# Patient Record
Sex: Male | Born: 1940
Health system: Southern US, Community
[De-identification: ages and names within clinical notes are randomized; demographics above are authoritative.]

## PROBLEM LIST (undated history)

## (undated) DIAGNOSIS — N200 Calculus of kidney: Secondary | ICD-10-CM

## (undated) DIAGNOSIS — G8929 Other chronic pain: Secondary | ICD-10-CM

## (undated) DIAGNOSIS — E785 Hyperlipidemia, unspecified: Secondary | ICD-10-CM

## (undated) DIAGNOSIS — R519 Headache, unspecified: Secondary | ICD-10-CM

## (undated) DIAGNOSIS — Z973 Presence of spectacles and contact lenses: Secondary | ICD-10-CM

## (undated) DIAGNOSIS — I2510 Atherosclerotic heart disease of native coronary artery without angina pectoris: Secondary | ICD-10-CM

## (undated) DIAGNOSIS — G20C Parkinsonism, unspecified: Secondary | ICD-10-CM

## (undated) DIAGNOSIS — M1990 Unspecified osteoarthritis, unspecified site: Secondary | ICD-10-CM

## (undated) DIAGNOSIS — K635 Polyp of colon: Secondary | ICD-10-CM

## (undated) DIAGNOSIS — R351 Nocturia: Secondary | ICD-10-CM

## (undated) DIAGNOSIS — G20A1 Parkinson's disease without dyskinesia, without mention of fluctuations: Secondary | ICD-10-CM

## (undated) DIAGNOSIS — Z974 Presence of external hearing-aid: Secondary | ICD-10-CM

## (undated) DIAGNOSIS — N400 Enlarged prostate without lower urinary tract symptoms: Secondary | ICD-10-CM

## (undated) DIAGNOSIS — R918 Other nonspecific abnormal finding of lung field: Secondary | ICD-10-CM

## (undated) DIAGNOSIS — N5231 Erectile dysfunction following radical prostatectomy: Secondary | ICD-10-CM

## (undated) DIAGNOSIS — Z8546 Personal history of malignant neoplasm of prostate: Secondary | ICD-10-CM

## (undated) DIAGNOSIS — R140 Abdominal distension (gaseous): Secondary | ICD-10-CM

## (undated) DIAGNOSIS — F482 Pseudobulbar affect: Secondary | ICD-10-CM

## (undated) DIAGNOSIS — K7581 Nonalcoholic steatohepatitis (NASH): Secondary | ICD-10-CM

## (undated) DIAGNOSIS — B409 Blastomycosis, unspecified: Secondary | ICD-10-CM

## (undated) DIAGNOSIS — I10 Essential (primary) hypertension: Secondary | ICD-10-CM

## (undated) DIAGNOSIS — K824 Cholesterolosis of gallbladder: Secondary | ICD-10-CM

## (undated) DIAGNOSIS — N201 Calculus of ureter: Secondary | ICD-10-CM

## (undated) DIAGNOSIS — N529 Male erectile dysfunction, unspecified: Secondary | ICD-10-CM

## (undated) DIAGNOSIS — N182 Chronic kidney disease, stage 2 (mild): Secondary | ICD-10-CM

## (undated) DIAGNOSIS — Z87442 Personal history of urinary calculi: Secondary | ICD-10-CM

## (undated) DIAGNOSIS — K5730 Diverticulosis of large intestine without perforation or abscess without bleeding: Secondary | ICD-10-CM

## (undated) DIAGNOSIS — K8020 Calculus of gallbladder without cholecystitis without obstruction: Secondary | ICD-10-CM

## (undated) DIAGNOSIS — N2889 Other specified disorders of kidney and ureter: Secondary | ICD-10-CM

## (undated) DIAGNOSIS — G3185 Corticobasal degeneration: Secondary | ICD-10-CM

## (undated) DIAGNOSIS — F339 Major depressive disorder, recurrent, unspecified: Secondary | ICD-10-CM

## (undated) DIAGNOSIS — H9319 Tinnitus, unspecified ear: Secondary | ICD-10-CM

## (undated) DIAGNOSIS — IMO0002 Reserved for concepts with insufficient information to code with codable children: Secondary | ICD-10-CM

## (undated) DIAGNOSIS — M4302 Spondylolysis, cervical region: Secondary | ICD-10-CM

## (undated) HISTORY — DX: Corticobasal degeneration: G31.85

## (undated) HISTORY — DX: Hyperlipidemia, unspecified: E78.5

## (undated) HISTORY — DX: Pseudobulbar affect: F48.2

## (undated) HISTORY — DX: Major depressive disorder, recurrent, unspecified: F33.9

## (undated) HISTORY — PX: LOBECTOMY: SHX5089

## (undated) HISTORY — DX: Parkinson's disease without dyskinesia, without mention of fluctuations: G20.A1

## (undated) HISTORY — DX: Calculus of ureter: N20.1

## (undated) HISTORY — DX: Benign prostatic hyperplasia without lower urinary tract symptoms: N40.0

## (undated) HISTORY — PX: CARDIAC CATHETERIZATION: SHX172

## (undated) HISTORY — DX: Cholesterolosis of gallbladder: K82.4

## (undated) HISTORY — DX: Other specified disorders of kidney and ureter: N28.89

## (undated) HISTORY — DX: Chronic kidney disease, stage 2 (mild): N18.2

## (undated) HISTORY — DX: Blastomycosis, unspecified: B40.9

## (undated) HISTORY — PX: BACK SURGERY: SHX140

## (undated) HISTORY — DX: Parkinsonism, unspecified: G20.C

## (undated) HISTORY — DX: Male erectile dysfunction, unspecified: N52.9

## (undated) HISTORY — DX: Diverticulosis of large intestine without perforation or abscess without bleeding: K57.30

## (undated) HISTORY — DX: Essential (primary) hypertension: I10

## (undated) HISTORY — DX: Nonalcoholic steatohepatitis (NASH): K75.81

## (undated) HISTORY — DX: Reserved for concepts with insufficient information to code with codable children: IMO0002

## (undated) HISTORY — DX: Personal history of malignant neoplasm of prostate: Z85.46

## (undated) HISTORY — DX: Calculus of gallbladder without cholecystitis without obstruction: K80.20

## (undated) HISTORY — PX: COLONOSCOPY: SHX174

## (undated) HISTORY — PX: CORONARY STENT PLACEMENT: SHX1402

## (undated) HISTORY — DX: Other nonspecific abnormal finding of lung field: R91.8

## (undated) HISTORY — DX: Calculus of kidney: N20.0

## (undated) HISTORY — DX: Atherosclerotic heart disease of native coronary artery without angina pectoris: I25.10

## (undated) HISTORY — DX: Unspecified osteoarthritis, unspecified site: M19.90

## (undated) HISTORY — DX: Polyp of colon: K63.5

---
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
## 1975-05-08 DIAGNOSIS — B409 Blastomycosis, unspecified: Secondary | ICD-10-CM

## 1975-05-08 HISTORY — DX: Blastomycosis, unspecified: B40.9

## 1976-05-07 DIAGNOSIS — Z8619 Personal history of other infectious and parasitic diseases: Secondary | ICD-10-CM

## 1976-05-07 HISTORY — PX: LUMBAR SPINE SURGERY: SHX701

## 1976-05-07 HISTORY — PX: LUNG LOBECTOMY: SHX167

## 1976-05-07 HISTORY — DX: Personal history of other infectious and parasitic diseases: Z86.19

## 1980-05-07 HISTORY — PX: ANTERIOR CERVICAL DECOMP/DISCECTOMY FUSION: SHX1161

## 1997-05-07 DIAGNOSIS — I2510 Atherosclerotic heart disease of native coronary artery without angina pectoris: Secondary | ICD-10-CM

## 1997-05-07 DIAGNOSIS — Z955 Presence of coronary angioplasty implant and graft: Secondary | ICD-10-CM

## 1997-05-07 HISTORY — DX: Presence of coronary angioplasty implant and graft: Z95.5

## 1997-05-07 HISTORY — DX: Atherosclerotic heart disease of native coronary artery without angina pectoris: I25.10

## 1997-05-07 HISTORY — PX: CORONARY STENT PLACEMENT: SHX1402

## 1997-11-26 ENCOUNTER — Ambulatory Visit (HOSPITAL_COMMUNITY): Admission: RE | Admit: 1997-11-26 | Discharge: 1997-11-27 | Payer: Self-pay | Admitting: Cardiology

## 2000-02-26 ENCOUNTER — Inpatient Hospital Stay (HOSPITAL_COMMUNITY): Admission: EM | Admit: 2000-02-26 | Discharge: 2000-02-28 | Payer: Self-pay | Admitting: Emergency Medicine

## 2000-02-26 ENCOUNTER — Encounter: Payer: Self-pay | Admitting: Emergency Medicine

## 2000-02-27 HISTORY — PX: CARDIAC CATHETERIZATION: SHX172

## 2002-02-26 ENCOUNTER — Encounter: Payer: Self-pay | Admitting: Family Medicine

## 2003-01-12 ENCOUNTER — Encounter: Payer: Self-pay | Admitting: Family Medicine

## 2003-02-11 ENCOUNTER — Encounter: Payer: Self-pay | Admitting: Family Medicine

## 2005-05-07 HISTORY — PX: ROTATOR CUFF REPAIR: SHX139

## 2007-12-22 ENCOUNTER — Ambulatory Visit: Payer: Self-pay | Admitting: Cardiology

## 2007-12-22 ENCOUNTER — Inpatient Hospital Stay (HOSPITAL_COMMUNITY): Admission: EM | Admit: 2007-12-22 | Discharge: 2007-12-24 | Payer: Self-pay | Admitting: Emergency Medicine

## 2007-12-22 DIAGNOSIS — I2510 Atherosclerotic heart disease of native coronary artery without angina pectoris: Secondary | ICD-10-CM

## 2007-12-22 HISTORY — DX: Atherosclerotic heart disease of native coronary artery without angina pectoris: I25.10

## 2007-12-23 HISTORY — PX: CORONARY ANGIOPLASTY WITH STENT PLACEMENT: SHX49

## 2008-01-07 ENCOUNTER — Ambulatory Visit: Payer: Self-pay | Admitting: Cardiology

## 2008-04-14 ENCOUNTER — Ambulatory Visit: Payer: Self-pay | Admitting: Cardiology

## 2008-11-06 DIAGNOSIS — I2510 Atherosclerotic heart disease of native coronary artery without angina pectoris: Secondary | ICD-10-CM

## 2008-11-06 DIAGNOSIS — E785 Hyperlipidemia, unspecified: Secondary | ICD-10-CM | POA: Insufficient documentation

## 2008-11-06 DIAGNOSIS — IMO0002 Reserved for concepts with insufficient information to code with codable children: Secondary | ICD-10-CM

## 2008-11-10 ENCOUNTER — Ambulatory Visit: Payer: Self-pay | Admitting: Cardiology

## 2009-07-26 ENCOUNTER — Encounter: Payer: Self-pay | Admitting: Cardiology

## 2009-07-26 ENCOUNTER — Encounter: Payer: Self-pay | Admitting: Family Medicine

## 2009-08-17 ENCOUNTER — Ambulatory Visit (HOSPITAL_COMMUNITY): Admission: RE | Admit: 2009-08-17 | Discharge: 2009-08-17 | Payer: Self-pay | Admitting: Orthopedic Surgery

## 2009-12-21 ENCOUNTER — Encounter: Payer: Self-pay | Admitting: Cardiology

## 2010-01-11 ENCOUNTER — Ambulatory Visit: Payer: Self-pay | Admitting: Cardiology

## 2010-02-01 ENCOUNTER — Encounter: Payer: Self-pay | Admitting: Cardiology

## 2010-02-27 ENCOUNTER — Encounter: Payer: Self-pay | Admitting: Cardiology

## 2010-04-11 ENCOUNTER — Ambulatory Visit: Payer: Self-pay | Admitting: Family Medicine

## 2010-04-11 ENCOUNTER — Telehealth: Payer: Self-pay | Admitting: Family Medicine

## 2010-05-04 ENCOUNTER — Encounter: Payer: Self-pay | Admitting: Family Medicine

## 2010-05-07 HISTORY — PX: CARDIOVASCULAR STRESS TEST: SHX262

## 2010-05-15 ENCOUNTER — Encounter: Payer: Self-pay | Admitting: Family Medicine

## 2010-05-28 ENCOUNTER — Encounter: Payer: Self-pay | Admitting: Orthopedic Surgery

## 2010-06-06 NOTE — Letter (Signed)
Summary: House, [place] [address] [place]   [place], [place] [address]   Phone: [phone]  Fax: [phone]         February 27, 2010 MRN: [id number]   [facility] [place], [place]  [address]   Dear Mr. FALLER,  We have reviewed your cholesterol results.  They are as follows:     Total Cholesterol:   163  (Desirable: less than 200)       HDL  Cholesterol:     44  (Desirable: greater than 40 for men and 50 for women)       LDL Cholesterol:       79  (Desirable: less than 100 for low risk and less than 70 for moderate to high risk)       Triglycerides:       200  (Desirable: less than 150)  Our recommendations include:  Triglycerides are high.  Limit the fats in your diet as much as possible   Call our office at the number listed above if you have any questions.  Lowering your LDL cholesterol is important, but it is only one of a large number of "risk factors" that may indicate that you are at risk for heart disease, stroke or other complications of hardening of the arteries.  Other risk factors include:   A.  Cigarette Smoking* B.  High Blood Pressure* C.  Obesity* D.   Low HDL Cholesterol (see yours above)* E.   Diabetes Mellitus (higher risk if your is uncontrolled) F.  Family history of premature heart disease G.  Previous history of stroke or cardiovascular disease    *These are risk factors YOU HAVE CONTROL OVER.  For more information, visit .      There is now evidence that lowering the TOTAL CHOLESTEROL AND LDL CHOLESTEROL can reduce the risk of heart disease.  The American Heart Association recommends the following guidelines for the treatment of elevated cholesterol:  1.  If there is now current heart disease and less than two risk factors, TOTAL CHOLESTEROL should be less than 200 and LDL CHOLESTEROL should be less than 100. 2.  If there is current heart disease or two or more risk factors,  TOTAL CHOLESTEROL should be less than 200 and LDL CHOLESTEROL should be less than 70.  A diet low in cholesterol, saturated fat, and calories is the cornerstone of treatment for elevated cholesterol.  Cessation of smoking and exercise are also important in the management of elevated cholesterol and preventing vascular disease.  Studies have shown that 30 to 60 minutes of physical activity most days can help lower blood pressure, lower cholesterol, and keep your weight at a healthy level.  Drug therapy is used when cholesterol levels do not respond to therapeutic lifestyle changes (smoking cessation, diet, and exercise) and remains unacceptably high.  If medication is started, it is important to have you levels checked periodically to evaluate the need for further treatment options.    Thank you,     Vic Ripper, RN for Dr. Minus Breeding [facility] Team

## 2010-06-06 NOTE — Assessment & Plan Note (Signed)
Summary: [facility]  Medications Added PLAVIX 75 MG TABS (CLOPIDOGREL BISULFATE) 1 by mouth daliy LIPITOR 10 MG TABS (ATORVASTATIN CALCIUM) 1 by mouth daily CELEBREX 200 MG CAPS (CELECOXIB) as needed NIACIN 500 MG TABS (NIACIN) 1 by mouth daily CO Q-10 30 MG  CAPS (COENZYME Q10) 1 by mouth daily ASPIRIN 325 MG  TABS (ASPIRIN) 1 by mouth daily MULTIVITAMINS   TABS (MULTIPLE VITAMIN) 1 by mouth daily      Allergies Added: NKDA  Visit Type:  Follow-up Primary Provider:  Dr. Rowe Pavy  CC:  CAD.  History of Present Illness: The patient presents for one-year followup. Since I last saw him he has done quite well. He was walking 3 miles a day until he got some recent plantar fasciitis. 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 With this he denies any chest discomfort, neck or arm discomfort. He is not having any palpitations, presyncope or syncope. He is not having any PND or orthopnea or other shortness of breath. I reviewed lipids from March of this year and his LDL was 72 with an HDL of 46. He has had more recent labs but I have not seen these. He is tolerating the meds as listed.  Current Medications (verified): 1)  Plavix 75 Mg Tabs (Clopidogrel Bisulfate) .Marland Kitchen.. 1 By Mouth Daliy 2)  Lipitor 10 Mg Tabs (Atorvastatin Calcium) .Marland Kitchen.. 1 By Mouth Daily 3)  Celebrex 200 Mg Caps (Celecoxib) .... As Needed 4)  Niacin 500 Mg Tabs (Niacin) .Marland Kitchen.. 1 By Mouth Daily 5)  Co Q-10 30 Mg  Caps (Coenzyme Q10) .Marland Kitchen.. 1 By Mouth Daily 6)  Aspirin 325 Mg  Tabs (Aspirin) .Marland Kitchen.. 1 By Mouth Daily 7)  Multivitamins   Tabs (Multiple Vitamin) .Marland Kitchen.. 1 By Mouth Daily  Allergies (verified): No Known Drug Allergies  Past History:  Past Medical History:  Coronary artery disease (December 22, 2007, chest   pain.  Cardiac cath demonstrated 95% stenosis in the proximal portion of   the LAD.  There was a proximal stent with 10-20% restenosis.  The mid   LAD had 70% stenosis.  The diagonal had  80-90% stenosis.  The circumflex   had luminal irregularities.  Right coronary was free of disease.  He had   an EF of 65%.  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   Vital Signs:  Patient profile:   70 year old male Height:      69 inches Weight:      177 pounds BMI:     26.23 Pulse rate:   60 / minute Resp:     16 per minute BP sitting:   122 / 92  (right arm)  Vitals Entered By: Levora Angel, CNA (January 11, 2010 10:51 AM)  Physical Exam  General:  Well developed, well nourished, in no acute distress. Head:  normocephalic and atraumatic Eyes:  PERRLA/EOM intact; conjunctiva and lids normal. Mouth:  Teeth, gums and palate normal. Oral mucosa normal. Neck:  Neck supple, no JVD. No masses, thyromegaly or abnormal cervical nodes. Chest Wall:  no deformities or breast masses noted Lungs:  Clear bilaterally to auscultation and percussion. Heart:  Non-displaced PMI, chest non-tender; regular rate  and rhythm, S1, S2 without murmurs, rubs or gallops. Carotid upstroke normal, no bruit. Normal abdominal aortic size, no bruits. Femorals normal pulses, no bruits. Pedals normal pulses. No edema, no varicosities. Abdomen:  Bowel sounds positive; abdomen soft and non-tender without masses, organomegaly, or hernias noted. No hepatosplenomegaly. Msk:  Back normal, normal gait. Muscle strength and tone normal. Extremities:  No clubbing or cyanosis. Neurologic:  Alert and oriented x 3. Skin:  Intact without lesions or rashes. Cervical Nodes:  no significant adenopathy Inguinal Nodes:  no significant adenopathy Psych:  Normal affect.   EKG  Procedure date:  01/11/2010  Findings:      Sinus rhythm, rate 62, axis within  normal limits, intervals within normal limits, no acute ST-T wave changes  Impression & Recommendations:  Problem # 1:  CAD (ICD-414.00)  Tt has no new symptoms. He will continue with aggressive risk reduction.  Orders: EKG w/ Interpretation (93000)  Problem # 2:  DYSLIPIDEMIA (ICD-272.4)  I will look for his most recent lab results. The ones from March were excellent. A make changes as necessary.  His updated medication list for this problem includes:    Lipitor 10 Mg Tabs (Atorvastatin calcium) .Marland Kitchen... 1 by mouth daily    Niacin 500 Mg Tabs (Niacin) .Marland Kitchen... 1 by mouth daily  Patient Instructions: 1)  Your physician recommends that you schedule a follow-up appointment in: 12 months in [place] 2)  Your physician recommends that you continue on your current medications as directed. Please refer to the Current Medication list given to you today. Prescriptions: PLAVIX 75 MG TABS (CLOPIDOGREL BISULFATE) 1 by mouth daliy  #90 x 3   Entered by:   Sim Boast, RN   Authorized by:   Minus Breeding, MD, [facility]   Signed by:   Sim Boast, RN on 01/11/2010   Method used:   Electronically to        [facility]* (retail)       [address]       [place], [place]  [address]       Ph: [phone]       Fax: [phone]   RxID:   [id number]

## 2010-06-06 NOTE — Progress Notes (Signed)
Summary: Medical Records Request Dr Rowe Pavy  Phone Note [facility] Call Call back at [phone]   Call placed by: Titus Dubin,  April 11, 2010 9:14 AM Reason for Call: Get patient information Summary of Call: Faxed request for medical records from [facility] Dr Rowe Pavy [phone] fax  Initial call taken by: Titus Dubin,  April 11, 2010 9:15 AM

## 2010-06-06 NOTE — Assessment & Plan Note (Signed)
Summary: PHYSICAL/[facility]FW   Vital Signs:  Patient profile:   70 year old male Height:      69 inches (175.26 cm) Weight:      179.75 pounds (81.70 kg) O2 Sat:      96 % on Room air Temp:     97.8 degrees F (36.56 degrees C) oral Pulse rate:   66 / minute BP sitting:   139 / 90  (right arm) Cuff size:   regular  Vitals Entered By: Gardenia Phlegm RMA (April 11, 2010 8:23 AM)  O2 Flow:  Room air CC: Establish new patient/ physical/ CF Is Patient Diabetic? No   History of Present Illness: 70 y/o WM here to establish care.  Used to see Dr. Rowe Pavy in [place] but saw someone at [facility] in [facility] once, as well. No acute complaints. CAD s/p PCI history reviewed. Compliant with meds, walks 3 mi per day without CP/SOB. Asks about shingles vaccine today.  Has never had shingles.  Preventive Screening-Counseling & Management  Alcohol-Tobacco     Smoking Status: quit > 6 months     Packs/Day: 0.75     Year Started: 1962     Year Quit: 1983  Allergies (verified): No Known Drug Allergies  Past History:  Past medical, surgical, family and social histories (including risk factors) reviewed, and no changes noted (except as noted below).  Past Medical History: Reviewed history from 01/11/2010 and no changes required.  Coronary artery disease (December 22, 2007, chest   pain.  Cardiac cath demonstrated 95% stenosis in the proximal portion of   the LAD.  There was a proximal stent with 10-20% restenosis.  The mid   LAD had 70% stenosis.  The diagonal had 80-90% stenosis.  The circumflex   had luminal irregularities.  Right coronary was free of disease.  He had   an EF of 65%.  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 Screening colonoscopy (Dr. Rowe Pavy at [facility] in [place]) 2008: normal per pt report.  Family  History: Reviewed history from 11/06/2008 and no changes required.  Mother died of cervical cancer in her 58s.  Father died   of hepatic cirrhosis at 49.  He has 2 brothers and 2 sisters are alive   and well.  He has multiple half brothers who are alive and well.      Social History: Reviewed history from 11/06/2008 and no changes required.  He lives in [place], [place], with his wife.  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  He has about an 18-pack-year history of tobacco abuse,   quitting in 1977.  He drinks about 1 beer every 6 months, and otherwise   does not use alcohol.  He denies any drug use.  He has been walking   daily for the past 4 months as outlined above.     Smoking Status:  quit > 6 months Packs/Day:  0.75  Physical Exam  General:  VS: noted, all normal. Gen: Alert, well appearing, oriented x 4. Chest: symmetric expansion, with nonlabored respirations.  Clear and equal breath sounds in all lung fields.   CV: RRR, no m/r/g.  Peripheral pulses 2+/symmetric. EXT: no clubbing, cyanosis, or edema.     Impression & Recommendations:  Problem # 1:  CAD (ICD-414.00) New patient. He is stable, no changes required today. He'll return in late Feb 2012 for routine AST/ALT since he is on statin. Will obtain old primary care records. Return for CPE and labs in 39mo Zostavax Rx given today to take to local pharmacy.  Problem # 2:  DYSLIPIDEMIA (ICD-272.4) Continue current exercise, diet, med. Will review old records/labs.    His updated medication list for this problem includes:    Lipitor 10 Mg Tabs (Atorvastatin calcium) ..Marland Kitchen.. 1 by mouth daily    Niacin 500 Mg Tabs (Niacin) ..Marland Kitchen.. 1 by mouth daily  Complete Medication List: 1)  Plavix 75 Mg Tabs (Clopidogrel bisulfate) ..Marland Kitchen. 1 by mouth daliy 2)  Lipitor 10 Mg Tabs (Atorvastatin calcium) ..Marland Kitchen. 1 by mouth daily 3)  Celebrex 200 Mg Caps (Celecoxib) .... As  needed 4)  Niacin 500 Mg Tabs (Niacin) ..Marland Kitchen. 1 by mouth daily 5)  Co Q-10 30 Mg Caps (Coenzyme q10) ..Marland Kitchen. 1 by mouth daily 6)  Aspirin 325 Mg Tabs (Aspirin) ..Marland Kitchen. 1 by mouth daily 7)  Multivitamins Tabs (Multiple vitamin) ..Marland Kitchen. 1 by mouth daily 8)  Zostavax   Patient Instructions: 1)  Schedule lab visit (here) for late February 2012. 2)  Schedule morning CPE with me in 6 months (fasting). 3)  [facility] Prescriptions: ZOSTAVAX   #1 x 0   Entered and Authorized by:   PKelle DartingM.D.   Signed by:   PKelle DartingM.D. on 04/11/2010   Method used:   Print then Give to Patient   RxID:   [id number]   Orders Added: 1)  New Patient Level II [99202]    Preventive Care Screening  Last Flu Shot:    Date:  02/04/2010    Results:  historical   Last Pneumovax:    Date:  05/08/2007    Results:  historical

## 2010-06-08 NOTE — Miscellaneous (Signed)
  Clinical Lists Changes  Observations: Added new observation of PRIMARY MD: Kelle Darting M.D. (05/15/2010 15:56) Added new observation of PNEUMOVAX: given (05/08/2007 15:58) Added new observation of TD BOOSTER: Historical (05/07/2006 15:58)        Preventive Care Screening  Last Pneumovax:    Date:  05/08/2007    Results:  given  Last Tetanus Booster:    Date:  05/07/2006    Results:  Historical

## 2010-06-08 NOTE — Procedures (Signed)
Summary: Leafy Half MD  Leafy Half MD   Imported By: Bubba Hales 05/15/2010 10:36:54  _____________________________________________________________________  External Attachment:    Type:   Image     Comment:   External Document

## 2010-06-08 NOTE — Miscellaneous (Signed)
  Clinical Lists Changes  Observations: Added new observation of PAST MED HX:  Coronary artery disease (December 22, 2007, chest   pain.  Cardiac cath demonstrated 95% stenosis in the proximal portion of   the LAD.  There was a proximal stent with 10-20% restenosis.  The mid   LAD had 70% stenosis.  The diagonal had 80-90% stenosis.  The circumflex   had luminal irregularities.  Right coronary was free of disease.  He had   an EF of 65%.  He subsequently had drug-eluting stent in the proximal   mid LAD), dyslipidemia, degenerative disk disease COLON POLYPS; most recent colonoscopy 2008 (Dr. Rowe Pavy) normal per pt report Sensorineural hearing loss (2004, Dr. 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  (05/04/2010 15:15) Added new observation of PRIMARY MD: Kelle Darting M.D. (05/04/2010 15:15)       Past History:  Past Medical History:  Coronary artery disease (December 22, 2007, chest   pain.  Cardiac cath demonstrated 95% stenosis in the proximal portion of   the LAD.  There was a proximal stent with 10-20% restenosis.  The mid   LAD had 70% stenosis.  The diagonal had 80-90% stenosis.  The circumflex   had luminal irregularities.  Right coronary was free of disease.  He had   an EF of 65%.  He subsequently had drug-eluting stent in the proximal   mid LAD), dyslipidemia, degenerative disk disease COLON POLYPS; most recent colonoscopy 2008 (Dr. 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   Allergies: No Known Drug  Allergies

## 2010-06-08 NOTE — Letter (Signed)
Summary: William Montgomery at [facility] [place] By: Bubba Hales 05/17/2010 09:31:33  _____________________________________________________________________  External Attachment:    Type:   Image     Comment:   External Document

## 2010-06-08 NOTE — Letter (Signed)
Summary: [facility]   Imported By: Bubba Hales 05/15/2010 10:35:22  _____________________________________________________________________  External Attachment:    Type:   Image     Comment:   External Document

## 2010-06-28 ENCOUNTER — Encounter: Payer: Self-pay | Admitting: Family Medicine

## 2010-07-06 ENCOUNTER — Encounter: Payer: Self-pay | Admitting: Family Medicine

## 2010-07-25 NOTE — Consult Note (Signed)
Summary: [facility]  [facility]   Imported By: Bubba Hales 07/21/2010 07:25:57  _____________________________________________________________________  External Attachment:    Type:   Image     Comment:   External Document

## 2010-07-27 ENCOUNTER — Telehealth: Payer: Self-pay | Admitting: Family Medicine

## 2010-07-27 NOTE — Telephone Encounter (Signed)
Obtain hepatic panel.  Dx is 272.4--hyperlipidemia.

## 2010-07-28 ENCOUNTER — Telehealth: Payer: Self-pay | Admitting: Family Medicine

## 2010-07-28 ENCOUNTER — Other Ambulatory Visit (INDEPENDENT_AMBULATORY_CARE_PROVIDER_SITE_OTHER): Payer: Self-pay | Admitting: Family Medicine

## 2010-07-28 DIAGNOSIS — E785 Hyperlipidemia, unspecified: Secondary | ICD-10-CM

## 2010-07-28 LAB — HEPATIC FUNCTION PANEL
AST: 39 U/L — ABNORMAL HIGH (ref 0–37)
Alkaline Phosphatase: 72 U/L (ref 39–117)
Bilirubin, Direct: 0.2 mg/dL (ref 0.0–0.3)
Total Bilirubin: 0.9 mg/dL (ref 0.3–1.2)

## 2010-07-28 NOTE — Telephone Encounter (Signed)
Pls notify: his liver tests were barely elevated.  I want to repeat them at his next follow up visit, which should be in [place].  Continue all current meds.

## 2010-07-31 NOTE — Telephone Encounter (Signed)
I have attempted to contact this patient by phone with the following results: left message to return my call on answering machine.

## 2010-08-07 NOTE — Telephone Encounter (Signed)
Notified pt of lab results.  Pt will have checked at next visit.  Pt states he received a bill for services.  Advised pt he does not have insurance on file and he should call the number on his bill and notify them that he does have insurance.  Pt agreeable, but upset that his information is not in the system although he has given insurance cards.

## 2010-09-19 NOTE — Assessment & Plan Note (Signed)
[place]                            CARDIOLOGY OFFICE NOTE   William Montgomery, William Montgomery                      MRN:          [id number]  DATE:04/14/2008                            DOB:          [birth year]    PRIMARY CARE PHYSICIAN:  Sherril Cong, MD   REASON FOR PRESENTATION:  Evaluate the patient with coronary disease.   HISTORY OF PRESENT ILLNESS:  The patient is a pleasant 70 year old with  coronary disease as described below.  Since I last saw him, he stopped  taking Advicor.  I had started that to try to raise his HDL.  However,  he just could not tolerate it.  He is tolerating some over-the-counter  niacin.  He is taking 500 to 1000 mg everyday.  He does get some  flushing, but this was tolerable.  He is not walking as much as he was,  but he is remaining active remodeling kitchens and otherwise.  He is not  having any chest discomfort, neck or arm discomfort.  He is having no  shortness of breath.  He is having no palpitation, presyncope, or  syncope.   PAST MEDICAL HISTORY:  Coronary artery disease (December 22, 2007, chest  pain.  Cardiac cath demonstrated 95% stenosis in the proximal portion of  the LAD.  There was a proximal stent with 10-20% restenosis.  The mid  LAD had 70% stenosis.  The diagonal had 80-90% stenosis.  The circumflex  had luminal irregularities.  Right coronary was free of disease.  He had  an EF of 65%.  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   ALLERGIES:  None.   MEDICATIONS:  1. Aspirin 325 mg daily.  2. Plavix 75 mg daily.  3. Lipitor 10 mg at bedtime.  4. Multivitamin.  5. Coenzyme Q10.  6. Niacin 500 mg daily.   REVIEW OF SYSTEMS:  As stated in the HPI and otherwise negative for  other  systems.   PHYSICAL EXAMINATION:  GENERAL:  The patient is pleasant and in no  distress.  VITAL SIGNS:  Blood pressure 130/84, heart rate 60 and regular, weight  181 pounds, and body mass index 27.  NECK:  No jugular venous distention at 45 degrees, carotid upstroke  brisk and symmetric, no bruits, no thyromegaly.  CHEST:  Well-healed surgical scars.  HEART:  PMI nondisplaced or sustained.  S1 and S2 within normal limits.  No S3, no S4, no clicks, no rubs, no murmurs.  LUNGS:  Clear to auscultation bilaterally.  ABDOMEN:  Flat, positive bowel sounds, normal in frequency and pitch; no  bruits, no rebound, no guarding, and no midline pulsatile mass.  No  organomegaly.  SKIN:  No rashes, no nodules.  EXTREMITIES:  Pulses 2+ throughout.  No edema, no cyanosis, no clubbing.  NEURO:  Grossly intact throughout.   ASSESSMENT AND PLAN:  1. Coronary artery disease.  The patient is having  no new symptoms      related to this.  No further cardiovascular testing is suggested.  2. Dyslipidemia.  We are going to get a lipid profile checked soon as      he is now on the niacin plus Lipitor which he restarted.  He can up      his over-the-counter niacin if his HDL is not higher.  Also, he      needs to get back to walking which he is not doing as much.  3. Followup.  I will see the patient back in 6 months or sooner if      needed.     Minus Breeding, MD, [facility]  Electronically Signed    JH/MedQ  DD: 04/14/2008  DT: 04/15/2008  Job #: [id number]   cc:   Sherril Cong, MD

## 2010-09-19 NOTE — Discharge Summary (Signed)
NAME:  William Montgomery, William Montgomery NO.:  [id number]   MEDICAL RECORD NO.:  [id number]          PATIENT TYPE:  INP   LOCATION:  6525                         FACILITY:  [facility]   PHYSICIAN:  Minus Breeding, MD, FACCDATE OF BIRTH:  May 05, 1941   DATE OF ADMISSION:  12/22/2007  DATE OF DISCHARGE:  12/24/2007                               DISCHARGE SUMMARY   PRIMARY CARDIOLOGIST:  Minus Breeding, MD, [facility], [place].   PRIMARY CARE William Montgomery.   DISCHARGE DIAGNOSIS:  Unstable angina.   SECONDARY DIAGNOSES:  1. Coronary artery disease, status post successful percutaneous      coronary intervention and stenting of the left anterior descending      with 2 PROMUS drug-eluting stents this admission.  2. Hyperlipidemia.  3. Degenerative disk disease status post neck surgery.  4. History of blastomycosis of the right upper lobe in 1977, status      post right upper lobectomy.   ALLERGIES:  No known drug allergies.   PROCEDURES:  Left heart cardiac catheterization with successful PCI and  stenting of the proximal LAD with placement of 3.0 x 12 mm PROMUS drug-  eluting stent and 2.75 x 18 mm PROMUS drug-eluting stent.  EF was 65%  with normal LVEF, without regional wall motion abnormalities.   HISTORY OF PRESENT ILLNESS:  A 70 year old married Caucasian male with  prior history of CAD status post bare-metal stenting in LAD 1999 who was  in his usual state of health approximately 4 months ago when he began to  experience exertional right axillary arm pain that occurred when walking  up hills or exacerbation on a flat surface.  Symptoms were typically  moved to the left shoulder and then subsequently to the mid chest.  Symptoms often associated with dyspnea lasting 5-10 minutes and resolved  with decreased pace or rest.  Over the past 2 weeks prior to admission,  the patient noted progressively worsening symptoms, worse with less  activity, and even had rest pain  with globus sensation on the evening of  December 21, 2007.  He saw his primary care William Montgomery on December 22, 2007,  and was sent to the [facility] ED for further evaluation.  He was pain  free in the ED and was admitted for evaluation.   HOSPITAL COURSE:  The patient was ruled out for MI.  He is status post  left hear cardiac catheterization which took place on December 23, 2007,  revealing 95% stenosis in the proximal LAD with 95% stenosis in a small  diagonal.  The previously placed LAD stent was widely patent.  The  patient had, otherwise, nonobstructive disease with EF 65% without  regional wall motion abnormalities.  Attention was then turned to the  LAD which was successfully performed with 2 PROMUS drug-eluting stents  as above.  The patient tolerated this procedure well.  Postprocedure  cardiac markers have remained normal.  ECG has shown no acute changes.  The patient will be discharged home today in good condition.   DISCHARGE LABS:  Hemoglobin 15.4, hematocrit 44.1, WBC 9.5, platelets  176,  and MCV 94.6.  Sodium 138, potassium 4.0, chloride 103, CO2 27, BUN  9, creatinine 1.17, glucose 95, and INR 1.1.  Total bilirubin 0.9,  alkaline phosphatase 71, AST 33, ALT 47, and albumin 4.0.  Cardiac  markers negative x3.  Total cholesterol 133, triglycerides 147, HDL 34,  LDL 70, calcium 9.0, and magnesium 2.2.  Fecal occult blood was  negative.  TSH 1.796.   DISPOSITION:  The patient is being discharged home today in good  condition.   FOLLOWUP PLANS AND APPOINTMENTS:  He has follow up with Dr. Percival Montgomery in  [place], on January 07, 2008, at 1:30 p.m.  He was asked to follow up  with Dr. Rowe Montgomery as previously scheduled.   DISCHARGE MEDICATIONS:  1. Aspirin 325 mg daily.  2. Plavix 75 mg daily.  3. Lipitor 10 mg nightly.  4. Multivitamin 1 daily.  5. Coenzyme Q10, 200 mg daily.  6. Nitroglycerin 0.4 mg sublingual p.r.n. chest pain.   OUTSTANDING LABORATORY STUDIES:  None.    DURATION OF DISCHARGE/ENCOUNTER:  40 minutes including physician time.      William Montgomery, ANP      Minus Breeding, MD, [facility]  Electronically Signed    CB/MEDQ  D:  12/24/2007  T:  12/25/2007  Job:  [id number]   cc:   William Cong, MD

## 2010-09-19 NOTE — Assessment & Plan Note (Signed)
William Montgomery                            CARDIOLOGY OFFICE NOTE   ILEY, William                      MRN:          [id number]  DATE:11/10/2008                            DOB:          01-Aug-1940    PRIMARY CARE PHYSICIAN:  Sherril Cong, MD   REASON FOR PRESENTATION:  Evaluate the patient with coronary disease.   HISTORY OF PRESENT ILLNESS:  The patient presents for followup of his  known coronary disease.  Since I last saw him, he has been taking over-  the-counter niacin and I have seen followup labs.  His HDL is actually  up.  His LDL was in reasonable level.  His liver enzymes are very  slightly elevated, however.  He has had no cardiovascular complaints.  He has not had any chest discomfort, neck or arm discomfort.  He has not  had any palpitation, presyncope, or syncope.  He may get some discomfort  in his right axilla when he climbs up a steep incline in the heat but it  goes away very quickly.   PAST MEDICAL HISTORY:  1. Coronary artery disease (see the note from April 14, 2008).  2. Dyslipidemia.  3. Degenerative disk disease.  4. Neck surgery.  5. Blastomycosis in the right upper lobe in 1977.  6. Status post right upper lobectomy.  7. Remote tobacco abuse, quitting in 1977.   ALLERGIES:  None.   MEDICATIONS:  1. Aspirin 325 mg daily.  2. Plavix 75 mg daily.  3. Lipitor 10 mg daily.  4. Multivitamin.  5. Coenzyme Q.  6. Niacin 500 mg over the counter.   REVIEW OF SYSTEMS:  The patient is complaining of back spasm in his  right back and right lower flank, similar to problems he has had in the  past.  Otherwise, review of systems is as stated in the HPI and negative  for all other systems.   PHYSICAL EXAMINATION:  GENERAL:  The patient is pleasant in no distress.  VITAL SIGNS:  Blood pressure 126/66, heart rate 47 and regular, weight  179 pounds, body mass index 26.  NECK:  No jugular venous distention at 45 degrees,  carotid upstroke  brisk and symmetric, no bruits, no thyromegaly.  CHEST:  Well-healed surgical scars.  HEART:  PMI not displaced or sustained, S1 and S2 within normal limits,  no S3, no S4, no clicks, no rubs, no murmurs.  LUNGS:  Clear to auscultation bilaterally.  ABDOMEN:  Flat, positive bowel sounds normal in frequency and pitch, no  bruits, no rebound, no guarding, no midline pulsatile mass, no  organomegaly.  SKIN:  No rashes, no nodules.  EXTREMITIES:  Pulse 2+, no edema.  NEUROLOGIC:  Grossly intact.   EKG:  Sinus bradycardia, rate 47, axis within normal limits, intervals  within normal limits.  No acute ST-wave changes.   ASSESSMENT AND PLAN:  1. Coronary disease.  The patient is having no new symptoms.  No      further cardiovascular testing is suggested.  He will continue with      aggressive risk reduction.  2.  Mildly elevated liver enzymes.  He knows to have this followed up      and has blood work planned in August with lipid and liver.  3. Back discomfort.  The patient is having some back spasm.  Dr. Rowe Pavy      is out of town.  I have not taken the liberty of giving him a      limited dose of Robaxin for management of this.  4. Followup.  I will see the patient in 1 year or sooner if needed.     Minus Breeding, MD, [facility]  Electronically Signed    JH/MedQ  DD: 11/10/2008  DT: 11/11/2008  Job #: [id number]   cc:   Sherril Cong, MD

## 2010-09-19 NOTE — Cardiovascular Report (Signed)
NAME:  William Montgomery, COMMON NO.:  [id number]   MEDICAL RECORD NO.:  [id number]          PATIENT TYPE:  INP   LOCATION:  7893                         FACILITY:  [facility]   PHYSICIAN:  Lauree Chandler, MDDATE OF BIRTH:  10/16/1940   DATE OF PROCEDURE:  12/23/2007  DATE OF DISCHARGE:                            CARDIAC CATHETERIZATION   PROCEDURE:  1. Left heart catheterization.  2. Selective coronary angiography.  3. Left ventricular angiogram.  4. Percutaneous coronary intervention with placement of PROMUS drug-      eluting stents in the proximal left anterior descending artery and      the mid left anterior descending artery.   OPERATOR:  Lauree Chandler, MD   INDICATIONS:  This is a 69 year old Caucasian male with a history of  known coronary artery disease who is admitted with unstable angina.  Cardiac enzymes were negative and there were no ischemic EKG changes.   FINDINGS:  1. Left main coronary artery has no disease.  2. The left anterior descending has a 95% lesion in the proximal      portion.  The stented segment of the proximal LAD shows only      minimal in-stent restenosis of 10-20%.  The mid LAD has a hazy 70%      stenosis distal to a large diagonal branch.  The first diagonal has      a 80-90% lesion in the midportion of the vessel that is noted to be      chronic over the last 10 years.  3. Circumflex has luminal irregularities only.  It gives rise to a      large obtuse marginal branch that has luminal irregularities.  4. The right coronary artery is a small vessel that is codominant and      has no disease.  5. Left ventricular angiogram shows an ejection fraction of 65% with      normal systolic function.  Left ventricular pressure was 100/5 with      an end-diastolic pressure of 11.   PROCEDURE IN DETAIL:  The patient was brought to the Heart  Catheterization Laboratory after signing informed consent.  The right  groin was prepped and  draped in a sterile fashion.  The right femoral  artery was engaged with a 5-French sheath for the diagnostic coronary  angiogram.  Standard diagnostic catheters were used.  The JL-4 was used  for the left coronary system and the JR-4 was used for the right  coronary system.  A 5-French pigtail catheter was inserted across the  aortic valve into the left ventricle.  The left ventricular angiogram  was performed.  At this point, we elected to proceed with percutaneous  coronary intervention of the LAD.  The sheath was exchanged for a 6-  French sheath in the right femoral artery.  A 6-French XB LAD 3.5 guide  was used to engage the left main coronary artery.  A Cougar  intracoronary wire was advanced down the LAD.  A 2.5 x 12 mm apex  balloon was used for predilatation of the proximal LAD stenosis.  A 3.0  x 12 mm PROMUS drug-eluting stent was then placed in the proximal LAD.  A 3.25 x 9 mm noncompliant balloon was used for post-dilatation of the  lesion.  At this time, I decided to intervene on the mid LAD lesion as  well.  A 2.5 x 23m  apex balloon was advanced to the lesion and used  for predilatation.  A PROMUS 2.75 x 18 mm stent was deployed in the mid  LAD.  A 3.0 x 12 mm Voyager noncompliant balloon was used for post-  dilatation of the lesion.  The pre-stenosis of the proximal lesion was  90% and was 0% following the intervention.  The pre-stenosis of the mid  lesion was 70% with haziness and following the intervention was 0% with  no haziness.  There was TIMI 3 flow down the vessel prior to  intervention and following the intervention.  Angiomax was used for  anticoagulation.  The patient was given 600 mg of Plavix on the cath lab  table.  He tolerated the procedure well.  An Angio-Seal arterial closure  device was used to close the right femoral arteriotomy.  The patient was  taken to recovery in stable condition.   IMPRESSION:  1. Single-vessel coronary artery disease.  2.  Normal LV function   RECOMMENDATIONS:  This patient should be continued on aspirin, statin,  beta-blocker, and Plavix therapy for at least 1 year.      CLauree Chandler MD  Electronically Signed     CM/MEDQ  D:  12/23/2007  T:  12/24/2007  Job:  [id number]  cc:   JMinus Breeding MD, [facility]

## 2010-09-19 NOTE — H&P (Signed)
NAME:  William Montgomery, William Montgomery NO.:  [id number]   MEDICAL RECORD NO.:  [id number]          PATIENT TYPE:  INP   LOCATION:  2013                         FACILITY:  [facility]   PHYSICIAN:  Fay Records, MD, FACCDATE OF BIRTH:  [birth year]   DATE OF ADMISSION:  12/22/2007  DATE OF DISCHARGE:                              HISTORY & PHYSICAL   PRIMARY CARDIOLOGIST:  The patient was previously seen by Dr. Percival Spanish,  but lives in [place], [place], will follow up in [place].   PRIMARY CARE Wylie Russon:  Dr. Rowe Pavy in [place].   PATIENT PROFILE:  A 70 year old married Caucasian male with history of  CAD, status post bare-metal stenting to the LAD in 1999, who presents  with 73-monthhistory of exertional angina.   PROBLEM LIST:  1. Unstable angina/coronary artery disease.      a.     Status post PTCA and bare-metal stenting of the LAD in July       1999.      b.     On February 27, 2000, cardiac catheterization.  Left main       normal, LAD normal, D1 diffuse 80% stenosis, left circumflex       normal.  OM1 and 2, normal.  RCA dominant normal.  Ejection       fraction 60% with LVH.  2. Hyperlipidemia.  3. Degenerative disk disease, status post neck surgery.  4. History of blastomycosis of the right upper lobe in 1977, status      post right upper lobectomy.  5. Remote tobacco abuse about a 18-pack-year history, quitting in      1977.   HISTORY OF PRESENT ILLNESS:  A 70year old married Caucasian male with  history of CAD, status post bare-metal stenting to the LAD in 1999.  His  last catheterization was October 2001 revealing an 80% diffuse diagonal  disease and otherwise nonobstructive disease and has been medically  managed on aspirin therapy at home.  Approximately 4 months ago, he  began walking on a daily basis with his wife and found that he would  develop right axillary and arm pain with hills and faster paces.  The  discomfort would some time moves to his left shoulder and  left arm and  becomes associated shortness of breath.  Symptoms typically lasted 5 or  10 minutes and resolved with decreased pace or as he started going back  down the hill.  Over the past 2 weeks, his symptoms have been occurring  with less activity and had been more severe.  Over the weekend, he had  an episode that occurred very early in his walk requiring that he turned  around and go back home.  Last evening, he was lying in bed and noted a  substernal chest tightness with globus sensation lasting a couple of  minutes and resolving after about 10 minutes.  He did try to take some  old nitroglycerin last night.  He saw his primary care Jahrell Hamor today  and upon discussing his symptoms, [facility]  Currently, he is pain free.   ALLERGIES:  No known drug allergies.   HOME MEDICATIONS:  1. Lipitor 10 mg daily.  2. Aspirin 81 mg daily.  3. Red yeast rice daily.  4. Coenzyme Q10 daily.  5. Multivitamin daily.  6. CLAdaily.  7. Folic acid daily.  8. Vitamin daily.  9. Calcium daily.  10.MSM and chondroitin daily.   FAMILY HISTORY:  Mother died of cervical cancer in her 56s.  Father died  of hepatic cirrhosis at 47.  He has 2 brothers and 2 sisters are alive  and well.  He has multiple half brothers who are alive and well.   SOCIAL HISTORY:  He lives in [place], [place], with his wife.  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  He has about an 18-pack-year history of tobacco abuse,  quitting in 1977.  He drinks about 1 beer every 6 months, and otherwise  does not use alcohol.  He denies any drug use.  He has been walking  daily for the past 4 months as outlined above.   REVIEW OF SYSTEMS:  Positive for exertional chest pain and dyspnea.  He  also had rest angina last night with a globus sensation.  Otherwise, all  systems reviewed and negative.   PHYSICAL  EXAMINATION:  VITAL SIGNS:  Temperature 97.5, heart rate 71,  respirations 20, blood pressure 134/81, and pulse ox 99% on room air.  GENERAL:  A pleasant white male in no acute distress, awake, alert, and  oriented x3.  HEENT:  Normal.  Nares grossly intact, nonfocal.  SKIN:  Warm and dry without lesions or masses.  NECK:  No bruits or JVD.  LUNGS:  Respirations are regular and labored.  Clear to auscultation.  CARDIAC:  Regular S1 and S2.  No S3, S4, or murmurs.  ABDOMEN:  Round, soft, nontender, and nondistended.  Bowel sounds  present x4.  EXTREMITIES:  Warm, dry, and pink.  No clubbing, cyanosis, or edema.  Dorsalis pedis and posterior tibial pulses 2+ and equal bilaterally.   ACCESSORY CLINICAL FINDINGS:  Chest x-ray shows no acute disease.  EKG  shows sinus rhythm at a rate of 70.  There is LVH without any acute ST  or T changes.  Hemoglobin 15.9, hematocrit 47.1, WBC 7.2, and platelets  187.  CK-MB 1.3 and troponin I less than 0.05.   ASSESSMENT AND PLAN:  1. Unstable angina/coronary artery disease.  The patient presents with      7-monthhistory of progressive exertional angina, that has been      worst in the past 2 weeks and had rest symptoms last night.  Plan      to admit for cycle cardiac markers.  Add heparin, nitroglycerin,      beta-blocker.  We will continue his aspirin, increase the dose to      325 a day.  Continue statin therapy.  Plan cardiac catheterization      in the a.m. or sooner if chest pain returns.  If cardiac markers      become positive, we will add 2b/3a inhibitor.  2. Hyperlipidemia.  Check lipids and LFTs.  Continue statin.      CMurray Hodgkins ANP      PFay Records MD, [facility] Electronically Signed    CB/MEDQ  D:  12/22/2007  T:  12/23/2007  Job:  [id number]

## 2010-09-19 NOTE — Assessment & Plan Note (Signed)
[facility]                            CARDIOLOGY OFFICE NOTE   William Montgomery, William Montgomery                      MRN:          [id number]  DATE:01/07/2008                            DOB:          June 26, 1940    PRIMARY CARE PHYSICIAN:  Sherril Cong, MD   REASON FOR PRESENTATION:  Evaluate the patient with recent angioplasty.   HISTORY OF PRESENT ILLNESS:  The patient is a pleasant, 70 year old who  had an LAD stent in 1999.  He was most recently admitted on December 22, 2007, with chest discomfort.  He was ruled out for myocardial  infarction.  However, cardiac catheterization demonstrated the LAD had  95% stenosis in the proximal portion.  There was a proximal stent in the  segment that was patent with 10-20% restenosis.  The mid LAD had 70%  stenosis.  A diagonal had a stable 80-90% lesion.  Circumflex had  luminal irregularities.  The right coronary artery had no disease.  The  EF of 65%.  The patient has subsequently had thrombus drug-eluting  stents in the proximal mid LAD.  He did well with this.   Since going home, he has been back to walking his 4-5 mile route up and  down hills.  He has been getting none of the chest discomfort which was  bothering him.  He has been getting no chest pressure, neck discomfort,  arm discomfort, activity-induced nausea, vomiting, excessive  diaphoresis.  He is having no palpitations, presyncope or syncope.  He  is having no PND or orthopnea.   PAST MEDICAL HISTORY:  Coronary artery disease as described,  dyslipidemia, degenerative disk disease status post neck surgery,  blastomycosis in the right upper lobe in 1977, status post right upper  lobectomy, remote tobacco use quitting in 1977.   ALLERGIES AND INTOLERANCES:  None.   MEDICATIONS:  1. Aspirin 325 mg daily.  2. Plavix 75 mg daily.  3. Lipitor 10 mg nightly.  4. Multivitamin.  5. Coenzyme Q.   REVIEW OF SYSTEMS:  As stated in the HPI and otherwise  negative for all  other systems.   PHYSICAL EXAMINATION:  GENERAL:  The patient is pleasant and in no  distress.  VITAL SIGNS:  Blood pressure 136/82, heart rate 61 and regular, weight  181 pounds, and body mass index 27.  HEENT:  Otherwise unremarkable, pupils equal, round and reactive to  light, fundi not visualized, oral mucosa unremarkable.  NECK:  No jugular venous distention at 45 degrees, carotid upstroke  brisk and symmetrical, no bruits, no thyromegaly.  LYMPHATICS:  No cervical, axillary or inguinal adenopathy.  LUNGS:  Clear to auscultation bilaterally.  BACK:  No costovertebral angle tenderness, well healed right thoracotomy  scar.  CHEST:  Unremarkable.  HEART:  PMI not displaced or sustained, S1 and S2 within normal limits,  no S3, no S4, no clicks, no rubs, no murmurs.  ABDOMEN:  Flat, positive bowel sounds, normal in frequency and pitch, no  bruits, no rebound, no guarding, no midline pulsatile mass, no  hepatomegaly, no splenomegaly.  SKIN:  No rashes, no nodules.  EXTREMITIES:  Pulses 2+ throughout, no edema, no cyanosis, no clubbing.  NEURO:  Oriented to person, place, and time.  Cranial nerves II through  XII grossly intact, motor grossly intact.   EKG, sinus rhythm, rate 61, axis within normal limits, intervals within  normal limits, early transition lead V2, no acute ST-T wave changes.   ASSESSMENT AND PLAN:  1. Coronary artery disease.  The patient has coronary artery disease      as described.  He is not having any further symptoms.  He will      continue the secondary risk reduction.  2. Dyslipidemia.  His HDL remains slightly low.  I am going to switch      him to Radar Base 1000/20 and up titrate as necessary to get his LDL      less than 70 and his HDL greater than 40.  I think this aggressive      approach is warranted given the fact that he does not have lot of      risk factors, but continues to develop obstructive coronary artery      disease.  3.  Hypertension.  Blood pressure is well controlled, and he will      continue the medications as listed.  4. Medications.  The patient understands not to stop Plavix.  We will      continue this at least for a year and indefinitely most of the      contraindications.  5. Followup.  We will see him back in about 3-4 months or sooner if      needed.     Minus Breeding, MD, [facility]  Electronically Signed    JH/MedQ  DD: 01/07/2008  DT: 01/08/2008  Job #: [id number]   cc:   Sherril Cong, MD

## 2010-09-22 NOTE — Procedures (Signed)
[place]  Patient:    William Montgomery, William Montgomery                      MRN: [id number] Proc. Date: 02/27/00 Adm. Date:  [id number] Attending:  Minus Breeding CC:         M. Rowe Pavy, M.D., [facility]  Minus Breeding, M.D. [facility]   Procedure Report  PROCEDURE:  Left heart catheterization with coronary angiography and left ventriculography.  INDICATION:  Recurrent substernal chest pain.  CLINICAL HISTORY:  The patient is a very pleasant 70 year old male with a history of coronary artery disease, status post angioplasty in the past with stent placement.  He developed recurrent substernal chest pain and subsequent shortness of breath and radiation to the neck, which was present for over three weeks but has recently worsened.  He was subsequently referred for left heart catheterization.  DESCRIPTION OF PROCEDURE:  After informed consent was obtained, the patient was taken to the diagnostic catheterization lab in a fasting state.  After the usual preparation and draping, 20 cc of lidocaine was infiltrated into the right femoral region.  The right femoral artery was subsequently punctured and a 7 [place] hemostatic sheath placed in the right femoral artery.  The left Judkins catheter was inserted by way of the right femoral sheath and advanced into the left main coronary artery.  Coronary angiography in the left main coronary system was then carried out.  Following this, the left Judkins catheter was removed and the right Judkins catheter was inserted and advanced into the right coronary artery.  Coronary angiography of the right coronary system was then carried out.  Next, the right Judkins catheter was removed and the pigtail catheter was placed retrograde across the aortic valve, and left ventriculography was performed in the RAO projection.  Following this, the catheter was removed, hemostasis was assured, and the patient returned to his room in good  condition.  COMPLICATIONS:  None.  RESULTS:  Hemodynamics:  The left ventricular pressure was 136/14 and the aortic pressure 127/72.  Left ventriculography:  Left ventriculography was performed in the RAO projection with a total of 30 cc of contrast delivered.  This demonstrated an ejection fraction of greater than or equal to 60%.  There was notable concentric left ventricular hypertrophy.  Coronary angiography:  The left main coronary artery gave rise to an LAD and left circumflex coronary artery.  The left main had no obstructive disease. The LAD had minimal irregularities in the mid- and distal portion.  It gave rise to two diagonal branches, the first of which had diffuse disease in the proximal portion with tandem 80% long lesions.  The rest of the LAD territory was free of significant obstructive coronary disease.  The left circumflex coronary artery gave rise to two obtuse marginal branches, the first of which was a very large branch with two bifurcations.  The distal left circumflex terminated into a terminal marginal branch, which was free of significant obstructive disease, as was the first obtuse marginal branch.  The right coronary artery was a dominant vessel and gave rise to the PDA and two small posterolateral branches.  There was no obstructive coronary disease in the RCA or PDA system.  CONCLUSIONS:  This study demonstrates mild single-vessel coronary disease with preserved left ventricular systolic function and concentric left ventricular hypertrophy and normal left ventricular end-diastolic pressures.  RECOMMENDATION:  Proceed with medical management of the patients coronary disease as tolerated.  If he has  recurrent symptoms, then we would consider angioplasty of the diagonal lesion; however, this is a very long and complex lesion, and the first diagonal branch is a relatively small vessel. DD:  02/27/00 TD:  02/28/00 Job: [id number]

## 2010-09-22 NOTE — Discharge Summary (Signed)
[place]  Patient:    William Montgomery, William Montgomery                      MRN: [id number] Adm. Date:  [id number] Disc. Date: [id number] Attending:  Minus Breeding Dictator:   Davis Gourd, P.A.-C. CC:         Sherril Cong, M.D. - [place], [place]   Discharge Summary  DATE OF BIRTH:  1940-05-24  PROCEDURE: 1. Cardiac catheterization. 2. Coronary arteriogram. 3. Left ventriculogram.  HISTORY OF PRESENT ILLNESS:  William Montgomery is a 70 year old male with no known history of coronary artery disease, who was admitted on February 26, 2000, for chest pain.  He had some electrocardiogram changes and had a history of coronary artery disease with a PTCA and stent to the LAD in July 1999.  He was admitted to rule out a myocardial infarction and for further evaluation.  HOSPITAL COURSE:  His enzymes were negative for a myocardial infarction and he was scheduled for a cardiac catheterization.  He had a catheterization on February 27, 2000, which showed a normal left main, an LAD with no significant disease, and a tandem 80% diffuse disease in the first diagonal.  The RCA and circumflex symptoms did not have any significant disease.  He had no in-stent restenosis.  He had a left ventriculogram done which showed an ejection fraction of approximately 60% with concentric LVH.  It was felt that he needed medical therapy of his coronary artery disease.  By February 28, 2000, he was feeling much better.  He had been placed on Lopressor but had bradycardia with this, and this was discontinued.  He had Imdur added to his medication regimen, and Niaspan at 500 mg q.d. for dyslipidemia, with an HDL of 29 and a triglyceride level of 257.  DISPOSITION:  He was otherwise doing well and considered stable for discharge on February 28, 2000.  LABORATORY DATA:  Total cholesterol 164, triglycerides 257, HDL 29, LDL 84. Serial CPK, MB, and troponin I negative for a myocardial  infarction.  Chest x-ray:  Poor aeration with basilar atelectasis.  CONDITION ON DISCHARGE:  Stable.  CONSULTATION:  None.  COMPLICATIONS:  None.  DISCHARGE DIAGNOSES: 1. Single vessel coronary artery disease with preserved left ventricular    function and concentric left ventricular hypertrophy. 2. Dyslipidemia. 3. History of coronary artery disease with cardiac catheterization in    July 1999, showing an left anterior descending coronary artery with    a 99% proximal lesion involving the first diagonal, which was reduced    to 0% with a percutaneous transluminal coronary angioplasty and stent. 4. 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 5. History of disk and neck surgery.  DISCHARGE INSTRUCTIONS: 1. He is to do no driving, no sexual or strenuous activity for two days. 2. He can return to work on Monday, March 04, 2000. 3. He is to stick to a low-fat diet. 4. He is to call the office for bleeding, swelling, or drainage at the    catheterization site. 5. He is to follow up with Dr. Minus Breeding in [place] on Wednesday,    March 13, 2000, at 12:30 p.m. 6. He is to followup with Dr. Sherril Cong as needed.  DISCHARGE MEDICATIONS: 1. Coated aspirin 325 mg q.d. 2. Imdur 30 mg q.d. 3. Niaspan 500 mg q.d. 4. Nitroglycerin spray p.r.n. DD:  02/28/00 TD:  02/28/00 Job: [id number]

## 2010-10-12 ENCOUNTER — Encounter: Payer: Self-pay | Admitting: Family Medicine

## 2010-10-13 ENCOUNTER — Encounter: Payer: Self-pay | Admitting: Family Medicine

## 2010-10-13 ENCOUNTER — Ambulatory Visit (INDEPENDENT_AMBULATORY_CARE_PROVIDER_SITE_OTHER): Payer: 59 | Admitting: Family Medicine

## 2010-10-13 DIAGNOSIS — N529 Male erectile dysfunction, unspecified: Secondary | ICD-10-CM

## 2010-10-13 DIAGNOSIS — I2510 Atherosclerotic heart disease of native coronary artery without angina pectoris: Secondary | ICD-10-CM

## 2010-10-13 DIAGNOSIS — Z0000 Encounter for general adult medical examination without abnormal findings: Secondary | ICD-10-CM | POA: Insufficient documentation

## 2010-10-13 LAB — COMPREHENSIVE METABOLIC PANEL
ALT: 49 U/L (ref 0–53)
Alkaline Phosphatase: 86 U/L (ref 39–117)
Glucose, Bld: 92 mg/dL (ref 70–99)
Sodium: 140 mEq/L (ref 135–145)
Total Bilirubin: 1 mg/dL (ref 0.3–1.2)
Total Protein: 8 g/dL (ref 6.0–8.3)

## 2010-10-13 LAB — TESTOSTERONE: Testosterone: 372.14 ng/dL (ref 350.00–890.00)

## 2010-10-13 MED ORDER — NITROGLYCERIN 0.4 MG SL SUBL: 0.4000 mg | SUBLINGUAL_TABLET | SUBLINGUAL | Status: DC | PRN

## 2010-10-13 NOTE — Assessment & Plan Note (Signed)
Lab Results  Component Value Date   ALT 56* 07/28/2010   AST 39* 07/28/2010   ALKPHOS 72 07/28/2010   BILITOT 0.9 07/28/2010   Recheck these today (CMET).   If still up, we'll do viral hep panel and order abd u/s to look at liver/biliary system.

## 2010-10-13 NOTE — Assessment & Plan Note (Signed)
Doing well.  Continue lifestyle mod. Vacc's UTD except zostavax---I gave him rx for this in the recent past and he tried to get it at his pharmacy but had insurance/pharmacy problems and decided not to get this.  He declines another rx for this. He has hx of colon polyps, last colonoscopy was 2008 and was normal (Dr. Rowe Pavy).  He requests referral to [facility] for next surveillance colonoscopy and he'll be due for this in 2013. PSA 12/2009 was normal.  We'll repeat this and routine lipids and cbc in 45mo

## 2010-10-13 NOTE — Assessment & Plan Note (Signed)
Problem stable.  Continue current medications and diet appropriate for this condition.  We have reviewed our general long term plan for this problem and also reviewed symptoms and signs that should prompt the patient to call or return to the office. Nitroglycerin sublingual tabs refilled today---his at home were expired.

## 2010-10-13 NOTE — Progress Notes (Signed)
Office Note 10/13/2010  CC:  Chief Complaint  Patient presents with  . Annual Exam    physical and fasting labs    HPI:  William Montgomery is a 70 y.o. White male who is here for annual health maintenance CPE. He is active, walks daily, has no CP or SOB.  He's compliant with meds, tries to think about low Na, low chol/low fat diet. Reviewed last labs from Aug/Sept 2011---lipids fine, TSH, PSA, metabolic panel fine except ALT 60.  AST and ALT were mildly elevated 07/2010. No hx of this per his recollection.     Past Medical History  Diagnosis Date  . Coronary artery disease 12/22/07    chest pain, cardiac cath demontrated 95% stenosis in the proximal portion of teh LAD.  There was a praximal stent with 10-20% restenosis.  The mid LAD had 70% stenosis.  The diagonal had 80-90% stenosis.  The circumfles had luminal irregularities.  Right coronary was free of disease.  He had an EF of 65%.  He subsequently had drug-eluting stent in the proximal mid LAD,   . Hyperlipidemia   . DDD (degenerative disc disease)   . Colon polyps     Dr. Rowe Pavy  . Hearing loss 2004    Dr. Tamala Julian, ENT  . Hypertension   . 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 Back surgery     C-Spine and L-Spine  . Lobectomy     right upper lobectomy  Last low back surgery was in the 1980s in [facility]. Per pt report today.  Family History  Problem Relation Age of Onset  . Cancer Mother     cervical, deceased  . Cirrhosis Father     History   Social History  . Marital Status: Married    Spouse Name: N/A    Number of Children: N/A  . Years of Education: N/A   Occupational History  . Not on file.   Social History Main Topics  . Smoking status: Former Smoker    Quit date: 05/08/1975  . Smokeless tobacco: Former Systems developer    Types: Snuff    Quit date: 05/07/2000   Comment: Scoal  . Alcohol Use: Yes     1 beer every 6 months  . Drug Use: No  . Sexually  Active: Not on file   Other Topics Concern  . Not on file   Social History Narrative  . No narrative on file    Outpatient Prescriptions Prior to Visit  Medication Sig Dispense Refill  . aspirin 325 MG tablet Take 325 mg by mouth daily.        Marland Kitchen atorvastatin (LIPITOR) 10 MG tablet Take 10 mg by mouth daily.        . celecoxib (CELEBREX) 200 MG capsule Take 200 mg by mouth 2 (two) times daily as needed.        . clopidogrel (PLAVIX) 75 MG tablet Take 75 mg by mouth daily.        Marland Kitchen co-enzyme Q-10 30 MG capsule Take 30 mg by mouth daily.        . Multiple Vitamin (MULTIVITAMIN) tablet Take 1 tablet by mouth daily.        . niacin 500 MG tablet Take 500 mg by mouth daily.          No Known Allergies  ROS Review of Systems  Constitutional: Negative for fever, chills, appetite change and  fatigue.  HENT: Negative for ear pain, congestion, sore throat, neck stiffness and dental problem.   Eyes: Negative for discharge, redness and visual disturbance.  Respiratory: Negative for cough, chest tightness, shortness of breath and wheezing.   Cardiovascular: Negative for chest pain, palpitations and leg swelling.  Gastrointestinal: Negative for nausea, vomiting, abdominal pain, diarrhea and blood in stool.  Genitourinary: Negative for dysuria, urgency, frequency, hematuria, flank pain and difficulty urinating.  Musculoskeletal: Positive for back pain (intermittent LBP with some rad into right leg). Negative for myalgias, joint swelling and arthralgias.  Skin: Negative for pallor and rash.  Neurological: Negative for dizziness, speech difficulty, weakness and headaches.  Hematological: Negative for adenopathy. Does not bruise/bleed easily.  Psychiatric/Behavioral: Negative for confusion and sleep disturbance. The patient is not nervous/anxious.     PE; Blood pressure 127/79, pulse 62, temperature 97.4 F (36.3 C), temperature source Oral, height 5' 9"  (1.753 m), weight 176 lb 12.8 oz (80.196  kg), SpO2 96.00%. Gen: Alert, well appearing.  Patient is oriented to person, place, time, and situation. HEENT: Scalp without lesions or hair loss.  Ears: EACs clear, normal epithelium.  TMs with good light reflex and landmarks bilaterally.  Eyes: no injection, icteris, swelling, or exudate.  EOMI, PERRLA. Nose: no drainage or turbinate edema/swelling.  No injection or focal lesion.  Mouth: lips without lesion/swelling.  Oral mucosa pink and moist.  Dentition intact and without obvious caries or gingival swelling.  Oropharynx without erythema, exudate, or swelling.  Neck: supple, ROM full.  Carotids 2+ bilat, without bruit.  No lymphadenopathy, thyromegaly, or mass. Chest: symmetric expansion, nonlabored respirations.  Clear and equal breath sounds in all lung fields.   CV: RRR, no m/r/g.  Peripheral pulses 2+ and symmetric. ABD: soft, NT, ND, BS normal.  No hepatospenomegaly or mass.  No bruits. EXT: no clubbing, cyanosis, or edema.   Pertinent labs:  none  ASSESSMENT AND PLAN:   Health maintenance examination Doing well.  Continue lifestyle mod. Vacc's UTD except zostavax---I gave him rx for this in the recent past and he tried to get it at his pharmacy but had insurance/pharmacy problems and decided not to get this.  He declines another rx for this. He has hx of colon polyps, last colonoscopy was 2008 and was normal (Dr. Rowe Pavy).  He requests referral to [facility] for next surveillance colonoscopy and he'll be due for this in 2013. PSA 12/2009 was normal.  We'll repeat this and routine lipids and cbc in 10mo   Transaminasemia Lab Results  Component Value Date   ALT 56* 07/28/2010   AST 39* 07/28/2010   ALKPHOS 72 07/28/2010   BILITOT 0.9 07/28/2010   Recheck these today (CMET).   If still up, we'll do viral hep panel and order abd u/s to look at liver/biliary system.   CAD Problem stable.  Continue current medications and diet appropriate for this condition.  We have reviewed our  general long term plan for this problem and also reviewed symptoms and signs that should prompt the patient to call or return to the office. Nitroglycerin sublingual tabs refilled today---his at home were expired.     FOLLOW UP:  Return in about 4 months (around 02/12/2011).

## 2010-11-07 ENCOUNTER — Telehealth: Payer: Self-pay | Admitting: Family Medicine

## 2010-11-07 NOTE — Telephone Encounter (Signed)
RC from pt wife, William Montgomery.  Advised her that William Montgomery is not due for repeat colon until mid 2013.  She voices understanding and is agreeable.

## 2010-11-07 NOTE — Telephone Encounter (Signed)
Yes, we'll refer him for this around mid 2013. --PM

## 2010-11-07 NOTE — Telephone Encounter (Signed)
Per chart review, pt is not due for next screening colon until 2013.  I have attempted to contact this patient by phone with the following results: left message for wife, Hassan Rowan to return my call on answering machine (home).

## 2010-11-07 NOTE — Telephone Encounter (Signed)
The patient's wife called she wanted to know why they had not received a call to schedule her husbands colonoscopy. I do not see a referral note in the system . Please advise if the patient should be scheduled for a colonoscopy.

## 2011-01-02 ENCOUNTER — Encounter: Payer: Self-pay | Admitting: Cardiology

## 2011-01-03 ENCOUNTER — Ambulatory Visit (INDEPENDENT_AMBULATORY_CARE_PROVIDER_SITE_OTHER): Payer: 59 | Admitting: Cardiology

## 2011-01-03 ENCOUNTER — Encounter: Payer: Self-pay | Admitting: Cardiology

## 2011-01-03 DIAGNOSIS — E785 Hyperlipidemia, unspecified: Secondary | ICD-10-CM

## 2011-01-03 DIAGNOSIS — I2510 Atherosclerotic heart disease of native coronary artery without angina pectoris: Secondary | ICD-10-CM

## 2011-01-03 DIAGNOSIS — I10 Essential (primary) hypertension: Secondary | ICD-10-CM | POA: Insufficient documentation

## 2011-01-03 MED ORDER — CLOPIDOGREL BISULFATE 75 MG PO TABS: 75.0000 mg | ORAL_TABLET | Freq: Every day | ORAL | Status: DC

## 2011-01-03 NOTE — Assessment & Plan Note (Signed)
The blood pressure is at target. No change in medications is indicated. We will continue with therapeutic lifestyle changes (TLC).  

## 2011-01-03 NOTE — Assessment & Plan Note (Signed)
I reviewed labs from last year and he was at target.  I will be happy to review these when they are drawn by his primary provider at an upcoming visit

## 2011-01-03 NOTE — Progress Notes (Signed)
HPI The patient returns for follow up of CAD.  Since I last saw him he has done well.  He does get some dyspnea when he walks quickly up an incline.  However, he does not get any of the right arm pain that he had previously.  He denies chest pressure, neck or arm pain.  He has no resting SOB, PND or orthopnea.  He has no palpitations, presyncope or syncope. He does exercise routinely.    No Known Allergies  Current Outpatient Prescriptions  Medication Sig Dispense Refill  . aspirin 325 MG tablet Take 325 mg by mouth daily.        Marland Kitchen atorvastatin (LIPITOR) 10 MG tablet Take 10 mg by mouth daily.        Marland Kitchen b complex vitamins tablet Take 1 tablet by mouth daily.        . celecoxib (CELEBREX) 200 MG capsule Take 200 mg by mouth 2 (two) times daily as needed.        . clopidogrel (PLAVIX) 75 MG tablet Take 75 mg by mouth daily.        Marland Kitchen co-enzyme Q-10 30 MG capsule Take 30 mg by mouth daily.        . Multiple Vitamin (MULTIVITAMIN) tablet Take 1 tablet by mouth daily.        . niacin 500 MG tablet Take 500 mg by mouth as directed.       . nitroGLYCERIN (NITROSTAT) 0.4 MG SL tablet Place 1 tablet (0.4 mg total) under the tongue every 5 (five) minutes as needed for chest pain.  30 tablet  2    Past Medical History  Diagnosis Date  . Coronary artery disease 12/22/07    chest pain, cardiac cath demontrated 95% stenosis in the proximal portion of teh LAD.  There was a praximal stent with 10-20% restenosis.  The mid LAD had 70% stenosis.  The diagonal had 80-90% stenosis.  The circumfles had luminal irregularities.  Right coronary was free of disease.  He had an EF of 65%.  He subsequently had drug-eluting stent in the proximal mid LAD,   . Hyperlipidemia   . DDD (degenerative disc disease)   . Colon polyps     Dr. Rowe Pavy  . Hearing loss 2004    Dr. Tamala Julian, ENT  . Hypertension   . 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 Back surgery     C-Spine and  L-Spine  . Lobectomy     right upper lobectomy    ROS:  Decreased hearing.  Otherwise as stated in the HPI and negative for all other systems.  PHYSICAL EXAM BP 120/84  Pulse 56  Resp 16  Ht 5' 9"  (1.753 m)  Wt 177 lb (80.287 kg)  BMI 26.14 kg/m2 GENERAL:  Well appearing HEENT:  Pupils equal round and reactive, fundi not visualized, oral mucosa unremarkable NECK:  No jugular venous distention, waveform within normal limits, carotid upstroke brisk and symmetric, no bruits, no thyromegaly LYMPHATICS:  No cervical, inguinal adenopathy LUNGS:  Clear to auscultation bilaterally BACK:  No CVA tenderness CHEST:  Thoracotomy scar    HEART:  PMI not displaced or sustained,S1 and S2 within normal limits, no S3, no S4, no clicks, no rubs, no murmurs ABD:  Flat, positive bowel sounds normal in frequency in pitch, no bruits, no rebound, no guarding, no midline pulsatile mass, no hepatomegaly, no splenomegaly EXT:  2 plus pulses throughout,  no edema, no cyanosis no clubbing SKIN:  No rashes no nodules NEURO:  Cranial nerves II through XII grossly intact, motor grossly intact throughout PSYCH:  Cognitively intact, oriented to person place and time  EKG:  Sinus rhythm, rate 56, axis within normal limits, intervals within normal limits, no acute ST-T wave changes.  ASSESSMENT AND PLAN

## 2011-01-03 NOTE — Assessment & Plan Note (Signed)
I will bring the patient back for a POET (Plain Old Exercise Test). This will allow me to screen for obstructive coronary disease, risk stratify and very importantly provide a prescription for exercise.

## 2011-01-03 NOTE — Patient Instructions (Addendum)
Your physician has requested that you have an exercise tolerance test. For further information please visit [email or web address]. Please also follow instruction sheet, as given.  Continue current medications

## 2011-01-30 ENCOUNTER — Encounter: Payer: Self-pay | Admitting: Family Medicine

## 2011-01-30 ENCOUNTER — Encounter: Payer: 59 | Admitting: Cardiology

## 2011-01-30 ENCOUNTER — Ambulatory Visit (INDEPENDENT_AMBULATORY_CARE_PROVIDER_SITE_OTHER): Payer: 59 | Admitting: Cardiology

## 2011-01-30 DIAGNOSIS — I2510 Atherosclerotic heart disease of native coronary artery without angina pectoris: Secondary | ICD-10-CM

## 2011-01-30 NOTE — Progress Notes (Signed)
Exercise Treadmill Test  Pre-Exercise Testing Evaluation Rhythm: sinus bradycardia  Rate: 54   PR:  .16 QRS:  .10  QT:  40 QTc: 38     Test  Exercise Tolerance Test Ordering MD: Marijo File, MD  Interpreting MD:  Marijo File, MD  Unique Test No: 1  Treadmill:  1  Indication for ETT: known ASHD  Contraindication to ETT: No   Stress Modality: exercise - treadmill  Cardiac Imaging Performed: non   Protocol: standard Bruce - maximal  Max BP:  194/80  Max MPHR (bpm):151 85% MPR (bpm):  128  MPHR obtained (bpm):  136 % MPHR obtained:  90  Reached 85% MPHR (min:sec):  5:37 Total Exercise Time (min-sec):  6:15  Workload in METS:  7.3 Borg Scale: 15  Reason ETT Terminated:  desired heart rate attained    ST Segment Analysis At Rest: normal ST segments - no evidence of significant ST depression With Exercise: no evidence of significant ST depression  Other Information Arrhythmia:  No Angina during ETT:  absent (0) Quality of ETT:  diagnostic  ETT Interpretation:  normal - no evidence of ischemia by ST analysis  Comments: The patient had a moderate exercise tolerance.  There was no chest pain.  There was an appropriate level of dyspnea.  There were no arrhythmias, a normal heart rate response and normal BP response.  There were no ischemic ST T wave changes and a normal heart rate recovery.  Recommendations: Negative adequate ETT.  No further testing is indicated.  Based on the above I gave the patient a prescription for exercise.

## 2011-02-12 ENCOUNTER — Ambulatory Visit: Payer: 59 | Admitting: Family Medicine

## 2011-02-13 ENCOUNTER — Ambulatory Visit (INDEPENDENT_AMBULATORY_CARE_PROVIDER_SITE_OTHER): Payer: 59 | Admitting: Family Medicine

## 2011-02-13 ENCOUNTER — Telehealth: Payer: Self-pay | Admitting: Family Medicine

## 2011-02-13 ENCOUNTER — Encounter: Payer: Self-pay | Admitting: Family Medicine

## 2011-02-13 DIAGNOSIS — M545 Low back pain, unspecified: Secondary | ICD-10-CM

## 2011-02-13 DIAGNOSIS — D649 Anemia, unspecified: Secondary | ICD-10-CM

## 2011-02-13 DIAGNOSIS — E785 Hyperlipidemia, unspecified: Secondary | ICD-10-CM

## 2011-02-13 DIAGNOSIS — IMO0002 Reserved for concepts with insufficient information to code with codable children: Secondary | ICD-10-CM

## 2011-02-13 DIAGNOSIS — I2510 Atherosclerotic heart disease of native coronary artery without angina pectoris: Secondary | ICD-10-CM

## 2011-02-13 DIAGNOSIS — Z125 Encounter for screening for malignant neoplasm of prostate: Secondary | ICD-10-CM

## 2011-02-13 LAB — LIPID PANEL
HDL: 41.1 mg/dL (ref >39.00)
Triglycerides: 267 mg/dL — ABNORMAL HIGH (ref 0.0–149.0)
VLDL: 53.4 mg/dL — ABNORMAL HIGH (ref 0.0–40.0)

## 2011-02-13 LAB — CBC WITH DIFFERENTIAL/PLATELET
Basophils Absolute: 0 10*3/uL (ref 0.0–0.1)
Basophils Relative: 0.4 % (ref 0.0–3.0)
Eosinophils Absolute: 0.4 10*3/uL (ref 0.0–0.7)
Lymphocytes Relative: 29.9 % (ref 12.0–46.0)
MCHC: 34 g/dL (ref 30.0–36.0)
Neutrophils Relative %: 55.1 % (ref 43.0–77.0)
Platelets: 204 10*3/uL (ref 150.0–400.0)
RBC: 5.28 Mil/uL (ref 4.22–5.81)

## 2011-02-13 LAB — PSA: PSA: 2.34 ng/mL (ref 0.10–4.00)

## 2011-02-13 MED ORDER — AMITRIPTYLINE HCL 25 MG PO TABS: ORAL_TABLET | ORAL | Status: DC

## 2011-02-13 NOTE — Progress Notes (Signed)
OFFICE NOTE  02/13/2011  CC:  Chief Complaint  Patient presents with  . Coronary Artery Disease    4 month follow up     HPI:   Patient is a 70 y.o. Caucasian male who is here for f/u CAD, hyperlipidemia, and HTN.   He reports that all is well regarding his chronic medical problems; recent ETT at cardiologist's office was negative.  He is compliant with meds and walks a few miles most days of the week.   Wants to discuss back pain today:  He has a long history of back pain, with lumbar disc surgery many years ago.  Surgery brought a year or two of relief but then he started a pattern of recurrent episodes of low back pain focused in the area of the crest of his right hip, with intermittent right leg radiculopathy sx's down into foot.  Worse with prolonged supine position or lying on right side, better when he gets up and walks and is more active.  Has done some low back stretches on and off, has been on NSAIDs infrequently (pt reports fear of these meds).  Says he has dealt with the pain for years and is ready to do something to take care of it now.  Says he had an abnormal MRI about 4 yrs ago at [facility] (Dr. Rowe Pavy?) and was told that the only thing that could be done was surgery.  He has not seen a neurosurgeon since [place], [place]  Pertinent PMH:  Past Medical History  Diagnosis Date  . Coronary artery disease 12/22/07    chest pain, cardiac cath demontrated 95% stenosis in the proximal portion of teh LAD.  There was a praximal stent with 10-20% restenosis.  The mid LAD had 70% stenosis.  The diagonal had 80-90% stenosis.  The circumfles had luminal irregularities.  Right coronary was free of disease.  He had an EF of 65%.  He subsequently had drug-eluting stent in the proximal mid LAD.   ETT 01/30/11 was NEGATIVE.  Marland Kitchen Hyperlipidemia   . DDD (degenerative disc disease)   . Colon polyps     Dr. Rowe Pavy  . Hearing loss 2004    Dr. Tamala Julian, ENT  .  Hypertension   . 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 Back surgery     C-Spine and L-Spine  . Lobectomy     right upper lobectomy    MEDS;   Outpatient Prescriptions Prior to Visit  Medication Sig Dispense Refill  . aspirin 325 MG tablet Take 325 mg by mouth daily.        Marland Kitchen atorvastatin (LIPITOR) 10 MG tablet Take 10 mg by mouth daily.        Marland Kitchen b complex vitamins tablet Take 1 tablet by mouth daily.        . celecoxib (CELEBREX) 200 MG capsule Take 200 mg by mouth 2 (two) times daily as needed.        . clopidogrel (PLAVIX) 75 MG tablet Take 1 tablet (75 mg total) by mouth daily.  90 tablet  3  . co-enzyme Q-10 30 MG capsule Take 30 mg by mouth daily.        . Multiple Vitamin (MULTIVITAMIN) tablet Take 1 tablet by mouth daily.        . niacin 500 MG tablet Take 500 mg by mouth as directed.       Marland Kitchen  nitroGLYCERIN (NITROSTAT) 0.4 MG SL tablet Place 1 tablet (0.4 mg total) under the tongue every 5 (five) minutes as needed for chest pain.  30 tablet  2    PE: Blood pressure 116/70, pulse 60, temperature 97.9 F (36.6 C), temperature source Oral, height 5' 9"  (1.753 m), weight 178 lb (80.74 kg), SpO2 97.00%. Gen: Alert, well appearing.  Patient is oriented to person, place, time, and situation. Chest: symmetric expansion, nonlabored respirations.  Clear and equal breath sounds in all lung fields.   CV: RRR, no m/r/g.  Peripheral pulses 2+ and symmetric. BACK: ROM intact but mildly painful in flexion.  Mild TTP in right lumbosacral region.  No lateral hip tenderness.  NO sciatic notch TTP. Hip ROM intact w/out pain.  Sitting straight leg raise reproduces his pain in right l/s region but no radiation is noted.  Straight leg raise in supine position elicits no complaint on either side.  LE strength 5/5 prox and dist bilat, with 2+ DTRS bilat in patellar and achilles areas.   IMPRESSION AND PLAN:  Low back pain Suspect DDD with  intermittent spinal nerve impingement. Recommended conservative management to patient (PT, NSAIDs, pain meds prn, no imaging at this time) but he was not happy with this advise. I agreed to refer him to neurosurgery for further evaluation and management (pt requested Dr. Kristeen Miss).   Will try to obtain the MRI he got at [place] about 4 yrs ago. I did recommend a trial of amitriptyline qhs for tx of radicular/neuropathic pain: start 48m qhs x 10d, then increase to 561mqhs.  Therapeutic expectations and side effect profile of medication discussed today.  Patient's questions answered.  CAD Problem stable.  Continue current medications and diet appropriate for this condition.  We have reviewed our general long term plan for this problem and also reviewed symptoms and signs that should prompt the patient to call or return to the office.   DYSLIPIDEMIA Due for FLP today. Transaminases 4 mo ago were normal.  Prostate cancer screening Due for annual PSA today.   HTN: stable on no meds.  FOLLOW UP:  Return in about 4 months (around 06/16/2011) for f/u HTN, hyperlip, CAD, low back pain.

## 2011-02-13 NOTE — Telephone Encounter (Signed)
Please ask [facility] hospital for records of MRI of his lumbar spine (all of them if there is more than one).  Thx--PM

## 2011-02-13 NOTE — Assessment & Plan Note (Signed)
Suspect DDD with intermittent spinal nerve impingement. Recommended conservative management to patient (PT, NSAIDs, pain meds prn, no imaging at this time) but he was not happy with this advise. I agreed to refer him to neurosurgery for further evaluation and management (pt requested Dr. Kristeen Miss).   Will try to obtain the MRI he got at [facility] about 4 yrs ago. I did recommend a trial of amitriptyline qhs for tx of radicular/neuropathic pain: start 81m qhs x 10d, then increase to 542mqhs.  Therapeutic expectations and side effect profile of medication discussed today.  Patient's questions answered.

## 2011-02-13 NOTE — Assessment & Plan Note (Signed)
Due for annual PSA today.

## 2011-02-13 NOTE — Assessment & Plan Note (Signed)
Problem stable.  Continue current medications and diet appropriate for this condition.  We have reviewed our general long term plan for this problem and also reviewed symptoms and signs that should prompt the patient to call or return to the office.  

## 2011-02-13 NOTE — Assessment & Plan Note (Signed)
Due for FLP today. Transaminases 4 mo ago were normal.

## 2011-02-14 LAB — ALT: ALT: 49 U/L (ref 0–53)

## 2011-02-15 ENCOUNTER — Encounter: Payer: Self-pay | Admitting: Family Medicine

## 2011-02-15 NOTE — Telephone Encounter (Signed)
Rec'd records today 02/14/11

## 2011-03-06 ENCOUNTER — Other Ambulatory Visit: Payer: Self-pay | Admitting: *Deleted

## 2011-03-06 MED ORDER — ATORVASTATIN CALCIUM 20 MG PO TABS: 20.0000 mg | ORAL_TABLET | Freq: Every day | ORAL | Status: DC

## 2011-03-06 NOTE — Telephone Encounter (Signed)
Last seen on 02/13/11, follow up on 06/15/11.  90 day supply sent until that time.  MyChart message to pt to notify RX sent.

## 2011-04-15 ENCOUNTER — Encounter: Payer: Self-pay | Admitting: Emergency Medicine

## 2011-04-15 ENCOUNTER — Emergency Department (INDEPENDENT_AMBULATORY_CARE_PROVIDER_SITE_OTHER)
Admission: EM | Admit: 2011-04-15 | Discharge: 2011-04-15 | Disposition: A | Discharge status: Home / Self Care | Payer: 59 | Source: Home / Self Care | Attending: Family Medicine | Admitting: Family Medicine

## 2011-04-15 DIAGNOSIS — H1030 Unspecified acute conjunctivitis, unspecified eye: Secondary | ICD-10-CM

## 2011-04-15 DIAGNOSIS — H1032 Unspecified acute conjunctivitis, left eye: Secondary | ICD-10-CM

## 2011-04-15 MED ORDER — SULFACETAMIDE SODIUM 10 % OP SOLN: OPHTHALMIC | Status: DC

## 2011-04-15 NOTE — ED Notes (Signed)
Left eye conjuctivitis x 2 days; no known injury, altho was putting out pine straw.

## 2011-04-17 NOTE — ED Provider Notes (Signed)
History     CSN: [id number] Arrival date & time: 04/15/2011  2:39 PM   First MD Initiated Contact with Patient 04/15/11 1519      Chief Complaint  Patient presents with  . Conjunctivitis     HPI Comments: Patient has been spreading pine straw over the past several days.  Two days ago he developed a foreign body sensation in his left eye that has persisted.  His left eye has been slightly red without drainage.  No change in vision.  The history is provided by the patient.    Past Medical History  Diagnosis Date  . Coronary artery disease 12/22/07    chest pain, cardiac cath demontrated 95% stenosis in the proximal portion of teh LAD.  There was a praximal stent with 10-20% restenosis.  The mid LAD had 70% stenosis.  The diagonal had 80-90% stenosis.  The circumfles had luminal irregularities.  Right coronary was free of disease.  He had an EF of 65%.  He subsequently had drug-eluting stent in the proximal mid LAD.   ETT 01/30/11 was NEGATIVE.  Marland Kitchen Hyperlipidemia   . 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  . Colon polyps     Dr. Rowe Pavy  . Hearing loss 2004    Dr. Tamala Julian, ENT  . Hypertension   . 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 Back surgery     C-Spine and L-Spine  . Lobectomy     right upper lobectomy    Family History  Problem Relation Age of Onset  . Cancer Mother     cervical, deceased  . Cirrhosis Father     History  Substance Use Topics  . Smoking status: Former Smoker    Quit date: 05/08/1975  . Smokeless tobacco: Former Systems developer    Types: Snuff    Quit date: 05/07/2000   Comment: Scoal  . Alcohol Use: Yes     1 beer every 6 months      Review of Systems  All other systems reviewed and are negative.    Allergies  Review of patient's allergies indicates no known allergies.  Home  Medications   Current Outpatient Rx  Name Route Sig Dispense Refill  . AMITRIPTYLINE HCL 25 MG PO TABS  1 tab po qhs x 10d, then increase to 2 tabs po qhs 60 tablet 3  . ASPIRIN 325 MG PO TABS Oral Take 325 mg by mouth daily.      . ATORVASTATIN CALCIUM 20 MG PO TABS Oral Take 1 tablet (20 mg total) by mouth daily. 90 tablet 1  . B COMPLEX PO TABS Oral Take 1 tablet by mouth daily.      . CELECOXIB 200 MG PO CAPS Oral Take 200 mg by mouth 2 (two) times daily as needed.      . CLOPIDOGREL BISULFATE 75 MG PO TABS Oral Take 1 tablet (75 mg total) by mouth daily. 90 tablet 3  . COENZYME Q10 30 MG PO CAPS Oral Take 30 mg by mouth daily.      Marland Kitchen ONE-DAILY MULTI VITAMINS PO TABS Oral Take 1 tablet by mouth daily.      Marland Kitchen NIACIN 500 MG PO TABS Oral Take 500 mg by mouth as directed.     Marland Kitchen NITROGLYCERIN 0.4 MG SL SUBL Sublingual Place 1 tablet (  0.4 mg total) under the tongue every 5 (five) minutes as needed for chest pain. 30 tablet 2  . SULFACETAMIDE SODIUM 10 % OP SOLN  Place 1 to 2 drops in the left eye every 2 to 3 hours 5 mL 0    Pulse 71  Temp(Src) 98.3 F (36.8 C) (Oral)  Resp 16  Ht 5' 9"  (1.753 m)  Wt 180 lb (81.647 kg)  BMI 26.58 kg/m2  SpO2 97%  Physical Exam  Constitutional: He appears well-developed and well-nourished. No distress.  HENT:  Head: Normocephalic.  Right Ear: External ear normal.  Left Ear: External ear normal.  Nose: Nose normal.  Mouth/Throat: Oropharynx is clear and moist.  Eyes: EOM and lids are normal. Pupils are equal, round, and reactive to light. No foreign bodies found. Right eye exhibits no discharge and no exudate. Left eye exhibits no discharge, no exudate and no hordeolum. No foreign body present in the left eye. Right conjunctiva is not injected. Left conjunctiva is injected. Left conjunctiva has no hemorrhage.       Fundi are normal.  Fluorescein to the left eye reveals no uptake.    ED Course  Procedures  none      1. Conjunctivitis, acute,  left eye       MDM  Begin sulfacetamide ophthalmic suspension in left eye q2 to 3 hours.  Followup with ophthalmologist if not improving 2 to 3 days.         Theone Murdoch, MD 04/17/11 [phone]

## 2011-05-08 DIAGNOSIS — K824 Cholesterolosis of gallbladder: Secondary | ICD-10-CM

## 2011-05-08 HISTORY — DX: Cholesterolosis of gallbladder: K82.4

## 2011-06-07 ENCOUNTER — Other Ambulatory Visit: Payer: Self-pay | Admitting: Family Medicine

## 2011-06-08 DIAGNOSIS — K7581 Nonalcoholic steatohepatitis (NASH): Secondary | ICD-10-CM

## 2011-06-08 HISTORY — DX: Nonalcoholic steatohepatitis (NASH): K75.81

## 2011-06-08 NOTE — Telephone Encounter (Signed)
Last seen 02/13/11, has follow up on 06/15/11.  Please advise refills.  Thanks.

## 2011-06-15 ENCOUNTER — Encounter: Payer: Self-pay | Admitting: Family Medicine

## 2011-06-15 ENCOUNTER — Ambulatory Visit (INDEPENDENT_AMBULATORY_CARE_PROVIDER_SITE_OTHER): Payer: 59 | Admitting: Family Medicine

## 2011-06-15 DIAGNOSIS — E785 Hyperlipidemia, unspecified: Secondary | ICD-10-CM

## 2011-06-15 DIAGNOSIS — M545 Low back pain: Secondary | ICD-10-CM

## 2011-06-15 LAB — LIPID PANEL
Total CHOL/HDL Ratio: 3
Triglycerides: 169 mg/dL — ABNORMAL HIGH (ref 0.0–149.0)

## 2011-06-15 LAB — AST: AST: 43 U/L — ABNORMAL HIGH (ref 0–37)

## 2011-06-15 LAB — ALT: ALT: 66 U/L — ABNORMAL HIGH (ref 0–53)

## 2011-06-15 MED ORDER — AMITRIPTYLINE HCL 25 MG PO TABS: ORAL_TABLET | ORAL | Status: DC

## 2011-06-15 NOTE — Assessment & Plan Note (Signed)
Doing well on amitriptyline 1m bid but dry mouth and increased appetite are side effects he's tolerating. Says he'll tolerate it a while longer and see if it subsides.  May do trial of neurontin in place of amitriptyline in future if pt can't deal with amitr s/e's any longer.  I'll check into what happened with our neurosurgery referral.

## 2011-06-15 NOTE — Progress Notes (Signed)
OFFICE VISIT  06/15/2011   CC:  Chief Complaint  Patient presents with  . Follow-up    HTN, Hyperlip, CAD, low back pain...needs refill Celebrex     HPI:    Patient is a 71 y.o. Caucasian male who presents for 4 mo f/u low back pain, CAD, hyperlipidemia. No CP, palp's, SOB, or lightheadedness.  Compliant with meds.  We increased his statin 7moago and he denies side effects. Has been doing back stretches at home and taking amitriptyline 277mbid and says his low back pain is improved and he no longer has right leg radiculopathy pain.  However, dry mouth and increased appetite from amitriptyline are barely tolerable per pt.  Somehow the neurosurg referral I ordered last visit never got done-pt never got a phone call.   Past Medical History  Diagnosis Date  . Coronary artery disease 12/22/07    chest pain, cardiac cath demontrated 95% stenosis in the proximal portion of teh LAD.  There was a praximal stent with 10-20% restenosis.  The mid LAD had 70% stenosis.  The diagonal had 80-90% stenosis.  The circumfles had luminal irregularities.  Right coronary was free of disease.  He had an EF of 65%.  He subsequently had drug-eluting stent in the proximal mid LAD.   ETT 01/30/11 was NEGATIVE.  . Marland Kitchenyperlipidemia   . 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  . Colon polyps     Dr. AnRowe Pavy. Hearing loss 2004    Dr. SmTamala JulianENT  . Hypertension   . 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 Back surgery     C-Spine and L-Spine  . Lobectomy     right upper lobectomy    Outpatient Prescriptions Prior to Visit  Medication Sig Dispense Refill  . aspirin 325 MG tablet Take 325 mg by mouth daily.        . Marland Kitchentorvastatin (LIPITOR) 20 MG tablet Take 1 tablet (20 mg total) by mouth daily.  90 tablet  1  . b complex vitamins tablet Take 1 tablet  by mouth daily.        . celecoxib (CELEBREX) 200 MG capsule Take 200 mg by mouth 2 (two) times daily as needed.        . clopidogrel (PLAVIX) 75 MG tablet Take 1 tablet (75 mg total) by mouth daily.  90 tablet  3  . co-enzyme Q-10 30 MG capsule Take 30 mg by mouth daily.        . Multiple Vitamin (MULTIVITAMIN) tablet Take 1 tablet by mouth daily.        . niacin 500 MG tablet Take 500 mg by mouth as directed.       . nitroGLYCERIN (NITROSTAT) 0.4 MG SL tablet Place 1 tablet (0.4 mg total) under the tongue every 5 (five) minutes as needed for chest pain.  30 tablet  2  . amitriptyline (ELAVIL) 25 MG tablet TAKE 1 TABLET BY MOUTH AT BEDTIME FOR 10 DAYS THEN INCREASE TO 2 TABLETS AT BEDTIME THEREAFTER  60 tablet  3  . sulfacetamide (BLEPH-10) 10 % ophthalmic solution Place 1 to 2 drops in the left eye every 2 to 3 hours  5 mL  0    No Known Allergies  ROS As per HPI  PE: Blood pressure 124/85, pulse 76,  temperature 97.2 F (36.2 C), temperature source Temporal, height 5' 9"  (1.753 m), weight 184 lb (83.462 kg). Gen: Alert, well appearing.  Patient is oriented to person, place, time, and situation. CV: RRR, no m/r/g.   LUNGS: CTA bilat, nonlabored resps, good aeration in all lung fields. BACK : ROM intact with minimal discomfort.  Occ slight pain in right lumbosacral paraspinous soft tissues. No significant TTP in back.  LE strength 5/5 prox and dist.  DTRs: 1+ patellar bilat, trace achilles bilat. Sitting SLR neg bilat.  LABS:  None today. Lab Results  Component Value Date   CHOL 174 02/13/2011   HDL 41.10 02/13/2011   LDLDIRECT 101.4 02/13/2011   TRIG 267.0* 02/13/2011   CHOLHDL 4 02/13/2011   Lab Results  Component Value Date   ALT 49 02/13/2011   AST 41* 02/13/2011   ALKPHOS 86 10/13/2010   BILITOT 1.0 10/13/2010    IMPRESSION AND PLAN:  Low back pain Doing well on amitriptyline 3m bid but dry mouth and increased appetite are side effects he's tolerating. Says he'll tolerate  it a while longer and see if it subsides.  May do trial of neurontin in place of amitriptyline in future if pt can't deal with amitr s/e's any longer.  I'll check into what happened with our neurosurgery referral.  DYSLIPIDEMIA Will recheck FLP today, see if any improvement noted after our increase to 250mqd 52m652moo.  Transaminasemia Very mild last check. Recheck AST/ALT today.  If still up will go ahead and make sure Hep B and C testing is negative.     FOLLOW UP: Return in about 4 months (around 10/13/2011) for f/u LBP, hyperlipidemia.

## 2011-06-15 NOTE — Assessment & Plan Note (Signed)
Will recheck FLP today, see if any improvement noted after our increase to 49m qd 458mogo.

## 2011-06-15 NOTE — Assessment & Plan Note (Addendum)
Very mild last check. Recheck AST/ALT today.  If still up will go ahead and make sure Hep B and C testing is negative.

## 2011-06-18 NOTE — Progress Notes (Signed)
Addended by: Darlina Sicilian R on: 06/18/2011 04:11 PM   Modules accepted: Orders

## 2011-06-19 ENCOUNTER — Other Ambulatory Visit: Payer: Self-pay | Admitting: Family Medicine

## 2011-06-19 ENCOUNTER — Encounter: Payer: Self-pay | Admitting: Family Medicine

## 2011-06-19 LAB — HEPATITIS C ANTIBODY: HCV Ab: NEGATIVE

## 2011-06-19 MED ORDER — CELECOXIB 200 MG PO CAPS: 200.0000 mg | ORAL_CAPSULE | Freq: Two times a day (BID) | ORAL | Status: DC | PRN

## 2011-06-20 ENCOUNTER — Encounter: Payer: Self-pay | Admitting: Family Medicine

## 2011-06-20 ENCOUNTER — Other Ambulatory Visit: Payer: Self-pay | Admitting: Family Medicine

## 2011-06-20 ENCOUNTER — Telehealth: Payer: Self-pay | Admitting: *Deleted

## 2011-06-20 DIAGNOSIS — Z86010 Personal history of colonic polyps: Secondary | ICD-10-CM

## 2011-06-20 NOTE — Telephone Encounter (Signed)
Referral entered.  thx--PM

## 2011-06-20 NOTE — Telephone Encounter (Signed)
Patient called back and said [facility] is where he wants to go.

## 2011-06-20 NOTE — Telephone Encounter (Signed)
MyChart message received from pt: Do we need to schedule a colonoscopy as my records indicate I am past due?  Last colon 02/26/02.  HX of polyps, but last colon clear.  Please advise colon now or wait until October.

## 2011-06-20 NOTE — Telephone Encounter (Signed)
He should go ahead and get one scheduled. Dr. Rowe Pavy did his last one.  Please clarify that he wants referral to [place] for this.  thx

## 2011-06-20 NOTE — Telephone Encounter (Signed)
I have attempted to contact this patient by phone with the following results: left message to return my call on answering machine (home).

## 2011-06-26 ENCOUNTER — Other Ambulatory Visit (HOSPITAL_BASED_OUTPATIENT_CLINIC_OR_DEPARTMENT_OTHER): Payer: 59

## 2011-06-26 ENCOUNTER — Ambulatory Visit (HOSPITAL_BASED_OUTPATIENT_CLINIC_OR_DEPARTMENT_OTHER)
Admission: RE | Admit: 2011-06-26 | Discharge: 2011-06-26 | Disposition: A | Discharge status: Home / Self Care | Payer: 59 | Source: Ambulatory Visit | Attending: Family Medicine | Admitting: Family Medicine

## 2011-06-26 DIAGNOSIS — E785 Hyperlipidemia, unspecified: Secondary | ICD-10-CM | POA: Diagnosis not present

## 2011-06-26 DIAGNOSIS — N281 Cyst of kidney, acquired: Secondary | ICD-10-CM

## 2011-06-26 DIAGNOSIS — I700 Atherosclerosis of aorta: Secondary | ICD-10-CM

## 2011-06-26 DIAGNOSIS — N289 Disorder of kidney and ureter, unspecified: Secondary | ICD-10-CM | POA: Insufficient documentation

## 2011-06-26 DIAGNOSIS — R935 Abnormal findings on diagnostic imaging of other abdominal regions, including retroperitoneum: Secondary | ICD-10-CM

## 2011-06-28 ENCOUNTER — Encounter: Payer: Self-pay | Admitting: Gastroenterology

## 2011-07-03 ENCOUNTER — Encounter: Payer: Self-pay | Admitting: Gastroenterology

## 2011-07-06 DIAGNOSIS — K635 Polyp of colon: Secondary | ICD-10-CM

## 2011-07-06 HISTORY — DX: Polyp of colon: K63.5

## 2011-07-09 ENCOUNTER — Encounter: Payer: Self-pay | Admitting: *Deleted

## 2011-07-12 ENCOUNTER — Encounter: Payer: Self-pay | Admitting: Gastroenterology

## 2011-07-12 ENCOUNTER — Telehealth: Payer: Self-pay

## 2011-07-12 ENCOUNTER — Ambulatory Visit (INDEPENDENT_AMBULATORY_CARE_PROVIDER_SITE_OTHER): Payer: 59 | Admitting: Gastroenterology

## 2011-07-12 VITALS — BP 132/64 | HR 60 | Ht 69.0 in | Wt 182.0 lb

## 2011-07-12 DIAGNOSIS — Z7901 Long term (current) use of anticoagulants: Secondary | ICD-10-CM

## 2011-07-12 DIAGNOSIS — I2510 Atherosclerotic heart disease of native coronary artery without angina pectoris: Secondary | ICD-10-CM

## 2011-07-12 DIAGNOSIS — Z1211 Encounter for screening for malignant neoplasm of colon: Secondary | ICD-10-CM | POA: Insufficient documentation

## 2011-07-12 DIAGNOSIS — Z860100 Personal history of colon polyps, unspecified: Secondary | ICD-10-CM | POA: Insufficient documentation

## 2011-07-12 MED ORDER — PEG-KCL-NACL-NASULF-NA ASC-C 100 G PO SOLR: 1.0000 | Freq: Once | ORAL | Status: DC

## 2011-07-12 NOTE — Patient Instructions (Signed)
You have been scheduled for a colonoscopy with propofol. Please follow written instructions given to you at your visit today.  Please pick up your prep kit at the pharmacy within the next 1-3 days.  We will call you to verify the instructions on holding your plavix.  Please continue to take your aspirin

## 2011-07-12 NOTE — Telephone Encounter (Signed)
07/12/2011    RE: LONNEL GJERDE DOB: [birth year] MRN: [id number]   Dear Dr. Percival Spanish:    We have scheduled the above patient for an endoscopic procedure. Our records show that he is on anticoagulation therapy.   Please advise as to how long the patient may come off his therapy of Plavix prior to the procedure, which is scheduled for 07-25-11.  Please fax back/ or route the completed form to [facility] at [phone].   Sincerely,  Phillis Haggis

## 2011-07-12 NOTE — Progress Notes (Signed)
History of Present Illness:  This is a very pleasant 71 year old Caucasian male with coronary artery disease and previous cardiac stenting in 2009 and 1999. He is currently on aspirin 325 mg a day and Plavix 75 mg a day. He is followed by Dr. Shawnie Dapper and Dr. Marijo File. The patient apparently had colonoscopy with polypectomy in 1999, a negative exam in 2003 by Dr. Rowe Pavy in [facility],, and has not had followup since that time. He currently has regular bowel movements but does occasionally have spotty blood if constipated. He denies abdominal pain, upper GI or hepatobiliary complaints. His appetite is good his weight is stable. Family history is negative for any history of colon cancer or polyps. He denies any specific food intolerances.  I have reviewed this patient's present history, medical and surgical past history, allergies and medications.     ROS: The remainder of the 10 point ROS is negative... denies current cardiovascular or pulmonary complaints. He is status post right upper lobe resection for blastomycosis. He has had previous appendectomy . He has does not use nitroglycerin, and had a recent negative exercise treadmill evaluation per cardiology. Does describe easy bruisability.     Physical Exam: Pressure 132/64 and pulse 60 and regular. BMI normal at 26.8. General well developed well nourished patient in no acute distress, appearing his stated age Eyes PERRLA, no icterus, fundoscopic exam per opthamologist Skin no lesions noted Neck supple, no adenopathy, no thyroid enlargement, no tenderness Chest clear to percussion and auscultation Heart no significant murmurs, gallops or rubs noted Abdomen no hepatosplenomegaly masses or tenderness, BS normal.  Extremities no acute joint lesions, edema, phlebitis or evidence of cellulitis. Neurologic patient oriented x 3, cranial nerves intact, no focal neurologic deficits noted. Psychological mental status normal and normal  affect.  Assessment and plan: Very healthy patient who has had previous cardiac stenting and is on aspirin and Plavix. He has a history of previous colonic polyposis, and I have scheduled him for 10 year followup. We will hold his Plavix 5 days before this procedure as otherwise advised by his doctors. I have reviewed colonoscopy with his risk and benefits, also preparation with a balanced electrolyte solution, and we will proceed with propofol sedation a nurse anesthesia assistance. Throughout compensated from a cardiovascular and pulmonary standpoint. Review of recent lab shows no specific abnormalities.  No diagnosis found.

## 2011-07-17 NOTE — Telephone Encounter (Signed)
The patient has had no recent cardiac procedures and no recent symptoms. He can hold his Plavix as needed 5 - 7 days prior to the procedure.

## 2011-07-18 ENCOUNTER — Encounter: Payer: 59 | Admitting: Gastroenterology

## 2011-07-18 ENCOUNTER — Telehealth: Payer: Self-pay | Admitting: Gastroenterology

## 2011-07-19 NOTE — Telephone Encounter (Signed)
William Montgomery advised pt to hold plavix, see next phone note

## 2011-07-19 NOTE — Telephone Encounter (Signed)
Minus Breeding, MD 07/17/2011 4:18 PM Signed  The patient has had no recent cardiac procedures and no recent symptoms. He can hold his Plavix as needed 5 - 7 days prior to the procedure.   Informed pt since his procedure is 07/25/11, he should stop his Plavix on 07/19/11; pt stated understanding.

## 2011-07-25 ENCOUNTER — Encounter: Payer: Self-pay | Admitting: Gastroenterology

## 2011-07-25 ENCOUNTER — Ambulatory Visit (AMBULATORY_SURGERY_CENTER): Payer: 59 | Admitting: Gastroenterology

## 2011-07-25 VITALS — BP 158/80 | HR 73 | Temp 97.4°F | Resp 15 | Ht 69.0 in | Wt 182.0 lb

## 2011-07-25 DIAGNOSIS — K5730 Diverticulosis of large intestine without perforation or abscess without bleeding: Secondary | ICD-10-CM

## 2011-07-25 DIAGNOSIS — D126 Benign neoplasm of colon, unspecified: Secondary | ICD-10-CM

## 2011-07-25 DIAGNOSIS — Z860100 Personal history of colon polyps, unspecified: Secondary | ICD-10-CM | POA: Insufficient documentation

## 2011-07-25 DIAGNOSIS — Z1211 Encounter for screening for malignant neoplasm of colon: Secondary | ICD-10-CM | POA: Insufficient documentation

## 2011-07-25 HISTORY — DX: Diverticulosis of large intestine without perforation or abscess without bleeding: K57.30

## 2011-07-25 MED ORDER — SODIUM CHLORIDE 0.9 % IV SOLN: 500.0000 mL | INTRAVENOUS | Status: DC

## 2011-07-25 NOTE — Progress Notes (Signed)
Patient did not experience any of the following events: a burn prior to discharge; a fall within the facility; wrong site/side/patient/procedure/implant event; or a hospital transfer or hospital admission upon discharge from the facility. (G8907) Patient did not have preoperative order for IV antibiotic SSI prophylaxis. (G8918)  

## 2011-07-25 NOTE — Patient Instructions (Signed)
YOU HAD AN ENDOSCOPIC PROCEDURE TODAY AT THE [facility]: Refer to the procedure report that was given to you for any specific questions about what was found during the examination.  If the procedure report does not answer your questions, please call your gastroenterologist to clarify.  If you requested that your care partner not be given the details of your procedure findings, then the procedure report has been included in a sealed envelope for you to review at your convenience later.  YOU SHOULD EXPECT: Some feelings of bloating in the abdomen. Passage of more gas than usual.  Walking can help get rid of the air that was put into your GI tract during the procedure and reduce the bloating. If you had a lower endoscopy (such as a colonoscopy or flexible sigmoidoscopy) you may notice spotting of blood in your stool or on the toilet paper. If you underwent a bowel prep for your procedure, then you may not have a normal bowel movement for a few days.  DIET: Your first meal following the procedure should be a light meal and then it is ok to progress to your normal diet.  A half-sandwich or bowl of soup is an example of a good first meal.  Heavy or fried foods are harder to digest and may make you feel nauseous or bloated.  Likewise meals heavy in dairy and vegetables can cause extra gas to form and this can also increase the bloating.  Drink plenty of fluids but you should avoid alcoholic beverages for 24 hours.  ACTIVITY: Your care partner should take you home directly after the procedure.  You should plan to take it easy, moving slowly for the rest of the day.  You can resume normal activity the day after the procedure however you should NOT DRIVE or use heavy machinery for 24 hours (because of the sedation medicines used during the test).    SYMPTOMS TO REPORT IMMEDIATELY: A gastroenterologist can be reached at any hour.  During normal business hours, 8:30 AM to 5:00 PM Monday through Friday,  call [phone].  After hours and on weekends, please call the GI answering service at [phone] who will take a message and have the physician on call contact you.   Following lower endoscopy (colonoscopy or flexible sigmoidoscopy):  Excessive amounts of blood in the stool  Significant tenderness or worsening of abdominal pains  Swelling of the abdomen that is new, acute  Fever of 100F or higher    FOLLOW UP: If any biopsies were taken you will be contacted by phone or by letter within the next 1-3 weeks.  Call your gastroenterologist if you have not heard about the biopsies in 3 weeks.  Our staff will call the home number listed on your records the next business day following your procedure to check on you and address any questions or concerns that you may have at that time regarding the information given to you following your procedure. This is a courtesy call and so if there is no answer at the home number and we have not heard from you through the emergency physician on call, we will assume that you have returned to your regular daily activities without incident.  SIGNATURES/CONFIDENTIALITY: You and/or your care partner have signed paperwork which will be entered into your electronic medical record.  These signatures attest to the fact that that the information above on your After Visit Summary has been reviewed and is understood.  Full responsibility of the confidentiality   of this discharge information lies with you and/or your care-partner.     

## 2011-07-25 NOTE — Progress Notes (Signed)
Pressure applied to abdomen to reach cecum.  

## 2011-07-25 NOTE — Op Note (Signed)
[facility] [address] [place], [place]  [id number]  COLONOSCOPY PROCEDURE REPORT  PATIENT:  William Montgomery, William Montgomery  MR#:  [id number] BIRTHDATE:  03-Apr-1941, 70 yrs. old  GENDER:  male ENDOSCOPIST:  Loralee Pacas. Sharlett Iles, MD, [facility] REF. BY: PROCEDURE DATE:  07/25/2011 PROCEDURE:  Colonoscopy with snare polypectomy ASA CLASS:  Class III INDICATIONS:  history of polyps MEDICATIONS:   propofol (Diprivan) 350 mg IV  DESCRIPTION OF PROCEDURE:   After the risks and benefits and of the procedure were explained, informed consent was obtained. Digital rectal exam was performed and revealed no abnormalities. The LB 180AL [id number] endoscope was introduced through the anus and advanced to the cecum, which was identified by both the appendix and ileocecal valve.  The quality of the prep was Moviprep fair. The instrument was then slowly withdrawn as the colon was fully examined. <<PROCEDUREIMAGES>>  FINDINGS:  Severe diverticulosis was found in the sigmoid to descending colon segments. HAD TO USE PEDIATRIC SCOPE TO PASS !!! A sessile polyp was found in the rectum. 5 MM FLAT RECTAL POLYP COLD SNARE REMOVED  This was otherwise a normal examination of the colon.   Retroflexed views in the rectum revealed no abnormalities.    The scope was then withdrawn from the patient and the procedure completed.  COMPLICATIONS:  None ENDOSCOPIC IMPRESSION: 1) Severe diverticulosis in the sigmoid to descending colon segments 2) Sessile polyp in the rectum 3) Otherwise normal examination RECOMMENDATIONS: 1) High fiber diet.  REPEAT EXAM:  No  ______________________________ Loralee Pacas. Sharlett Iles, MD, [facility]  CC:  Ricardo Jericho, MD  n. Lorrin MaisMarland Kitchen   Loralee Pacas. Domanick Cuccia at 07/25/2011 04:33 PM  Cecilio Asper, [phone]

## 2011-07-25 NOTE — Progress Notes (Signed)
J. Nulty, CRNA started to dose pt with Propofol and pt immediately complained of pain at iv site, more than just burning. Upon further investigation it was found that the iv had infiltrated. IV D/c'd and restarted in R hand by Lucretia Kern, CRNA

## 2011-07-26 ENCOUNTER — Telehealth: Payer: Self-pay | Admitting: *Deleted

## 2011-07-26 NOTE — Telephone Encounter (Signed)
  Follow up Call-  Call back number 07/25/2011  Post procedure Call Back phone  # [phone]  Permission to leave phone message Yes     Patient questions:  Do you have a fever, pain , or abdominal swelling? no Pain Score  0 *  Have you tolerated food without any problems? yes  Have you been able to return to your normal activities? yes  Do you have any questions about your discharge instructions: Diet   no Medications  no Follow up visit  no  Do you have questions or concerns about your Care? no  Actions: * If pain score is 4 or above: No action needed, pain <4.

## 2011-07-31 ENCOUNTER — Encounter: Payer: Self-pay | Admitting: Gastroenterology

## 2011-07-31 ENCOUNTER — Encounter: Payer: Self-pay | Admitting: Family Medicine

## 2011-08-01 ENCOUNTER — Encounter: Payer: Self-pay | Admitting: Family Medicine

## 2011-08-06 ENCOUNTER — Encounter: Payer: Self-pay | Admitting: Family Medicine

## 2011-09-04 ENCOUNTER — Other Ambulatory Visit: Payer: Self-pay | Admitting: Family Medicine

## 2011-09-06 ENCOUNTER — Other Ambulatory Visit: Payer: Self-pay

## 2011-09-06 NOTE — Telephone Encounter (Signed)
We faxed over request the same refills as the telephone note on 09-04-11

## 2011-09-10 NOTE — Telephone Encounter (Signed)
eScribe request for refill on lipitor Last seen on 06/15/11 Follow up 10/15/11 RX sent

## 2011-10-15 ENCOUNTER — Encounter: Payer: Self-pay | Admitting: Family Medicine

## 2011-10-15 ENCOUNTER — Ambulatory Visit (HOSPITAL_BASED_OUTPATIENT_CLINIC_OR_DEPARTMENT_OTHER)
Admission: RE | Admit: 2011-10-15 | Discharge: 2011-10-15 | Disposition: A | Discharge status: Home / Self Care | Payer: 59 | Source: Ambulatory Visit | Attending: Family Medicine | Admitting: Family Medicine

## 2011-10-15 ENCOUNTER — Ambulatory Visit (INDEPENDENT_AMBULATORY_CARE_PROVIDER_SITE_OTHER): Payer: 59 | Admitting: Family Medicine

## 2011-10-15 VITALS — BP 133/87 | HR 71 | Ht 69.0 in | Wt 175.0 lb

## 2011-10-15 DIAGNOSIS — M545 Low back pain: Secondary | ICD-10-CM | POA: Insufficient documentation

## 2011-10-15 DIAGNOSIS — Z23 Encounter for immunization: Secondary | ICD-10-CM | POA: Insufficient documentation

## 2011-10-15 DIAGNOSIS — R7401 Elevation of levels of liver transaminase levels: Secondary | ICD-10-CM

## 2011-10-15 DIAGNOSIS — K824 Cholesterolosis of gallbladder: Secondary | ICD-10-CM

## 2011-10-15 DIAGNOSIS — G8929 Other chronic pain: Secondary | ICD-10-CM

## 2011-10-15 DIAGNOSIS — E785 Hyperlipidemia, unspecified: Secondary | ICD-10-CM

## 2011-10-15 DIAGNOSIS — K750 Abscess of liver: Secondary | ICD-10-CM | POA: Insufficient documentation

## 2011-10-15 LAB — COMPREHENSIVE METABOLIC PANEL
ALT: 40 U/L (ref 0–53)
AST: 31 U/L (ref 0–37)
Albumin: 4.3 g/dL (ref 3.5–5.2)
Alkaline Phosphatase: 70 U/L (ref 39–117)
Calcium: 8.9 mg/dL (ref 8.4–10.5)
Chloride: 103 mEq/L (ref 96–112)
Potassium: 4.1 mEq/L (ref 3.5–5.1)
Sodium: 140 mEq/L (ref 135–145)
Total Protein: 7 g/dL (ref 6.0–8.3)

## 2011-10-15 MED ORDER — ZOSTER VACCINE LIVE 19400 UNT/0.65ML ~~LOC~~ SOLR: 0.6500 mL | Freq: Once | SUBCUTANEOUS | Status: DC

## 2011-10-15 MED ORDER — ZOSTER VACCINE LIVE 19400 UNT/0.65ML ~~LOC~~ SOLR: 0.6500 mL | Freq: Once | SUBCUTANEOUS | Status: AC

## 2011-10-15 NOTE — Assessment & Plan Note (Signed)
Ordered f/u u/s today to assure stability as per radiologist's recommendations.

## 2011-10-15 NOTE — Assessment & Plan Note (Signed)
Lipids 06/2011 were at goal. Continue current statin.  We'll recheck lipids next f/u in 4-6 mo.

## 2011-10-15 NOTE — Assessment & Plan Note (Signed)
Responding well to prn NSAID and daily amitriptyline.  Continue this.

## 2011-10-15 NOTE — Assessment & Plan Note (Addendum)
Hep B and C screening neg. Liver echogenicity c/w fatty infiltration on U/S 06/2011. GB polyp being followed up. Repeat hepatic panel today.

## 2011-10-15 NOTE — Progress Notes (Addendum)
OFFICE VISIT  10/15/2011   CC:  Chief Complaint  Patient presents with  . Follow-up    LBP, hyperlipidemia     HPI:    Patient is a 71 y.o. Caucasian male who presents for 4 mo f/u chronic LBP and hyperlipidemia. He is feeling well. Says he's doing well on amitriptyline bid, rare back ache now, says he is tolerating the dry mouth ok now. Denies constipation or urinary difficulty on it.   Takes celebrex only rarely, mainly for left knee pain intermittently. He walks about 2-3 miles per day, active in yard, home.  He never got set up with the neurosurgeon for his back, but doesn't think he needs to at this point.  He has routine cardiology f/u with Dr. Percival Spanish in the next 1-2 mo.2   Past Medical History  Diagnosis Date  . Coronary artery disease 12/22/07    chest pain, cardiac cath demontrated 95% stenosis in the proximal portion of teh LAD.  There was a praximal stent with 10-20% restenosis.  The mid LAD had 70% stenosis.  The diagonal had 80-90% stenosis.  The circumfles had luminal irregularities.  Right coronary was free of disease.  He had an EF of 65%.  He subsequently had drug-eluting stent in the proximal mid LAD.   ETT 01/30/11 was NEGATIVE.  Marland Kitchen Hyperlipidemia   . 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  . Colon polyps 07/2011    hyperplastic 07/25/2011  . Hearing loss 2004    Dr. Tamala Julian, ENT  . Hypertension   . 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 Arthritis   . Diverticulosis of sigmoid colon 07/25/11    Severe (endoscopy by Dr. Sharlett Iles)  . Gallbladder polyp 2013    Noted 06/2011.  Stable on u/s f/u 10/2011.    Past Surgical History  Procedure Date  . Back surgery     C-Spine and L-Spine  . Lobectomy     right upper lobectomy  . Colonoscopy 02/26/02; 07/2011    Colonoscopy by Dr. Rowe Pavy 2003 was normal (+hx of polyps (adenomatous?) prior),  07/2011 showed one hyperplastic polyp.  . Tonsillectomy and adenoidectomy age 47  . Cardiac catheterization   . Appendectomy 1953  . Coronary stent placement     x 2     Outpatient Prescriptions Prior to Visit  Medication Sig Dispense Refill  . amitriptyline (ELAVIL) 25 MG tablet Take 50 mg by mouth daily. 1 tab po bid for neuropathic pain      . aspirin 325 MG tablet Take 325 mg by mouth daily.        Marland Kitchen b complex vitamins tablet Take 1 tablet by mouth daily.        . celecoxib (CELEBREX) 200 MG capsule Take 1 capsule (200 mg total) by mouth 2 (two) times daily as needed.  60 capsule  3  . clopidogrel (PLAVIX) 75 MG tablet Take 1 tablet (75 mg total) by mouth daily.  90 tablet  3  . Coenzyme Q10 (COQ10) 100 MG CAPS Take 1 capsule by mouth as directed.      Marland Kitchen LIPITOR 20 MG tablet TAKE 1 TABLET BY MOUTH DAILY.  90 tablet  0  . Multiple Vitamin (MULTIVITAMIN) tablet Take 1 tablet by mouth daily.        Marland Kitchen NIACIN CR PO Take 400 mg by mouth daily.      Marland Kitchen  nitroGLYCERIN (NITROSTAT) 0.4 MG SL tablet Place 1 tablet (0.4 mg total) under the tongue every 5 (five) minutes as needed for chest pain.  30 tablet  2  . Omega-3 Fatty Acids (FISH OIL PO) Take 1 capsule by mouth daily.      . peg 3350 powder (MOVIPREP) 100 G SOLR Take 1 kit (100 g total) by mouth once.  1 kit  0    No Known Allergies  ROS As per HPI  PE: Blood pressure 133/87, pulse 71, height 5' 9"  (1.753 m), weight 175 lb (79.379 kg). Gen: Alert, well appearing.  Patient is oriented to person, place, time, and situation. Neck: supple/nontender.  No LAD, mass, or TM.  Carotid pulses 2+ bilaterally, without bruits. CV: RRR, no m/r/g.   LUNGS: CTA bilat, nonlabored resps, good aeration in all lung fields. EXT: no clubbing, cyanosis, or edema.  BACK: nontender.  ROM fully intact and without pain except mild discomfort/pulling in low back with extreme forward flexion.  No radicular pain or paresthesias.  LABS:  Lab Results  Component  Value Date   CHOL 129 06/15/2011   HDL 40.60 06/15/2011   LDLCALC 55 06/15/2011   LDLDIRECT 101.4 02/13/2011   TRIG 169.0* 06/15/2011   CHOLHDL 3 06/15/2011     Chemistry      Component Value Date/Time   NA 140 10/13/2010 0924   K 4.4 10/13/2010 0924   CL 103 10/13/2010 0924   CO2 31 10/13/2010 0924   BUN 16 10/13/2010 0924   CREATININE 1.2 10/13/2010 0924      Component Value Date/Time   CALCIUM 9.5 10/13/2010 0924   ALKPHOS 86 10/13/2010 0924   AST 43* 06/15/2011 0842   ALT 66* 06/15/2011 0842   BILITOT 1.0 10/13/2010 0924       IMPRESSION AND PLAN:  DYSLIPIDEMIA Lipids 06/2011 were at goal. Continue current statin.  We'll recheck lipids next f/u in 4-6 mo.  Chronic low back pain Responding well to prn NSAID and daily amitriptyline.  Continue this.  Gallbladder polyp Ordered f/u u/s today to assure stability as per radiologist's recommendations.  Transaminasemia Hep B and C screening neg. Liver echogenicity c/w fatty infiltration on U/S 06/2011. GB polyp being followed up. Repeat hepatic panel today.  Need for shingles vaccine Gave rx for this today for him to take to local [facility] for dispensation and injection.     FOLLOW UP: Return for office f/u in 4-6 mo to f/u transaminasemia,hyperlipidemia, chronic LBP.

## 2011-10-15 NOTE — Assessment & Plan Note (Signed)
Gave rx for this today for him to take to local [facility] for dispensation and injection.

## 2011-10-31 ENCOUNTER — Ambulatory Visit (INDEPENDENT_AMBULATORY_CARE_PROVIDER_SITE_OTHER): Payer: 59 | Admitting: *Deleted

## 2011-10-31 DIAGNOSIS — Z2911 Encounter for prophylactic immunotherapy for respiratory syncytial virus (RSV): Secondary | ICD-10-CM

## 2011-10-31 DIAGNOSIS — Z23 Encounter for immunization: Secondary | ICD-10-CM

## 2011-10-31 NOTE — Progress Notes (Signed)
Pt presents for Zostavax.  Pt supplied med through outpatient RX.  Vaccine arrived in pharmacy packing with ice packs from [facility].  Pt tolerated injection well in left arm.

## 2011-12-12 ENCOUNTER — Encounter: Payer: Self-pay | Admitting: Cardiology

## 2011-12-12 ENCOUNTER — Ambulatory Visit (INDEPENDENT_AMBULATORY_CARE_PROVIDER_SITE_OTHER): Payer: 59 | Admitting: Cardiology

## 2011-12-12 VITALS — BP 125/80 | HR 61 | Ht 69.0 in | Wt 178.0 lb

## 2011-12-12 DIAGNOSIS — I2510 Atherosclerotic heart disease of native coronary artery without angina pectoris: Secondary | ICD-10-CM

## 2011-12-12 NOTE — Patient Instructions (Addendum)
Please discontinue your Plavix.  Continue all other medications as listed  Follow up in 1 year with Dr Percival Spanish.  You will receive a letter in the mail 2 months before you are due.  Please call us when you receive this letter to schedule your follow up appointment.
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
## 2011-12-20 ENCOUNTER — Other Ambulatory Visit: Payer: Self-pay | Admitting: Family Medicine

## 2012-02-18 ENCOUNTER — Ambulatory Visit (INDEPENDENT_AMBULATORY_CARE_PROVIDER_SITE_OTHER): Payer: 59

## 2012-02-18 DIAGNOSIS — Z23 Encounter for immunization: Secondary | ICD-10-CM

## 2012-03-14 ENCOUNTER — Ambulatory Visit (INDEPENDENT_AMBULATORY_CARE_PROVIDER_SITE_OTHER): Payer: 59 | Admitting: Family Medicine

## 2012-03-14 ENCOUNTER — Encounter: Payer: Self-pay | Admitting: Family Medicine

## 2012-03-14 VITALS — BP 134/81 | HR 65 | Temp 98.0°F | Ht 69.0 in | Wt 181.0 lb

## 2012-03-14 DIAGNOSIS — M545 Low back pain: Secondary | ICD-10-CM

## 2012-03-14 MED ORDER — CYCLOBENZAPRINE HCL 5 MG PO TABS: ORAL_TABLET | ORAL | Status: DC

## 2012-03-14 NOTE — Progress Notes (Signed)
OFFICE NOTE  03/14/2012  CC:  Chief Complaint  Patient presents with  . Back Pain    aggrivated back on Wednesday working in yard     HPI: Patient is a 71 y.o. Caucasian male who is here for back pain.  Says he recently "overdid it" with doing yard work, back began hurting insidiously during work and then worsened after.  Right LB focus.  No radiation, no LE weakness.  He does have what sounds like some spasms.  Has long hx of LBP that has been recurrent but quiescent as of late.  Took 2 celebrex 200 mg caps each of the last 2 nights--no signif help.  He usually only takes one of these caps on a prn basis.   In the past, prn flexeril has been helpful for these acute flare ups, plus heat, stretching and relative rest..  Pertinent PMH:  Past Medical History  Diagnosis Date  . Coronary artery disease 12/22/07    chest pain, cardiac cath demontrated 95% stenosis in the proximal portion of the LAD.  There was a praximal stent with 10-20% restenosis.  The mid LAD had 70% stenosis.  The diagonal had 80-90% stenosis.  The circumfles had luminal irregularities.  Right coronary was free of disease.  He had an EF of 65%.  He subsequently had drug-eluting stent in the proximal mid LAD.   ETT 01/30/11 was NEGATIVE.  Marland Kitchen Hyperlipidemia   . 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  . Colon polyps 07/2011    hyperplastic 07/25/2011  . Hearing loss 2004    Dr. Tamala Julian, ENT  . Hypertension   . 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 Arthritis   . Diverticulosis of sigmoid colon 07/25/11    Severe (endoscopy by Dr. Sharlett Iles)  . Gallbladder polyp 2013    Noted 06/2011.  Stable on u/s f/u 10/2011.   Past Surgical History  Procedure Date  . Back surgery     C-Spine and L-Spine  . Lobectomy     right upper lobectomy  . Colonoscopy 02/26/02; 07/2011    Colonoscopy by Dr. Rowe Pavy 2003 was normal  (+hx of polyps (adenomatous?) prior), 07/2011 showed one hyperplastic polyp.  . Tonsillectomy and adenoidectomy age 21  . Cardiac catheterization   . Appendectomy 1953  . Coronary stent placement     x 2     MEDS:  Outpatient Prescriptions Prior to Visit  Medication Sig Dispense Refill  . amitriptyline (ELAVIL) 25 MG tablet Take 50 mg by mouth daily. 1 tab po bid for neuropathic pain      . aspirin 325 MG tablet Take 325 mg by mouth daily.        Marland Kitchen b complex vitamins tablet Take 1 tablet by mouth daily.        . celecoxib (CELEBREX) 200 MG capsule Take 1 capsule (200 mg total) by mouth 2 (two) times daily as needed.  60 capsule  3  . Coenzyme Q10 (COQ10) 100 MG CAPS Take 1 capsule by mouth as directed.      Marland Kitchen KRILL OIL PO Take 1,500 mg by mouth daily.      Marland Kitchen LIPITOR 20 MG tablet TAKE 1 TABLET BY MOUTH DAILY.  90 tablet  0  . Multiple Vitamin (MULTIVITAMIN) tablet Take 1 tablet by mouth daily.        Marland Kitchen NIACIN CR PO  Take 400 mg by mouth daily.      Marland Kitchen NITROSTAT 0.4 MG SL tablet PLACE 1 TABLET UNDER THE TONGUE EVERY 5 MINUTES AS NEEDED FOR CHEST PAIN.  30 tablet  2   Last reviewed on 03/14/2012 12:13 PM by Tammi Sou, MD  PE: Blood pressure 134/81, pulse 65, temperature 98 F (36.7 C), temperature source Temporal, height 5' 9"  (1.753 m), weight 181 lb (82.101 kg). Gen: Alert, well appearing.  Patient is oriented to person, place, time, and situation. Moves a bit stiffly from chair to exam table.  Forward flexion to 170 degrees only, due to pain.  All other ROM of L-spine intact but mild pain with right lateral bending.   Mild right L/S spine soft tissue TTP.  NO facet or spinous process TTP.  No SI joint TTP.  SLR neg bilat.  DTRs symmetric bilat.  No LE weakness or sensory deficit.  IMPRESSION AND PLAN:  Low back pain Acute exacerbation, musculoskeletal.  No red flags. Flexeril 5-61m q8h prn discussed with pt today.  Therapeutic expectations and side effect profile of medication  discussed today.  Patient's questions answered. Continue heat, LB stretches, relative rest.  He has routine chronic illness f/u set for 3d from now so we'll recheck this problem at that time.   An After Visit Summary was printed and given to the patient.  FOLLOW UP: has scheduled appt in 3d

## 2012-03-17 ENCOUNTER — Encounter: Payer: Self-pay | Admitting: Family Medicine

## 2012-03-17 ENCOUNTER — Ambulatory Visit (INDEPENDENT_AMBULATORY_CARE_PROVIDER_SITE_OTHER): Payer: 59 | Admitting: Family Medicine

## 2012-03-17 VITALS — BP 135/86 | HR 65 | Ht 69.0 in | Wt 180.0 lb

## 2012-03-17 DIAGNOSIS — M545 Low back pain: Secondary | ICD-10-CM

## 2012-03-17 DIAGNOSIS — I10 Essential (primary) hypertension: Secondary | ICD-10-CM

## 2012-03-17 DIAGNOSIS — E785 Hyperlipidemia, unspecified: Secondary | ICD-10-CM

## 2012-03-17 LAB — LIPID PANEL
Cholesterol: 137 mg/dL (ref 0–200)
Total CHOL/HDL Ratio: 3
Triglycerides: 202 mg/dL — ABNORMAL HIGH (ref 0.0–149.0)

## 2012-03-17 LAB — LDL CHOLESTEROL, DIRECT: Direct LDL: 71.5 mg/dL

## 2012-03-17 NOTE — Assessment & Plan Note (Signed)
Acute exacerbation, musculoskeletal.  No red flags. Flexeril 5-22m q8h prn discussed with pt today.  Therapeutic expectations and side effect profile of medication discussed today.  Patient's questions answered. Continue heat, LB stretches, relative rest.  He has routine chronic illness f/u set for 3d from now so we'll recheck this problem at that time.

## 2012-03-17 NOTE — Assessment & Plan Note (Signed)
Lab Results  Component Value Date   CHOL 129 06/15/2011   HDL 40.60 06/15/2011   LDLCALC 55 06/15/2011   LDLDIRECT 101.4 02/13/2011   TRIG 169.0* 06/15/2011   CHOLHDL 3 06/15/2011   Recheck FLP today, AST and ALT as well.

## 2012-03-17 NOTE — Assessment & Plan Note (Signed)
Recent acute exacerbation of musculoskeletal LBP--improving appropriately with rest, heat, stretches, and prn flexeril.

## 2012-03-17 NOTE — Progress Notes (Signed)
OFFICE NOTE  03/17/2012  CC:  Chief Complaint  William Montgomery presents with  . Follow-up    HTN, Hyperlipid     HPI: William Montgomery is a 71 y.o. Caucasian male who is here for 5 mo f/u HTN, hyperlipidemia, and also acute f/u for recent exacerbation of low back pain--flexeril rx'd at that time.  Back is getting better, he can bend better.  Takes 1-2 flexeril q8h and it is helping.  He is trying to stay moving, doing stretches.  No radicular sx's or weakness. Tolerating statin well, compliant with med.    ROS: no chest pain, no SOB, no palpitations, no dizziness, no abd pain, no jaundice, no n/v/d, no problem with diffuse myalgias or muscle weakness.  Pertinent PMH:  Past Medical History  Diagnosis Date  . Coronary artery disease 12/22/07    chest pain, cardiac cath demontrated 95% stenosis in the proximal portion of the LAD.  There was a praximal stent with 10-20% restenosis.  The mid LAD had 70% stenosis.  The diagonal had 80-90% stenosis.  The circumfles had luminal irregularities.  Right coronary was free of disease.  He had an EF of 65%.  He subsequently had drug-eluting stent in the proximal mid LAD.   ETT 01/30/11 was NEGATIVE.  Marland Kitchen Hyperlipidemia   . 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  . Colon polyps 07/2011    hyperplastic 07/25/2011  . Hearing loss 2004    Dr. Tamala Julian, ENT  . Hypertension   . 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 Arthritis   . Diverticulosis of sigmoid colon 07/25/11    Severe (endoscopy by Dr. Sharlett Iles)  . Gallbladder polyp 2013    Noted 06/2011.  Stable on u/s f/u 10/2011.   Past surgical, social, and family history reviewed and no changes noted since last office visit.  MEDS:  Outpatient Prescriptions Prior to Visit  Medication Sig Dispense Refill  . amitriptyline (ELAVIL) 25 MG tablet Take 50 mg by mouth daily. 1 tab po bid for neuropathic pain       . aspirin 325 MG tablet Take 325 mg by mouth daily.        Marland Kitchen b complex vitamins tablet Take 1 tablet by mouth daily.        . celecoxib (CELEBREX) 200 MG capsule Take 1 capsule (200 mg total) by mouth 2 (two) times daily as needed.  60 capsule  3  . Coenzyme Q10 (COQ10) 100 MG CAPS Take 1 capsule by mouth as directed.      . cyclobenzaprine (FLEXERIL) 5 MG tablet 1-2 tabs every 8 hours as needed for muscle spasms  30 tablet  1  . KRILL OIL PO Take 1,500 mg by mouth daily.      Marland Kitchen LIPITOR 20 MG tablet TAKE 1 TABLET BY MOUTH DAILY.  90 tablet  0  . Multiple Vitamin (MULTIVITAMIN) tablet Take 1 tablet by mouth daily.        Marland Kitchen NIACIN CR PO Take 400 mg by mouth daily.      Marland Kitchen NITROSTAT 0.4 MG SL tablet PLACE 1 TABLET UNDER THE TONGUE EVERY 5 MINUTES AS NEEDED FOR CHEST PAIN.  30 tablet  2   Last reviewed on 03/17/2012  8:14 AM by Tammi Sou, MD  PE: Blood pressure 135/86, pulse 65, height 5' 9"  (1.753 m), weight 180 lb (81.647 kg). Gen: Alert,  well appearing.  William Montgomery is oriented to person, place, time, and situation. AFFECT: pleasant, lucid thought and speech. Neck: supple/nontender.  No LAD, mass, or TM.  Carotid pulses 2+ bilaterally, without bruits. CV: RRR, no m/r/g.   LUNGS: CTA bilat, nonlabored resps, good aeration in all lung fields. EXT: no clubbing, cyanosis, or edema.   IMPRESSION AND PLAN:  DYSLIPIDEMIA Lab Results  Component Value Date   CHOL 129 06/15/2011   HDL 40.60 06/15/2011   LDLCALC 55 06/15/2011   LDLDIRECT 101.4 02/13/2011   TRIG 169.0* 06/15/2011   CHOLHDL 3 06/15/2011   Recheck FLP today, AST and ALT as well.  HTN (hypertension) Stable, currently on no meds.  Continue diet and push exercise.  Low back pain Recent acute exacerbation of musculoskeletal LBP--improving appropriately with rest, heat, stretches, and prn flexeril.   An After Visit Summary was printed and given to the William Montgomery.   FOLLOW UP: 6 mo

## 2012-03-17 NOTE — Assessment & Plan Note (Signed)
Stable, currently on no meds.  Continue diet and push exercise.

## 2012-03-26 ENCOUNTER — Other Ambulatory Visit: Payer: Self-pay | Admitting: Family Medicine

## 2012-03-26 NOTE — Telephone Encounter (Signed)
eScribe request for refill on CELEBREX Last filled - 06/19/11, #60 X 3 Last seen on - 03/17/12 Follow up - 09/15/12 Please advise quantity and refills.

## 2012-04-17 DIAGNOSIS — H2510 Age-related nuclear cataract, unspecified eye: Secondary | ICD-10-CM | POA: Diagnosis not present

## 2012-07-02 ENCOUNTER — Other Ambulatory Visit: Payer: Self-pay | Admitting: Family Medicine

## 2012-07-02 NOTE — Telephone Encounter (Signed)
eScribe request for refill on AMITRIPTYLINE Last filled - 06/15/11, #180 X 3 Last seen on - 03/17/12 Follow up - 09/15/12 Please advise refills.  Thanks.

## 2012-09-15 ENCOUNTER — Encounter: Payer: Self-pay | Admitting: Family Medicine

## 2012-09-15 ENCOUNTER — Ambulatory Visit (INDEPENDENT_AMBULATORY_CARE_PROVIDER_SITE_OTHER): Payer: 59 | Admitting: Family Medicine

## 2012-09-15 VITALS — BP 135/83 | HR 62 | Temp 97.8°F | Resp 14 | Wt 173.8 lb

## 2012-09-15 DIAGNOSIS — N400 Enlarged prostate without lower urinary tract symptoms: Secondary | ICD-10-CM | POA: Insufficient documentation

## 2012-09-15 DIAGNOSIS — I10 Essential (primary) hypertension: Secondary | ICD-10-CM

## 2012-09-15 DIAGNOSIS — I2510 Atherosclerotic heart disease of native coronary artery without angina pectoris: Secondary | ICD-10-CM

## 2012-09-15 DIAGNOSIS — M545 Low back pain: Secondary | ICD-10-CM

## 2012-09-15 DIAGNOSIS — G8929 Other chronic pain: Secondary | ICD-10-CM

## 2012-09-15 DIAGNOSIS — E785 Hyperlipidemia, unspecified: Secondary | ICD-10-CM

## 2012-09-15 MED ORDER — TAMSULOSIN HCL 0.4 MG PO CAPS: ORAL_CAPSULE | ORAL | Status: DC

## 2012-09-15 MED ORDER — ATORVASTATIN CALCIUM 20 MG PO TABS: 20.0000 mg | ORAL_TABLET | Freq: Every day | ORAL | Status: DC

## 2012-09-15 NOTE — Progress Notes (Signed)
OFFICE NOTE  09/15/2012  CC:  Chief Complaint  Patient presents with  . Follow-up    6-mth. [HTN; Hyperlipidemia]     HPI: Patient is a 72 y.o. Caucasian male who is here for 6 mo f/u hyperlipidemia, CAD w/hx of stent, hx of HTN that has been under decent control w/out meds.   Feeling good, compliant with all meds.  Back feeling fine--he gets a lot of improvement with daily amitriptyline.  Complains of about 1 yr of gradually worsening urine voiding symptoms.  +Urgency, +frequency, + nocturia x 2, occ burning with urination, + stream starts and stops commonly, occ feeling of incomplete bladder emptying.  He drinks 1 cup coffee per day, no other caffeine.  Recent cardiology f/u went well, was told to f/u in 1 yr, no changes in his treatment were made.    Pertinent PMH:  Past Medical History  Diagnosis Date  . Coronary artery disease 12/22/07    chest pain, cardiac cath demontrated 95% stenosis in the proximal portion of the LAD.  There was a praximal stent with 10-20% restenosis.  The mid LAD had 70% stenosis.  The diagonal had 80-90% stenosis.  The circumfles had luminal irregularities.  Right coronary was free of disease.  He had an EF of 65%.  He subsequently had drug-eluting stent in the proximal mid LAD.   ETT 01/30/11 was NEGATIVE.  Marland Kitchen Hyperlipidemia   . 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  . Colon polyps 07/2011    hyperplastic 07/25/2011  . Hearing loss 2004    Dr. Tamala Julian, ENT  . Hypertension   . 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 Arthritis   . Diverticulosis of sigmoid colon 07/25/11    Severe (endoscopy by Dr. Sharlett Iles)  . Gallbladder polyp 2013    Noted 06/2011.  Stable on u/s f/u 10/2011.    MEDS:  Outpatient Prescriptions Prior to Visit  Medication Sig Dispense Refill  . amitriptyline (ELAVIL) 25 MG tablet Take 50 mg by mouth daily. 1 tab po  bid for neuropathic pain      . amitriptyline (ELAVIL) 25 MG tablet TAKE 1 TABLET BY MOUTH 2 TIMES A DAY FOR NEUROPATHIC PAIN  180 tablet  3  . aspirin 325 MG tablet Take 325 mg by mouth daily.        Marland Kitchen b complex vitamins tablet Take 1 tablet by mouth daily.        . CELEBREX 200 MG capsule TAKE 1 CAPSULE (200 MG TOTAL) BY MOUTH TWICE DAILY AS NEEDED.  60 capsule  3  . Coenzyme Q10 (COQ10) 100 MG CAPS Take 1 capsule by mouth as directed.      . cyclobenzaprine (FLEXERIL) 5 MG tablet 1-2 tabs every 8 hours as needed for muscle spasms  30 tablet  1  . KRILL OIL PO Take 1,500 mg by mouth daily.      Marland Kitchen LIPITOR 20 MG tablet TAKE 1 TABLET BY MOUTH DAILY.  90 tablet  0  . Multiple Vitamin (MULTIVITAMIN) tablet Take 1 tablet by mouth daily.        Marland Kitchen NIACIN CR PO Take 400 mg by mouth daily.      Marland Kitchen NITROSTAT 0.4 MG SL tablet PLACE 1 TABLET UNDER THE TONGUE EVERY 5 MINUTES AS NEEDED FOR CHEST PAIN.  30 tablet  2   No facility-administered medications prior  to visit.    PE: Weight 173 lb 12 oz (78.812 kg). Gen: Alert, well appearing.  Patient is oriented to person, place, time, and situation. DRE: anal tone normal, no rectal mass.   Prostate size feels minimally enlarged.  No nodularity, asymmetry, or tenderness.  IMPRESSION AND PLAN:  DYSLIPIDEMIA Doing fine on lipitor 72m. Lab Results  Component Value Date   CHOL 137 03/17/2012   HDL 39.80 03/17/2012   LDLCALC 55 06/15/2011   LDLDIRECT 71.5 03/17/2012   TRIG 202.0* 03/17/2012   CHOLHDL 3 03/17/2012   Wants to change to generic lipitor so I did this today. We'll recheck lipid panel again around 03/2013.  Chronic low back pain Stable, doing well lately.  Says amitriptyline is very helpful for this.  Continue this med, prn celebrex and prn cyclobenzaprine as well.  CAD (coronary artery disease) Asymptomatic. Continue statin, aspirin, and niacin. Keep appropriate f/u with cardiology (next March/april).  HTN (hypertension) Stable off  meds.  Continue to push activity level, DASH diet.  Benign prostatic hypertrophy With outflow obstruction symptoms: pt does want to do trial of flomax. Start 0.495mqhs, may increase to 0.8 mg qhs if no signif improvement in symptoms after 1-2 weeks.  Therapeutic expectations and side effect profile of medication discussed today.  Patient's questions answered. Lab Results  Component Value Date   PSA 2.34 02/13/2011   Will check PSA at next f/u for CPE/labs in 6 months.    An After Visit Summary was printed and given to the patient.  FOLLOW UP: 39m73moPE with fasting labs + psa

## 2012-09-15 NOTE — Assessment & Plan Note (Signed)
Stable, doing well lately.  Says amitriptyline is very helpful for this.  Continue this med, prn celebrex and prn cyclobenzaprine as well.

## 2012-09-15 NOTE — Assessment & Plan Note (Signed)
Asymptomatic. Continue statin, aspirin, and niacin. Keep appropriate f/u with cardiology (next March/april).

## 2012-09-15 NOTE — Assessment & Plan Note (Signed)
Doing fine on lipitor 92m. Lab Results  Component Value Date   CHOL 137 03/17/2012   HDL 39.80 03/17/2012   LDLCALC 55 06/15/2011   LDLDIRECT 71.5 03/17/2012   TRIG 202.0* 03/17/2012   CHOLHDL 3 03/17/2012   Wants to change to generic lipitor so I did this today. We'll recheck lipid panel again around 03/2013.

## 2012-09-15 NOTE — Assessment & Plan Note (Addendum)
Stable off meds.  Continue to push activity level, DASH diet.

## 2012-09-15 NOTE — Assessment & Plan Note (Signed)
With outflow obstruction symptoms: pt does want to do trial of flomax. Start 0.28m qhs, may increase to 0.8 mg qhs if no signif improvement in symptoms after 1-2 weeks.  Therapeutic expectations and side effect profile of medication discussed today.  Patient's questions answered. Lab Results  Component Value Date   PSA 2.34 02/13/2011   Will check PSA at next f/u for CPE/labs in 6 months.

## 2013-01-28 ENCOUNTER — Ambulatory Visit (INDEPENDENT_AMBULATORY_CARE_PROVIDER_SITE_OTHER): Payer: 59 | Admitting: Cardiology

## 2013-01-28 ENCOUNTER — Encounter: Payer: Self-pay | Admitting: Cardiology

## 2013-01-28 VITALS — BP 127/83 | HR 68 | Ht 69.0 in | Wt 176.0 lb

## 2013-01-28 DIAGNOSIS — I2510 Atherosclerotic heart disease of native coronary artery without angina pectoris: Secondary | ICD-10-CM | POA: Diagnosis not present

## 2013-01-28 DIAGNOSIS — I10 Essential (primary) hypertension: Secondary | ICD-10-CM | POA: Diagnosis not present

## 2013-01-28 NOTE — Progress Notes (Signed)
HPI The patient returns for follow up of CAD.  Since I last saw him he has done well.  He continues to be very active. He walks. He does yard work. With this he denies any cardiovascular symptoms. The patient denies any new symptoms such as chest discomfort, neck or arm discomfort. There has been no new shortness of breath, PND or orthopnea. There have been no reported palpitations, presyncope or syncope.  No Known Allergies  Current Outpatient Prescriptions  Medication Sig Dispense Refill  . amitriptyline (ELAVIL) 25 MG tablet TAKE 1 TABLET BY MOUTH 2 TIMES A DAY FOR NEUROPATHIC PAIN  180 tablet  3  . aspirin 325 MG tablet Take 325 mg by mouth daily.        Marland Kitchen atorvastatin (LIPITOR) 20 MG tablet Take 1 tablet (20 mg total) by mouth daily.  90 tablet  3  . b complex vitamins tablet Take 1 tablet by mouth daily.        . CELEBREX 200 MG capsule TAKE 1 CAPSULE (200 MG TOTAL) BY MOUTH TWICE DAILY AS NEEDED.  60 capsule  3  . Coenzyme Q10 (COQ10) 100 MG CAPS Take 1 capsule by mouth as directed.      . cyclobenzaprine (FLEXERIL) 5 MG tablet 1-2 tabs every 8 hours as needed for muscle spasms  30 tablet  1  . KRILL OIL PO Take 1,500 mg by mouth daily.      . Multiple Vitamin (MULTIVITAMIN) tablet Take 1 tablet by mouth daily.        Marland Kitchen NIACIN CR PO Take 400 mg by mouth daily.      Marland Kitchen NITROSTAT 0.4 MG SL tablet PLACE 1 TABLET UNDER THE TONGUE EVERY 5 MINUTES AS NEEDED FOR CHEST PAIN.  30 tablet  2  . tamsulosin (FLOMAX) 0.4 MG CAPS 1-2 tabs po qhs prn  90 capsule  3   No current facility-administered medications for this visit.    Past Medical History  Diagnosis Date  . Coronary artery disease 12/22/07    chest pain, cardiac cath demontrated 95% stenosis in the proximal portion of the LAD.  There was a praximal stent with 10-20% restenosis.  The mid LAD had 70% stenosis.  The diagonal had 80-90% stenosis.  The circumfles had luminal irregularities.  Right coronary was free of disease.  He had an EF  of 65%.  He subsequently had drug-eluting stent in the proximal mid LAD.   ETT 01/30/11 was NEGATIVE.  Marland Kitchen Hyperlipidemia   . 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  . Colon polyps 07/2011    hyperplastic 07/25/2011  . Hearing loss 2004    Dr. Tamala Julian, ENT  . Hypertension   . 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 Arthritis   . Diverticulosis of sigmoid colon 07/25/11    Severe (endoscopy by Dr. Sharlett Iles)  . Gallbladder polyp 2013    Noted 06/2011.  Stable on u/s f/u 10/2011.    Past Surgical History  Procedure Laterality Date  . Back surgery      C-Spine and L-Spine  . Lobectomy      right upper lobectomy  . Colonoscopy  02/26/02; 07/2011    Colonoscopy by Dr. Rowe Pavy 2003 was normal (+hx of polyps (adenomatous?) prior), 07/2011 showed one hyperplastic polyp.  . Tonsillectomy and adenoidectomy  age 70  . Cardiac catheterization    .  Appendectomy  1953  . Coronary stent placement      x 2     ROS:  Decreased hearing, back pain.  Otherwise as stated in the HPI and negative for all other systems.  PHYSICAL EXAM BP 127/83  Pulse 68  Ht 5' 9"  (1.753 m)  Wt 176 lb (79.833 kg)  BMI 25.98 kg/m2 GENERAL:  Well appearing HEENT:  Pupils equal round and reactive, fundi not visualized, oral mucosa unremarkable NECK:  No jugular venous distention, waveform within normal limits, carotid upstroke brisk and symmetric, no bruits, no thyromegaly LUNGS:  Clear to auscultation bilaterally BACK:  No CVA tenderness CHEST:  Thoracotomy scar    HEART:  PMI not displaced or sustained,S1 and S2 within normal limits, no S3, no S4, no clicks, no rubs, no murmurs ABD:  Flat, positive bowel sounds normal in frequency in pitch, no bruits, no rebound, no guarding, no midline pulsatile mass, no hepatomegaly, no splenomegaly EXT:  2 plus pulses throughout, no edema, no cyanosis no  clubbing   EKG:  Sinus rhythm, rate 62, axis within normal limits, intervals within normal limits, no acute ST-T wave changes, LVH by voltage criteria. 01/28/2013  ASSESSMEN the  AND PLAN  CAD - The patient has no new sypmtoms.  No further cardiovascular testing is indicated.  We will continue with aggressive risk reduction and meds as listed.Marland Kitchen He had a negative stress test 2012.   DYSLIPIDEMIA -   His  LDL was 71.5 earlier this year. No change in therapy is indicated.   HTN (hypertension) -  The blood pressure is at target. No change in medications is indicated. We will continue with therapeutic lifestyle changes (TLC).

## 2013-01-28 NOTE — Patient Instructions (Addendum)
The current medical regimen is effective;  continue present plan and medications.  Follow up in 1 year with Dr Hochrein.  You will receive a letter in the mail 2 months before you are due.  Please call us when you receive this letter to schedule your follow up appointment.  

## 2013-01-30 ENCOUNTER — Ambulatory Visit (INDEPENDENT_AMBULATORY_CARE_PROVIDER_SITE_OTHER): Payer: 59

## 2013-01-30 DIAGNOSIS — Z23 Encounter for immunization: Secondary | ICD-10-CM

## 2013-03-18 ENCOUNTER — Encounter: Payer: Self-pay | Admitting: Family Medicine

## 2013-03-18 ENCOUNTER — Ambulatory Visit (INDEPENDENT_AMBULATORY_CARE_PROVIDER_SITE_OTHER): Payer: 59 | Admitting: Family Medicine

## 2013-03-18 ENCOUNTER — Other Ambulatory Visit: Payer: Self-pay | Admitting: Family Medicine

## 2013-03-18 VITALS — BP 126/80 | HR 60 | Temp 97.2°F | Resp 18 | Ht 69.0 in | Wt 177.0 lb

## 2013-03-18 DIAGNOSIS — Z0000 Encounter for general adult medical examination without abnormal findings: Secondary | ICD-10-CM

## 2013-03-18 DIAGNOSIS — Z0389 Encounter for observation for other suspected diseases and conditions ruled out: Secondary | ICD-10-CM

## 2013-03-18 LAB — CBC WITH DIFFERENTIAL/PLATELET
Basophils Absolute: 0 10*3/uL (ref 0.0–0.1)
Basophils Relative: 0 % (ref 0–1)
Eosinophils Absolute: 0.3 10*3/uL (ref 0.0–0.7)
Eosinophils Relative: 5 % (ref 0–5)
HCT: 44.3 % (ref 39.0–52.0)
Hemoglobin: 15.6 g/dL (ref 13.0–17.0)
Lymphocytes Relative: 29 % (ref 12–46)
Lymphs Abs: 1.5 10*3/uL (ref 0.7–4.0)
MCH: 32.9 pg (ref 26.0–34.0)
MCHC: 35.2 g/dL (ref 30.0–36.0)
MCV: 93.5 fL (ref 78.0–100.0)
Monocytes Absolute: 0.3 10*3/uL (ref 0.1–1.0)
Monocytes Relative: 6 % (ref 3–12)
Neutro Abs: 2.9 10*3/uL (ref 1.7–7.7)
Neutrophils Relative %: 60 % (ref 43–77)
Platelets: 203 10*3/uL (ref 150–400)
RBC: 4.74 MIL/uL (ref 4.22–5.81)
RDW: 12.9 % (ref 11.5–15.5)
WBC: 5 10*3/uL (ref 4.0–10.5)

## 2013-03-18 LAB — LIPID PANEL
Cholesterol: 220 mg/dL — ABNORMAL HIGH (ref 0–200)
HDL: 36 mg/dL — ABNORMAL LOW (ref >39)
Total CHOL/HDL Ratio: 6.1 Ratio
Triglycerides: 299 mg/dL — ABNORMAL HIGH (ref <150)

## 2013-03-18 LAB — COMPREHENSIVE METABOLIC PANEL
ALT: 82 U/L — ABNORMAL HIGH (ref 0–53)
Albumin: 4.8 g/dL (ref 3.5–5.2)
CO2: 26 mEq/L (ref 19–32)
Chloride: 99 mEq/L (ref 96–112)
Creat: 0.93 mg/dL (ref 0.50–1.35)
Glucose, Bld: 96 mg/dL (ref 70–99)
Sodium: 136 mEq/L (ref 135–145)
Total Bilirubin: 0.5 mg/dL (ref 0.3–1.2)
Total Protein: 7.5 g/dL (ref 6.0–8.3)

## 2013-03-18 LAB — PSA: PSA: 3.03 ng/mL (ref <=4.00)

## 2013-03-18 LAB — TSH: TSH: 3.192 u[IU]/mL (ref 0.350–4.500)

## 2013-03-18 NOTE — Progress Notes (Signed)
Office Note 03/18/2013  CC:  Chief Complaint  Patient presents with  . Annual Exam    HPI:  William Montgomery is a 72 y.o. White male who is here for CPE. Last saw him 6 mo ago.  Started him on flomax at that time for bph and he says 0.31m qhs is working fine.  No DRE needed today.   Past Medical History  Diagnosis Date  . Coronary artery disease 12/22/07    chest pain, cardiac cath demontrated 95% stenosis in the proximal portion of the LAD.  There was a praximal stent with 10-20% restenosis.  The mid LAD had 70% stenosis.  The diagonal had 80-90% stenosis.  The circumfles had luminal irregularities.  Right coronary was free of disease.  He had an EF of 65%.  He subsequently had drug-eluting stent in the proximal mid LAD.   ETT 01/30/11 was NEGATIVE.  .Marland KitchenHyperlipidemia   . 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  . Colon polyps 07/2011    hyperplastic 07/25/2011  . Hearing loss 2004    Dr. STamala Julian ENT  . 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 Arthritis   . Diverticulosis of sigmoid colon 07/25/11    Severe (endoscopy by Dr. PSharlett Iles  . Gallbladder polyp 2013    Noted 06/2011.  Stable on u/s f/u 10/2011.    Past Surgical History  Procedure Laterality Date  . Back surgery      C-Spine and L-Spine  . Lobectomy      right upper lobectomy  . Colonoscopy  02/26/02; 07/2011    Colonoscopy by Dr. ARowe Pavy2003 was normal (+hx of polyps (adenomatous?) prior), 07/2011 showed one hyperplastic polyp.  . Tonsillectomy and adenoidectomy  age [age] . Cardiac catheterization    . Appendectomy  1953  . Coronary stent placement      x 2     Family History  Problem Relation Age of Onset  . Cervical cancer Mother     deceased  . Cirrhosis Father   . Colon cancer Neg Hx     History   Social History  . Marital Status: Married    Spouse Name: N/A    Number of Children: 2   . Years of Education: N/A   Occupational History  . Retired    Social History Main Topics  . Smoking status: Former Smoker    Quit date: 05/08/1975  . Smokeless tobacco: Former [employer] developer   Types: Snuff    Quit date: 05/07/2000     Comment: Scoal  . Alcohol Use: Yes     Comment: 1 beer every 6 months  . Drug Use: No  . Sexual Activity: Not on file   Other Topics Concern  . Not on file   Social History Narrative   Married, 2 adult children [place]   [place]     Occ: Retired from [employer] at age 72   Tob (smoke and chew) x 20 yrs, quit approx 1980s.   Alcohol: very rare.   One cup coffee each morning.   Exercise: walks 3 miles about 4 times per week.   Active lifestyle.    Outpatient Prescriptions Prior to Visit  Medication Sig Dispense Refill  . amitriptyline (ELAVIL) 25 MG tablet TAKE 1 TABLET BY MOUTH 2 TIMES A DAY FOR NEUROPATHIC PAIN  180 tablet  3  . aspirin 325 MG tablet Take 325 mg by mouth daily.        Marland Kitchen atorvastatin (LIPITOR) 20 MG tablet Take 1 tablet (20 mg total) by mouth daily.  90 tablet  3  . b complex vitamins tablet Take 1 tablet by mouth daily.        . CELEBREX 200 MG capsule TAKE 1 CAPSULE (200 MG TOTAL) BY MOUTH TWICE DAILY AS NEEDED.  60 capsule  3  . Coenzyme Q10 (COQ10) 100 MG CAPS Take 1 capsule by mouth as directed.      Marland Kitchen KRILL OIL PO Take 1,500 mg by mouth daily.      . Multiple Vitamin (MULTIVITAMIN) tablet Take 1 tablet by mouth daily.        Marland Kitchen NIACIN CR PO Take 400 mg by mouth daily.      . tamsulosin (FLOMAX) 0.4 MG CAPS 1-2 tabs po qhs prn  90 capsule  3  . cyclobenzaprine (FLEXERIL) 5 MG tablet 1-2 tabs every 8 hours as needed for muscle spasms  30 tablet  1  . NITROSTAT 0.4 MG SL tablet PLACE 1 TABLET UNDER THE TONGUE EVERY 5 MINUTES AS NEEDED FOR CHEST PAIN.  30 tablet  2   No facility-administered medications prior to visit.    No Known Allergies  ROS Review of Systems  Constitutional:  Negative for fever, chills, appetite change and fatigue.  HENT: Negative for congestion, dental problem, ear pain and sore throat.   Eyes: Negative for discharge, redness and visual disturbance.  Respiratory: Negative for cough, chest tightness, shortness of breath and wheezing.   Cardiovascular: Negative for chest pain, palpitations and leg swelling.  Gastrointestinal: Negative for nausea, vomiting, abdominal pain, diarrhea and blood in stool.  Genitourinary: Negative for dysuria, urgency, frequency, hematuria, flank pain and difficulty urinating.  Musculoskeletal: Positive for back pain (occasional MS LBP--lasts a few days and resolves). Negative for arthralgias, joint swelling, myalgias and neck stiffness.  Skin: Negative for pallor and rash.  Neurological: Negative for dizziness, speech difficulty, weakness and headaches.  Hematological: Negative for adenopathy. Does not bruise/bleed easily.  Psychiatric/Behavioral: Negative for confusion and sleep disturbance. The patient is not nervous/anxious.      PE; Blood pressure 126/80, pulse 60, temperature 97.2 F (36.2 C), temperature source Temporal, resp. rate 18, height 5' 9"  (1.753 m), weight 177 lb (80.287 kg), SpO2 98.00%. Gen: Alert, well appearing.  Patient is oriented to person, place, time, and situation. AFFECT: pleasant, lucid thought and speech. ENT: Ears: EACs clear, normal epithelium.  TMs with good light reflex and landmarks bilaterally.  Eyes: no injection, icteris, swelling, or exudate.  EOMI, PERRLA. Nose: no drainage or turbinate edema/swelling.  No injection or focal lesion.  Mouth: lips without lesion/swelling.  Oral mucosa pink and moist.  Dentition intact and without obvious caries or gingival swelling.  Oropharynx without erythema, exudate, or swelling.  Neck: supple/nontender.  No LAD, mass, or TM.  Carotid pulses 2+ bilaterally, without bruits. CV: RRR, no m/r/g.   LUNGS: CTA bilat, nonlabored resps, good aeration in  all lung fields. ABD: soft, NT, ND, BS normal.  No hepatospenomegaly or mass.  No bruits. EXT: no clubbing, cyanosis, or edema.  Musculoskeletal: no joint swelling, erythema, warmth, or tenderness.  ROM of all joints intact. Skin - no sores or suspicious lesions or rashes or color changes Rectal: deferred (was done 6 mo ago)  Pertinent labs:  None today  ASSESSMENT AND PLAN:   Health  maintenance examination Reviewed age and gender appropriate health maintenance issues (prudent diet, regular exercise, health risks of tobacco and excessive alcohol, use of seatbelts, fire alarms in home, use of sunscreen).  Also reviewed age and gender appropriate health screening as well as vaccine recommendations. UTD on colon cancer screening. DRE normal (except mild diffuse enlargement) 6 mo ago. Vaccines UTD. Will do HM labs + PSA today.  BPH w/ obstructive sx's: much improved with 0.79m flomax   Continue this.  An After Visit Summary was printed and given to the patient.  FOLLOW UP:  Return in about 6 months (around 09/15/2013) for f/u chronic illness.

## 2013-03-18 NOTE — Assessment & Plan Note (Signed)
Reviewed age and gender appropriate health maintenance issues (prudent diet, regular exercise, health risks of tobacco and excessive alcohol, use of seatbelts, fire alarms in home, use of sunscreen).  Also reviewed age and gender appropriate health screening as well as vaccine recommendations. UTD on colon cancer screening. DRE normal (except mild diffuse enlargement) 6 mo ago. Vaccines UTD. Will do HM labs + PSA today.

## 2013-03-19 ENCOUNTER — Encounter: Payer: Self-pay | Admitting: Family Medicine

## 2013-03-20 MED ORDER — ATORVASTATIN CALCIUM 40 MG PO TABS: 40.0000 mg | ORAL_TABLET | Freq: Every day | ORAL | Status: DC

## 2013-03-20 NOTE — Addendum Note (Signed)
Addended by: Ralph Dowdy on: 03/20/2013 04:24 PM   Modules accepted: Orders

## 2013-05-08 ENCOUNTER — Encounter: Payer: Self-pay | Admitting: Family Medicine

## 2013-05-08 ENCOUNTER — Other Ambulatory Visit: Payer: Self-pay | Admitting: Family Medicine

## 2013-05-08 MED ORDER — TAMSULOSIN HCL 0.4 MG PO CAPS: ORAL_CAPSULE | ORAL | Status: DC

## 2013-05-08 NOTE — Telephone Encounter (Signed)
Patient requested refill via MyChart.  I sent in 90 day supply.

## 2013-05-20 ENCOUNTER — Other Ambulatory Visit (INDEPENDENT_AMBULATORY_CARE_PROVIDER_SITE_OTHER): Payer: 59

## 2013-05-20 DIAGNOSIS — E785 Hyperlipidemia, unspecified: Secondary | ICD-10-CM

## 2013-05-20 LAB — HEPATIC FUNCTION PANEL
ALT: 70 U/L — ABNORMAL HIGH (ref 0–53)
AST: 58 U/L — ABNORMAL HIGH (ref 0–37)
Albumin: 4.5 g/dL (ref 3.5–5.2)
Alkaline Phosphatase: 75 U/L (ref 39–117)
Bilirubin, Direct: 0.2 mg/dL (ref 0.0–0.3)
Total Bilirubin: 1 mg/dL (ref 0.3–1.2)
Total Protein: 7.3 g/dL (ref 6.0–8.3)

## 2013-05-20 LAB — LIPID PANEL
CHOLESTEROL: 105 mg/dL (ref 0–200)
HDL: 37.8 mg/dL — ABNORMAL LOW (ref >39.00)
TRIGLYCERIDES: 220 mg/dL — AB (ref 0.0–149.0)
Total CHOL/HDL Ratio: 3
VLDL: 44 mg/dL — ABNORMAL HIGH (ref 0.0–40.0)

## 2013-05-20 LAB — LDL CHOLESTEROL, DIRECT: Direct LDL: 46.1 mg/dL

## 2013-06-04 ENCOUNTER — Encounter (INDEPENDENT_AMBULATORY_CARE_PROVIDER_SITE_OTHER): Payer: 59 | Admitting: Ophthalmology

## 2013-06-04 DIAGNOSIS — H3560 Retinal hemorrhage, unspecified eye: Secondary | ICD-10-CM

## 2013-06-04 DIAGNOSIS — H4310 Vitreous hemorrhage, unspecified eye: Secondary | ICD-10-CM

## 2013-06-04 DIAGNOSIS — H3530 Unspecified macular degeneration: Secondary | ICD-10-CM

## 2013-06-04 DIAGNOSIS — H2510 Age-related nuclear cataract, unspecified eye: Secondary | ICD-10-CM

## 2013-07-06 ENCOUNTER — Other Ambulatory Visit: Payer: Self-pay | Admitting: Family Medicine

## 2013-07-16 ENCOUNTER — Encounter (INDEPENDENT_AMBULATORY_CARE_PROVIDER_SITE_OTHER): Payer: 59 | Admitting: Ophthalmology

## 2013-07-16 DIAGNOSIS — H3530 Unspecified macular degeneration: Secondary | ICD-10-CM

## 2013-07-16 DIAGNOSIS — H2510 Age-related nuclear cataract, unspecified eye: Secondary | ICD-10-CM | POA: Diagnosis not present

## 2013-07-16 DIAGNOSIS — H43819 Vitreous degeneration, unspecified eye: Secondary | ICD-10-CM | POA: Diagnosis not present

## 2013-09-16 ENCOUNTER — Encounter: Payer: Self-pay | Admitting: Family Medicine

## 2013-09-16 ENCOUNTER — Ambulatory Visit (INDEPENDENT_AMBULATORY_CARE_PROVIDER_SITE_OTHER): Payer: 59 | Admitting: Family Medicine

## 2013-09-16 VITALS — BP 124/79 | HR 57 | Temp 97.5°F | Resp 16 | Ht 69.0 in | Wt 178.0 lb

## 2013-09-16 DIAGNOSIS — N400 Enlarged prostate without lower urinary tract symptoms: Secondary | ICD-10-CM

## 2013-09-16 DIAGNOSIS — R7401 Elevation of levels of liver transaminase levels: Secondary | ICD-10-CM

## 2013-09-16 DIAGNOSIS — E782 Mixed hyperlipidemia: Secondary | ICD-10-CM

## 2013-09-16 DIAGNOSIS — G8929 Other chronic pain: Secondary | ICD-10-CM

## 2013-09-16 DIAGNOSIS — I2510 Atherosclerotic heart disease of native coronary artery without angina pectoris: Secondary | ICD-10-CM

## 2013-09-16 DIAGNOSIS — K760 Fatty (change of) liver, not elsewhere classified: Secondary | ICD-10-CM

## 2013-09-16 DIAGNOSIS — R740 Nonspecific elevation of levels of transaminase and lactic acid dehydrogenase [LDH]: Secondary | ICD-10-CM

## 2013-09-16 DIAGNOSIS — K7689 Other specified diseases of liver: Secondary | ICD-10-CM

## 2013-09-16 DIAGNOSIS — R7402 Elevation of levels of lactic acid dehydrogenase (LDH): Secondary | ICD-10-CM

## 2013-09-16 DIAGNOSIS — M545 Low back pain, unspecified: Secondary | ICD-10-CM

## 2013-09-16 LAB — ALT: ALT: 62 U/L — ABNORMAL HIGH (ref 0–53)

## 2013-09-16 LAB — LIPID PANEL
CHOLESTEROL: 152 mg/dL (ref 0–200)
HDL: 38.1 mg/dL — ABNORMAL LOW (ref >39.00)
LDL Cholesterol: 56 mg/dL (ref 0–99)
Total CHOL/HDL Ratio: 4
Triglycerides: 288 mg/dL — ABNORMAL HIGH (ref 0.0–149.0)
VLDL: 57.6 mg/dL — ABNORMAL HIGH (ref 0.0–40.0)

## 2013-09-16 LAB — AST: AST: 57 U/L — ABNORMAL HIGH (ref 0–37)

## 2013-09-16 NOTE — Progress Notes (Signed)
Pre visit review using our clinic review tool, if applicable. No additional management support is needed unless otherwise documented below in the visit note. 

## 2013-09-16 NOTE — Progress Notes (Signed)
OFFICE NOTE  09/16/2013  CC:  Chief Complaint  Patient presents with  . Follow-up    fasting     HPI: Patient is a 73 y.o. Caucasian male who is here for 6 mo f/u hyperlipidemia, CAD, BPH, chronic LBP. Remains active/walking. Amitriptyline was causing urinary retention/incomplete emptying so he weened himself off of it slowly. He has been able to cut back to one flomax at night and says his back pain is "ok" and urine problems MUCH better.  He is trying to eat a prudent diet.  No CP, no SOB.  No palpitations.  No dizziness.  Pertinent PMH:  Past medical, surgical, social, and family history reviewed and no changes are noted since last office visit.  MEDS:  Outpatient Prescriptions Prior to Visit  Medication Sig Dispense Refill  . amitriptyline (ELAVIL) 25 MG tablet TAKE 1 TABLET BY MOUTH 2 TIMES A DAY FOR NEUROPATHIC PAIN  180 tablet  1  . aspirin 325 MG tablet Take 325 mg by mouth daily.        Marland Kitchen atorvastatin (LIPITOR) 40 MG tablet Take 1 tablet (40 mg total) by mouth daily.  30 tablet  6  . b complex vitamins tablet Take 1 tablet by mouth daily.        . CELEBREX 200 MG capsule TAKE 1 CAPSULE (200 MG TOTAL) BY MOUTH TWICE DAILY AS NEEDED.  60 capsule  3  . Coenzyme Q10 (COQ10) 100 MG CAPS Take 1 capsule by mouth as directed.      . cyclobenzaprine (FLEXERIL) 5 MG tablet 1-2 tabs every 8 hours as needed for muscle spasms  30 tablet  1  . KRILL OIL PO Take 1,500 mg by mouth daily.      . Multiple Vitamin (MULTIVITAMIN) tablet Take 1 tablet by mouth daily.        Marland Kitchen NIACIN CR PO Take 400 mg by mouth daily.      . tamsulosin (FLOMAX) 0.4 MG CAPS capsule 1-2 tabs po qhs prn  180 capsule  1  . NITROSTAT 0.4 MG SL tablet PLACE 1 TABLET UNDER THE TONGUE EVERY 5 MINUTES AS NEEDED FOR CHEST PAIN.  30 tablet  2   No facility-administered medications prior to visit.    PE: Blood pressure 124/79, pulse 57, temperature 97.5 F (36.4 C), temperature source Temporal, resp. rate 16,  height 5' 9"  (1.753 m), weight 178 lb (80.74 kg), SpO2 96.00%. Gen: Alert, well appearing.  Patient is oriented to person, place, time, and situation. CV: RRR, no m/r/g.   LUNGS: CTA bilat, nonlabored resps, good aeration in all lung fields. EXT: no clubbing, cyanosis, or edema.    IMPRESSION AND PLAN:  1) Hyperlipidemia, mixed: recheck FLP, AST/ALT today.  2) CAD: asymptomatic/stable.  Continue ASA qd.  3) BPH: stable on 0.73m flomax qhs, also better with cessation of amitriptyline.  4) Chronic LBP: doing ok off amitriptyline.  FOLLOW UP: [address] recheck chronic illness

## 2013-10-23 ENCOUNTER — Other Ambulatory Visit: Payer: Self-pay | Admitting: Family Medicine

## 2013-10-26 NOTE — Telephone Encounter (Signed)
celebrex ok for RF as prev rx'd, 6 additional RF's.-thx

## 2013-10-26 NOTE — Telephone Encounter (Signed)
Last OV was 09/16/13.  Last RF was in 2013.  Please advise rf of celebrex.

## 2014-01-18 ENCOUNTER — Other Ambulatory Visit: Payer: Self-pay | Admitting: Family Medicine

## 2014-02-12 ENCOUNTER — Ambulatory Visit (INDEPENDENT_AMBULATORY_CARE_PROVIDER_SITE_OTHER): Payer: 59

## 2014-02-12 DIAGNOSIS — Z23 Encounter for immunization: Secondary | ICD-10-CM

## 2014-02-15 ENCOUNTER — Ambulatory Visit (INDEPENDENT_AMBULATORY_CARE_PROVIDER_SITE_OTHER): Payer: 59 | Admitting: Cardiology

## 2014-02-15 ENCOUNTER — Encounter: Payer: Self-pay | Admitting: Cardiology

## 2014-02-15 VITALS — BP 118/80 | HR 80 | Ht 69.0 in | Wt 181.2 lb

## 2014-02-15 DIAGNOSIS — I10 Essential (primary) hypertension: Secondary | ICD-10-CM

## 2014-02-15 DIAGNOSIS — I2510 Atherosclerotic heart disease of native coronary artery without angina pectoris: Secondary | ICD-10-CM | POA: Diagnosis not present

## 2014-02-15 NOTE — Progress Notes (Signed)
HPI The patient returns for follow up of CAD.  Since I last saw him he has done well.  He continues to be very active and is now working a very active ob.  He walks 4 miles per day and many stairs.  He does yard work. With this he denies any cardiovascular symptoms. The patient denies any new symptoms such as chest discomfort, neck or arm discomfort. There has been no new shortness of breath, PND or orthopnea. There have been no reported palpitations, presyncope or syncope.  No Known Allergies  Current Outpatient Prescriptions  Medication Sig Dispense Refill  . amitriptyline (ELAVIL) 25 MG tablet TAKE 1 TABLET BY MOUTH 2 TIMES A DAY FOR NEUROPATHIC PAIN  180 tablet  1  . aspirin 325 MG tablet Take 325 mg by mouth daily.        Marland Kitchen atorvastatin (LIPITOR) 40 MG tablet TAKE 1 TABLET BY MOUTH DAILY.  90 tablet  1  . b complex vitamins tablet Take 1 tablet by mouth daily.        . CELEBREX 200 MG capsule TAKE 1 CAPSULE (200 MG TOTAL) BY MOUTH TWICE DAILY AS NEEDED.  60 capsule  3  . Coenzyme Q10 (COQ10) 100 MG CAPS Take 1 capsule by mouth as directed.      . cyclobenzaprine (FLEXERIL) 5 MG tablet 1-2 tabs every 8 hours as needed for muscle spasms  30 tablet  1  . KRILL OIL PO Take 1,500 mg by mouth daily.      . Multiple Vitamin (MULTIVITAMIN) tablet Take 1 tablet by mouth daily.        Marland Kitchen NIACIN CR PO Take 400 mg by mouth daily.      Marland Kitchen NITROSTAT 0.4 MG SL tablet PLACE 1 TABLET UNDER THE TONGUE EVERY 5 MINUTES AS NEEDED FOR CHEST PAIN.  30 tablet  2  . tamsulosin (FLOMAX) 0.4 MG CAPS capsule TAKE 1 TO 2 CAPSULES BY MOUTH EACH DAY AT BEDTIME AS NEEDED  180 capsule  1   No current facility-administered medications for this visit.    Past Medical History  Diagnosis Date  . Coronary artery disease 12/22/07    chest pain, cardiac cath demontrated 95% stenosis in the proximal portion of the LAD.  There was a praximal stent with 10-20% restenosis.  The mid LAD had 70% stenosis.  The diagonal had 80-90%  stenosis.  The circumfles had luminal irregularities.  Right coronary was free of disease.  He had an EF of 65%.  He subsequently had drug-eluting stent in the proximal mid LAD.   ETT 01/30/11 was NEGATIVE.  Marland Kitchen Hyperlipidemia   . 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  . Colon polyps 07/2011    hyperplastic 07/25/2011  . Hearing loss 2004    Dr. Tamala Julian, ENT  . 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 Arthritis   . Diverticulosis of sigmoid colon 07/25/11    Severe (endoscopy by Dr. Sharlett Iles)  . Gallbladder polyp 2013    Noted 06/2011.  Stable on u/s f/u 10/2011.  Marland Kitchen Fatty liver disease, nonalcoholic 08/9447    mild transaminasemia (Hep B and C testing neg)  . BPH (benign prostatic hyperplasia)     Past Surgical History  Procedure Laterality Date  . Back surgery      C-Spine and L-Spine  . Lobectomy  right upper lobectomy  . Colonoscopy  02/26/02; 07/2011    Colonoscopy by Dr. Rowe Pavy 2003 was normal (+hx of polyps (adenomatous?) prior), 07/2011 showed one hyperplastic polyp.  . Tonsillectomy and adenoidectomy  age 61  . Cardiac catheterization    . Appendectomy  1953  . Coronary stent placement      x 2     ROS:  Decreased hearing, back pain.  Otherwise as stated in the HPI and negative for all other systems.  PHYSICAL EXAM BP 118/80  Pulse 80  Ht 5' 9"  (1.753 m)  Wt 181 lb 3.2 oz (82.192 kg)  BMI 26.75 kg/m2 GENERAL:  Well appearing NECK:  No jugular venous distention, waveform within normal limits, carotid upstroke brisk and symmetric, no bruits, no thyromegaly LUNGS:  Clear to auscultation bilaterally CHEST:  Thoracotomy scar    HEART:  PMI not displaced or sustained,S1 and S2 within normal limits, no S3, no S4, no clicks, no rubs, no murmurs ABD:  Flat, positive bowel sounds normal in frequency in pitch, no bruits, no rebound, no guarding, no midline  pulsatile mass, no hepatomegaly, no splenomegaly EXT:  2 plus pulses throughout, no edema, no cyanosis no clubbing   EKG:  Sinus rhythm, rate 80, axis within normal limits, intervals within normal limits, no acute ST-T wave changes, minimal LVH by voltage criteria. 02/15/2014  ASSESSMEN the  AND PLAN  CAD - The patient has no new sypmtoms.  No further cardiovascular testing is indicated.  We will continue with aggressive risk reduction and meds as listed.Marland Kitchen He had a negative stress test 2012.   DYSLIPIDEMIA -   His  LDL was 56 earlier this year. No change in therapy is indicated.   HTN (hypertension) -  The blood pressure is at target. No change in medications is indicated. We will continue with therapeutic lifestyle changes (TLC).

## 2014-02-15 NOTE — Patient Instructions (Signed)
Your physician recommends that you schedule a follow-up appointment in: one year with Dr. Percival Spanish

## 2014-03-18 ENCOUNTER — Ambulatory Visit: Payer: 59 | Admitting: Family Medicine

## 2014-03-19 ENCOUNTER — Encounter: Payer: Self-pay | Admitting: Family Medicine

## 2014-03-19 ENCOUNTER — Ambulatory Visit (INDEPENDENT_AMBULATORY_CARE_PROVIDER_SITE_OTHER): Payer: 59 | Admitting: Family Medicine

## 2014-03-19 VITALS — BP 121/82 | HR 58 | Temp 98.1°F | Resp 18 | Ht 69.0 in | Wt 180.0 lb

## 2014-03-19 DIAGNOSIS — Z23 Encounter for immunization: Secondary | ICD-10-CM

## 2014-03-19 DIAGNOSIS — I10 Essential (primary) hypertension: Secondary | ICD-10-CM

## 2014-03-19 DIAGNOSIS — G4700 Insomnia, unspecified: Secondary | ICD-10-CM | POA: Insufficient documentation

## 2014-03-19 DIAGNOSIS — R740 Nonspecific elevation of levels of transaminase and lactic acid dehydrogenase [LDH]: Secondary | ICD-10-CM

## 2014-03-19 DIAGNOSIS — R7401 Elevation of levels of liver transaminase levels: Secondary | ICD-10-CM

## 2014-03-19 DIAGNOSIS — I2510 Atherosclerotic heart disease of native coronary artery without angina pectoris: Secondary | ICD-10-CM

## 2014-03-19 DIAGNOSIS — Z0000 Encounter for general adult medical examination without abnormal findings: Secondary | ICD-10-CM | POA: Insufficient documentation

## 2014-03-19 DIAGNOSIS — M545 Low back pain, unspecified: Secondary | ICD-10-CM

## 2014-03-19 DIAGNOSIS — N400 Enlarged prostate without lower urinary tract symptoms: Secondary | ICD-10-CM

## 2014-03-19 DIAGNOSIS — E785 Hyperlipidemia, unspecified: Secondary | ICD-10-CM

## 2014-03-19 DIAGNOSIS — G8929 Other chronic pain: Secondary | ICD-10-CM

## 2014-03-19 LAB — COMPREHENSIVE METABOLIC PANEL
ALT: 43 U/L (ref 0–53)
AST: 35 U/L (ref 0–37)
Albumin: 4.2 g/dL (ref 3.5–5.2)
Alkaline Phosphatase: 80 U/L (ref 39–117)
BUN: 11 mg/dL (ref 6–23)
CHLORIDE: 102 meq/L (ref 96–112)
CO2: 28 meq/L (ref 19–32)
Calcium: 9.9 mg/dL (ref 8.4–10.5)
Creatinine, Ser: 1 mg/dL (ref 0.4–1.5)
GFR: 75.38 mL/min (ref >60.00)
Glucose, Bld: 96 mg/dL (ref 70–99)
POTASSIUM: 5.2 meq/L — AB (ref 3.5–5.1)
SODIUM: 137 meq/L (ref 135–145)
TOTAL PROTEIN: 7.5 g/dL (ref 6.0–8.3)
Total Bilirubin: 1.1 mg/dL (ref 0.2–1.2)

## 2014-03-19 NOTE — Assessment & Plan Note (Signed)
Last lipids fine. Repeat these in 6 mo at next f/u. Continue statin.

## 2014-03-19 NOTE — Progress Notes (Signed)
OFFICE VISIT  03/19/2014   CC:  Chief Complaint  Patient presents with  . Follow-up    fasting   HPI:    Patient is a 73 y.o. Caucasian male who presents for 6 mo f/u CAD, hyperlipidemia, BPH, chronic LBP. Says he went back to work full time.   No CP or SOB.  Saw Dr. Percival Spanish last mo and all was well, no w/u or changes.   One flomax in am and one on pm and says this works well for his bph. Says he has had no problem at all with back pain lately. Says he takes amitriptyline hs for sleep every night and this is helpful.  Past Medical History  Diagnosis Date  . Coronary artery disease 12/22/07    chest pain, cardiac cath demontrated 95% stenosis in the proximal portion of the LAD.  There was a praximal stent with 10-20% restenosis.  The mid LAD had 70% stenosis.  The diagonal had 80-90% stenosis.  The circumfles had luminal irregularities.  Right coronary was free of disease.  He had an EF of 65%.  He subsequently had drug-eluting stent in the proximal mid LAD.   ETT 01/30/11 was NEGATIVE.  Marland Kitchen Hyperlipidemia   . 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  . Colon polyps 07/2011    hyperplastic 07/25/2011  . Hearing loss 2004    Dr. Tamala Julian, ENT  . 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 Arthritis   . Diverticulosis of sigmoid colon 07/25/11    Severe (endoscopy by Dr. Sharlett Iles)  . Gallbladder polyp 2013    Noted 06/2011.  Stable on u/s f/u 10/2011.  Marland Kitchen Fatty liver disease, nonalcoholic 0/7371    mild transaminasemia (Hep B and C testing neg)  . BPH (benign prostatic hyperplasia)     Past Surgical History  Procedure Laterality Date  . Back surgery      C-Spine and L-Spine  . Lobectomy      right upper lobectomy  . Colonoscopy  02/26/02; 07/2011    Colonoscopy by Dr. Rowe Pavy 2003 was normal (+hx of polyps (adenomatous?) prior), 07/2011 showed one hyperplastic polyp.  .  Tonsillectomy and adenoidectomy  age 15  . Cardiac catheterization    . Appendectomy  1953  . Coronary stent placement      x 2   . Cardiovascular stress test  2012    Normal    Outpatient Prescriptions Prior to Visit  Medication Sig Dispense Refill  . amitriptyline (ELAVIL) 25 MG tablet TAKE 1 TABLET BY MOUTH 2 TIMES A DAY FOR NEUROPATHIC PAIN 180 tablet 1  . aspirin 325 MG tablet Take 325 mg by mouth daily.      Marland Kitchen atorvastatin (LIPITOR) 40 MG tablet TAKE 1 TABLET BY MOUTH DAILY. 90 tablet 1  . b complex vitamins tablet Take 1 tablet by mouth daily.      . CELEBREX 200 MG capsule TAKE 1 CAPSULE (200 MG TOTAL) BY MOUTH TWICE DAILY AS NEEDED. 60 capsule 3  . Coenzyme Q10 (COQ10) 100 MG CAPS Take 1 capsule by mouth as directed.    Marland Kitchen KRILL OIL PO Take 1,500 mg by mouth daily.    . Multiple Vitamin (MULTIVITAMIN) tablet Take 1 tablet by mouth daily.      Marland Kitchen NIACIN CR PO Take 400 mg by mouth daily.    . tamsulosin (FLOMAX) 0.4 MG  CAPS capsule TAKE 1 TO 2 CAPSULES BY MOUTH EACH DAY AT BEDTIME AS NEEDED 180 capsule 1  . cyclobenzaprine (FLEXERIL) 5 MG tablet 1-2 tabs every 8 hours as needed for muscle spasms 30 tablet 1  . NITROSTAT 0.4 MG SL tablet PLACE 1 TABLET UNDER THE TONGUE EVERY 5 MINUTES AS NEEDED FOR CHEST PAIN. 30 tablet 2   No facility-administered medications prior to visit.    No Known Allergies  ROS As per HPI  PE: Blood pressure 121/82, pulse 58, temperature 98.1 F (36.7 C), temperature source Temporal, resp. rate 18, height 5' 9"  (1.753 m), weight 180 lb (81.647 kg), SpO2 97 %. Gen: Alert, well appearing.  Patient is oriented to person, place, time, and situation. AFFECT: pleasant, lucid thought and speech. JQB:HALP: no injection, icteris, swelling, or exudate.  EOMI, PERRLA. Mouth: lips without lesion/swelling.  Oral mucosa pink and moist. Oropharynx without erythema, exudate, or swelling.  CV: RRR, no m/r/g.   LUNGS: CTA bilat, nonlabored resps, good aeration in all  lung fields. EXT: no clubbing, cyanosis, or edema.   LABS:  None today Lab Results  Component Value Date   CHOL 152 09/16/2013   HDL 38.10* 09/16/2013   LDLCALC 56 09/16/2013   LDLDIRECT 46.1 05/20/2013   TRIG 288.0* 09/16/2013   CHOLHDL 4 09/16/2013     Chemistry      Component Value Date/Time   NA 136 03/18/2013 0906   K 4.4 03/18/2013 0906   CL 99 03/18/2013 0906   CO2 26 03/18/2013 0906   BUN 15 03/18/2013 0906   CREATININE 0.93 03/18/2013 0906   CREATININE 1.0 10/15/2011 0859      Component Value Date/Time   CALCIUM 9.6 03/18/2013 0906   ALKPHOS 75 05/20/2013 0806   AST 57* 09/16/2013 0828   ALT 62* 09/16/2013 0828   BILITOT 1.0 05/20/2013 0806     Lab Results  Component Value Date   WBC 5.0 03/18/2013   HGB 15.6 03/18/2013   HCT 44.3 03/18/2013   MCV 93.5 03/18/2013   PLT 203 03/18/2013   Lab Results  Component Value Date   TSH 3.192 03/18/2013   Lab Results  Component Value Date   PSA 3.03 03/18/2013   PSA 2.34 02/13/2011   IMPRESSION AND PLAN:  CAD (coronary artery disease) The current medical regimen is effective;  continue present plan and medications. Asymptomatic.  Recent f/u with cardiologist was good.  Benign prostatic hypertrophy Sx's well controlled on flomax bid dosing.  Continue with this. At next f/u we'll do DRE and PSA for prostate cancer screening.  Chronic low back pain Doing well lately with this: no pain.  HTN (hypertension) BP well controlled on no meds at this time.  Transaminasemia F/u AST/ALT today.  Preventative health care UTD on flu vaccine today. Prevnar 13 IM today.  Insomnia Amitriptyline hs working well.  Hyperlipidemia Last lipids fine. Repeat these in 6 mo at next f/u. Continue statin.  An After Visit Summary was printed and given to the patient.  FOLLOW UP: Return in about 6 months (around 09/17/2014) for routine chronic illness f/u +DRE/PSA,.

## 2014-03-19 NOTE — Assessment & Plan Note (Signed)
The current medical regimen is effective;  continue present plan and medications. Asymptomatic.  Recent f/u with cardiologist was good.

## 2014-03-19 NOTE — Assessment & Plan Note (Signed)
Doing well lately with this: no pain.

## 2014-03-19 NOTE — Assessment & Plan Note (Signed)
UTD on flu vaccine today. Prevnar 13 IM today.

## 2014-03-19 NOTE — Assessment & Plan Note (Signed)
BP well controlled on no meds at this time.

## 2014-03-19 NOTE — Assessment & Plan Note (Signed)
F/u AST/ALT today.

## 2014-03-19 NOTE — Progress Notes (Signed)
Pre visit review using our clinic review tool, if applicable. No additional management support is needed unless otherwise documented below in the visit note. 

## 2014-03-19 NOTE — Assessment & Plan Note (Signed)
Amitriptyline hs working well.

## 2014-03-19 NOTE — Assessment & Plan Note (Signed)
Sx's well controlled on flomax bid dosing.  Continue with this. At next f/u we'll do DRE and PSA for prostate cancer screening.

## 2014-05-17 ENCOUNTER — Other Ambulatory Visit: Payer: Self-pay | Admitting: Family Medicine

## 2014-06-18 ENCOUNTER — Encounter: Payer: Self-pay | Admitting: Cardiology

## 2014-06-21 ENCOUNTER — Ambulatory Visit (INDEPENDENT_AMBULATORY_CARE_PROVIDER_SITE_OTHER): Payer: 59 | Admitting: Family Medicine

## 2014-06-21 ENCOUNTER — Encounter: Payer: Self-pay | Admitting: Family Medicine

## 2014-06-21 VITALS — BP 116/70 | HR 82 | Temp 97.7°F | Resp 18

## 2014-06-21 DIAGNOSIS — K219 Gastro-esophageal reflux disease without esophagitis: Secondary | ICD-10-CM

## 2014-06-21 DIAGNOSIS — J029 Acute pharyngitis, unspecified: Secondary | ICD-10-CM

## 2014-06-21 DIAGNOSIS — J387 Other diseases of larynx: Secondary | ICD-10-CM

## 2014-06-21 MED ORDER — NITROGLYCERIN 0.4 MG SL SUBL: SUBLINGUAL_TABLET | SUBLINGUAL | Status: DC

## 2014-06-21 MED ORDER — OMEPRAZOLE 40 MG PO CPDR: DELAYED_RELEASE_CAPSULE | ORAL | Status: DC

## 2014-06-21 NOTE — Progress Notes (Signed)
OFFICE NOTE  06/21/2014  CC:  Chief Complaint  Patient presents with  . Sore Throat   HPI: Patient is a 74 y.o. Caucasian male who is here for sore throat. Onset about 4 wks ago, hurts only on left side when he swallows.  No better and no worse since onset. Sometimes doesn't notice it as much in mornings but as the day goes on it starts to hurt more.  Intensity is 1-2/10--not bad at all, just has him wondering what is wrong.  When he eats/drinks and swallows it doesn't bother him, but when he swallows w/out any food or drink it hurts as described.  No URI, no PND, no cough.  Not much problem with acid reflux/regurgitation.  No fevers.  Appetite is good, food intake normal. No new meds coincidental with onset of symptom.  He has not tried any meds for treatment of the symptom.  Pertinent PMH:  Past medical, surgical, social, and family history reviewed and no changes are noted since last office visit.  MEDS:  Outpatient Prescriptions Prior to Visit  Medication Sig Dispense Refill  . amitriptyline (ELAVIL) 25 MG tablet TAKE 1 TABLET BY MOUTH 2 TIMES A DAY FOR NEUROPATHIC PAIN 180 tablet 1  . aspirin 325 MG tablet Take 325 mg by mouth daily.      Marland Kitchen atorvastatin (LIPITOR) 40 MG tablet TAKE 1 TABLET BY MOUTH DAILY. 90 tablet 1  . b complex vitamins tablet Take 1 tablet by mouth daily.      . CELEBREX 200 MG capsule TAKE 1 CAPSULE (200 MG TOTAL) BY MOUTH TWICE DAILY AS NEEDED. 60 capsule 3  . Coenzyme Q10 (COQ10) 100 MG CAPS Take 1 capsule by mouth as directed.    Marland Kitchen KRILL OIL PO Take 1,500 mg by mouth daily.    . Multiple Vitamin (MULTIVITAMIN) tablet Take 1 tablet by mouth daily.      Marland Kitchen NIACIN CR PO Take 400 mg by mouth daily.    . tamsulosin (FLOMAX) 0.4 MG CAPS capsule TAKE 1 TO 2 CAPSULES BY MOUTH EACH DAY AT BEDTIME AS NEEDED 180 capsule 1  . NITROSTAT 0.4 MG SL tablet PLACE 1 TABLET UNDER THE TONGUE EVERY 5 MINUTES AS NEEDED FOR CHEST PAIN. 30 tablet 2  . cyclobenzaprine (FLEXERIL) 5  MG tablet 1-2 tabs every 8 hours as needed for muscle spasms 30 tablet 1   No facility-administered medications prior to visit.  Not taking flexeril listed above.  Rarely takes celebrex listed above  PE: Blood pressure 116/70, pulse 82, temperature 97.7 F (36.5 C), temperature source Temporal, resp. rate 18. Gen: Alert, well appearing.  Patient is oriented to person, place, time, and situation. ENT: Ears: EACs clear, normal epithelium.  TMs with good light reflex and landmarks bilaterally.  Eyes: no injection, icteris, swelling, or exudate.  EOMI, PERRLA. Nose: no drainage or turbinate edema/swelling.  No injection or focal lesion.  Mouth: lips without lesion/swelling.  Oral mucosa pink and moist.  Dentition intact and without obvious caries or gingival swelling.  Oropharynx without erythema, exudate, or swelling.  Neck - No masses or thyromegaly or limitation in range of motion CV: RRR, no m/r/g.   LUNGS: CTA bilat, nonlabored resps, good aeration in all lung fields.   IMPRESSION AND PLAN:  Mild ST feeling on left side of throat x 1 mo, unknown etiology.  Normal/reassuring exam today. Will treat x 1 mo for "silent" LPR with omeprazole 43m qd. If not improved with 1 mo of this then will  refer to ENT for consideration of direct visualization deeper in pharynx where I am unable to see.  Spent 25 min with pt today, with >50% of this time spent in counseling and care coordination regarding the above problems.  An After Visit Summary was printed and given to the patient.  FOLLOW UP: prn--he'll call or e-mail me in 1 mo with report of how he's doing.  He understands that he may make o/v for f/u if he wishes.

## 2014-07-06 ENCOUNTER — Other Ambulatory Visit: Payer: Self-pay | Admitting: Family Medicine

## 2014-07-26 ENCOUNTER — Ambulatory Visit (INDEPENDENT_AMBULATORY_CARE_PROVIDER_SITE_OTHER): Payer: 59 | Admitting: Ophthalmology

## 2014-07-26 DIAGNOSIS — H43813 Vitreous degeneration, bilateral: Secondary | ICD-10-CM

## 2014-07-26 DIAGNOSIS — H3531 Nonexudative age-related macular degeneration: Secondary | ICD-10-CM

## 2014-09-16 ENCOUNTER — Encounter: Payer: Self-pay | Admitting: Family Medicine

## 2014-09-16 ENCOUNTER — Ambulatory Visit (INDEPENDENT_AMBULATORY_CARE_PROVIDER_SITE_OTHER): Payer: 59 | Admitting: Family Medicine

## 2014-09-16 VITALS — BP 118/78 | HR 60 | Temp 97.7°F | Resp 16 | Wt 181.0 lb

## 2014-09-16 DIAGNOSIS — M545 Low back pain, unspecified: Secondary | ICD-10-CM

## 2014-09-16 DIAGNOSIS — E785 Hyperlipidemia, unspecified: Secondary | ICD-10-CM | POA: Diagnosis not present

## 2014-09-16 DIAGNOSIS — Z125 Encounter for screening for malignant neoplasm of prostate: Secondary | ICD-10-CM

## 2014-09-16 DIAGNOSIS — N401 Enlarged prostate with lower urinary tract symptoms: Secondary | ICD-10-CM | POA: Diagnosis not present

## 2014-09-16 DIAGNOSIS — I2510 Atherosclerotic heart disease of native coronary artery without angina pectoris: Secondary | ICD-10-CM

## 2014-09-16 DIAGNOSIS — N138 Other obstructive and reflux uropathy: Secondary | ICD-10-CM

## 2014-09-16 LAB — LIPID PANEL
CHOL/HDL RATIO: 4
Cholesterol: 140 mg/dL (ref 0–200)
HDL: 37.5 mg/dL — ABNORMAL LOW (ref >39.00)
NonHDL: 102.5
Triglycerides: 235 mg/dL — ABNORMAL HIGH (ref 0.0–149.0)
VLDL: 47 mg/dL — AB (ref 0.0–40.0)

## 2014-09-16 LAB — BASIC METABOLIC PANEL
BUN: 13 mg/dL (ref 6–23)
CO2: 33 mEq/L — ABNORMAL HIGH (ref 19–32)
CREATININE: 1.08 mg/dL (ref 0.40–1.50)
Calcium: 10 mg/dL (ref 8.4–10.5)
Chloride: 101 mEq/L (ref 96–112)
GFR: 71.27 mL/min (ref >60.00)
Glucose, Bld: 98 mg/dL (ref 70–99)
POTASSIUM: 4.8 meq/L (ref 3.5–5.1)
SODIUM: 138 meq/L (ref 135–145)

## 2014-09-16 LAB — PSA: PSA: 4.03 ng/mL — AB (ref 0.10–4.00)

## 2014-09-16 LAB — LDL CHOLESTEROL, DIRECT: Direct LDL: 71 mg/dL

## 2014-09-16 NOTE — Progress Notes (Signed)
Pre visit review using our clinic review tool, if applicable. No additional management support is needed unless otherwise documented below in the visit note. 

## 2014-09-16 NOTE — Progress Notes (Signed)
OFFICE NOTE  09/16/2014  CC:  Chief Complaint  Patient presents with  . Follow-up    6 month follow up    HPI: Patient is a 74 y.o. Caucasian male who is here for 6 mo f/u CAD, hyperlipidemia, BPH, chronic LBP. Working still, walking a lot as well.  No CP or SOB. Doing good, no problems.  BPH: urinating well on flomax bid.  Hyperlipid: taking lipitor daily, no side effects.  Fasting today in prep for recheck of FLP.  LBP: doing fine, takes a celebrex once every 3 wks or so.  Amitriptyline helps with LB and with sleep. Back seems to be doing very well.  Pertinent PMH:  Past medical, surgical, social, and family history reviewed and no changes are noted since last office visit.  MEDS:  Outpatient Prescriptions Prior to Visit  Medication Sig Dispense Refill  . amitriptyline (ELAVIL) 25 MG tablet TAKE 1 TABLET BY MOUTH 2 TIMES A DAY FOR NEUROPATHIC PAIN 180 tablet 1  . amitriptyline (ELAVIL) 25 MG tablet TAKE 1 TABLET BY MOUTH 2 TIMES A DAY FOR NEUROPATHIC PAIN 180 tablet 1  . aspirin 325 MG tablet Take 325 mg by mouth daily.      Marland Kitchen atorvastatin (LIPITOR) 40 MG tablet TAKE 1 TABLET BY MOUTH DAILY. 90 tablet 1  . b complex vitamins tablet Take 1 tablet by mouth daily.      . CELEBREX 200 MG capsule TAKE 1 CAPSULE (200 MG TOTAL) BY MOUTH TWICE DAILY AS NEEDED. 60 capsule 3  . Coenzyme Q10 (COQ10) 100 MG CAPS Take 1 capsule by mouth as directed.    Marland Kitchen KRILL OIL PO Take 1,500 mg by mouth daily.    . Multiple Vitamin (MULTIVITAMIN) tablet Take 1 tablet by mouth daily.      Marland Kitchen NIACIN CR PO Take 400 mg by mouth daily.    . nitroGLYCERIN (NITROSTAT) 0.4 MG SL tablet PLACE 1 TABLET UNDER THE TONGUE EVERY 5 MINUTES AS NEEDED FOR CHEST PAIN. 15 tablet 2  . omeprazole (PRILOSEC) 40 MG capsule 1 tab po qAM x 1 month 30 capsule 0  . tamsulosin (FLOMAX) 0.4 MG CAPS capsule TAKE 1 TO 2 CAPSULES BY MOUTH EACH DAY AT BEDTIME AS NEEDED 180 capsule 1   No facility-administered medications prior to  visit.    PE: Blood pressure 118/78, pulse 60, temperature 97.7 F (36.5 C), temperature source Oral, resp. rate 16, weight 181 lb (82.101 kg), SpO2 96 %. Gen: Alert, well appearing.  Patient is oriented to person, place, time, and situation. Rectal exam: negative without mass, lesions or tenderness, PROSTATE EXAM: smooth and symmetric without nodules or tenderness.  LAB: Lab Results  Component Value Date   CHOL 152 09/16/2013   HDL 38.10* 09/16/2013   LDLCALC 56 09/16/2013   LDLDIRECT 46.1 05/20/2013   TRIG 288.0* 09/16/2013   CHOLHDL 4 09/16/2013     Chemistry      Component Value Date/Time   NA 137 03/19/2014 1006   K 5.2* 03/19/2014 1006   CL 102 03/19/2014 1006   CO2 28 03/19/2014 1006   BUN 11 03/19/2014 1006   CREATININE 1.0 03/19/2014 1006   CREATININE 0.93 03/18/2013 0906      Component Value Date/Time   CALCIUM 9.9 03/19/2014 1006   ALKPHOS 80 03/19/2014 1006   AST 35 03/19/2014 1006   ALT 43 03/19/2014 1006   BILITOT 1.1 03/19/2014 1006     Lab Results  Component Value Date   PSA 3.03 03/18/2013  PSA 2.34 02/13/2011     IMPRESSION AND PLAN:  1) CAD; asymptomatic.  Continue ASA, statin, and approp routine cardiology f/u.  2) BPH: The current medical regimen is effective;  continue present plan and medications.  3) Prostate ca screening: DRE normal, PSA drawn today. We discussed the fact that we'll be d/c'ing this screening sometime in the next couple of years as long as he has normal screening.  4) LBP: doing very well, with rare need for NSAIDs.  5) Hyperlipidemia: recheck FLP today. AST/ALT normal 03/2014.  An After Visit Summary was printed and given to the patient.  FOLLOW UP: 6 mo

## 2014-09-23 ENCOUNTER — Other Ambulatory Visit: Payer: Self-pay | Admitting: Family Medicine

## 2014-09-23 DIAGNOSIS — R972 Elevated prostate specific antigen [PSA]: Secondary | ICD-10-CM

## 2014-10-25 ENCOUNTER — Other Ambulatory Visit: Payer: Self-pay | Admitting: Family Medicine

## 2014-11-09 ENCOUNTER — Other Ambulatory Visit: Payer: Self-pay | Admitting: Family Medicine

## 2014-11-09 NOTE — Telephone Encounter (Signed)
RF request for tamsulosin.  LOV: 5/12/216 Next ov: 03/15/15 Last written: 05/17/14 #180 w/ 1RF

## 2014-12-06 DIAGNOSIS — Z8546 Personal history of malignant neoplasm of prostate: Secondary | ICD-10-CM

## 2014-12-06 HISTORY — DX: Personal history of malignant neoplasm of prostate: Z85.46

## 2014-12-24 ENCOUNTER — Other Ambulatory Visit: Payer: 59

## 2014-12-24 DIAGNOSIS — R972 Elevated prostate specific antigen [PSA]: Secondary | ICD-10-CM

## 2014-12-25 LAB — PSA, TOTAL AND FREE
PSA FREE PCT: 14 % — AB (ref >25)
PSA, Free: 0.81 ng/mL
PSA: 5.69 ng/mL — ABNORMAL HIGH (ref <=4.00)

## 2014-12-27 ENCOUNTER — Encounter: Payer: Self-pay | Admitting: Family Medicine

## 2014-12-28 ENCOUNTER — Other Ambulatory Visit: Payer: Self-pay | Admitting: Family Medicine

## 2014-12-28 ENCOUNTER — Encounter: Payer: Self-pay | Admitting: Family Medicine

## 2014-12-28 DIAGNOSIS — R972 Elevated prostate specific antigen [PSA]: Secondary | ICD-10-CM

## 2015-03-02 DIAGNOSIS — R972 Elevated prostate specific antigen [PSA]: Secondary | ICD-10-CM | POA: Diagnosis not present

## 2015-03-02 DIAGNOSIS — R350 Frequency of micturition: Secondary | ICD-10-CM | POA: Diagnosis not present

## 2015-03-02 DIAGNOSIS — N401 Enlarged prostate with lower urinary tract symptoms: Secondary | ICD-10-CM | POA: Diagnosis not present

## 2015-03-03 ENCOUNTER — Encounter: Payer: Self-pay | Admitting: Family Medicine

## 2015-03-04 ENCOUNTER — Ambulatory Visit (INDEPENDENT_AMBULATORY_CARE_PROVIDER_SITE_OTHER): Payer: 59 | Admitting: Cardiology

## 2015-03-04 ENCOUNTER — Encounter: Payer: Self-pay | Admitting: Cardiology

## 2015-03-04 VITALS — BP 140/90 | HR 57 | Ht 69.0 in | Wt 180.0 lb

## 2015-03-04 DIAGNOSIS — Z79899 Other long term (current) drug therapy: Secondary | ICD-10-CM

## 2015-03-04 DIAGNOSIS — E785 Hyperlipidemia, unspecified: Secondary | ICD-10-CM | POA: Diagnosis not present

## 2015-03-04 DIAGNOSIS — I2510 Atherosclerotic heart disease of native coronary artery without angina pectoris: Secondary | ICD-10-CM | POA: Diagnosis not present

## 2015-03-04 MED ORDER — ROSUVASTATIN CALCIUM 20 MG PO TABS: 20.0000 mg | ORAL_TABLET | Freq: Every day | ORAL | Status: DC

## 2015-03-04 NOTE — Progress Notes (Signed)
HPI The patient returns for follow up of CAD.  Since I last saw him he has done well.  He continues to be very active and is working full time at [employer].  He walks 9 miles per day and many stairs.  He does yard work. With this he denies any cardiovascular symptoms. The patient denies any new symptoms such as chest discomfort, neck or arm discomfort. There has been no new shortness of breath, PND or orthopnea. There have been no reported palpitations, presyncope or syncope.     Of note he did have muscle aches while taking Lipitor as so he took himself off of this. He started himself on over-the-counter niacin.  No Known Allergies  Current Outpatient Prescriptions  Medication Sig Dispense Refill  . amitriptyline (ELAVIL) 25 MG tablet TAKE 1 TABLET BY MOUTH 2 TIMES A DAY FOR NEUROPATHIC PAIN 180 tablet 1  . amitriptyline (ELAVIL) 25 MG tablet TAKE 1 TABLET BY MOUTH 2 TIMES A DAY FOR NEUROPATHIC PAIN 180 tablet 1  . amitriptyline (ELAVIL) 25 MG tablet TAKE 1 TABLET BY MOUTH 2 TIMES A DAY FOR NEUROPATHIC PAIN 180 tablet 1  . aspirin 325 MG tablet Take 325 mg by mouth daily.      Marland Kitchen b complex vitamins tablet Take 1 tablet by mouth daily.      . CELEBREX 200 MG capsule TAKE 1 CAPSULE (200 MG TOTAL) BY MOUTH TWICE DAILY AS NEEDED. 60 capsule 3  . Coenzyme Q10 (COQ10) 100 MG CAPS Take 1 capsule by mouth as directed.    Marland Kitchen KRILL OIL PO Take 1,500 mg by mouth daily.    . Multiple Vitamin (MULTIVITAMIN) tablet Take 1 tablet by mouth daily.      Marland Kitchen NIACIN CR PO Take 2,000 mg by mouth daily.     . nitroGLYCERIN (NITROSTAT) 0.4 MG SL tablet PLACE 1 TABLET UNDER THE TONGUE EVERY 5 MINUTES AS NEEDED FOR CHEST PAIN. 15 tablet 2  . omeprazole (PRILOSEC) 40 MG capsule 1 tab po qAM x 1 month 30 capsule 0  . tamsulosin (FLOMAX) 0.4 MG CAPS capsule TAKE 1 TO 2 CAPSULES BY MOUTH EACH DAY AT BEDTIME AS NEEDED 180 capsule 1   No current facility-administered medications for this visit.    Past Medical History    Diagnosis Date  . Coronary artery disease 12/22/07    chest pain, cardiac cath demontrated 95% stenosis in the proximal portion of the LAD.  There was a praximal stent with 10-20% restenosis.  The mid LAD had 70% stenosis.  The diagonal had 80-90% stenosis.  The circumflex had luminal irregularities.  Right coronary was free of disease.  He had an EF of 65%.  He subsequently had drug-eluting stent in the proximal mid LAD.   ETT 01/30/11 was NEGATIVE.  Marland Kitchen Hyperlipidemia   . 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  . Colon polyps 07/2011    hyperplastic 07/25/2011  . Hearing loss 2004    Dr. Tamala Julian, ENT  . 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 Arthritis   . Diverticulosis of sigmoid colon 07/25/11    Severe (endoscopy by Dr. Sharlett Iles)  . Gallbladder polyp 2013    Noted 06/2011.  Stable on u/s f/u 10/2011.  Marland Kitchen Fatty liver disease, nonalcoholic 10/7012    mild transaminasemia (Hep B and C testing neg)  . BPH (benign prostatic hyperplasia)   .  Elevated PSA 12/2014    with low % free, making prostate ca risk high---referred to urology 12/28/14.  Urol felt nodule, bx planned as of 03/02/15.    Past Surgical History  Procedure Laterality Date  . Back surgery      C-Spine and L-Spine  . Lobectomy      right upper lobectomy  . Colonoscopy  02/26/02; 07/2011    Colonoscopy by Dr. Rowe Pavy 2003 was normal (+hx of polyps (adenomatous?) prior), 07/2011 showed one hyperplastic polyp.  . Tonsillectomy and adenoidectomy  age 78  . Cardiac catheterization    . Appendectomy  1953  . Coronary stent placement      x 2 (LAD)  . Cardiovascular stress test  2012    Normal    ROS:  Decreased hearing, back pain.  Otherwise as stated in the HPI and negative for all other systems.  PHYSICAL EXAM BP 140/90 mmHg  Pulse 57  Ht 5' 9"  (1.753 m)  Wt 180 lb (81.647 kg)  BMI 26.57 kg/m2 GENERAL:   Well appearing NECK:  No jugular venous distention, waveform within normal limits, carotid upstroke brisk and symmetric, no bruits, no thyromegaly LUNGS:  Clear to auscultation bilaterally CHEST:  Thoracotomy scar    HEART:  PMI not displaced or sustained,S1 and S2 within normal limits, no S3, no S4, no clicks, no rubs, no murmurs ABD:  Flat, positive bowel sounds normal in frequency in pitch, no bruits, no rebound, no guarding, no midline pulsatile mass, no hepatomegaly, no splenomegaly EXT:  2 plus pulses throughout, no edema, no cyanosis no clubbing   EKG:  Sinus rhythm, rate 57, axis within normal limits, intervals within normal limits, T-wave inversion in lead V3 which is nonspecific minimal LVH by voltage criteria. 03/04/2015  ASSESSMEN the  AND PLAN  CAD - The patient has no new sypmtoms.  No further cardiovascular testing is indicated.  We will continue with aggressive risk reduction and meds as listed. He had a negative stress test 2012.   He does have some nonspecific T-wave changes. I did give him a copy of his EKG.  DYSLIPIDEMIA -  We talked a long time without the benefit of statin and I suggested he try Crestor 20 mg. I would also suggest he come off of niacin but he doesn't want to do this. If he's going to use a combination we have to follow his liver enzymes very closely and he scheduled to have these drawn. He will also have scheduled follow-up of his lipids.  HTN (hypertension) -  The blood pressure is at target. No change in medications is indicated. We will continue with therapeutic lifestyle changes (TLC).

## 2015-03-04 NOTE — Patient Instructions (Signed)
Your physician wants you to follow-up in: 1 Year. You will receive a reminder letter in the mail two months in advance. If you don't receive a letter, please call our office to schedule the follow-up appointment.  Your physician has recommended you make the following change in your medication: START Crestor 20 mg daily and Decrease Aspirin 81 mg daily  Your physician recommends that you return for lab work in: 1 Month CMP and 8 Weeks Fasting lipids liver function

## 2015-03-08 DEATH — deceased

## 2015-03-25 ENCOUNTER — Encounter: Payer: Self-pay | Admitting: Family Medicine

## 2015-03-25 ENCOUNTER — Ambulatory Visit (INDEPENDENT_AMBULATORY_CARE_PROVIDER_SITE_OTHER): Payer: 59 | Admitting: Family Medicine

## 2015-03-25 VITALS — BP 142/86 | HR 66 | Temp 97.4°F | Resp 16 | Wt 179.0 lb

## 2015-03-25 DIAGNOSIS — I10 Essential (primary) hypertension: Secondary | ICD-10-CM | POA: Diagnosis not present

## 2015-03-25 DIAGNOSIS — Z23 Encounter for immunization: Secondary | ICD-10-CM

## 2015-03-25 DIAGNOSIS — I2510 Atherosclerotic heart disease of native coronary artery without angina pectoris: Secondary | ICD-10-CM | POA: Diagnosis not present

## 2015-03-25 DIAGNOSIS — R972 Elevated prostate specific antigen [PSA]: Secondary | ICD-10-CM

## 2015-03-25 DIAGNOSIS — E785 Hyperlipidemia, unspecified: Secondary | ICD-10-CM

## 2015-03-25 NOTE — Progress Notes (Signed)
OFFICE VISIT  03/25/2015   CC:  Chief Complaint  Patient presents with  . Follow-up   HPI:    Patient is a 74 y.o. Caucasian male who presents for 6 mo f/u HLD, CAD, recent hx of elevated PSA.Roney Jaffe he is feeling very well. He saw his cardiologist about 3 wks ago.  Since he had myalgias on atorvastatin he had put himself on OTC niacin. Cardiology put him on generic crestor 75m qd and he has not started taking it yet.  He will be stopping the niacin when he starts the crestor.  Prostate bx set for 04/27/15 for elevated PSA and prostate nodule.  No home bp monitoring.  BP at urologist's syst 150, recent cards visit syst 140s.  He usually takes kefir and says it is a natural bp reducer.  Has not been on it last few weeks, though, plans on getting back on it.  CAD: no CP/SOB/palpitations, lightheadedness, arm pain, jaw pain, or nausea.    ROS: no fevers, night sweats, or abnl wt loss.  Past Medical History  Diagnosis Date  . Coronary artery disease 12/22/07    chest pain, cardiac cath demontrated 95% stenosis in the proximal portion of the LAD.  There was a praximal stent with 10-20% restenosis.  The mid LAD had 70% stenosis.  The diagonal had 80-90% stenosis.  The circumflex had luminal irregularities.  Right coronary was free of disease.  He had an EF of 65%.  He subsequently had drug-eluting stent in the proximal mid LAD.   ETT 01/30/11 was NEGATIVE.  .Marland KitchenHyperlipidemia   . 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  . Colon polyps 07/2011    hyperplastic 07/25/2011  . Hearing loss 2004    Dr. STamala Julian ENT  . 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 Arthritis   . Diverticulosis of sigmoid colon 07/25/11    Severe (endoscopy by Dr. PSharlett Iles  . Gallbladder polyp 2013    Noted 06/2011.  Stable on u/s f/u 10/2011.  .Marland KitchenFatty liver disease, nonalcoholic 26/9678   mild  transaminasemia (Hep B and C testing neg)  . BPH (benign prostatic hyperplasia)   . Elevated PSA 12/2014    with low % free, making prostate ca risk high---referred to urology 12/28/14.  Urol felt nodule, bx planned as of 03/02/15.    Past Surgical History  Procedure Laterality Date  . Back surgery      C-Spine and L-Spine  . Lobectomy      right upper lobectomy  . Colonoscopy  02/26/02; 07/2011    Colonoscopy by Dr. ARowe Pavy2003 was normal (+hx of polyps (adenomatous?) prior), 07/2011 showed one hyperplastic polyp.  . Tonsillectomy and adenoidectomy  age 74 . Cardiac catheterization    . Appendectomy  1953  . Coronary stent placement      x 2 (LAD)  . Cardiovascular stress test  2012    Normal   Meds: ASA [id number] Outpatient Prescriptions Prior to Visit  Medication Sig Dispense Refill  . amitriptyline (ELAVIL) 25 MG tablet TAKE 1 TABLET BY MOUTH 2 TIMES A DAY FOR NEUROPATHIC PAIN 180 tablet 1  . b complex vitamins tablet Take 1 tablet by mouth daily.      . CELEBREX 200 MG capsule TAKE 1 CAPSULE (200 MG TOTAL) BY MOUTH TWICE DAILY AS NEEDED. 60 capsule 3  .  Coenzyme Q10 (COQ10) 100 MG CAPS Take 1 capsule by mouth as directed.    Marland Kitchen KRILL OIL PO Take 1,500 mg by mouth daily.    . Multiple Vitamin (MULTIVITAMIN) tablet Take 1 tablet by mouth daily.      Marland Kitchen NIACIN CR PO Take 2,000 mg by mouth daily.     . nitroGLYCERIN (NITROSTAT) 0.4 MG SL tablet PLACE 1 TABLET UNDER THE TONGUE EVERY 5 MINUTES AS NEEDED FOR CHEST PAIN. 15 tablet 2  . omeprazole (PRILOSEC) 40 MG capsule 1 tab po qAM x 1 month 30 capsule 0  . rosuvastatin (CRESTOR) 20 MG tablet Take 1 tablet (20 mg total) by mouth daily. 30 tablet 9  . tamsulosin (FLOMAX) 0.4 MG CAPS capsule TAKE 1 TO 2 CAPSULES BY MOUTH EACH DAY AT BEDTIME AS NEEDED 180 capsule 1  . amitriptyline (ELAVIL) 25 MG tablet TAKE 1 TABLET BY MOUTH 2 TIMES A DAY FOR NEUROPATHIC PAIN 180 tablet 1  . amitriptyline (ELAVIL) 25 MG tablet TAKE 1 TABLET BY MOUTH 2 TIMES  A DAY FOR NEUROPATHIC PAIN 180 tablet 1   No facility-administered medications prior to visit.    No Known Allergies  ROS As per HPI  PE: Blood pressure 142/86, pulse 66, temperature 97.4 F (36.3 C), temperature source Oral, resp. rate 16, weight 179 lb (81.194 kg), SpO2 97 %. Gen: Alert, well appearing.  Patient is oriented to person, place, time, and situation. CV: RRR, no m/r/g.   LUNGS: CTA bilat, nonlabored resps, good aeration in all lung fields. EXT: no clubbing, cyanosis, or edema.    LABS:  Lab Results  Component Value Date   CHOL 140 09/16/2014   HDL 37.50* 09/16/2014   LDLCALC 56 09/16/2013   LDLDIRECT 71.0 09/16/2014   TRIG 235.0* 09/16/2014   CHOLHDL 4 09/16/2014     Chemistry      Component Value Date/Time   NA 138 09/16/2014 0825   K 4.8 09/16/2014 0825   CL 101 09/16/2014 0825   CO2 33* 09/16/2014 0825   BUN 13 09/16/2014 0825   CREATININE 1.08 09/16/2014 0825   CREATININE 0.93 03/18/2013 0906      Component Value Date/Time   CALCIUM 10.0 09/16/2014 0825   ALKPHOS 80 03/19/2014 1006   AST 35 03/19/2014 1006   ALT 43 03/19/2014 1006   BILITOT 1.1 03/19/2014 1006       IMPRESSION AND PLAN:  1) HLD: crestor 71m --he is to start this anytime now and he'll stop niacin when he does this. Plan on recheck FLP and AST/ALT in 6 mo.  2) HTN: bp up a little lately but has historically been good on no antihypertensives.  He swears by a natural substance called kefir --he will restart this ASAP and his wife will monitor bp and he'll call if numbers not consistently <140/90.  3) Elevated PSA with prostate nodule: urology is following, set for prostate bx in about 1 mo.  4) CAD: asymptomatic.  Continue ASA, statin, monitor bp.  Keep cards f/u-routine.  An After Visit Summary was printed and given to the patient.   FOLLOW UP: Return in about 6 months (around 09/22/2015) for routine chronic illness f/u-fasting.

## 2015-04-15 DIAGNOSIS — Z872 Personal history of diseases of the skin and subcutaneous tissue: Secondary | ICD-10-CM | POA: Diagnosis not present

## 2015-04-15 DIAGNOSIS — D1800 Hemangioma unspecified site: Secondary | ICD-10-CM | POA: Diagnosis not present

## 2015-04-15 DIAGNOSIS — D225 Melanocytic nevi of trunk: Secondary | ICD-10-CM | POA: Diagnosis not present

## 2015-04-15 DIAGNOSIS — L821 Other seborrheic keratosis: Secondary | ICD-10-CM | POA: Diagnosis not present

## 2015-04-15 DIAGNOSIS — D485 Neoplasm of uncertain behavior of skin: Secondary | ICD-10-CM | POA: Diagnosis not present

## 2015-04-15 DIAGNOSIS — L570 Actinic keratosis: Secondary | ICD-10-CM | POA: Diagnosis not present

## 2015-04-15 DIAGNOSIS — L814 Other melanin hyperpigmentation: Secondary | ICD-10-CM | POA: Diagnosis not present

## 2015-04-27 DIAGNOSIS — R972 Elevated prostate specific antigen [PSA]: Secondary | ICD-10-CM | POA: Diagnosis not present

## 2015-04-27 DIAGNOSIS — C61 Malignant neoplasm of prostate: Secondary | ICD-10-CM | POA: Diagnosis not present

## 2015-04-27 HISTORY — PX: PROSTATE BIOPSY: SHX241

## 2015-05-05 ENCOUNTER — Encounter: Payer: Self-pay | Admitting: Family Medicine

## 2015-05-10 ENCOUNTER — Other Ambulatory Visit: Payer: Self-pay | Admitting: Family Medicine

## 2015-05-10 MED FILL — AMITRIPTYLINE HCL 25 MG TAB: 25 | 90 days supply | Qty: 180 | Fill #1

## 2015-05-11 MED FILL — TAMSULOSIN HCL 0.4 MG CAP: 0.4 | 90 days supply | Qty: 180 | Fill #0

## 2015-05-11 NOTE — Telephone Encounter (Signed)
RF request for tamsuloxin LOV: 03/25/15 Next ov: 09/23/15 Last written: 11/09/14 #180 w/ 1RF

## 2015-05-18 DIAGNOSIS — N401 Enlarged prostate with lower urinary tract symptoms: Secondary | ICD-10-CM | POA: Diagnosis not present

## 2015-05-18 DIAGNOSIS — C61 Malignant neoplasm of prostate: Secondary | ICD-10-CM | POA: Diagnosis not present

## 2015-05-19 ENCOUNTER — Encounter: Payer: Self-pay | Admitting: Family Medicine

## 2015-05-23 MED FILL — ROSUVASTATIN CALCIUM 20 MG: 20 | 90 days supply | Qty: 90 | Fill #1

## 2015-06-07 DIAGNOSIS — C61 Malignant neoplasm of prostate: Secondary | ICD-10-CM | POA: Diagnosis not present

## 2015-06-07 DIAGNOSIS — Z0000 Encounter for general adult medical examination without abnormal findings: Secondary | ICD-10-CM | POA: Diagnosis not present

## 2015-06-10 ENCOUNTER — Other Ambulatory Visit: Payer: Self-pay | Admitting: Urology

## 2015-06-21 ENCOUNTER — Telehealth: Payer: Self-pay | Admitting: *Deleted

## 2015-06-21 NOTE — Telephone Encounter (Signed)
Dr Alinda Money is requesting clearance for pt to have robot assisted laparoscopic radical prostatectomy, bil pelvic lymphadenectomy and umbilical hernia repair. Pt will be able to continue aspirin for the procedure. Will forward to dr hochrein for clearance.

## 2015-06-30 ENCOUNTER — Telehealth: Payer: Self-pay

## 2015-06-30 NOTE — Telephone Encounter (Signed)
Request for surgical clearance:  1. What type of surgery is being performed? Robot assisted laparoscopic radical prostatectomy, bilateral pelvic lymphadenotomy and umbilical hernia repair  2. When is this surgery scheduled? 07/14/2015  3. Are there any medications that need to be held prior to surgery and how long? Per Dr Alinda Money - pt to continue ASA 81 mg perioperatively  4. Name of the physician performing surgery? Dr Raynelle Bring  5. What is your office phone and fax number? [facility]  Phone [phone]  Fax [phone]

## 2015-06-30 NOTE — Telephone Encounter (Signed)
The patient walks nine miles per day.  He has no symptoms.   Therefore, based on ACC/AHA guidelines, the patient would be at acceptable risk for the planned procedure without further cardiovascular testing.

## 2015-06-30 NOTE — Telephone Encounter (Signed)
Clearance faxed to [facility] at [facility].

## 2015-07-08 DIAGNOSIS — M6281 Muscle weakness (generalized): Secondary | ICD-10-CM | POA: Diagnosis not present

## 2015-07-08 DIAGNOSIS — R3915 Urgency of urination: Secondary | ICD-10-CM | POA: Diagnosis not present

## 2015-07-08 DIAGNOSIS — R278 Other lack of coordination: Secondary | ICD-10-CM | POA: Diagnosis not present

## 2015-07-08 DIAGNOSIS — C61 Malignant neoplasm of prostate: Secondary | ICD-10-CM | POA: Diagnosis not present

## 2015-07-08 DIAGNOSIS — M6200 Separation of muscle (nontraumatic), unspecified site: Secondary | ICD-10-CM | POA: Diagnosis not present

## 2015-07-11 ENCOUNTER — Ambulatory Visit (HOSPITAL_COMMUNITY)
Admission: RE | Admit: 2015-07-11 | Discharge: 2015-07-11 | Disposition: A | Discharge status: Home / Self Care | Payer: 59 | Source: Ambulatory Visit | Attending: Urology | Admitting: Urology

## 2015-07-11 ENCOUNTER — Encounter (HOSPITAL_COMMUNITY): Payer: Self-pay

## 2015-07-11 ENCOUNTER — Encounter (HOSPITAL_COMMUNITY)
Admission: RE | Admit: 2015-07-11 | Discharge: 2015-07-11 | Disposition: A | Discharge status: Home / Self Care | Payer: 59 | Source: Ambulatory Visit | Attending: Urology | Admitting: Urology

## 2015-07-11 DIAGNOSIS — E7800 Pure hypercholesterolemia, unspecified: Secondary | ICD-10-CM | POA: Diagnosis not present

## 2015-07-11 DIAGNOSIS — M1990 Unspecified osteoarthritis, unspecified site: Secondary | ICD-10-CM | POA: Diagnosis not present

## 2015-07-11 DIAGNOSIS — N529 Male erectile dysfunction, unspecified: Secondary | ICD-10-CM | POA: Diagnosis not present

## 2015-07-11 DIAGNOSIS — I2510 Atherosclerotic heart disease of native coronary artery without angina pectoris: Secondary | ICD-10-CM | POA: Diagnosis not present

## 2015-07-11 DIAGNOSIS — Z87891 Personal history of nicotine dependence: Secondary | ICD-10-CM | POA: Diagnosis not present

## 2015-07-11 DIAGNOSIS — Z955 Presence of coronary angioplasty implant and graft: Secondary | ICD-10-CM | POA: Diagnosis not present

## 2015-07-11 DIAGNOSIS — Z01810 Encounter for preprocedural cardiovascular examination: Secondary | ICD-10-CM

## 2015-07-11 DIAGNOSIS — Z79899 Other long term (current) drug therapy: Secondary | ICD-10-CM | POA: Diagnosis not present

## 2015-07-11 DIAGNOSIS — Z01818 Encounter for other preprocedural examination: Secondary | ICD-10-CM | POA: Diagnosis not present

## 2015-07-11 DIAGNOSIS — C61 Malignant neoplasm of prostate: Secondary | ICD-10-CM | POA: Diagnosis not present

## 2015-07-11 HISTORY — DX: Tinnitus, unspecified ear: H93.19

## 2015-07-11 LAB — CBC
HCT: 48.4 % (ref 39.0–52.0)
Hemoglobin: 16.3 g/dL (ref 13.0–17.0)
MCH: 32.5 pg (ref 26.0–34.0)
MCHC: 33.7 g/dL (ref 30.0–36.0)
MCV: 96.4 fL (ref 78.0–100.0)
PLATELETS: 188 10*3/uL (ref 150–400)
RBC: 5.02 MIL/uL (ref 4.22–5.81)
RDW: 12.7 % (ref 11.5–15.5)
WBC: 6.7 10*3/uL (ref 4.0–10.5)

## 2015-07-11 LAB — BASIC METABOLIC PANEL
Anion gap: 5 (ref 5–15)
BUN: 13 mg/dL (ref 6–20)
CALCIUM: 9.8 mg/dL (ref 8.9–10.3)
CO2: 27 mmol/L (ref 22–32)
CREATININE: 0.99 mg/dL (ref 0.61–1.24)
Chloride: 102 mmol/L (ref 101–111)
Glucose, Bld: 91 mg/dL (ref 65–99)
Potassium: 4.9 mmol/L (ref 3.5–5.1)
SODIUM: 134 mmol/L — AB (ref 135–145)

## 2015-07-11 LAB — ABO/RH: ABO/RH(D): O POS

## 2015-07-11 NOTE — Patient Instructions (Addendum)
William Montgomery  07/11/2015   Your procedure is scheduled on: Thursday July 14, 2015   Report to [facility]  Entrance take [place]  elevators to 3rd floor to  [place] at 9:15 AM.  Call this number if you have problems the morning of surgery [phone]   Remember: ONLY 1 PERSON MAY GO WITH YOU TO SHORT STAY TO GET  READY MORNING OF [name].  Do not eat food or drink liquids :After Midnight.     Take these medicines the morning of surgery with A SIP OF WATER: none                               You may not have any metal on your body including hair pins and              piercings  Do not wear jewelry,  lotions, powders or colognes, deodorant             Men may shave face and neck.   Do not bring valuables to the hospital. [name].  Contacts, dentures or bridgework may not be worn into surgery.  Leave suitcase in the car. After surgery it may be brought to your room.              FOLLOW SURGEON'S INSTRUCTION IN REGARDS TO BOWEL PREPARATION PRIOR TO SURGICAL DATE                 Please read over the following fact sheets you were given:INCENTIVE SPIROMETER; BLOOD TRANSFUSION INFORMATION SHEET  _____________________________________________________________________             [facility] - Preparing for Surgery Before surgery, you can play an important role.  Because skin is not sterile, your skin needs to be as free of germs as possible.  You can reduce the number of germs on your skin by washing with CHG (chlorahexidine gluconate) soap before surgery.  CHG is an antiseptic cleaner which kills germs and bonds with the skin to continue killing germs even after washing. Please DO NOT use if you have an allergy to CHG or antibacterial soaps.  If your skin becomes reddened/irritated stop using the CHG and inform your nurse when you arrive at Short Stay. Do not shave (including legs and underarms)  for at least 48 hours prior to the first CHG shower.  You may shave your face/neck. Please follow these instructions carefully:  1.  Shower with CHG Soap the night before surgery and the  morning of Surgery.  2.  If you choose to wash your hair, wash your hair first as usual with your  normal  shampoo.  3.  After you shampoo, rinse your hair and body thoroughly to remove the  shampoo.                           4.  Use CHG as you would any other liquid soap.  You can apply chg directly  to the skin and wash                       Gently with a scrungie or clean washcloth.  5.  Apply the CHG Soap to your body ONLY FROM THE NECK DOWN.   Do not use on face/ open                           Wound or open sores. Avoid contact with eyes, ears mouth and genitals (private parts).                       Wash face,  Genitals (private parts) with your normal soap.             6.  Wash thoroughly, paying special attention to the area where your surgery  will be performed.  7.  Thoroughly rinse your body with warm water from the neck down.  8.  DO NOT shower/wash with your normal soap after using and rinsing off  the CHG Soap.                9.  Pat yourself dry with a clean towel.            10.  Wear clean pajamas.            11.  Place clean sheets on your bed the night of your first shower and do not  sleep with pets. Day of Surgery : Do not apply any lotions/deodorants the morning of surgery.  Please wear clean clothes to the hospital/surgery center.  FAILURE TO FOLLOW THESE INSTRUCTIONS MAY RESULT IN THE CANCELLATION OF YOUR SURGERY PATIENT SIGNATURE_________________________________  NURSE SIGNATURE__________________________________  ________________________________________________________________________   William Montgomery  An incentive spirometer is a tool that can help keep your lungs clear and active. This tool measures how well you are filling your lungs with each breath. Taking long deep  breaths may help reverse or decrease the chance of developing breathing (pulmonary) problems (especially infection) following:  A long period of time when you are unable to move or be active. BEFORE THE PROCEDURE   If the spirometer includes an indicator to show your best effort, your nurse or respiratory therapist will set it to a desired goal.  If possible, sit up straight or lean slightly forward. Try not to slouch.  Hold the incentive spirometer in an upright position. INSTRUCTIONS FOR USE   Sit on the edge of your bed if possible, or sit up as far as you can in bed or on a chair.  Hold the incentive spirometer in an upright position.  Breathe out normally.  Place the mouthpiece in your mouth and seal your lips tightly around it.  Breathe in slowly and as deeply as possible, raising the piston or the ball toward the top of the column.  Hold your breath for 3-5 seconds or for as long as possible. Allow the piston or ball to fall to the bottom of the column.  Remove the mouthpiece from your mouth and breathe out normally.  Rest for a few seconds and repeat Steps 1 through 7 at least 10 times every 1-2 hours when you are awake. Take your time and take a few normal breaths between deep breaths.  The spirometer may include an indicator to show your best effort. Use the indicator as a goal to work toward during each repetition.  After each set of 10 deep breaths, practice coughing to be sure your lungs are clear. If you have an incision (the cut made at the time of surgery), support your incision when coughing by placing a pillow or  rolled up towels firmly against it. Once you are able to get out of bed, walk around indoors and cough well. You may stop using the incentive spirometer when instructed by your caregiver.  RISKS AND COMPLICATIONS  Take your time so you do not get dizzy or light-headed.  If you are in pain, you may need to take or ask for pain medication before doing  incentive spirometry. It is harder to take a deep breath if you are having pain. AFTER USE  Rest and breathe slowly and easily.  It can be helpful to keep track of a log of your progress. Your caregiver can provide you with a simple table to help with this. If you are using the spirometer at home, follow these instructions: [place] IF:   You are having difficultly using the spirometer.  You have trouble using the spirometer as often as instructed.  Your pain medication is not giving enough relief while using the spirometer.  You develop fever of 100.5 F (38.1 C) or higher. SEEK IMMEDIATE MEDICAL CARE IF:   You cough up bloody sputum that had not been present before.  You develop fever of 102 F (38.9 C) or greater.  You develop worsening pain at or near the incision site. MAKE SURE YOU:   Understand these instructions.  Will watch your condition.  Will get help right away if you are not doing well or get worse. Document Released: 09/03/2006 Document Revised: 07/16/2011 Document Reviewed: 11/04/2006 ExitCare Patient Information 2014 Exit[employer], [place].   ________________________________________________________________________  WHAT IS A BLOOD TRANSFUSION? Blood Transfusion Information  A transfusion is the replacement of blood or some of its parts. Blood is made up of multiple cells which provide different functions.  Red blood cells carry oxygen and are used for blood loss replacement.  White blood cells fight against infection.  Platelets control bleeding.  Plasma helps clot blood.  Other blood products are available for specialized needs, such as hemophilia or other clotting disorders. BEFORE THE TRANSFUSION  Who gives blood for transfusions?   Healthy volunteers who are fully evaluated to make sure their blood is safe. This is blood bank blood. Transfusion therapy is the safest it has ever been in the practice of medicine. Before blood is taken from a  donor, a complete history is taken to make sure that person has no history of diseases nor engages in risky social behavior (examples are intravenous drug use or sexual activity with multiple partners). The donor's travel history is screened to minimize risk of transmitting infections, such as malaria. The donated blood is tested for signs of infectious diseases, such as HIV and hepatitis. The blood is then tested to be sure it is compatible with you in order to minimize the chance of a transfusion reaction. If you or a relative donates blood, this is often done in anticipation of surgery and is not appropriate for emergency situations. It takes many days to process the donated blood. RISKS AND COMPLICATIONS Although transfusion therapy is very safe and saves many lives, the main dangers of transfusion include:   Getting an infectious disease.  Developing a transfusion reaction. This is an allergic reaction to something in the blood you were given. Every precaution is taken to prevent this. The decision to have a blood transfusion has been considered carefully by your caregiver before blood is given. Blood is not given unless the benefits outweigh the risks. AFTER THE TRANSFUSION  Right after receiving a blood transfusion, you will usually  feel much better and more energetic. This is especially true if your red blood cells have gotten low (anemic). The transfusion raises the level of the red blood cells which carry oxygen, and this usually causes an energy increase.  The nurse administering the transfusion will monitor you carefully for complications. HOME CARE INSTRUCTIONS  No special instructions are needed after a transfusion. You may find your energy is better. Speak with your caregiver about any limitations on activity for underlying diseases you may have. SEEK MEDICAL CARE IF:   Your condition is not improving after your transfusion.  You develop redness or irritation at the intravenous (IV)  site. SEEK IMMEDIATE MEDICAL CARE IF:  Any of the following symptoms occur over the next 12 hours:  Shaking chills.  You have a temperature by mouth above 102 F (38.9 C), not controlled by medicine.  Chest, back, or muscle pain.  People around you feel you are not acting correctly or are confused.  Shortness of breath or difficulty breathing.  Dizziness and fainting.  You get a rash or develop hives.  You have a decrease in urine output.  Your urine turns a dark color or changes to pink, red, or brown. Any of the following symptoms occur over the next 10 days:  You have a temperature by mouth above 102 F (38.9 C), not controlled by medicine.  Shortness of breath.  Weakness after normal activity.  The white part of the eye turns yellow (jaundice).  You have a decrease in the amount of urine or are urinating less often.  Your urine turns a dark color or changes to pink, red, or brown. Document Released: 04/20/2000 Document Revised: 07/16/2011 Document Reviewed: 12/08/2007 [facility] Patient Information 2014 [place], [place].  _______________________________________________________________________

## 2015-07-11 NOTE — Progress Notes (Signed)
Clearance noted per cardiology / Dr Percival Spanish / epic 06/30/2015 EKG/epic 03/04/2015

## 2015-07-13 NOTE — H&P (Signed)
Chief Complaint Prostate Cancer    History of Present Illness William Montgomery is a 75 year old gentleman who was noted to have an elevated PSA of 5.09 and small prostate nodule at the right base prompting a TRUS biopsy of the prostate on 04/27/15 indicating Gleason 4+3=7 adenocarcinoma of the prostate with 3 out of 12 biopsy cores positive for malignancy. He has no family history of prostate cancer. He does have a history of coronary artery disease s/p cardiac stent placement but is otherwise healthy.     He does have a history of coronary artery disease status post cardiac stent placement in 1999 and again in 2009. He is followed by Dr. Minus Breeding and was most recently seen in November 2016. He denies any recent chest pain, dyspnea, or shortness of breath. He does remain quite active and walks 14 floors of stairs for exercise without developing shortness of breath or chest pain.    TNM stage: cT2a Nx Mx  PSA: 5.09  Gleason score: 4+3=7  Biopsy (04/27/15): 3/12 cores positive - R mid lateral (5%, 3+3=6), R base (60%, 4+3=7), R base latera (50%, 4+3=7)  Prostate volume: 67.2 cc    Nomogram  OC disease: 43%  EPE: 57%  SVI: 6%  LNI: 7%  PFS (surgery): 73% at 5 years, 58% at 10 years    Urinary function: IPSS is 27 on alpha blocker therapy.  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 SHIM score is 11. This is a low priority to him.   Past Medical History Problems  1. History of Coronary artery disease (I25.10) 2. History of arthritis (Z87.39) 3. History of hypercholesterolemia (Z86.39)  Surgical History Problems  1. History of Appendectomy 2. History of Cath Stent Placement 3. History of Lung Lobectomy Partial 4. History of Shoulder Surgery  Current Meds 1. Amitriptyline HCl - 25 MG Oral Tablet;  Therapy: (Recorded:26Oct2016) to Recorded 2. Co Q 10 CAPS;  Therapy: (Recorded:26Oct2016) to Recorded 3. Multi-Vitamin Oral Tablet;  Therapy:  (Recorded:26Oct2016) to Recorded 4. Niacin TABS;  Therapy: (Recorded:26Oct2016) to Recorded 5. PreserVision AREDS TABS;  Therapy: (Recorded:26Oct2016) to Recorded 6. Tamsulosin HCl - 0.4 MG Oral Capsule;  Therapy: (Recorded:26Oct2016) to Recorded 7. Vitamin C TABS;  Therapy: (Recorded:26Oct2016) to Recorded 8. Vitamin D3 CAPS;  Therapy: (Recorded:26Oct2016) to Recorded  Allergies Medication  1. No Known Drug Allergies  Family History Problems  1. Family history of Death of family member : Mother, Father   mother deceased at age 27; cancerfather deceased at age 73; Liver (drinking) 2. Family history of malignant neoplasm of vagina (Z80.49) : Mother 3. Denied: Family history of prostate cancer  Social History Problems    Denied: History of Alcohol use   Former smoker [phone])   smoked 1 ppd for 20 yrs & quit in 1966   Married   Number of children   2 daughters   Occupation   Chief Financial Officer  Review of Systems Constitutional, skin, eye, otolaryngeal, hematologic/lymphatic, cardiovascular, pulmonary, endocrine, musculoskeletal, gastrointestinal, neurological and psychiatric system(s) were reviewed and pertinent findings if present are noted and are otherwise negative.    Vitals Weight: 185 lb  BMI Calculated: 27.32   Physical Exam Constitutional: Well nourished and well developed . No acute distress.  ENT:. The ears and nose are normal in appearance.  Neck: The appearance of the neck is normal and no neck mass is present.  Pulmonary: No respiratory distress, normal respiratory rhythm and effort and clear bilateral breath sounds.  Cardiovascular: Heart rate and rhythm  are normal . No peripheral edema.  Abdomen: right lower quadrant incision site(s) well healed. The abdomen is soft and nontender. No masses are palpated. No CVA tenderness. No hernias are palpable.  An umbilical hernia is present, which is reducible. No hepatosplenomegaly noted.   Assessment Assessed   1. Adenocarcinoma of prostate (C61)    Discussion/Summary   1. Prostate cancer: He will undergo a right nerve sparing robot-assisted laparoscopic radical prostatectomy and pelvic lymphadenectomy and umbilical hernia repair.

## 2015-07-14 ENCOUNTER — Inpatient Hospital Stay (HOSPITAL_COMMUNITY)
Admission: RE | Admit: 2015-07-14 | Discharge: 2015-07-15 | DRG: 708 | Disposition: A | Discharge status: Home / Self Care | Payer: 59 | Source: Ambulatory Visit | Attending: Urology | Admitting: Urology

## 2015-07-14 ENCOUNTER — Encounter (HOSPITAL_COMMUNITY): Payer: Self-pay | Admitting: Certified Registered Nurse Anesthetist

## 2015-07-14 ENCOUNTER — Encounter (HOSPITAL_COMMUNITY)
Admission: RE | Disposition: A | Discharge status: Home / Self Care | Payer: Self-pay | Source: Ambulatory Visit | Attending: Urology

## 2015-07-14 ENCOUNTER — Inpatient Hospital Stay (HOSPITAL_COMMUNITY): Payer: 59 | Admitting: Certified Registered Nurse Anesthetist

## 2015-07-14 DIAGNOSIS — K429 Umbilical hernia without obstruction or gangrene: Secondary | ICD-10-CM | POA: Diagnosis not present

## 2015-07-14 DIAGNOSIS — Z79899 Other long term (current) drug therapy: Secondary | ICD-10-CM | POA: Diagnosis not present

## 2015-07-14 DIAGNOSIS — I2510 Atherosclerotic heart disease of native coronary artery without angina pectoris: Secondary | ICD-10-CM | POA: Diagnosis present

## 2015-07-14 DIAGNOSIS — C775 Secondary and unspecified malignant neoplasm of intrapelvic lymph nodes: Secondary | ICD-10-CM | POA: Diagnosis not present

## 2015-07-14 DIAGNOSIS — C61 Malignant neoplasm of prostate: Principal | ICD-10-CM | POA: Diagnosis present

## 2015-07-14 DIAGNOSIS — M1990 Unspecified osteoarthritis, unspecified site: Secondary | ICD-10-CM | POA: Diagnosis present

## 2015-07-14 DIAGNOSIS — Z87891 Personal history of nicotine dependence: Secondary | ICD-10-CM

## 2015-07-14 DIAGNOSIS — N529 Male erectile dysfunction, unspecified: Secondary | ICD-10-CM | POA: Diagnosis present

## 2015-07-14 DIAGNOSIS — E7800 Pure hypercholesterolemia, unspecified: Secondary | ICD-10-CM | POA: Diagnosis present

## 2015-07-14 DIAGNOSIS — I10 Essential (primary) hypertension: Secondary | ICD-10-CM | POA: Diagnosis not present

## 2015-07-14 DIAGNOSIS — Z955 Presence of coronary angioplasty implant and graft: Secondary | ICD-10-CM | POA: Diagnosis not present

## 2015-07-14 HISTORY — PX: ROBOT ASSISTED LAPAROSCOPIC RADICAL PROSTATECTOMY: SHX5141

## 2015-07-14 HISTORY — PX: UMBILICAL HERNIA REPAIR: SHX196

## 2015-07-14 HISTORY — PX: LYMPHADENECTOMY: SHX5960

## 2015-07-14 LAB — HEMOGLOBIN AND HEMATOCRIT, BLOOD
HEMATOCRIT: 38.4 % — AB (ref 39.0–52.0)
Hemoglobin: 13 g/dL (ref 13.0–17.0)

## 2015-07-14 LAB — TYPE AND SCREEN
ABO/RH(D): O POS
Antibody Screen: NEGATIVE

## 2015-07-14 LAB — GLUCOSE, CAPILLARY: GLUCOSE-CAPILLARY: 230 mg/dL — AB (ref 65–99)

## 2015-07-14 SURGERY — XI ROBOTIC ASSISTED LAPAROSCOPIC RADICAL PROSTATECTOMY LEVEL 2
Anesthesia: General | Site: Abdomen

## 2015-07-14 MED ORDER — MIDAZOLAM HCL 2 MG/2ML IJ SOLN
INTRAMUSCULAR | Status: AC
  Filled 2015-07-14: qty 2

## 2015-07-14 MED ORDER — SODIUM CHLORIDE 0.9 % IV BOLUS (SEPSIS)
1000.0000 mL | Freq: Once | INTRAVENOUS | Status: AC
  Administered 2015-07-14: 1000 mL via INTRAVENOUS

## 2015-07-14 MED ORDER — HEPARIN SODIUM (PORCINE) 1000 UNIT/ML IJ SOLN
INTRAMUSCULAR | Status: AC
  Filled 2015-07-14: qty 1

## 2015-07-14 MED ORDER — OXYCODONE HCL 5 MG PO TABS: 5.0000 mg | ORAL_TABLET | Freq: Once | ORAL | Status: DC | PRN

## 2015-07-14 MED ORDER — SUGAMMADEX SODIUM 200 MG/2ML IV SOLN
INTRAVENOUS | Status: DC | PRN
  Administered 2015-07-14: 200 mg via INTRAVENOUS

## 2015-07-14 MED ORDER — ONDANSETRON HCL 4 MG/2ML IJ SOLN
INTRAMUSCULAR | Status: DC | PRN
  Administered 2015-07-14: 4 mg via INTRAVENOUS

## 2015-07-14 MED ORDER — DIPHENHYDRAMINE HCL 12.5 MG/5ML PO ELIX: 12.5000 mg | ORAL_SOLUTION | Freq: Four times a day (QID) | ORAL | Status: DC | PRN

## 2015-07-14 MED ORDER — LIDOCAINE HCL (CARDIAC) 20 MG/ML IV SOLN
INTRAVENOUS | Status: AC
  Filled 2015-07-14: qty 5

## 2015-07-14 MED ORDER — ROCURONIUM BROMIDE 100 MG/10ML IV SOLN
INTRAVENOUS | Status: AC
  Filled 2015-07-14: qty 1

## 2015-07-14 MED ORDER — CEFAZOLIN SODIUM 1-5 GM-% IV SOLN
1.0000 g | Freq: Three times a day (TID) | INTRAVENOUS | Status: AC
  Administered 2015-07-14 – 2015-07-15 (×2): 1 g via INTRAVENOUS
  Filled 2015-07-14 (×2): qty 50

## 2015-07-14 MED ORDER — STERILE WATER FOR IRRIGATION IR SOLN
Status: DC | PRN
  Administered 2015-07-14: 1000 mL

## 2015-07-14 MED ORDER — HYDROMORPHONE HCL 2 MG/ML IJ SOLN
INTRAMUSCULAR | Status: AC
  Filled 2015-07-14: qty 1

## 2015-07-14 MED ORDER — ROCURONIUM BROMIDE 100 MG/10ML IV SOLN
INTRAVENOUS | Status: DC | PRN
  Administered 2015-07-14: 10 mg via INTRAVENOUS
  Administered 2015-07-14: 20 mg via INTRAVENOUS
  Administered 2015-07-14 (×2): 10 mg via INTRAVENOUS
  Administered 2015-07-14: 60 mg via INTRAVENOUS
  Administered 2015-07-14 (×2): 10 mg via INTRAVENOUS

## 2015-07-14 MED ORDER — MORPHINE SULFATE (PF) 10 MG/ML IV SOLN
2.0000 mg | INTRAVENOUS | Status: DC | PRN
  Administered 2015-07-14: 2 mg via INTRAVENOUS
  Filled 2015-07-14: qty 1

## 2015-07-14 MED ORDER — KETOROLAC TROMETHAMINE 15 MG/ML IJ SOLN
15.0000 mg | Freq: Four times a day (QID) | INTRAMUSCULAR | Status: DC
  Administered 2015-07-14 – 2015-07-15 (×3): 15 mg via INTRAVENOUS
  Filled 2015-07-14 (×5): qty 1

## 2015-07-14 MED ORDER — OXYCODONE HCL 5 MG/5ML PO SOLN
5.0000 mg | Freq: Once | ORAL | Status: DC | PRN
  Filled 2015-07-14: qty 5

## 2015-07-14 MED ORDER — MIDAZOLAM HCL 5 MG/5ML IJ SOLN
INTRAMUSCULAR | Status: DC | PRN
  Administered 2015-07-14: 2 mg via INTRAVENOUS

## 2015-07-14 MED ORDER — ACETAMINOPHEN 325 MG PO TABS: 650.0000 mg | ORAL_TABLET | ORAL | Status: DC | PRN

## 2015-07-14 MED ORDER — PROPOFOL 10 MG/ML IV BOLUS
INTRAVENOUS | Status: AC
  Filled 2015-07-14: qty 20

## 2015-07-14 MED ORDER — PHENYLEPHRINE HCL 10 MG/ML IJ SOLN
10.0000 mg | INTRAMUSCULAR | Status: DC | PRN
  Administered 2015-07-14: 50 ug/min via INTRAVENOUS

## 2015-07-14 MED ORDER — PROPOFOL 10 MG/ML IV BOLUS
INTRAVENOUS | Status: DC | PRN
  Administered 2015-07-14: 140 mg via INTRAVENOUS

## 2015-07-14 MED ORDER — SUGAMMADEX SODIUM 200 MG/2ML IV SOLN
INTRAVENOUS | Status: AC
  Filled 2015-07-14: qty 2

## 2015-07-14 MED ORDER — HYDROMORPHONE HCL 1 MG/ML IJ SOLN
INTRAMUSCULAR | Status: AC
  Filled 2015-07-14: qty 1

## 2015-07-14 MED ORDER — ROSUVASTATIN CALCIUM 20 MG PO TABS
20.0000 mg | ORAL_TABLET | Freq: Every day | ORAL | Status: DC
  Administered 2015-07-14 – 2015-07-15 (×2): 20 mg via ORAL
  Filled 2015-07-14 (×2): qty 1

## 2015-07-14 MED ORDER — ASPIRIN EC 81 MG PO TBEC
81.0000 mg | DELAYED_RELEASE_TABLET | Freq: Every day | ORAL | Status: DC
  Administered 2015-07-14 – 2015-07-15 (×2): 81 mg via ORAL
  Filled 2015-07-14 (×2): qty 1

## 2015-07-14 MED ORDER — KCL IN DEXTROSE-NACL 20-5-0.45 MEQ/L-%-% IV SOLN
INTRAVENOUS | Status: DC
  Administered 2015-07-14 – 2015-07-15 (×2): via INTRAVENOUS
  Filled 2015-07-14 (×5): qty 1000

## 2015-07-14 MED ORDER — ONDANSETRON HCL 4 MG/2ML IJ SOLN
4.0000 mg | INTRAMUSCULAR | Status: DC | PRN
  Administered 2015-07-14 (×2): 4 mg via INTRAVENOUS
  Filled 2015-07-14 (×2): qty 2

## 2015-07-14 MED ORDER — HYDROMORPHONE HCL 1 MG/ML IJ SOLN
INTRAMUSCULAR | Status: DC | PRN
  Administered 2015-07-14 (×4): 0.5 mg via INTRAVENOUS

## 2015-07-14 MED ORDER — CEFAZOLIN SODIUM-DEXTROSE 2-3 GM-% IV SOLR
INTRAVENOUS | Status: AC
  Filled 2015-07-14: qty 50

## 2015-07-14 MED ORDER — ONDANSETRON HCL 4 MG/2ML IJ SOLN
INTRAMUSCULAR | Status: AC
  Filled 2015-07-14: qty 2

## 2015-07-14 MED ORDER — HYDROCODONE-ACETAMINOPHEN 5-325 MG PO TABS: 1.0000 | ORAL_TABLET | Freq: Four times a day (QID) | ORAL | Status: DC | PRN

## 2015-07-14 MED ORDER — SODIUM CHLORIDE 0.9 % IR SOLN
Status: DC | PRN
  Administered 2015-07-14: 1000 mL via INTRAVESICAL

## 2015-07-14 MED ORDER — LIDOCAINE HCL (PF) 2 % IJ SOLN
INTRAMUSCULAR | Status: DC | PRN
  Administered 2015-07-14: 100 mg via INTRADERMAL

## 2015-07-14 MED ORDER — HYDROMORPHONE HCL 1 MG/ML IJ SOLN
0.2500 mg | INTRAMUSCULAR | Status: DC | PRN
  Administered 2015-07-14 (×2): 0.5 mg via INTRAVENOUS

## 2015-07-14 MED ORDER — LACTATED RINGERS IV SOLN
INTRAVENOUS | Status: DC | PRN
  Administered 2015-07-14 (×3): via INTRAVENOUS

## 2015-07-14 MED ORDER — FENTANYL CITRATE (PF) 100 MCG/2ML IJ SOLN
INTRAMUSCULAR | Status: DC | PRN
  Administered 2015-07-14 (×2): 50 ug via INTRAVENOUS
  Administered 2015-07-14: 100 ug via INTRAVENOUS
  Administered 2015-07-14: 50 ug via INTRAVENOUS

## 2015-07-14 MED ORDER — BUPIVACAINE-EPINEPHRINE (PF) 0.25% -1:200000 IJ SOLN
INTRAMUSCULAR | Status: AC
  Filled 2015-07-14: qty 30

## 2015-07-14 MED ORDER — AMITRIPTYLINE HCL 25 MG PO TABS
25.0000 mg | ORAL_TABLET | Freq: Every day | ORAL | Status: DC
  Administered 2015-07-14: 25 mg via ORAL
  Filled 2015-07-14 (×2): qty 1

## 2015-07-14 MED ORDER — BUPIVACAINE-EPINEPHRINE 0.25% -1:200000 IJ SOLN
INTRAMUSCULAR | Status: DC | PRN
  Administered 2015-07-14: 30 mL

## 2015-07-14 MED ORDER — DOCUSATE SODIUM 100 MG PO CAPS
100.0000 mg | ORAL_CAPSULE | Freq: Two times a day (BID) | ORAL | Status: DC
  Administered 2015-07-14 – 2015-07-15 (×2): 100 mg via ORAL
  Filled 2015-07-14 (×2): qty 1

## 2015-07-14 MED ORDER — NITROGLYCERIN 0.4 MG SL SUBL: 0.4000 mg | SUBLINGUAL_TABLET | SUBLINGUAL | Status: DC | PRN

## 2015-07-14 MED ORDER — LACTATED RINGERS IV SOLN
INTRAVENOUS | Status: DC | PRN
  Administered 2015-07-14: 13:00:00

## 2015-07-14 MED ORDER — SULFAMETHOXAZOLE-TRIMETHOPRIM 800-160 MG PO TABS: 1.0000 | ORAL_TABLET | Freq: Two times a day (BID) | ORAL | Status: DC

## 2015-07-14 MED ORDER — CEFAZOLIN SODIUM-DEXTROSE 2-3 GM-% IV SOLR
2.0000 g | INTRAVENOUS | Status: AC
  Administered 2015-07-14: 2 g via INTRAVENOUS

## 2015-07-14 MED ORDER — DEXAMETHASONE SODIUM PHOSPHATE 10 MG/ML IJ SOLN
INTRAMUSCULAR | Status: DC | PRN
  Administered 2015-07-14: 10 mg via INTRAVENOUS

## 2015-07-14 MED ORDER — FENTANYL CITRATE (PF) 250 MCG/5ML IJ SOLN
INTRAMUSCULAR | Status: AC
  Filled 2015-07-14: qty 5

## 2015-07-14 MED ORDER — DIPHENHYDRAMINE HCL 50 MG/ML IJ SOLN: 12.5000 mg | Freq: Four times a day (QID) | INTRAMUSCULAR | Status: DC | PRN

## 2015-07-14 MED ORDER — PROMETHAZINE HCL 25 MG/ML IJ SOLN: 6.2500 mg | INTRAMUSCULAR | Status: DC | PRN

## 2015-07-14 SURGICAL SUPPLY — 52 items
APPLICATOR COTTON TIP 6IN STRL (MISCELLANEOUS) ×5 IMPLANT
CATH FOLEY 2WAY SLVR 18FR 30CC (CATHETERS) ×5 IMPLANT
CATH ROBINSON RED A/P 16FR (CATHETERS) ×5 IMPLANT
CATH ROBINSON RED A/P 8FR (CATHETERS) ×5 IMPLANT
CATH TIEMANN FOLEY 18FR 5CC (CATHETERS) ×5 IMPLANT
CHLORAPREP W/TINT 26ML (MISCELLANEOUS) ×5 IMPLANT
CLIP LIGATING HEM O LOK PURPLE (MISCELLANEOUS) ×10 IMPLANT
COVER SURGICAL LIGHT HANDLE (MISCELLANEOUS) ×5 IMPLANT
COVER TIP SHEARS 8 DVNC (MISCELLANEOUS) ×3 IMPLANT
COVER TIP SHEARS 8MM DA VINCI (MISCELLANEOUS) ×2
CUTTER ECHEON FLEX ENDO 45 340 (ENDOMECHANICALS) ×5 IMPLANT
DECANTER SPIKE VIAL GLASS SM (MISCELLANEOUS) IMPLANT
DRAPE ARM DVNC X/XI (DISPOSABLE) ×12 IMPLANT
DRAPE COLUMN DVNC XI (DISPOSABLE) ×3 IMPLANT
DRAPE DA VINCI XI ARM (DISPOSABLE) ×8
DRAPE DA VINCI XI COLUMN (DISPOSABLE) ×2
DRAPE SURG IRRIG POUCH 19X23 (DRAPES) ×5 IMPLANT
DRSG TEGADERM 4X4.75 (GAUZE/BANDAGES/DRESSINGS) ×5 IMPLANT
ELECT REM PT RETURN 9FT ADLT (ELECTROSURGICAL) ×5
ELECTRODE REM PT RTRN 9FT ADLT (ELECTROSURGICAL) ×3 IMPLANT
GLOVE BIO SURGEON STRL SZ 6.5 (GLOVE) ×4 IMPLANT
GLOVE BIO SURGEONS STRL SZ 6.5 (GLOVE) ×1
GLOVE BIOGEL M STRL SZ7.5 (GLOVE) ×10 IMPLANT
GOWN STRL REUS W/TWL LRG LVL3 (GOWN DISPOSABLE) ×15 IMPLANT
HOLDER FOLEY CATH W/STRAP (MISCELLANEOUS) ×5 IMPLANT
IV LACTATED RINGERS 1000ML (IV SOLUTION) IMPLANT
LIQUID BAND (GAUZE/BANDAGES/DRESSINGS) IMPLANT
NDL SAFETY ECLIPSE 18X1.5 (NEEDLE) ×3 IMPLANT
NEEDLE HYPO 18GX1.5 SHARP (NEEDLE) ×2
PACK ROBOT UROLOGY CUSTOM (CUSTOM PROCEDURE TRAY) ×5 IMPLANT
RELOAD GREEN ECHELON 45 (STAPLE) ×5 IMPLANT
SEAL CANN UNIV 5-8 DVNC XI (MISCELLANEOUS) ×12 IMPLANT
SEAL XI 5MM-8MM UNIVERSAL (MISCELLANEOUS) ×8
SET TUBE IRRIG SUCTION NO TIP (IRRIGATION / IRRIGATOR) ×5 IMPLANT
SOLUTION ELECTROLUBE (MISCELLANEOUS) ×5 IMPLANT
SUT ETHILON 3 0 PS 1 (SUTURE) ×5 IMPLANT
SUT MNCRL 3 0 RB1 (SUTURE) ×3 IMPLANT
SUT MNCRL 3 0 VIOLET RB1 (SUTURE) ×3 IMPLANT
SUT MNCRL AB 4-0 PS2 18 (SUTURE) ×10 IMPLANT
SUT MONOCRYL 3 0 RB1 (SUTURE) ×4
SUT NOVA NAB DX-16 0-1 5-0 T12 (SUTURE) ×10 IMPLANT
SUT VIC AB 0 CT1 27 (SUTURE) ×2
SUT VIC AB 0 CT1 27XBRD ANTBC (SUTURE) ×3 IMPLANT
SUT VIC AB 0 UR5 27 (SUTURE) ×5 IMPLANT
SUT VIC AB 2-0 SH 27 (SUTURE) ×2
SUT VIC AB 2-0 SH 27X BRD (SUTURE) ×3 IMPLANT
SUT VICRYL 0 UR6 27IN ABS (SUTURE) ×10 IMPLANT
SYR 27GX1/2 1ML LL SAFETY (SYRINGE) ×5 IMPLANT
TOWEL OR 17X26 10 PK STRL BLUE (TOWEL DISPOSABLE) ×5 IMPLANT
TOWEL OR NON WOVEN STRL DISP B (DISPOSABLE) ×5 IMPLANT
TUBING INSUFFLATION 10FT LAP (TUBING) IMPLANT
WATER STERILE IRR 1500ML POUR (IV SOLUTION) IMPLANT

## 2015-07-14 NOTE — Anesthesia Postprocedure Evaluation (Signed)
Anesthesia Post Note  Patient: William Montgomery  Procedure(s) Performed: Procedure(s) (LRB): XI ROBOTIC ASSISTED LAPAROSCOPIC RADICAL PROSTATECTOMY LEVEL 2 (N/A) LYMPHADENECTOMY (Bilateral) HERNIA REPAIR UMBILICAL ADULT  Patient location during evaluation: PACU Anesthesia Type: General Pain management: pain level controlled Vital Signs Assessment: post-procedure vital signs reviewed and stable Respiratory status: spontaneous breathing Cardiovascular status: stable Anesthetic complications: no    Last Vitals:  Filed Vitals:   07/14/15 1537 07/14/15 1545  BP:  133/77  Pulse: 87 89  Temp:    Resp: 14 12    Last Pain:  Filed Vitals:   07/14/15 1547  PainSc: 0-No pain                 EDWARDS,Carolan Avedisian

## 2015-07-14 NOTE — Op Note (Addendum)
Preoperative diagnosis: Clinically localized adenocarcinoma of the prostate (clinical stage T2a Nx Mx)  Postoperative diagnosis: Clinically localized adenocarcinoma of the prostate (clinical stage T2a Nx Mx)  Procedure:  1. Robotic assisted laparoscopic radical prostatectomy (left nerve sparing) 2. Bilateral robotic assisted laparoscopic pelvic lymphadenectomy 3. Umbilical hernia repair  Surgeon: Pryor Curia. M.D.  Assistant(s): Debbrah Alar, PA-C  Resident: Dr. Jearld Adjutant  Anesthesia: General  Complications: None  EBL: 200 mL  IVF:  2000 mL crystalloid  Specimens: 1. Prostate and seminal vesicles 2. Right pelvic lymph nodes 3. Left pelvic lymph nodes  Disposition of specimens: Pathology  Drains: 1. 20 Fr coude catheter 2. # 19 Blake pelvic drain  Indication: William Montgomery is a 75 y.o. patient with clinically localized prostate cancer and a small umbilical hernia.  After a thorough review of the management options for treatment of prostate cancer, he elected to proceed with surgical therapy and the above procedure(s).  We have discussed the potential benefits and risks of the procedure, side effects of the proposed treatment, the likelihood of the patient achieving the goals of the procedure, and any potential problems that might occur during the procedure or recuperation. Informed consent has been obtained.  Description of procedure:  The patient was taken to the operating room and a general anesthetic was administered. He was given preoperative antibiotics, placed in the dorsal lithotomy position, and prepped and draped in the usual sterile fashion. Next a preoperative timeout was performed. A urethral catheter was placed into the bladder and a site was selected near the umbilicus for placement of the camera port. This was placed using a standard open Hassan technique which allowed entry into the peritoneal cavity under direct vision and without difficulty. A 12  mm port was placed and a pneumoperitoneum established. The camera was then used to inspect the abdomen and there was no evidence of any intra-abdominal injuries or other abnormalities. The remaining abdominal ports were then placed. 8 mm robotic ports were placed in the right lower quadrant, left lower quadrant, and far left lateral abdominal wall. A 5 mm port was placed in the right upper quadrant and a 12 mm port was placed in the right lateral abdominal wall for laparoscopic assistance. All ports were placed under direct vision without difficulty. The surgical cart was then docked.   Utilizing the cautery scissors, the bladder was reflected posteriorly allowing entry into the space of Retzius and identification of the endopelvic fascia and prostate. The periprostatic fat was then removed from the prostate allowing full exposure of the endopelvic fascia. The endopelvic fascia was then incised from the apex back to the base of the prostate bilaterally and the underlying levator muscle fibers were swept laterally off the prostate thereby isolating the dorsal venous complex. The dorsal vein was then stapled and divided with a 45 mm Flex Echelon stapler. Attention then turned to the bladder neck which was divided anteriorly thereby allowing entry into the bladder and exposure of the urethral catheter. The catheter balloon was deflated and the catheter was brought into the operative field and used to retract the prostate anteriorly. The posterior bladder neck was then examined and was divided allowing further dissection between the bladder and prostate posteriorly until the vasa deferentia and seminal vessels were identified. The vasa deferentia were isolated, divided, and lifted anteriorly. The seminal vesicles were dissected down to their tips with care to control the seminal vascular arterial blood supply. These structures were then lifted anteriorly and the space  between Denonvillier's fascia and the anterior  rectum was developed with a combination of sharp and blunt dissection. This isolated the vascular pedicles of the prostate.  The lateral prostatic fascia on the left side of the prostate was then sharply incised allowing release of the neurovascular bundle. The vascular pedicle of the prostate on the left side was then ligated with Weck clips between the prostate and neurovascular bundle and divided with sharp cold scissor dissection resulting in neurovascular bundle preservation. On the right side, a wide non nerve sparing dissection was performed with Weck clips used to ligate the vascular pedicle of the prostate. The neurovascular bundle on the left side was then separated off the apex of the prostate and urethra.   The urethra was then sharply transected allowing the prostate specimen to be disarticulated. The pelvis was copiously irrigated and hemostasis was ensured. There was no evidence for rectal injury.  Attention then turned to the right pelvic sidewall. The fibrofatty tissue between the external iliac vein, confluence of the iliac vessels, hypogastric artery, and Cooper's ligament was dissected free from the pelvic sidewall with care to preserve the obturator nerve. Weck clips were used for lymphostasis and hemostasis. An identical procedure was performed on the contralateral side and the lymphatic packets were removed for permanent pathologic analysis.  Attention then turned to the urethral anastomosis. A 2-0 Vicryl slip knot was placed between Denonvillier's fascia, the posterior bladder neck, and the posterior urethra to reapproximate these structures. A double-armed 3-0 Monocryl suture was then used to perform a 360 running tension-free anastomosis between the bladder neck and urethra. A new urethral catheter was then placed into the bladder and irrigated. There were no blood clots within the bladder and the anastomosis appeared to be watertight. A #19 Blake drain was then brought through  the left lateral 8 mm port site and positioned appropriately within the pelvis. It was secured to the skin with a nylon suture. The surgical cart was then undocked. The right lateral 12 mm port site was closed at the fascial level with a 0 Vicryl suture placed laparoscopically. All remaining ports were then removed under direct vision. The prostate specimen was removed intact within the Endopouch retrieval bag via the periumbilical camera port site. His fascial opening was then extened around the umbilicus where a small umbilical hernia was appreciated as diagnosed on his preoperative exam.  His fascial incision was extended to include his hernia. His fascial opening including his hernia was then closed with interrupted figure of eight 0 Novofil sutures until the fascial defect was completely closed. 0.25% Marcaine was then injected into all port sites and all incisions were reapproximated at the skin level with 4-0 Monocryl subcuticular sutures and Dermabond. The patient appeared to tolerate the procedure well and without complications. The patient was able to be extubated and transferred to the recovery unit in satisfactory condition.   Pryor Curia MD

## 2015-07-14 NOTE — Anesthesia Preprocedure Evaluation (Addendum)
Anesthesia Evaluation  Patient identified by MRN, date of birth, ID band Patient awake    Reviewed: Allergy & Precautions, H&P , NPO status , Patient's Chart, lab work & pertinent test results  History of Anesthesia Complications Negative for: history of anesthetic complications  Airway Mallampati: II  TM Distance: >3 FB Neck ROM: full    Dental no notable dental hx.    Pulmonary former smoker,    Pulmonary exam normal breath sounds clear to auscultation       Cardiovascular hypertension, + CAD  Normal cardiovascular exam Rhythm:regular Rate:Normal  Cardiac stent x2 to LAD, normal Echo in 2012 with normal stress  Walks 9 miles a day and continues to work   Neuro/Psych negative neurological ROS     GI/Hepatic negative GI ROS, Neg liver ROS,   Endo/Other  negative endocrine ROS  Renal/GU negative Renal ROS     Musculoskeletal  (+) Arthritis ,   Abdominal   Peds  Hematology negative hematology ROS (+)   Anesthesia Other Findings   Reproductive/Obstetrics negative OB ROS                            Anesthesia Physical Anesthesia Plan  ASA: III  Anesthesia Plan: General   Post-op Pain Management:    Induction: Intravenous  Airway Management Planned: Oral ETT  Additional Equipment:   Intra-op Plan:   Post-operative Plan: Extubation in OR  Informed Consent: I have reviewed the patients History and Physical, chart, labs and discussed the procedure including the risks, benefits and alternatives for the proposed anesthesia with the patient or authorized representative who has indicated his/her understanding and acceptance.   Dental Advisory Given  Plan Discussed with: Anesthesiologist, CRNA and Surgeon  Anesthesia Plan Comments:         Anesthesia Quick Evaluation

## 2015-07-14 NOTE — Anesthesia Procedure Notes (Signed)
Procedure Name: Intubation Date/Time: 07/14/2015 11:34 AM Performed by: Montel Clock Pre-anesthesia Checklist: Patient identified, Emergency Drugs available, Suction available, Patient being monitored and Timeout performed Patient Re-evaluated:Patient Re-evaluated prior to inductionOxygen Delivery Method: Circle system utilized Preoxygenation: Pre-oxygenation with 100% oxygen Intubation Type: IV induction Ventilation: Mask ventilation without difficulty Laryngoscope Size: Mac and 3 Grade View: Grade I Tube type: Oral Tube size: 7.5 mm Number of attempts: 1 Airway Equipment and Method: Stylet Placement Confirmation: ETT inserted through vocal cords under direct vision,  positive ETCO2 and breath sounds checked- equal and bilateral Secured at: 23 cm Tube secured with: Tape Dental Injury: Teeth and Oropharynx as per pre-operative assessment

## 2015-07-14 NOTE — Transfer of Care (Signed)
Immediate Anesthesia Transfer of Care Note  Patient: William Montgomery  Procedure(s) Performed: Procedure(s): XI ROBOTIC ASSISTED LAPAROSCOPIC RADICAL PROSTATECTOMY LEVEL 2 (N/A) LYMPHADENECTOMY (Bilateral) HERNIA REPAIR UMBILICAL ADULT  Patient Location: PACU  Anesthesia Type:General  Level of Consciousness:  sedated, patient cooperative and responds to stimulation  Airway & Oxygen Therapy:Patient Spontanous Breathing and Patient connected to face mask oxgen  Post-op Assessment:  Report given to PACU RN and Post -op Vital signs reviewed and stable  Post vital signs:  Reviewed and stable  Last Vitals:  Filed Vitals:   07/14/15 0904  BP: 136/77  Pulse: 71  Temp: 36.2 C  Resp: 18    Complications: No apparent anesthesia complications

## 2015-07-15 LAB — HEMOGLOBIN AND HEMATOCRIT, BLOOD
HCT: 31.6 % — ABNORMAL LOW (ref 39.0–52.0)
HEMATOCRIT: 30.8 % — AB (ref 39.0–52.0)
HEMOGLOBIN: 10.6 g/dL — AB (ref 13.0–17.0)
Hemoglobin: 10.6 g/dL — ABNORMAL LOW (ref 13.0–17.0)

## 2015-07-15 MED ORDER — BISACODYL 10 MG RE SUPP: 10.0000 mg | Freq: Once | RECTAL | Status: DC

## 2015-07-15 MED ORDER — BISACODYL 10 MG RE SUPP
10.0000 mg | Freq: Once | RECTAL | Status: AC
  Administered 2015-07-15: 10 mg via RECTAL
  Filled 2015-07-15: qty 1

## 2015-07-15 MED ORDER — HYDROCODONE-ACETAMINOPHEN 5-325 MG PO TABS
1.0000 | ORAL_TABLET | Freq: Four times a day (QID) | ORAL | Status: DC | PRN
  Administered 2015-07-15: 1 via ORAL
  Filled 2015-07-15: qty 1

## 2015-07-15 MED FILL — SULFAMETHOXAZOLE-TMP DS TAB: 800-160 | 3 days supply | Qty: 6 | Fill #0

## 2015-07-15 MED FILL — HYDROCODON-APAP 5-325: 5-325 | 3 days supply | Qty: 30 | Fill #0

## 2015-07-15 NOTE — Discharge Summary (Signed)
  Date of admission: 07/14/2015  Date of discharge: 07/15/2015  Admission diagnosis: Prostate Cancer  Discharge diagnosis: Prostate Cancer  History and Physical: For full details, please see admission history and physical. Briefly, William Montgomery is a 75 y.o. gentleman with localized prostate cancer.  After discussing management/treatment options, he elected to proceed with surgical treatment.  Hospital Course: William Montgomery was taken to the operating room on 07/14/2015 and underwent a robotic assisted laparoscopic radical prostatectomy. He tolerated this procedure well and without complications. Postoperatively, he was able to be transferred to a regular hospital room following recovery from anesthesia.  He was able to begin ambulating the night of surgery. He remained hemodynamically stable overnight.  He had excellent urine output with appropriately minimal output from his pelvic drain and his pelvic drain was removed on POD #1.  He was transitioned to oral pain medication, tolerated a clear liquid diet, and had met all discharge criteria and was able to be discharged home later on POD#1.  Laboratory values:  Recent Labs  07/14/15 1542 07/15/15 0425 07/15/15 1125  HGB 13.0 10.6* 10.6*  HCT 38.4* 30.8* 31.6*    Disposition: Home  Discharge instruction: He was instructed to be ambulatory but to refrain from heavy lifting, strenuous activity, or driving. He was instructed on urethral catheter care.  Discharge medications:     Medication List    STOP taking these medications        b complex vitamins tablet     CELEBREX 200 MG capsule  Generic drug:  celecoxib     cholecalciferol 1000 units tablet  Commonly known as:  VITAMIN D     CoQ10 100 MG Caps     multivitamin tablet     omeprazole 40 MG capsule  Commonly known as:  PRILOSEC     tamsulosin 0.4 MG Caps capsule  Commonly known as:  FLOMAX      TAKE these medications        amitriptyline 25 MG tablet  Commonly  known as:  ELAVIL  TAKE 1 TABLET BY MOUTH 2 TIMES A DAY FOR NEUROPATHIC PAIN     aspirin 81 MG tablet  Take 81 mg by mouth daily.     HYDROcodone-acetaminophen 5-325 MG tablet  Commonly known as:  NORCO  Take 1-2 tablets by mouth every 6 (six) hours as needed.     nitroGLYCERIN 0.4 MG SL tablet  Commonly known as:  NITROSTAT  PLACE 1 TABLET UNDER THE TONGUE EVERY 5 MINUTES AS NEEDED FOR CHEST PAIN.     rosuvastatin 20 MG tablet  Commonly known as:  CRESTOR  Take 1 tablet (20 mg total) by mouth daily.     sulfamethoxazole-trimethoprim 800-160 MG tablet  Commonly known as:  BACTRIM DS,SEPTRA DS  Take 1 tablet by mouth 2 (two) times daily. Start the day prior to foley removal appointment        Followup: He will followup in 1 week for catheter removal and to discuss his surgical pathology results.

## 2015-07-15 NOTE — Care Management Note (Signed)
Case Management Note  Patient Details  Name: CAMILLE THAU MRN: [id number] Date of Birth: 1941/04/06  Subjective/Objective:  75 y/o m admitted w/Prostate Ca. S/p Prostatectomy. From home.                  Action/Plan:d/c home no needs or orders.   Expected Discharge Date:                  Expected Discharge Plan:  Home/Self Care  In-House Referral:     Discharge planning Services  CM Consult  Post Acute Care Choice:    Choice offered to:     DME Arranged:    DME Agency:     HH Arranged:    [place] Agency:     Status of Service:  Completed, signed off  Medicare Important Message Given:    Date Medicare IM Given:    Medicare IM give by:    Date Additional Medicare IM Given:    Additional Medicare Important Message give by:     If discussed at [place] of Stay Meetings, dates discussed:    Additional Comments:  Dessa Phi, RN 07/15/2015, 12:50 PM

## 2015-07-15 NOTE — Progress Notes (Signed)
Patient ID: William Montgomery, male   DOB: March 06, 1941, 76 y.o.   MRN: [id number]  1 Day Post-Op Subjective: The patient is doing well.  No nausea or vomiting. Pain is adequately controlled.  Objective: Vital signs in last 24 hours: Temp:  [97.2 F (36.2 C)-98.7 F (37.1 C)] 98.7 F (37.1 C) (03/10 0559) Pulse Rate:  [71-91] 83 (03/10 0559) Resp:  [10-18] 18 (03/10 0559) BP: (107-142)/(62-87) 107/84 mmHg (03/10 0559) SpO2:  [97 %-100 %] 100 % (03/10 0559) Weight:  [82.101 kg (181 lb)] 82.101 kg (181 lb) (03/09 0904)  Intake/Output from previous day: 03/09 0701 - 03/10 0700 In: 6135 [P.O.:480; I.V.:4555; IV Piggyback:1100] Out: 2120 [Urine:1650; Drains:270; Blood:200] Intake/Output this shift:    Physical Exam:  General: Alert and oriented. CV: RRR Lungs: Clear bilaterally. GI: Soft, Nondistended. Incisions: Clean, dry, and intact Urine: Clear Extremities: Nontender, no erythema, no edema.  Lab Results:  Recent Labs  07/14/15 1542 07/15/15 0425  HGB 13.0 10.6*  HCT 38.4* 30.8*      Assessment/Plan: POD# 1 s/p robotic prostatectomy.  1) SL IVF 2) Ambulate, Incentive spirometry 3) Transition to oral pain medication 4) Dulcolax suppository 5) D/C pelvic drain 6) Recheck Hgb 7) Plan for likely discharge later today   Pryor Curia. MD   LOS: 1 day   William Montgomery,LES 07/15/2015, 7:15 AM

## 2015-07-15 NOTE — Consult Note (Signed)
   [facility] Inpatient Consult   07/15/2015  William Montgomery 05-Apr-1941 [id number]   Went to visit patient on behalf of [employer] program for [employer] with [employer]. His wife is a Adult nurse. She is also at bedside. [name] declines need for follow up phone call. [facility] information and contact information left at bedside. Appreciative of visit. Made inpatient RNCM aware.  Marthenia Rolling, MSN-Ed, RN,BSN [facility] Liaison [phone]

## 2015-07-15 NOTE — Discharge Instructions (Signed)
1. Activity:  You are encouraged to ambulate frequently (about every hour during waking hours) to help prevent blood clots from forming in your legs or lungs.  However, you should not engage in any heavy lifting (> 10-15 lbs), strenuous activity, or straining. 2. Diet: You should continue a clear liquid diet until passing gas from below.  Once this occurs, you may advance your diet to a soft diet that would be easy to digest (i.e soups, scrambled eggs, mashed potatoes, etc.) for 24 hours just as you would if getting over a bad stomach flu.  If tolerating this diet well for 24 hours, you may then begin eating regular food.  It will be normal to have some amount of bloating, nausea, and abdominal discomfort intermittently. 3. Prescriptions:  You will be provided a prescription for pain medication to take as needed.  If your pain is not severe enough to require the prescription pain medication, you may take Tylenol instead.  You should also take an over the counter stool softener (Colace 100 mg twice daily) to avoid straining with bowel movements as the pain medication may constipate you. Finally, you will also be provided a prescription for an antibiotic to begin the day prior to your return visit in the office for catheter removal. 4. Catheter care: You will be taught how to take care of the catheter by the nursing staff prior to discharge from the hospital.  You may use both a leg bag and the larger bedside bag but it is recommended to at least use the bigger bedside bag at nighttime as the leg bag is small and will fill up overnight and also does not drain as well when lying flat. You may periodically feel a strong urge to void with the catheter in place.  This is a bladder spasm and most often can occur when having a bowel movement or when you are moving around. It is typically self-limited and usually will stop after a few minutes.  You may use some Vaseline or Neosporin around the tip of the catheter to  reduce friction at the tip of the penis. 5. Incisions: You may remove your dressing bandages the 2nd day after surgery.  You most likely will have a few small staples in each of the incisions and once the bandages are removed, the incisions may stay open to air.  You may start showering (not soaking or bathing in water) 48 hours after surgery and the incisions simply need to be patted dry after the shower.  No additional care is needed. 6. What to call us about: You should call the office [phone]) if you develop fever > 101, persistent vomiting, or the catheter stops draining. Also, feel free to call with any other questions you may have and remember the handout that was provided to you as a reference preoperatively which answers many of the common questions that arise after surgery. 7. You may resume celebrex, advil, aleve, vitamins, and supplements 7 days after surgery.  Foley Catheter Care, Adult A Foley catheter is a soft, flexible tube that is placed into the bladder to drain urine. A Foley catheter may be inserted if:  You leak urine or are not able to control when you urinate (urinary incontinence).  You are not able to urinate when you need to (urinary retention).  You had prostate surgery or surgery on the genitals.  You have certain medical conditions, such as multiple sclerosis, dementia, or a spinal cord injury. If you are going  home with a Foley catheter in place, follow the instructions below. TAKING CARE OF THE CATHETER 1. Wash your hands with soap and water. 2. Using mild soap and warm water on a clean washcloth:  Clean the area on your body closest to the catheter insertion site using a circular motion, moving away from the catheter. Never wipe toward the catheter because this could sweep bacteria up into the urethra and cause infection.  Remove all traces of soap. Pat the area dry with a clean towel. For males, reposition the foreskin. 3. Attach the catheter to your leg  so there is no tension on the catheter. Use adhesive tape or a leg strap. If you are using adhesive tape, remove any sticky residue left behind by the previous tape you used. 4. Keep the drainage bag below the level of the bladder, but keep it off the floor. 5. Check throughout the day to be sure the catheter is working and urine is draining freely. Make sure the tubing does not become kinked. 6. Do not pull on the catheter or try to remove it. Pulling could damage internal tissues. TAKING CARE OF THE DRAINAGE BAGS You will be given two drainage bags to take home. One is a large overnight drainage bag, and the other is a smaller leg bag that fits underneath clothing. You may wear the overnight bag at any time, but you should never wear the smaller leg bag at night. Follow the instructions below for how to empty, change, and clean your drainage bags. Emptying the Drainage Bag You must empty your drainage bag when it is  - full or at least 2-3 times a day. 1. Wash your hands with soap and water. 2. Keep the drainage bag below your hips, below the level of your bladder. This stops urine from going back into the tubing and into your bladder. 3. Hold the dirty bag over the toilet or a clean container. 4. Open the pour spout at the bottom of the bag and empty the urine into the toilet or container. Do not let the pour spout touch the toilet, container, or any other surface. Doing so can place bacteria on the bag, which can cause an infection. 5. Clean the pour spout with a gauze pad or cotton ball that has rubbing alcohol on it. 6. Close the pour spout. 7. Attach the bag to your leg with adhesive tape or a leg strap. 8. Wash your hands well. Changing the Drainage Bag Change your drainage bag once a month or sooner if it starts to smell bad or look dirty. Below are steps to follow when changing the drainage bag. 1. Wash your hands with soap and water. 2. Pinch off the rubber catheter so that urine does  not spill out. 3. Disconnect the catheter tube from the drainage tube at the connection valve. Do not let the tubes touch any surface. 4. Clean the end of the catheter tube with an alcohol wipe. Use a different alcohol wipe to clean the end of the drainage tube. 5. Connect the catheter tube to the drainage tube of the clean drainage bag. 6. Attach the new bag to the leg with adhesive tape or a leg strap. Avoid attaching the new bag too tightly. 7. Wash your hands well. Cleaning the Drainage Bag 1. Wash your hands with soap and water. 2. Wash the bag in warm, soapy water. 3. Rinse the bag thoroughly with warm water. 4. Fill the bag with a solution of white vinegar  and water (1 cup vinegar to 1 qt warm water [.2 L vinegar to 1 L warm water]). Close the bag and soak it for 30 minutes in the solution. 5. Rinse the bag with warm water. 6. Hang the bag to dry with the pour spout open and hanging downward. 7. Store the clean bag (once it is dry) in a clean plastic bag. 8. Wash your hands well. PREVENTING INFECTION  Wash your hands before and after handling your catheter.  Take showers daily and wash the area where the catheter enters your body. Do not take baths. Replace wet leg straps with dry ones, if this applies.  Do not use powders, sprays, or lotions on the genital area. Only use creams, lotions, or ointments as directed by your caregiver.  For females, wipe from front to back after each bowel movement.  Drink enough fluids to keep your urine clear or pale yellow unless you have a fluid restriction.  Do not let the drainage bag or tubing touch or lie on the floor.  Wear cotton underwear to absorb moisture and to keep your skin drier. SEEK MEDICAL CARE IF:   Your urine is cloudy or smells unusually bad.  Your catheter becomes clogged.  You are not draining urine into the bag or your bladder feels full.  Your catheter starts to leak. SEEK IMMEDIATE MEDICAL CARE IF:   You have  pain, swelling, redness, or pus where the catheter enters the body.  You have pain in the abdomen, legs, lower back, or bladder.  You have a fever.  You see blood fill the catheter, or your urine is pink or red.  You have nausea, vomiting, or chills.  Your catheter gets pulled out. MAKE SURE YOU:   Understand these instructions.  Will watch your condition.  Will get help right away if you are not doing well or get worse.   This information is not intended to replace advice given to you by your health care provider. Make sure you discuss any questions you have with your health care provider.   Document Released: 04/23/2005 Document Revised: 09/07/2013 Document Reviewed: 04/14/2012 Elsevier Interactive Patient Education [employer].

## 2015-07-20 NOTE — Telephone Encounter (Signed)
See telephone note from 07-11-15

## 2015-07-24 ENCOUNTER — Encounter (HOSPITAL_COMMUNITY): Payer: Self-pay | Admitting: Family Medicine

## 2015-07-27 ENCOUNTER — Ambulatory Visit (INDEPENDENT_AMBULATORY_CARE_PROVIDER_SITE_OTHER): Payer: 59 | Admitting: Ophthalmology

## 2015-07-27 DIAGNOSIS — H353121 Nonexudative age-related macular degeneration, left eye, early dry stage: Secondary | ICD-10-CM | POA: Diagnosis not present

## 2015-07-27 DIAGNOSIS — H2513 Age-related nuclear cataract, bilateral: Secondary | ICD-10-CM | POA: Diagnosis not present

## 2015-07-27 DIAGNOSIS — H353112 Nonexudative age-related macular degeneration, right eye, intermediate dry stage: Secondary | ICD-10-CM

## 2015-07-27 DIAGNOSIS — H43813 Vitreous degeneration, bilateral: Secondary | ICD-10-CM | POA: Diagnosis not present

## 2015-07-28 ENCOUNTER — Encounter: Payer: Self-pay | Admitting: Family Medicine

## 2015-08-07 ENCOUNTER — Emergency Department (INDEPENDENT_AMBULATORY_CARE_PROVIDER_SITE_OTHER)
Admission: EM | Admit: 2015-08-07 | Discharge: 2015-08-07 | Disposition: A | Discharge status: Home / Self Care | Payer: 59 | Source: Home / Self Care | Attending: Family Medicine | Admitting: Family Medicine

## 2015-08-07 DIAGNOSIS — R69 Illness, unspecified: Principal | ICD-10-CM

## 2015-08-07 DIAGNOSIS — J111 Influenza due to unidentified influenza virus with other respiratory manifestations: Secondary | ICD-10-CM

## 2015-08-07 LAB — POCT CBC W AUTO DIFF (K'VILLE URGENT CARE)

## 2015-08-07 LAB — POCT INFLUENZA A/B
INFLUENZA A, POC: NEGATIVE
INFLUENZA B, POC: NEGATIVE

## 2015-08-07 MED ORDER — OSELTAMIVIR PHOSPHATE 75 MG PO CAPS: 75.0000 mg | ORAL_CAPSULE | Freq: Two times a day (BID) | ORAL | Status: DC

## 2015-08-07 MED ORDER — GUAIFENESIN-CODEINE 100-10 MG/5ML PO SOLN: ORAL | Status: DC

## 2015-08-07 NOTE — ED Provider Notes (Signed)
CSN: [id number]     Arrival date & time 08/07/15  1324 History   First MD Initiated Contact with Patient 08/07/15 1507     Chief Complaint  Patient presents with  . Fever  . Nasal Congestion      HPI Comments: Complains of 2 day history flu-like illness including myalgias, headache, fever/chills, fatigue, and cough.  Also has mild nasal congestion and sore throat.  Cough is non-productive and somewhat worse at night.  No pleuritic pain or shortness of breath.    The history is provided by the patient and a relative.    Past Medical History  Diagnosis Date  . Coronary artery disease 12/22/07    chest pain, cardiac cath demontrated 95% stenosis in the proximal portion of the LAD.  There was a praximal stent with 10-20% restenosis.  The mid LAD had 70% stenosis.  The diagonal had 80-90% stenosis.  The circumflex had luminal irregularities.  Right coronary was free of disease.  He had an EF of 65%.  He subsequently had drug-eluting stent in the proximal mid LAD.   ETT 01/30/11 was NEGATIVE.  Marland Kitchen Hyperlipidemia   . 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  . Colon polyps 07/2011    hyperplastic 07/25/2011  . Hearing loss 2004    Dr. Tamala Julian, ENT  . 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 Arthritis   . Diverticulosis of sigmoid colon 07/25/11    Severe (endoscopy by Dr. Sharlett Iles)  . Gallbladder polyp 2013    Noted 06/2011.  Stable on u/s f/u 10/2011.  Marland Kitchen Fatty liver disease, nonalcoholic 08/979    mild transaminasemia (Hep B and C testing neg)  . BPH (benign prostatic hyperplasia)   . Prostate cancer [facility]) 12/2014    Robot assisted radical prostatectomy 07/2015: locally advanced dz but with negative surgical margins.  . Tinnitus    Past Surgical History  Procedure Laterality Date  . Back surgery      C-Spine and L-Spine  . Lobectomy      right upper lobectomy  . Colonoscopy   02/26/02; 07/2011    Colonoscopy by Dr. Rowe Pavy 2003 was normal (+hx of polyps (adenomatous?) prior), 07/2011 showed one hyperplastic polyp.  . Cardiac catheterization    . Appendectomy  1953  . Coronary stent placement      x 2 (LAD)  . Cardiovascular stress test  2012    Normal  . 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 + other therapies (as of 05/18/15)  . Robot assisted laparoscopic radical prostatectomy N/A 07/14/2015    One positive pelvic LN.  Procedure: XI ROBOTIC ASSISTED LAPAROSCOPIC RADICAL PROSTATECTOMY LEVEL 2;  Surgeon: Raynelle Bring, MD;  Location: [facility];  Service: Urology;  Laterality: N/A;  . Lymphadenectomy Bilateral 07/14/2015    Procedure: LYMPHADENECTOMY;  Surgeon: Raynelle Bring, MD;  Location: [facility];  Service: Urology;  Laterality: Bilateral;  . Umbilical hernia repair  07/14/2015    Procedure: HERNIA REPAIR UMBILICAL ADULT;  Surgeon: Raynelle Bring, MD;  Location: [facility];  Service: Urology;;   Family History  Problem Relation Age of Onset  . Cervical cancer Mother     deceased  . Cirrhosis Father   . Colon cancer Neg Hx    Social History  Substance Use Topics  . Smoking  status: Former Smoker -- 0.50 packs/day for 20 years    Types: Cigarettes    Quit date: 05/07/1976  . Smokeless tobacco: Former Systems developer    Types: Chew    Quit date: 05/07/1998     Comment: Scoal  . Alcohol Use: Yes     Comment: 1 beer every 6 months    Review of Systems + sore throat + cough No pleuritic pain No wheezing + nasal congestion + post-nasal drainage No sinus pain/pressure No itchy/red eyes No earache No hemoptysis No SOB + fever, + chills No nausea No vomiting No abdominal pain No diarrhea No urinary symptoms No skin rash + fatigue + myalgias + headache Used OTC meds without relief  Allergies  Review of patient's allergies indicates no known allergies.  Home Medications   Prior to  Admission medications   Medication Sig Start Date End Date Taking? Authorizing Provider  amitriptyline (ELAVIL) 25 MG tablet TAKE 1 TABLET BY MOUTH 2 TIMES A DAY FOR NEUROPATHIC PAIN 10/25/14   Tammi Sou, MD  aspirin 81 MG tablet Take 81 mg by mouth daily.    Historical Provider, MD  guaiFENesin-codeine 100-10 MG/5ML syrup Take 45m by mouth at bedtime as needed for cough 08/07/15   SKandra Nicolas MD  HYDROcodone-acetaminophen (NORCO) 5-325 MG tablet Take 1-2 tablets by mouth every 6 (six) hours as needed. 07/14/15   ADebbrah Alar PA-C  nitroGLYCERIN (NITROSTAT) 0.4 MG SL tablet PLACE 1 TABLET UNDER THE TONGUE EVERY 5 MINUTES AS NEEDED FOR CHEST PAIN. 06/21/14   PTammi Sou MD  oseltamivir (TAMIFLU) 75 MG capsule Take 1 capsule (75 mg total) by mouth every 12 (twelve) hours. 08/07/15   SKandra Nicolas MD  rosuvastatin (CRESTOR) 20 MG tablet Take 1 tablet (20 mg total) by mouth daily. 03/04/15   JMinus Breeding MD  sulfamethoxazole-trimethoprim (BACTRIM DS,SEPTRA DS) 800-160 MG tablet Take 1 tablet by mouth 2 (two) times daily. Start the day prior to foley removal appointment 07/14/15   ADebbrah Alar PA-C   Meds Ordered and Administered this Visit  Medications - No data to display  BP 122/77 mmHg  Pulse 80  Temp(Src) 97.5 F (36.4 C) (Oral)  Ht 5' 9"  (1.753 m)  Wt 172 lb 8 oz (78.245 kg)  BMI 25.46 kg/m2  SpO2 98% No data found.   Physical Exam Nursing notes and Vital Signs reviewed. Appearance:  Patient appears stated age, and in no acute distress Eyes:  Pupils are equal, round, and reactive to light and accomodation.  Extraocular movement is intact.  Conjunctivae are not inflamed  Ears:  Canals normal.  Tympanic membranes normal.  Nose:  Mildly congested turbinates.  No sinus tenderness.  Pharynx:  Normal Neck:  Supple.  Tender enlarged posterior/lateral nodes are palpated bilaterally  Lungs:  Clear to auscultation.  Breath sounds are equal.  Moving air well. Heart:  Regular  rate and rhythm without murmurs, rubs, or gallops.  Abdomen:  Nontender without masses or hepatosplenomegaly.  Bowel sounds are present.  No CVA or flank tenderness.  Extremities:  No edema.  Skin:  No rash present.   ED Course  Procedures none    Labs Reviewed  POCT CBC W AUTO DIFF ([facility]):  WBC 3.8; LY 31.2; MO 12.2; GR 56.6; Hgb 13.6; Platelets 228   POCT INFLUENZA A/B negative     MDM   1. Influenza-like illness; ?false negative flu test    Begin Tamiflu.  [facility]  [facility].  Take plain guaifenesin (1253m extended release tabs such as Mucinex) twice daily, with plenty of water, for cough and congestion.  Get adequate rest.   May use Afrin nasal spray (or generic oxymetazoline) twice daily for about 5 days and then discontinue.  Also recommend using saline nasal spray several times daily and saline nasal irrigation (AYR is a common brand).  Use Nasacort nasal spray each morning after using Afrin nasal spray and saline nasal irrigation. Try warm salt water gargles for sore throat.  Stop all antihistamines for now, and other non-prescription cough/cold preparations.  Follow-up with family doctor if not improving about 5 days.     SKandra Nicolas MD 08/07/15 2145

## 2015-08-07 NOTE — Discharge Instructions (Signed)
Take plain guaifenesin (1265m extended release tabs such as Mucinex) twice daily, with plenty of water, for cough and congestion.  Get adequate rest.   May use Afrin nasal spray (or generic oxymetazoline) twice daily for about 5 days and then discontinue.  Also recommend using saline nasal spray several times daily and saline nasal irrigation (AYR is a common brand).  Use Nasacort nasal spray each morning after using Afrin nasal spray and saline nasal irrigation. Try warm salt water gargles for sore throat.  Stop all antihistamines for now, and other non-prescription cough/cold preparations.  Follow-up with family doctor if not improving about 5 days.

## 2015-08-07 NOTE — ED Notes (Signed)
Started Friday night with tiredness, low grade fever, chills runny nose.  Ears are stopped up, and has a sore throat from coughing.

## 2015-08-11 ENCOUNTER — Other Ambulatory Visit: Payer: Self-pay | Admitting: Family Medicine

## 2015-08-11 DIAGNOSIS — M6200 Separation of muscle (nontraumatic), unspecified site: Secondary | ICD-10-CM | POA: Diagnosis not present

## 2015-08-11 DIAGNOSIS — R278 Other lack of coordination: Secondary | ICD-10-CM | POA: Diagnosis not present

## 2015-08-11 DIAGNOSIS — M6281 Muscle weakness (generalized): Secondary | ICD-10-CM | POA: Diagnosis not present

## 2015-08-11 DIAGNOSIS — N393 Stress incontinence (female) (male): Secondary | ICD-10-CM | POA: Diagnosis not present

## 2015-08-11 MED FILL — ROSUVASTATIN CALCIUM 20 MG: 20 | 90 days supply | Qty: 90 | Fill #2

## 2015-08-11 MED FILL — AMITRIPTYLINE HCL 25 MG TAB: 25 | 90 days supply | Qty: 180 | Fill #0

## 2015-08-11 NOTE — Telephone Encounter (Signed)
RF request for amitriptyline LOV: 03/25/15 Next ov: 09/13/15 Last written: 10/25/14 #180 w/ 1RF  Please advise. Thanks.

## 2015-08-18 DIAGNOSIS — N393 Stress incontinence (female) (male): Secondary | ICD-10-CM | POA: Diagnosis not present

## 2015-08-18 DIAGNOSIS — M6200 Separation of muscle (nontraumatic), unspecified site: Secondary | ICD-10-CM | POA: Diagnosis not present

## 2015-08-18 DIAGNOSIS — C61 Malignant neoplasm of prostate: Secondary | ICD-10-CM | POA: Diagnosis not present

## 2015-08-18 DIAGNOSIS — R278 Other lack of coordination: Secondary | ICD-10-CM | POA: Diagnosis not present

## 2015-08-18 DIAGNOSIS — M6281 Muscle weakness (generalized): Secondary | ICD-10-CM | POA: Diagnosis not present

## 2015-08-31 DIAGNOSIS — Z0000 Encounter for general adult medical examination without abnormal findings: Secondary | ICD-10-CM | POA: Diagnosis not present

## 2015-09-01 ENCOUNTER — Encounter: Payer: Self-pay | Admitting: Family Medicine

## 2015-09-09 DIAGNOSIS — M6200 Separation of muscle (nontraumatic), unspecified site: Secondary | ICD-10-CM | POA: Diagnosis not present

## 2015-09-09 DIAGNOSIS — M6281 Muscle weakness (generalized): Secondary | ICD-10-CM | POA: Diagnosis not present

## 2015-09-09 DIAGNOSIS — R278 Other lack of coordination: Secondary | ICD-10-CM | POA: Diagnosis not present

## 2015-09-09 DIAGNOSIS — N393 Stress incontinence (female) (male): Secondary | ICD-10-CM | POA: Diagnosis not present

## 2015-09-23 ENCOUNTER — Ambulatory Visit (INDEPENDENT_AMBULATORY_CARE_PROVIDER_SITE_OTHER): Payer: 59 | Admitting: Family Medicine

## 2015-09-23 ENCOUNTER — Encounter: Payer: Self-pay | Admitting: Family Medicine

## 2015-09-23 VITALS — BP 118/80 | HR 76 | Temp 97.6°F | Resp 16 | Ht 68.0 in | Wt 175.8 lb

## 2015-09-23 DIAGNOSIS — E785 Hyperlipidemia, unspecified: Secondary | ICD-10-CM | POA: Diagnosis not present

## 2015-09-23 DIAGNOSIS — N393 Stress incontinence (female) (male): Secondary | ICD-10-CM | POA: Diagnosis not present

## 2015-09-23 DIAGNOSIS — R278 Other lack of coordination: Secondary | ICD-10-CM | POA: Diagnosis not present

## 2015-09-23 DIAGNOSIS — R3915 Urgency of urination: Secondary | ICD-10-CM | POA: Diagnosis not present

## 2015-09-23 DIAGNOSIS — M6281 Muscle weakness (generalized): Secondary | ICD-10-CM | POA: Diagnosis not present

## 2015-09-23 DIAGNOSIS — C61 Malignant neoplasm of prostate: Secondary | ICD-10-CM | POA: Diagnosis not present

## 2015-09-23 DIAGNOSIS — M6200 Separation of muscle (nontraumatic), unspecified site: Secondary | ICD-10-CM | POA: Diagnosis not present

## 2015-09-23 DIAGNOSIS — D62 Acute posthemorrhagic anemia: Secondary | ICD-10-CM

## 2015-09-23 DIAGNOSIS — I2510 Atherosclerotic heart disease of native coronary artery without angina pectoris: Secondary | ICD-10-CM

## 2015-09-23 DIAGNOSIS — R030 Elevated blood-pressure reading, without diagnosis of hypertension: Secondary | ICD-10-CM

## 2015-09-23 LAB — LIPID PANEL
CHOLESTEROL: 142 mg/dL (ref 0–200)
HDL: 37.1 mg/dL — ABNORMAL LOW (ref >39.00)
NonHDL: 104.97
Total CHOL/HDL Ratio: 4
Triglycerides: 216 mg/dL — ABNORMAL HIGH (ref 0.0–149.0)
VLDL: 43.2 mg/dL — ABNORMAL HIGH (ref 0.0–40.0)

## 2015-09-23 LAB — CBC WITH DIFFERENTIAL/PLATELET
BASOS ABS: 0 10*3/uL (ref 0.0–0.1)
Basophils Relative: 0.3 % (ref 0.0–3.0)
EOS PCT: 5 % (ref 0.0–5.0)
Eosinophils Absolute: 0.3 10*3/uL (ref 0.0–0.7)
HCT: 47.2 % (ref 39.0–52.0)
HEMOGLOBIN: 15.8 g/dL (ref 13.0–17.0)
Lymphocytes Relative: 27.8 % (ref 12.0–46.0)
Lymphs Abs: 1.8 10*3/uL (ref 0.7–4.0)
MCHC: 33.5 g/dL (ref 30.0–36.0)
MCV: 94.2 fl (ref 78.0–100.0)
MONO ABS: 0.5 10*3/uL (ref 0.1–1.0)
MONOS PCT: 7.7 % (ref 3.0–12.0)
NEUTROS PCT: 59.2 % (ref 43.0–77.0)
Neutro Abs: 3.9 10*3/uL (ref 1.4–7.7)
Platelets: 199 10*3/uL (ref 150.0–400.0)
RBC: 5.01 Mil/uL (ref 4.22–5.81)
RDW: 14.5 % (ref 11.5–15.5)
WBC: 6.6 10*3/uL (ref 4.0–10.5)

## 2015-09-23 LAB — ALT: ALT: 32 U/L (ref 0–53)

## 2015-09-23 LAB — AST: AST: 28 U/L (ref 0–37)

## 2015-09-23 LAB — LDL CHOLESTEROL, DIRECT: LDL DIRECT: 63 mg/dL

## 2015-09-23 NOTE — Progress Notes (Signed)
Pre visit review using our clinic review tool, if applicable. No additional management support is needed unless otherwise documented below in the visit note. 

## 2015-09-23 NOTE — Progress Notes (Signed)
OFFICE VISIT  09/23/2015   CC:  Chief Complaint  Patient presents with  . Follow-up    Pt is fasting.     HPI:    Patient is a 75 y.o. Caucasian male who presents for 6 mo f/u hyperlipidemia, elevated bp w/out dx of HTN, hx of CAD, and recent prostate cancer treated with radical prostatectomy.  He is doing well s/p surgery for his prostate ca.  Active surveillance is the current treatment approach.  He went back to work 3 weeks ago.  He is upset about the level of incontinence he has as a result of his surgery.  He got back on kefir and home bp monitoring shows normal measurements. Taking crestor and has no myalgias.  No CP or SOB or palpitations.  He has f/u with his cardiologist in about 3 mo.   Past Medical History  Diagnosis Date  . Coronary artery disease 12/22/07    chest pain, cardiac cath demontrated 95% stenosis in the proximal portion of the LAD.  There was a praximal stent with 10-20% restenosis.  The mid LAD had 70% stenosis.  The diagonal had 80-90% stenosis.  The circumflex had luminal irregularities.  Right coronary was free of disease.  He had an EF of 65%.  He subsequently had drug-eluting stent in the proximal mid LAD.   ETT 01/30/11 was NEGATIVE.  Marland Kitchen Hyperlipidemia   . 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  . Colon polyps 07/2011    hyperplastic 07/25/2011  . Hearing loss 2004    Dr. Tamala Julian, ENT  . 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 Arthritis   . Diverticulosis of sigmoid colon 07/25/11    Severe (endoscopy by Dr. Sharlett Iles)  . Gallbladder polyp 2013    Noted 06/2011.  Stable on u/s f/u 10/2011.  Marland Kitchen Fatty liver disease, nonalcoholic 05/624    mild transaminasemia (Hep B and C testing neg)  . BPH (benign prostatic hyperplasia)   . Prostate cancer [facility]) 12/2014    Robot assisted radical prostatectomy 07/2015: locally advanced dz but with  negative surgical margins.  As of 08/31/15 PSA undetectable: plan is active surveillance per urology.  . Tinnitus     Past Surgical History  Procedure Laterality Date  . Back surgery      C-Spine and L-Spine  . Lobectomy      right upper lobectomy  . Colonoscopy  02/26/02; 07/2011    Colonoscopy by Dr. Rowe Pavy 2003 was normal (+hx of polyps (adenomatous?) prior), 07/2011 showed one hyperplastic polyp.  . Cardiac catheterization    . Appendectomy  1953  . Coronary stent placement      x 2 (LAD)  . Cardiovascular stress test  2012    Normal  . 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 + other therapies (as of 05/18/15)  . Robot assisted laparoscopic radical prostatectomy N/A 07/14/2015    One positive pelvic LN.  Procedure: XI ROBOTIC ASSISTED LAPAROSCOPIC RADICAL PROSTATECTOMY LEVEL 2;  Surgeon: Raynelle Bring, MD;  Location[facility];  Service: Urology;  Laterality: N/A;  . Lymphadenectomy Bilateral 07/14/2015    Procedure: LYMPHADENECTOMY;  Surgeon: Raynelle Bring, MD;  [facility]: [facility];  Service: Urology;  Laterality: Bilateral;  . Umbilical hernia repair  07/14/2015    Procedure: HERNIA REPAIR UMBILICAL ADULT;  Surgeon: Raynelle Bring, MD;  Location: [facility];  Service: Urology;;    Outpatient Prescriptions Prior to Visit  Medication Sig Dispense Refill  . amitriptyline (ELAVIL) 25 MG tablet TAKE 1 TABLET BY MOUTH 2 TIMES A DAY FOR NEUROPATHIC PAIN 180 tablet 3  . aspirin 81 MG tablet Take 81 mg by mouth daily.    . nitroGLYCERIN (NITROSTAT) 0.4 MG SL tablet PLACE 1 TABLET UNDER THE TONGUE EVERY 5 MINUTES AS NEEDED FOR CHEST PAIN. 15 tablet 2  . rosuvastatin (CRESTOR) 20 MG tablet Take 1 tablet (20 mg total) by mouth daily. 30 tablet 9  . guaiFENesin-codeine 100-10 MG/5ML syrup Take 73m by mouth at bedtime as needed for cough (Patient not taking: Reported on 09/23/2015) 120 mL 0  . HYDROcodone-acetaminophen (NORCO) 5-325  MG tablet Take 1-2 tablets by mouth every 6 (six) hours as needed. (Patient not taking: Reported on 09/23/2015) 30 tablet 0  . oseltamivir (TAMIFLU) 75 MG capsule Take 1 capsule (75 mg total) by mouth every 12 (twelve) hours. (Patient not taking: Reported on 09/23/2015) 10 capsule 0  . sulfamethoxazole-trimethoprim (BACTRIM DS,SEPTRA DS) 800-160 MG tablet Take 1 tablet by mouth 2 (two) times daily. Start the day prior to foley removal appointment (Patient not taking: Reported on 09/23/2015) 6 tablet 0   No facility-administered medications prior to visit.    No Known Allergies  ROS As per HPI  PE: Blood pressure 118/80, pulse 76, temperature 97.6 F (36.4 C), temperature source Oral, resp. rate 16, height 5' 8"  (1.727 m), weight 175 lb 12 oz (79.72 kg), SpO2 93 %. Gen: Alert, well appearing.  Patient is oriented to person, place, time, and situation. CV: RRR, no m/r/g.   LUNGS: CTA bilat, nonlabored resps, good aeration in all lung fields. EXT: no clubbing, cyanosis, or edema.    LABS:  Lab Results  Component Value Date   TSH 3.192 03/18/2013   Lab Results  Component Value Date   WBC 6.7 07/11/2015   HGB 10.6* 07/15/2015   HCT 31.6* 07/15/2015   MCV 96.4 07/11/2015   PLT 188 07/11/2015   Lab Results  Component Value Date   CREATININE 0.99 07/11/2015   BUN 13 07/11/2015   NA 134* 07/11/2015   K 4.9 07/11/2015   CL 102 07/11/2015   CO2 27 07/11/2015   Lab Results  Component Value Date   ALT 43 03/19/2014   AST 35 03/19/2014   ALKPHOS 80 03/19/2014   BILITOT 1.1 03/19/2014   Lab Results  Component Value Date   CHOL 140 09/16/2014   Lab Results  Component Value Date   HDL 37.50* 09/16/2014   Lab Results  Component Value Date   LDLCALC 56 09/16/2013   Lab Results  Component Value Date   TRIG 235.0* 09/16/2014   Lab Results  Component Value Date   CHOLHDL 4 09/16/2014   IMPRESSION AND PLAN:  1) Hyperlipidemia: tolerating crestor.  FLP and AST/ALT  today.  2) Elevated bp w/out dx of HTN: bp's normal on kefir (his "natural" bp reducer).  3) CAD: asymptomatic.  Continue ASA and statin and routine cardiology f/u.  4) Prostate cancer: had mild postoperative anemia, no transfusion was required.  Not on iron currently.  Will check CBC today and if Hb not >10.6 will start daily iron supplement. He'll continue with active surveillance with urology.  An After Visit Summary was printed and given to the patient.  FOLLOW UP: Return in about 6 months (around 03/25/2016) for annual CPE (fasting).  Signed:  Crissie Sickles, MD           09/23/2015

## 2015-09-26 ENCOUNTER — Encounter: Payer: Self-pay | Admitting: Family Medicine

## 2015-10-07 DIAGNOSIS — N393 Stress incontinence (female) (male): Secondary | ICD-10-CM | POA: Diagnosis not present

## 2015-10-07 DIAGNOSIS — M6281 Muscle weakness (generalized): Secondary | ICD-10-CM | POA: Diagnosis not present

## 2015-10-07 DIAGNOSIS — R278 Other lack of coordination: Secondary | ICD-10-CM | POA: Diagnosis not present

## 2015-10-07 DIAGNOSIS — M6200 Separation of muscle (nontraumatic), unspecified site: Secondary | ICD-10-CM | POA: Diagnosis not present

## 2015-11-07 ENCOUNTER — Other Ambulatory Visit: Payer: Self-pay | Admitting: Family Medicine

## 2015-11-07 MED FILL — CELECOXIB 200 MG CAPSULE: 200 | 30 days supply | Qty: 60 | Fill #0

## 2015-11-07 MED FILL — ROSUVASTATIN CALCIUM 20 MG: 20 | 30 days supply | Qty: 30 | Fill #3

## 2015-11-07 MED FILL — AMITRIPTYLINE HCL 25 MG TAB: 25 | 90 days supply | Qty: 180 | Fill #1

## 2015-11-07 NOTE — Telephone Encounter (Signed)
RF request for celeoxib LOV: 09/23/15 Next ov: 03/23/16 Last written: 03/26/12 #60 w/ 3RF  Please advise. Thanks.

## 2015-11-11 DIAGNOSIS — N393 Stress incontinence (female) (male): Secondary | ICD-10-CM | POA: Diagnosis not present

## 2015-11-11 DIAGNOSIS — M6281 Muscle weakness (generalized): Secondary | ICD-10-CM | POA: Diagnosis not present

## 2015-11-11 DIAGNOSIS — R278 Other lack of coordination: Secondary | ICD-10-CM | POA: Diagnosis not present

## 2015-11-11 DIAGNOSIS — R3915 Urgency of urination: Secondary | ICD-10-CM | POA: Diagnosis not present

## 2015-11-11 DIAGNOSIS — M6200 Separation of muscle (nontraumatic), unspecified site: Secondary | ICD-10-CM | POA: Diagnosis not present

## 2015-11-25 DIAGNOSIS — N393 Stress incontinence (female) (male): Secondary | ICD-10-CM | POA: Diagnosis not present

## 2015-11-25 DIAGNOSIS — M6281 Muscle weakness (generalized): Secondary | ICD-10-CM | POA: Diagnosis not present

## 2015-11-25 DIAGNOSIS — R3915 Urgency of urination: Secondary | ICD-10-CM | POA: Diagnosis not present

## 2015-11-25 DIAGNOSIS — R278 Other lack of coordination: Secondary | ICD-10-CM | POA: Diagnosis not present

## 2016-01-06 DIAGNOSIS — N393 Stress incontinence (female) (male): Secondary | ICD-10-CM | POA: Diagnosis not present

## 2016-01-06 DIAGNOSIS — M6200 Separation of muscle (nontraumatic), unspecified site: Secondary | ICD-10-CM | POA: Diagnosis not present

## 2016-01-06 DIAGNOSIS — M6281 Muscle weakness (generalized): Secondary | ICD-10-CM | POA: Diagnosis not present

## 2016-01-06 DIAGNOSIS — R278 Other lack of coordination: Secondary | ICD-10-CM | POA: Diagnosis not present

## 2016-01-10 ENCOUNTER — Other Ambulatory Visit: Payer: Self-pay | Admitting: Cardiology

## 2016-01-10 MED FILL — ROSUVASTATIN CALCIUM 20 MG: 20 | 90 days supply | Qty: 90 | Fill #0

## 2016-01-10 NOTE — Telephone Encounter (Signed)
Rx(s) sent to pharmacy electronically.  

## 2016-01-20 ENCOUNTER — Ambulatory Visit (INDEPENDENT_AMBULATORY_CARE_PROVIDER_SITE_OTHER): Payer: 59

## 2016-01-20 DIAGNOSIS — Z23 Encounter for immunization: Secondary | ICD-10-CM | POA: Diagnosis not present

## 2016-01-20 DIAGNOSIS — C61 Malignant neoplasm of prostate: Secondary | ICD-10-CM | POA: Diagnosis not present

## 2016-01-25 DIAGNOSIS — N393 Stress incontinence (female) (male): Secondary | ICD-10-CM | POA: Diagnosis not present

## 2016-01-25 DIAGNOSIS — N5201 Erectile dysfunction due to arterial insufficiency: Secondary | ICD-10-CM | POA: Diagnosis not present

## 2016-01-25 DIAGNOSIS — C61 Malignant neoplasm of prostate: Secondary | ICD-10-CM | POA: Diagnosis not present

## 2016-01-29 ENCOUNTER — Encounter: Payer: Self-pay | Admitting: Family Medicine

## 2016-02-01 MED FILL — CELECOXIB 200 MG CAPSULE: 200 | 30 days supply | Qty: 60 | Fill #1

## 2016-02-27 ENCOUNTER — Encounter: Payer: Self-pay | Admitting: Cardiology

## 2016-03-05 MED FILL — AMITRIPTYLINE HCL 25 MG TAB: 25 | 90 days supply | Qty: 180 | Fill #2

## 2016-03-08 NOTE — Progress Notes (Signed)
HPI The patient returns for follow up of CAD.  Since I last saw him he has had prostate surgery.  He has done well.  He is working full time at [employer].  He walks 9 miles per day and many stairs.  He does yard work. With this he denies any cardiovascular symptoms. The patient denies any new symptoms such as chest discomfort, neck or arm discomfort. There has been no new shortness of breath, PND or orthopnea. There have been no reported palpitations, presyncope or syncope.      No Known Allergies  Current Outpatient Prescriptions  Medication Sig Dispense Refill  . amitriptyline (ELAVIL) 25 MG tablet TAKE 1 TABLET BY MOUTH 2 TIMES A DAY FOR NEUROPATHIC PAIN 180 tablet 3  . aspirin 81 MG tablet Take 81 mg by mouth daily.    . celecoxib (CELEBREX) 200 MG capsule TAKE 1 CAPSULE (200 MG) BY MOUTH 2 TIMES A DAY AS NEEDED. 60 capsule 3  . nitroGLYCERIN (NITROSTAT) 0.4 MG SL tablet PLACE 1 TABLET UNDER THE TONGUE EVERY 5 MINUTES AS NEEDED FOR CHEST PAIN. 15 tablet 2  . rosuvastatin (CRESTOR) 20 MG tablet TAKE 1 TABLET (20 MG TOTAL) BY MOUTH DAILY. 90 tablet 1   No current facility-administered medications for this visit.     Past Medical History:  Diagnosis Date  . Arthritis   . 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 BPH (benign prostatic hyperplasia)   . Colon polyps 07/2011   hyperplastic 07/25/2011  . Coronary artery disease 12/22/07   chest pain, cardiac cath demontrated 95% stenosis in the proximal portion of the LAD.  There was a praximal stent with 10-20% restenosis.  The mid LAD had 70% stenosis.  The diagonal had 80-90% stenosis.  The circumflex had luminal irregularities.  Right coronary was free of disease.  He had an EF of 65%.  He subsequently had drug-eluting stent in the proximal mid LAD.   ETT 01/30/11 was NEGATIVE.  Marland Kitchen DDD (degenerative disc disease)    s/p surgery; L/S spine MRI 05/2004 showed DDD/spondylosis with right L3 nerve root abutment, with L5/S1 surgical changes.  .  Diverticulosis of sigmoid colon 07/25/11   Severe (endoscopy by Dr. Sharlett Iles)  . Fatty liver disease, nonalcoholic 06/5496   mild transaminasemia (Hep B and C testing neg)  . Gallbladder polyp 2013   Noted 06/2011.  Stable on u/s f/u 10/2011.  Marland Kitchen Hearing loss 2004   Dr. Tamala Julian, ENT  . Hyperlipidemia   . Prostate cancer [facility]) 12/2014   Robot assisted radical prostatectomy 07/2015: lymph node involvement.  As of 08/31/15 PSA undetectable: plan is active surveillance per urology (PSA  01/25/16 pending as of urologist's office note).  . Tinnitus     Past Surgical History:  Procedure Laterality Date  . APPENDECTOMY  1953  . BACK SURGERY     C-Spine and L-Spine  . CARDIAC CATHETERIZATION    . CARDIOVASCULAR STRESS TEST  2012   Normal  . COLONOSCOPY  02/26/02; 07/2011   Colonoscopy by Dr. Rowe Pavy 2003 was normal (+hx of polyps (adenomatous?) prior), 07/2011 showed one hyperplastic polyp.  . CORONARY STENT PLACEMENT     x 2 (LAD)  . LOBECTOMY     right upper lobectomy  . LYMPHADENECTOMY Bilateral 07/14/2015   Procedure: LYMPHADENECTOMY;  Surgeon: Raynelle Bring, MD;  Location: [facility];  Service: Urology;  Laterality: Bilateral;  . PROSTATE BIOPSY  04/27/15   4 of 12 bx core's + prostate adenocarcinoma (tentative treatment plan  is prostatectomy ? + other therapies (as of 05/18/15)  . ROBOT ASSISTED LAPAROSCOPIC RADICAL PROSTATECTOMY N/A 07/14/2015   One positive pelvic LN.  Procedure: XI ROBOTIC ASSISTED LAPAROSCOPIC RADICAL PROSTATECTOMY LEVEL 2;  Surgeon: Raynelle Bring, MD;  Location: [facility];  Service: Urology;  Laterality: N/A;  . UMBILICAL HERNIA REPAIR  07/14/2015   Procedure: HERNIA REPAIR UMBILICAL ADULT;  Surgeon: Raynelle Bring, MD;  Location: [facility];  Service: Urology;;    ROS:  ED  Otherwise as stated in the HPI and negative for all other systems.  PHYSICAL EXAM BP 132/84   Pulse 73   Ht 5' 9"  (1.753 m)   Wt 181 lb (82.1 kg)   BMI 26.73 kg/m  GENERAL:  Well appearing NECK:  No jugular  venous distention, waveform within normal limits, carotid upstroke brisk and symmetric, no bruits, no thyromegaly LUNGS:  Clear to auscultation bilaterally CHEST:  Thoracotomy scar    HEART:  PMI not displaced or sustained,S1 and S2 within normal limits, no S3, no S4, no clicks, no rubs, no murmurs ABD:  Flat, positive bowel sounds normal in frequency in pitch, no bruits, no rebound, no guarding, no midline pulsatile mass, no hepatomegaly, no splenomegaly EXT:  2 plus pulses throughout, no edema, no cyanosis no clubbing   EKG:  Sinus rhythm, rate 73, axis within normal limits, intervals within normal limits, T-wave inversion in lead V3 which is nonspecific minimal LVH by voltage criteria. 03/09/2016  ASSESSMEN the  AND PLAN  CAD - The patient has no new sypmtoms.  No further cardiovascular testing is indicated.  We will continue with aggressive risk reduction and meds as listed. He had a negative stress test 2012.   DYSLIPIDEMIA -  His LDL was 63 with an HDL of 37 in May. He will continue on the meds as listed.  HTN (hypertension) -  The blood pressure is at target. No change in medications is indicated. We will continue with therapeutic lifestyle changes (TLC).

## 2016-03-09 ENCOUNTER — Encounter: Payer: Self-pay | Admitting: Cardiology

## 2016-03-09 ENCOUNTER — Ambulatory Visit (INDEPENDENT_AMBULATORY_CARE_PROVIDER_SITE_OTHER): Payer: 59 | Admitting: Cardiology

## 2016-03-09 VITALS — BP 132/84 | HR 73 | Ht 69.0 in | Wt 181.0 lb

## 2016-03-09 DIAGNOSIS — I2510 Atherosclerotic heart disease of native coronary artery without angina pectoris: Secondary | ICD-10-CM

## 2016-03-09 NOTE — Patient Instructions (Signed)

## 2016-03-19 NOTE — Addendum Note (Signed)
Addended by: Leland Johns A on: 03/19/2016 04:33 PM   Modules accepted: Orders

## 2016-03-23 ENCOUNTER — Encounter: Payer: 59 | Admitting: Family Medicine

## 2016-04-02 ENCOUNTER — Encounter: Payer: 59 | Admitting: Family Medicine

## 2016-04-03 MED FILL — ROSUVASTATIN CALCIUM 20 MG: 20 | 90 days supply | Qty: 90 | Fill #1

## 2016-04-16 ENCOUNTER — Encounter: Payer: 59 | Admitting: Family Medicine

## 2016-04-16 DIAGNOSIS — L821 Other seborrheic keratosis: Secondary | ICD-10-CM | POA: Diagnosis not present

## 2016-04-16 DIAGNOSIS — Z23 Encounter for immunization: Secondary | ICD-10-CM | POA: Diagnosis not present

## 2016-04-16 DIAGNOSIS — D225 Melanocytic nevi of trunk: Secondary | ICD-10-CM | POA: Diagnosis not present

## 2016-04-16 DIAGNOSIS — L814 Other melanin hyperpigmentation: Secondary | ICD-10-CM | POA: Diagnosis not present

## 2016-04-16 DIAGNOSIS — D1800 Hemangioma unspecified site: Secondary | ICD-10-CM | POA: Diagnosis not present

## 2016-04-16 DIAGNOSIS — L570 Actinic keratosis: Secondary | ICD-10-CM | POA: Diagnosis not present

## 2016-04-27 ENCOUNTER — Ambulatory Visit (INDEPENDENT_AMBULATORY_CARE_PROVIDER_SITE_OTHER): Payer: 59 | Admitting: Family Medicine

## 2016-04-27 ENCOUNTER — Encounter: Payer: Self-pay | Admitting: Family Medicine

## 2016-04-27 VITALS — BP 144/88 | HR 62 | Temp 97.2°F | Resp 16 | Ht 68.0 in | Wt 183.2 lb

## 2016-04-27 DIAGNOSIS — E7800 Pure hypercholesterolemia, unspecified: Secondary | ICD-10-CM | POA: Diagnosis not present

## 2016-04-27 DIAGNOSIS — Z0000 Encounter for general adult medical examination without abnormal findings: Secondary | ICD-10-CM

## 2016-04-27 DIAGNOSIS — K7581 Nonalcoholic steatohepatitis (NASH): Secondary | ICD-10-CM

## 2016-04-27 DIAGNOSIS — R030 Elevated blood-pressure reading, without diagnosis of hypertension: Secondary | ICD-10-CM | POA: Diagnosis not present

## 2016-04-27 LAB — COMPREHENSIVE METABOLIC PANEL
ALK PHOS: 73 U/L (ref 39–117)
ALT: 70 U/L — AB (ref 0–53)
AST: 51 U/L — ABNORMAL HIGH (ref 0–37)
Albumin: 4.9 g/dL (ref 3.5–5.2)
BILIRUBIN TOTAL: 0.7 mg/dL (ref 0.2–1.2)
BUN: 11 mg/dL (ref 6–23)
CO2: 32 mEq/L (ref 19–32)
Calcium: 9.8 mg/dL (ref 8.4–10.5)
Chloride: 102 mEq/L (ref 96–112)
Creatinine, Ser: 1.12 mg/dL (ref 0.40–1.50)
GFR: 68.04 mL/min (ref >60.00)
GLUCOSE: 96 mg/dL (ref 70–99)
Potassium: 4.9 mEq/L (ref 3.5–5.1)
SODIUM: 142 meq/L (ref 135–145)
TOTAL PROTEIN: 7.6 g/dL (ref 6.0–8.3)

## 2016-04-27 LAB — LDL CHOLESTEROL, DIRECT: Direct LDL: 56 mg/dL

## 2016-04-27 LAB — LIPID PANEL
Cholesterol: 121 mg/dL (ref 0–200)
HDL: 39.1 mg/dL (ref >39.00)
NONHDL: 81.7
Total CHOL/HDL Ratio: 3
Triglycerides: 244 mg/dL — ABNORMAL HIGH (ref 0.0–149.0)
VLDL: 48.8 mg/dL — ABNORMAL HIGH (ref 0.0–40.0)

## 2016-04-27 NOTE — Progress Notes (Addendum)
Subjective:   William Montgomery is a 75 y.o. male who presents for an Initial Medicare Annual Wellness Visit.  The Patient was informed that the wellness visit is to identify future health risk and educate and initiate measures that can reduce risk for increased disease through the lifespan.   Describes health as fair, good or great? 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    Review of Systems  No ROS.  Medicare Wellness Visit.  Cardiac Risk Factors include: advanced age (>30mn, >>79women);dyslipidemia;family history of premature cardiovascular disease;male gender Sleep patterns: Sleeps about 8-9 hours, takes amitriptyline   Home Safety/Smoke Alarms:  Smoke detectors and security in place.  Living environment; residence and Firearm Safety: Lives with wife in 2 story home. No problem with stairs. Firearms locked away. [place]  Seat Belt Safety/Bike Helmet: Wears seat belt.    Counseling:   Eye Exam-Last 05/2014, followed yearly. Appointment scheduled.   Dental-Last 03/2016, every 6 months.   Male:   [facility]-Colonoscopy 07/25/2011, polyps. Recall 10 years.       PSA-08/19/15, <0.01. S/p Prostatectomy, Followed by [facility].     Objective:    Today's Vitals   04/27/16 1259 04/27/16 1320  BP: (!) 152/91 (!) 144/88  Pulse: 62   Resp: 16   Temp: 97.2 F (36.2 C)   TempSrc: Oral   SpO2: 98%   Weight: 183 lb 4 oz (83.1 kg)   Height: 5' 8"  (1.727 m)    Body mass index is 27.86 kg/m.  Current Medications (verified) Outpatient Encounter Prescriptions as of 04/27/2016  Medication Sig  . amitriptyline (ELAVIL) 25 MG tablet TAKE 1 TABLET BY MOUTH 2 TIMES A DAY FOR NEUROPATHIC PAIN  . aspirin 81 MG tablet Take 81 mg by mouth daily.  . celecoxib (CELEBREX) 200 MG capsule TAKE 1 CAPSULE (200 MG) BY MOUTH 2 TIMES A DAY AS NEEDED.  .Marland KitchennitroGLYCERIN (NITROSTAT) 0.4 MG SL tablet PLACE 1 TABLET UNDER THE TONGUE  EVERY 5 MINUTES AS NEEDED FOR CHEST PAIN.  . rosuvastatin (CRESTOR) 20 MG tablet TAKE 1 TABLET (20 MG TOTAL) BY MOUTH DAILY.   No facility-administered encounter medications on file as of 04/27/2016.     Allergies (verified) Patient has no known allergies.   History: Past Medical History:  Diagnosis Date  . Arthritis   . 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 BPH (benign prostatic hyperplasia)   . Colon polyps 07/2011   hyperplastic 07/25/2011  . Coronary artery disease 12/22/07   chest pain, cardiac cath demontrated 95% stenosis in the proximal portion of the LAD.  There was a praximal stent with 10-20% restenosis.  The mid LAD had 70% stenosis.  The diagonal had 80-90% stenosis.  The circumflex had luminal irregularities.  Right coronary was free of disease.  He had an EF of 65%.  He subsequently had drug-eluting stent in the proximal mid LAD.   ETT 01/30/11 was NEGATIVE.  .Marland KitchenDDD (degenerative disc disease)    s/p surgery; L/S spine MRI 05/2004 showed DDD/spondylosis with right L3 nerve root abutment, with L5/S1 surgical changes.  . Diverticulosis of sigmoid colon 07/25/11   Severe (endoscopy by Dr. PSharlett Iles  . Fatty liver disease, nonalcoholic 24/1638  mild transaminasemia (Hep B and C testing neg)  . Gallbladder polyp 2013   Noted 06/2011.  Stable on u/s f/u 10/2011.  .Marland KitchenHearing loss 2004   Dr. [facility]  .  Hyperlipidemia   . Prostate cancer [facility]) 12/2014   Robot assisted radical prostatectomy 07/2015: lymph node involvement.  Plan is active surveillance as per urology: As of 01/2016 PSA undetectable.  . Tinnitus    Past Surgical History:  Procedure Laterality Date  . APPENDECTOMY  1953  . BACK SURGERY     C-Spine and L-Spine  . CARDIAC CATHETERIZATION    . CARDIOVASCULAR STRESS TEST  2012   Normal  . COLONOSCOPY  02/26/02; 07/2011   Colonoscopy by Dr. Rowe Pavy 2003 was normal (+hx of polyps (adenomatous?) prior), 07/2011 showed one hyperplastic polyp.  . CORONARY STENT PLACEMENT      x 2 (LAD)  . LOBECTOMY     right upper lobectomy  . LYMPHADENECTOMY Bilateral 07/14/2015   Procedure: LYMPHADENECTOMY;  Surgeon: Raynelle Bring, MD;  Location: [facility];  Service: Urology;  Laterality: Bilateral;  . PROSTATE BIOPSY  04/27/15   4 of 12 bx core's + prostate adenocarcinoma (tentative treatment plan is prostatectomy ? + other therapies (as of 05/18/15)  . ROBOT ASSISTED LAPAROSCOPIC RADICAL PROSTATECTOMY N/A 07/14/2015   One positive pelvic LN.  Procedure: XI ROBOTIC ASSISTED LAPAROSCOPIC RADICAL PROSTATECTOMY LEVEL 2;  Surgeon: Raynelle Bring, MD;  Location: [facility];  Service: Urology;  Laterality: N/A;  . UMBILICAL HERNIA REPAIR  07/14/2015   Procedure: HERNIA REPAIR UMBILICAL ADULT;  Surgeon: Raynelle Bring, MD;  Location: [facility];  Service: Urology;;   Family History  Problem Relation Age of Onset  . Cervical cancer Mother     deceased  . Cirrhosis Father   . Colon cancer Neg Hx    Social History   Occupational History  . Retired    Social History Main Topics  . Smoking status: Former Smoker    Packs/day: 0.50    Years: 20.00    Types: Cigarettes    Quit date: 05/07/1976  . Smokeless tobacco: Former Systems developer    Types: Chew    Quit date: 05/07/1998     Comment: Scoal  . Alcohol use Yes     Comment: 1 beer every 6 months  . Drug use: No  . Sexual activity: Not on file   Tobacco Counseling Counseling given: Not Answered   Activities of Daily Living In your present state of health, do you have any difficulty performing the following activities: 04/27/2016 07/14/2015  Hearing? N Y  Vision? N N  Difficulty concentrating or making decisions? N N  Walking or climbing stairs? N N  Dressing or bathing? N N  Doing errands, shopping? N N  Preparing Food and eating ? N -  Using the Toilet? N -  In the past six months, have you accidently leaked urine? N -  Do you have problems with loss of bowel control? N -  Managing your Medications? N -  Managing your Finances? N -    Housekeeping or managing your Housekeeping? N -  Some recent data might be hidden    Immunizations and Health Maintenance Immunization History  Administered Date(s) Administered  . Influenza Split 02/18/2012  . Influenza Whole 02/04/2010, 02/09/2011  . Influenza, High Dose Seasonal PF 02/12/2014, 03/25/2015  . Influenza,inj,Quad PF,36+ Mos 01/30/2013, 01/20/2016  . Pneumococcal Conjugate-13 03/19/2014  . Pneumococcal Polysaccharide-23 05/08/2007  . Td 05/07/2006  . Zoster 10/31/2011   There are no preventive care reminders to display for this patient.  Patient Care Team: Tammi Sou, MD as PCP - General Sable Feil, MD as Consulting Physician (Gastroenterology) Franchot Gallo, MD as Consulting Physician (  Urology) Raynelle Bring, MD as Consulting Physician (Urology) Minus Breeding, MD as Consulting Physician (Cardiology)  Indicate any recent Medical Services you may have received from other than [facility] providers in the past year (date may be approximate).    Assessment:   This is a routine wellness examination for Hafiz. Physical assessment deferred to PCP.   Hearing/Vision screen Hearing Screening Comments: Bilateral hearing aids.  Vision Screening Comments: Reading glasses.   Dietary issues and exercise activities discussed: Current Exercise Habits: The patient has a physically strenous job, but has no regular exercise apart from work., Exercise limited by: None identified   Diet (meal preparation, eat out, water intake, caffeinated beverages, dairy products, fruits and vegetables): Drinks water and sweet tea, limited soda.   Breakfast: skips, coffee. Snacks on fruit.  Lunch: meat, 2 vegetables Dinner:  Often skips.     Patient out of town for work during the week. Encouraged to not skip meals and make healthy food choices. Discussed maintaining active lifestyle.   Goals      Patient Stated   . <enter goal here> (pt-stated)          Maintain current  health status by continuing to be active.       Depression Screen PHQ 2/9 Scores 04/27/2016 09/23/2015  PHQ - 2 Score 0 0    Fall Risk Fall Risk  04/27/2016 09/23/2015  Falls in the past year? Yes No  Number falls in past yr: 1 -  Injury with Fall? No -  Follow up Falls prevention discussed -    Cognitive Function:       Ad8 score reviewed for issues:  Issues making decisions:no  Less interest in hobbies / activities:no  Repeats questions, stories (family complaining):no  Trouble using ordinary gadgets (microwave, computer, phone):no  Forgets the month or year: no  Mismanaging finances: no  Remembering appts:no  Daily problems with thinking and/or memory:no Ad8 score is=0     Screening Tests Health Maintenance  Topic Date Due  . TETANUS/TDAP  05/07/2016  . COLONOSCOPY  07/24/2021  . INFLUENZA VACCINE  Completed  . ZOSTAVAX  Completed  . PNA vac Low Risk Adult  Completed        Plan:     Continue to eat heart healthy diet (full of fruits, vegetables, whole grains, lean protein, water--limit salt, fat, and sugar intake) and increase physical activity as tolerated.  Continue doing brain stimulating activities (puzzles, reading, adult coloring books, staying active) to keep memory sharp.   During the course of the visit [place] was educated and counseled about the following appropriate screening and preventive services:   Vaccines to include Pneumoccal, Influenza, Hepatitis B, Td, Zostavax, HCV  Colorectal cancer screening  Cardiovascular disease screening  Diabetes screening  Glaucoma screening  Nutrition counseling  Prostate cancer screening   Patient Instructions (the written plan) were given to the patient.   Gerilyn Nestle, RN   04/27/2016

## 2016-04-27 NOTE — Addendum Note (Signed)
Addended by: Gerilyn Nestle on: 04/27/2016 02:14 PM   Modules accepted: Miquel Dunn

## 2016-04-27 NOTE — Progress Notes (Signed)
Pre visit review using our clinic review tool, if applicable. No additional management support is needed unless otherwise documented below in the visit note. 

## 2016-04-27 NOTE — Progress Notes (Signed)
Reviewed AWV and agree.  Signed:  Crissie Sickles, MD           04/27/2016

## 2016-04-27 NOTE — Patient Instructions (Addendum)
Continue to eat heart healthy diet (full of fruits, vegetables, whole grains, lean protein, water--limit salt, fat, and sugar intake) and increase physical activity as tolerated.  Continue doing brain stimulating activities (puzzles, reading, adult coloring books, staying active) to keep memory sharp.    Fall Prevention in the Home Introduction Falls can cause injuries. They can happen to people of all ages. There are many things you can do to make your home safe and to help prevent falls. What can I do on the outside of my home?  Regularly fix the edges of walkways and driveways and fix any cracks.  Remove anything that might make you trip as you walk through a door, such as a raised step or threshold.  Trim any bushes or trees on the path to your home.  Use bright outdoor lighting.  Clear any walking paths of anything that might make someone trip, such as rocks or tools.  Regularly check to see if handrails are loose or broken. Make sure that both sides of any steps have handrails.  Any raised decks and porches should have guardrails on the edges.  Have any leaves, snow, or ice cleared regularly.  Use sand or salt on walking paths during winter.  Clean up any spills in your garage right away. This includes oil or grease spills. What can I do in the bathroom?  Use night lights.  Install grab bars by the toilet and in the tub and shower. Do not use towel bars as grab bars.  Use non-skid mats or decals in the tub or shower.  If you need to sit down in the shower, use a plastic, non-slip stool.  Keep the floor dry. Clean up any water that spills on the floor as soon as it happens.  Remove soap buildup in the tub or shower regularly.  Attach bath mats securely with double-sided non-slip rug tape.  Do not have throw rugs and other things on the floor that can make you trip. What can I do in the bedroom?  Use night lights.  Make sure that you have a light by your bed that  is easy to reach.  Do not use any sheets or blankets that are too big for your bed. They should not hang down onto the floor.  Have a firm chair that has side arms. You can use this for support while you get dressed.  Do not have throw rugs and other things on the floor that can make you trip. What can I do in the kitchen?  Clean up any spills right away.  Avoid walking on wet floors.  Keep items that you use a lot in easy-to-reach places.  If you need to reach something above you, use a strong step stool that has a grab bar.  Keep electrical cords out of the way.  Do not use floor polish or wax that makes floors slippery. If you must use wax, use non-skid floor wax.  Do not have throw rugs and other things on the floor that can make you trip. What can I do with my stairs?  Do not leave any items on the stairs.  Make sure that there are handrails on both sides of the stairs and use them. Fix handrails that are broken or loose. Make sure that handrails are as long as the stairways.  Check any carpeting to make sure that it is firmly attached to the stairs. Fix any carpet that is loose or worn.  Avoid having throw  rugs at the top or bottom of the stairs. If you do have throw rugs, attach them to the floor with carpet tape.  Make sure that you have a light switch at the top of the stairs and the bottom of the stairs. If you do not have them, ask someone to add them for you. What else can I do to help prevent falls?  Wear shoes that:  Do not have high heels.  Have rubber bottoms.  Are comfortable and fit you well.  Are closed at the toe. Do not wear sandals.  If you use a stepladder:  Make sure that it is fully opened. Do not climb a closed stepladder.  Make sure that both sides of the stepladder are locked into place.  Ask someone to hold it for you, if possible.  Clearly mark and make sure that you can see:  Any grab bars or handrails.  First and last  steps.  Where the edge of each step is.  Use tools that help you move around (mobility aids) if they are needed. These include:  Canes.  Walkers.  Scooters.  Crutches.  Turn on the lights when you go into a dark area. Replace any light bulbs as soon as they burn out.  Set up your furniture so you have a clear path. Avoid moving your furniture around.  If any of your floors are uneven, fix them.  If there are any pets around you, be aware of where they are.  Review your medicines with your doctor. Some medicines can make you feel dizzy. This can increase your chance of falling. Ask your doctor what other things that you can do to help prevent falls. This information is not intended to replace advice given to you by your health care provider. Make sure you discuss any questions you have with your health care provider. Document Released: 02/17/2009 Document Revised: 09/29/2015 Document Reviewed: 05/28/2014  2017 Elsevier  Health Maintenance, Male A healthy lifestyle and preventative care can promote health and wellness.  Maintain regular health, dental, and eye exams.  Eat a healthy diet. Foods like vegetables, fruits, whole grains, low-fat dairy products, and lean protein foods contain the nutrients you need and are low in calories. Decrease your intake of foods high in solid fats, added sugars, and salt. Get information about a proper diet from your health care provider, if necessary.  Regular physical exercise is one of the most important things you can do for your health. Most adults should get at least 150 minutes of moderate-intensity exercise (any activity that increases your heart rate and causes you to sweat) each week. In addition, most adults need muscle-strengthening exercises on 2 or more days a week.   Maintain a healthy weight. The body mass index (BMI) is a screening tool to identify possible weight problems. It provides an estimate of body fat based on height and  weight. Your health care provider can find your BMI and can help you achieve or maintain a healthy weight. For males 20 years and older:  A BMI below 18.5 is considered underweight.  A BMI of 18.5 to 24.9 is normal.  A BMI of 25 to 29.9 is considered overweight.  A BMI of 30 and above is considered obese.  Maintain normal blood lipids and cholesterol by exercising and minimizing your intake of saturated fat. Eat a balanced diet with plenty of fruits and vegetables. Blood tests for lipids and cholesterol should begin at age 18 and be repeated every  5 years. If your lipid or cholesterol levels are high, you are over age 6, or you are at high risk for heart disease, you may need your cholesterol levels checked more frequently.Ongoing high lipid and cholesterol levels should be treated with medicines if diet and exercise are not working.  If you smoke, find out from your health care provider how to quit. If you do not use tobacco, do not start.  Lung cancer screening is recommended for adults aged 81-80 years who are at high risk for developing lung cancer because of a history of smoking. A yearly low-dose CT scan of the lungs is recommended for people who have at least a 30-pack-year history of smoking and are current smokers or have quit within the past 15 years. A pack year of smoking is smoking an average of 1 pack of cigarettes a day for 1 year (for example, a 30-pack-year history of smoking could mean smoking 1 pack a day for 30 years or 2 packs a day for 15 years). Yearly screening should continue until the smoker has stopped smoking for at least 15 years. Yearly screening should be stopped for people who develop a health problem that would prevent them from having lung cancer treatment.  If you choose to drink alcohol, do not have more than 2 drinks per day. One drink is considered to be 12 oz (360 mL) of beer, 5 oz (150 mL) of wine, or 1.5 oz (45 mL) of liquor.  Avoid the use of street  drugs. Do not share needles with anyone. Ask for help if you need support or instructions about stopping the use of drugs.  High blood pressure causes heart disease and increases the risk of stroke. High blood pressure is more likely to develop in:  People who have blood pressure in the end of the normal range (100-139/85-89 mm Hg).  People who are overweight or obese.  People who are African American.  If you are 29-63 years of age, have your blood pressure checked every 3-5 years. If you are 67 years of age or older, have your blood pressure checked every year. You should have your blood pressure measured twice-once when you are at a hospital or clinic, and once when you are not at a hospital or clinic. Record the average of the two measurements. To check your blood pressure when you are not at a hospital or clinic, you can use:  An automated blood pressure machine at a pharmacy.  A home blood pressure monitor.  If you are 40-53 years old, ask your health care provider if you should take aspirin to prevent heart disease.  Diabetes screening involves taking a blood sample to check your fasting blood sugar level. This should be done once every 3 years after age 60 if you are at a normal weight and without risk factors for diabetes. Testing should be considered at a younger age or be carried out more frequently if you are overweight and have at least 1 risk factor for diabetes.  Colorectal cancer can be detected and often prevented. Most routine colorectal cancer screening begins at the age of 21 and continues through age 7. However, your health care provider may recommend screening at an earlier age if you have risk factors for colon cancer. On a yearly basis, your health care provider may provide home test kits to check for hidden blood in the stool. A small camera at the end of a tube may be used to directly examine the  colon (sigmoidoscopy or colonoscopy) to detect the earliest forms of  colorectal cancer. Talk to your health care provider about this at age 40 when routine screening begins. A direct exam of the colon should be repeated every 5-10 years through age 13, unless early forms of precancerous polyps or small growths are found.  People who are at an increased risk for hepatitis B should be screened for this virus. You are considered at high risk for hepatitis B if:  You were born in a country where hepatitis B occurs often. Talk with your health care provider about which countries are considered high risk.  Your parents were born in a high-risk country and you have not received a shot to protect against hepatitis B (hepatitis B vaccine).  You have HIV or AIDS.  You use needles to inject street drugs.  You live with, or have sex with, someone who has hepatitis B.  You are a man who has sex with other men (MSM).  You get hemodialysis treatment.  You take certain medicines for conditions like cancer, organ transplantation, and autoimmune conditions.  Hepatitis C blood testing is recommended for all people born from [birth year] through 1964 and any individual with known risk factors for hepatitis C.  Healthy men should no longer receive prostate-specific antigen (PSA) blood tests as part of routine cancer screening. Talk to your health care provider about prostate cancer screening.  Testicular cancer screening is not recommended for adolescents or adult males who have no symptoms. Screening includes self-exam, a health care provider exam, and other screening tests. Consult with your health care provider about any symptoms you have or any concerns you have about testicular cancer.  Practice safe sex. Use condoms and avoid high-risk sexual practices to reduce the spread of sexually transmitted infections (STIs).  You should be screened for STIs, including gonorrhea and chlamydia if:  You are sexually active and are younger than 24 years.  You are older than 24 years, and  your health care provider tells you that you are at risk for this type of infection.  Your sexual activity has changed since you were last screened, and you are at an increased risk for chlamydia or gonorrhea. Ask your health care provider if you are at risk.  If you are at risk of being infected with HIV, it is recommended that you take a prescription medicine daily to prevent HIV infection. This is called pre-exposure prophylaxis (PrEP). You are considered at risk if:  You are a man who has sex with other men (MSM).  You are a heterosexual man who is sexually active with multiple partners.  You take drugs by injection.  You are sexually active with a partner who has HIV.  Talk with your health care provider about whether you are at high risk of being infected with HIV. If you choose to begin PrEP, you should first be tested for HIV. You should then be tested every 3 months for as long as you are taking PrEP.  Use sunscreen. Apply sunscreen liberally and repeatedly throughout the day. You should seek shade when your shadow is shorter than you. Protect yourself by wearing long sleeves, pants, a wide-brimmed hat, and sunglasses year round whenever you are outdoors.  Tell your health care provider of new moles or changes in moles, especially if there is a change in shape or color. Also, tell your health care provider if a mole is larger than the size of a pencil eraser.  A one-time screening  for abdominal aortic aneurysm (AAA) and surgical repair of large AAAs by ultrasound is recommended for men aged 3-75 years who are current or former smokers.  Stay current with your vaccines (immunizations). This information is not intended to replace advice given to you by your health care provider. Make sure you discuss any questions you have with your health care provider. Document Released: 10/20/2007 Document Revised: 05/14/2014 Document Reviewed: 01/25/2015 Elsevier Interactive Patient Education   2017 [employer].

## 2016-04-27 NOTE — Progress Notes (Signed)
Office Note 04/27/2016  CC:  Chief Complaint  Patient presents with  . Annual Exam    Pt is fasting.     HPI:  William Montgomery is a 75 y.o. White male who is here for annual health maintenance exam. Dental visits biannually. Eye MD annually. No home bp checks since I last saw him.     Past Medical History:  Diagnosis Date  . Arthritis   . 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 BPH (benign prostatic hyperplasia)   . Colon polyps 07/2011   hyperplastic 07/25/2011  . Coronary artery disease 12/22/07   chest pain, cardiac cath demontrated 95% stenosis in the proximal portion of the LAD.  There was a praximal stent with 10-20% restenosis.  The mid LAD had 70% stenosis.  The diagonal had 80-90% stenosis.  The circumflex had luminal irregularities.  Right coronary was free of disease.  He had an EF of 65%.  He subsequently had drug-eluting stent in the proximal mid LAD.   ETT 01/30/11 was NEGATIVE.  Marland Kitchen DDD (degenerative disc disease)    s/p surgery; L/S spine MRI 05/2004 showed DDD/spondylosis with right L3 nerve root abutment, with L5/S1 surgical changes.  . Diverticulosis of sigmoid colon 07/25/11   Severe (endoscopy by Dr. Sharlett Iles)  . 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 Gallbladder polyp 2013   Noted 06/2011.  Stable on u/s f/u 10/2011.  Marland Kitchen Hearing loss 2004   Dr. Tamala Julian, ENT  . Hyperlipidemia   . Prostate cancer [facility]) 12/2014   Robot assisted radical prostatectomy 07/2015: lymph node involvement.  Plan is active surveillance as per urology: As of 01/2016 PSA undetectable.  . Tinnitus     Past Surgical History:  Procedure Laterality Date  . APPENDECTOMY  1953  . BACK SURGERY     C-Spine and L-Spine  . CARDIAC CATHETERIZATION    . CARDIOVASCULAR STRESS TEST  2012   Normal  . COLONOSCOPY  02/26/02; 07/2011   Colonoscopy by Dr. Rowe Pavy 2003 was normal (+hx of polyps (adenomatous?) prior), 07/2011 showed one hyperplastic  polyp.  . CORONARY STENT PLACEMENT     x 2 (LAD)  . LOBECTOMY     right upper lobectomy  . LYMPHADENECTOMY Bilateral 07/14/2015   Procedure: LYMPHADENECTOMY;  Surgeon: Raynelle Bring, MD;  Location: [facility];  Service: Urology;  Laterality: Bilateral;  . PROSTATE BIOPSY  04/27/15   4 of 12 bx core's + prostate adenocarcinoma (tentative treatment plan is prostatectomy ? + other therapies (as of 05/18/15)  . ROBOT ASSISTED LAPAROSCOPIC RADICAL PROSTATECTOMY N/A 07/14/2015   One positive pelvic LN.  Procedure: XI ROBOTIC ASSISTED LAPAROSCOPIC RADICAL PROSTATECTOMY LEVEL 2;  Surgeon: Raynelle Bring, MD;  [facility];  Service: Urology;  Laterality: N/A;  . UMBILICAL HERNIA REPAIR  07/14/2015   Procedure: HERNIA REPAIR UMBILICAL ADULT;  Surgeon: Raynelle Bring, MD;  [facility];  Service: Urology;;    Family History  Problem Relation Age of Onset  . Cervical cancer Mother     deceased  . Cirrhosis Father   . Colon cancer Neg Hx     Social History   Social History  . Marital status: Married    Spouse name: N/A  . Number of children: 2  . Years of education: N/A   Occupational History  . Retired    Social History Main Topics  . Smoking status: Former Smoker    Packs/day: 0.50  Years: 20.00    Types: Cigarettes    Quit date: 05/07/1976  . Smokeless tobacco: Former Systems developer    Types: Chew    Quit date: 05/07/1998     Comment: Scoal  . Alcohol use Yes     Comment: 1 beer every 6 months  . Drug use: No  . Sexual activity: Not on file   Other Topics Concern  . Not on file   Social History Narrative   Married, 2 adult children ([place]).   Lives in [place].     Occ: Retired from [employer] ([place] work) at age 21.   Tob (smoke and chew) x 20 yrs, quit approx 1980s.   Alcohol: very rare.   One cup coffee each morning.   Exercise: walks 3 miles about 4 times per week.   Active lifestyle.    Outpatient Medications Prior to Visit  Medication Sig Dispense Refill  .  amitriptyline (ELAVIL) 25 MG tablet TAKE 1 TABLET BY MOUTH 2 TIMES A DAY FOR NEUROPATHIC PAIN 180 tablet 3  . aspirin 81 MG tablet Take 81 mg by mouth daily.    . celecoxib (CELEBREX) 200 MG capsule TAKE 1 CAPSULE (200 MG) BY MOUTH 2 TIMES A DAY AS NEEDED. 60 capsule 3  . nitroGLYCERIN (NITROSTAT) 0.4 MG SL tablet PLACE 1 TABLET UNDER THE TONGUE EVERY 5 MINUTES AS NEEDED FOR CHEST PAIN. 15 tablet 2  . rosuvastatin (CRESTOR) 20 MG tablet TAKE 1 TABLET (20 MG TOTAL) BY MOUTH DAILY. 90 tablet 1   No facility-administered medications prior to visit.     No Known Allergies  ROS Review of Systems  Constitutional: Negative for appetite change, chills, fatigue and fever.  HENT: Negative for congestion, dental problem, ear pain and sore throat.   Eyes: Negative for discharge, redness and visual disturbance.  Respiratory: Negative for cough, chest tightness, shortness of breath and wheezing.   Cardiovascular: Negative for chest pain, palpitations and leg swelling.  Gastrointestinal: Negative for abdominal pain, blood in stool, diarrhea, nausea and vomiting.  Genitourinary: Negative for difficulty urinating, dysuria, flank pain, frequency, hematuria and urgency.  Musculoskeletal: Negative for arthralgias, back pain, joint swelling, myalgias and neck stiffness.  Skin: Negative for pallor and rash.  Neurological: Negative for dizziness, speech difficulty, weakness and headaches.  Hematological: Negative for adenopathy. Does not bruise/bleed easily.  Psychiatric/Behavioral: Negative for confusion and sleep disturbance. The patient is not nervous/anxious.     PE; Blood pressure (!) 144/88, pulse 62, temperature 97.2 F (36.2 C), temperature source Oral, resp. rate 16, height 5' 8"  (1.727 m), weight 183 lb 4 oz (83.1 kg), SpO2 98 %.  Repeat manual bp after exam was 144/88. Gen: Alert, well appearing.  Patient is oriented to person, place, time, and situation. AFFECT: pleasant, lucid thought and  speech. ENT: Ears: EACs clear, normal epithelium.  TMs with good light reflex and landmarks bilaterally.  Eyes: no injection, icteris, swelling, or exudate.  EOMI, PERRLA. Nose: no drainage or turbinate edema/swelling.  No injection or focal lesion.  Mouth: lips without lesion/swelling.  Oral mucosa pink and moist.  Dentition intact and without obvious caries or gingival swelling.  Oropharynx without erythema, exudate, or swelling.  Neck: supple/nontender.  No LAD, mass, or TM.  Carotid pulses 2+ bilaterally, without bruits. CV: RRR, no m/r/g.   LUNGS: CTA bilat, nonlabored resps, good aeration in all lung fields. ABD: soft, NT, ND, BS normal.  No hepatospenomegaly or mass.  No bruits. EXT: no clubbing, cyanosis, or  edema.  Musculoskeletal: no joint swelling, erythema, warmth, or tenderness.  ROM of all joints intact. Skin - no sores or suspicious lesions or rashes or color changes Rectal: deferred  Pertinent labs:  Lab Results  Component Value Date   TSH 3.192 03/18/2013   Lab Results  Component Value Date   WBC 6.6 09/23/2015   HGB 15.8 09/23/2015   HCT 47.2 09/23/2015   MCV 94.2 09/23/2015   PLT 199.0 09/23/2015   Lab Results  Component Value Date   CREATININE 0.99 07/11/2015   BUN 13 07/11/2015   NA 134 (L) 07/11/2015   K 4.9 07/11/2015   CL 102 07/11/2015   CO2 27 07/11/2015   Lab Results  Component Value Date   ALT 32 09/23/2015   AST 28 09/23/2015   ALKPHOS 80 03/19/2014   BILITOT 1.1 03/19/2014   Lab Results  Component Value Date   CHOL 142 09/23/2015   Lab Results  Component Value Date   HDL 37.10 (L) 09/23/2015   Lab Results  Component Value Date   LDLCALC 56 09/16/2013   Lab Results  Component Value Date   TRIG 216.0 (H) 09/23/2015   Lab Results  Component Value Date   CHOLHDL 4 09/23/2015   Lab Results  Component Value Date   PSA 5.69 (H) 12/24/2014   PSA 4.03 (H) 09/16/2014   PSA 3.03 03/18/2013   ASSESSMENT AND PLAN:   1) Health  maintenance exam:  Reviewed age and gender appropriate health maintenance issues (prudent diet, regular exercise, health risks of tobacco and excessive alcohol, use of seatbelts, fire alarms in home, use of sunscreen).  Also reviewed age and gender appropriate health screening as well as vaccine recommendations. Vaccines all UTD. Will check CMET and FLP today (hx of NASH and hyperlipidemia). Colon cancer screening: Normal colonoscopies 2003 and 2013; plan iFOB next year and as long as this is negative then next colonoscopy can be 2023. Prostate ca screening: not applicable --pt had prostate cancer, treatment etc.  Urology following.  2) Elevated bp w/out dx of HTN: pt will monitor bp daily at home and call in 2-3 wks if it is not consistently <140/90.  An After Visit Summary was printed and given to the patient.  FOLLOW UP:  Return in about 6 months (around 10/26/2016) for routine chronic illness f/u.  Signed:  Crissie Sickles, MD           04/27/2016

## 2016-05-01 ENCOUNTER — Encounter: Payer: Self-pay | Admitting: *Deleted

## 2016-05-28 MED FILL — AMITRIPTYLINE HCL 25 MG TAB: 25 | 90 days supply | Qty: 180 | Fill #3

## 2016-07-02 ENCOUNTER — Other Ambulatory Visit: Payer: Self-pay | Admitting: Cardiology

## 2016-07-02 MED FILL — ROSUVASTATIN CALCIUM 20 MG: 20 | 90 days supply | Qty: 90 | Fill #0

## 2016-07-20 DIAGNOSIS — C61 Malignant neoplasm of prostate: Secondary | ICD-10-CM | POA: Diagnosis not present

## 2016-07-20 LAB — PSA

## 2016-07-26 ENCOUNTER — Ambulatory Visit (INDEPENDENT_AMBULATORY_CARE_PROVIDER_SITE_OTHER): Payer: 59 | Admitting: Ophthalmology

## 2016-07-26 DIAGNOSIS — H2513 Age-related nuclear cataract, bilateral: Secondary | ICD-10-CM | POA: Diagnosis not present

## 2016-07-26 DIAGNOSIS — H353112 Nonexudative age-related macular degeneration, right eye, intermediate dry stage: Secondary | ICD-10-CM

## 2016-07-26 DIAGNOSIS — H43813 Vitreous degeneration, bilateral: Secondary | ICD-10-CM

## 2016-07-26 DIAGNOSIS — H353121 Nonexudative age-related macular degeneration, left eye, early dry stage: Secondary | ICD-10-CM | POA: Diagnosis not present

## 2016-07-27 DIAGNOSIS — N5201 Erectile dysfunction due to arterial insufficiency: Secondary | ICD-10-CM | POA: Diagnosis not present

## 2016-07-27 DIAGNOSIS — C61 Malignant neoplasm of prostate: Secondary | ICD-10-CM | POA: Diagnosis not present

## 2016-08-01 ENCOUNTER — Encounter: Payer: Self-pay | Admitting: Family Medicine

## 2016-08-13 ENCOUNTER — Other Ambulatory Visit: Payer: Self-pay | Admitting: Family Medicine

## 2016-08-13 NOTE — Telephone Encounter (Signed)
I rx'd a 3 mo supply with 3 RFs in Dec 2017.  He should not need RF on this med at this time.

## 2016-08-17 ENCOUNTER — Other Ambulatory Visit: Payer: Self-pay | Admitting: Family Medicine

## 2016-08-17 MED ORDER — AMITRIPTYLINE HCL 25 MG PO TABS: ORAL_TABLET | ORAL | 3 refills | Status: DC

## 2016-08-17 MED FILL — AMITRIPTYLINE HCL 25 MG TAB: 25 | 90 days supply | Qty: 180 | Fill #0

## 2016-08-17 NOTE — Telephone Encounter (Signed)
RF request for amitriptyline LOV: 04/27/16 Next ov: 10/26/16 Last written: 08/11/15 #180 w/ 3RF  Please advise. Thanks.

## 2016-08-17 NOTE — Telephone Encounter (Signed)
Patient walked in to office to request refill of amitriptyline (ELAVIL) 25 MG tablet.  Pharmacy:  [place], [place] - [place] [phone] (Phone) [phone] (Fax)

## 2016-10-02 MED FILL — ROSUVASTATIN CALCIUM 20 MG: 20 | 90 days supply | Qty: 90 | Fill #1

## 2016-10-26 ENCOUNTER — Encounter: Payer: Self-pay | Admitting: Family Medicine

## 2016-10-26 ENCOUNTER — Ambulatory Visit (INDEPENDENT_AMBULATORY_CARE_PROVIDER_SITE_OTHER): Payer: 59 | Admitting: Family Medicine

## 2016-10-26 VITALS — BP 128/78 | HR 61 | Temp 97.0°F | Resp 16 | Wt 179.0 lb

## 2016-10-26 DIAGNOSIS — K760 Fatty (change of) liver, not elsewhere classified: Secondary | ICD-10-CM

## 2016-10-26 DIAGNOSIS — E7800 Pure hypercholesterolemia, unspecified: Secondary | ICD-10-CM

## 2016-10-26 DIAGNOSIS — Z23 Encounter for immunization: Secondary | ICD-10-CM | POA: Diagnosis not present

## 2016-10-26 DIAGNOSIS — I2510 Atherosclerotic heart disease of native coronary artery without angina pectoris: Secondary | ICD-10-CM

## 2016-10-26 LAB — LIPID PANEL
Cholesterol: 102 mg/dL (ref 0–200)
HDL: 36.1 mg/dL — AB (ref >39.00)
LDL Cholesterol: 45 mg/dL (ref 0–99)
NonHDL: 66.2
TRIGLYCERIDES: 104 mg/dL (ref 0.0–149.0)
Total CHOL/HDL Ratio: 3
VLDL: 20.8 mg/dL (ref 0.0–40.0)

## 2016-10-26 LAB — COMPREHENSIVE METABOLIC PANEL
ALK PHOS: 60 U/L (ref 39–117)
ALT: 40 U/L (ref 0–53)
AST: 37 U/L (ref 0–37)
Albumin: 4.8 g/dL (ref 3.5–5.2)
BILIRUBIN TOTAL: 0.7 mg/dL (ref 0.2–1.2)
BUN: 19 mg/dL (ref 6–23)
CALCIUM: 10.1 mg/dL (ref 8.4–10.5)
CO2: 30 mEq/L (ref 19–32)
Chloride: 103 mEq/L (ref 96–112)
Creatinine, Ser: 1.05 mg/dL (ref 0.40–1.50)
GFR: 73.2 mL/min (ref >60.00)
Glucose, Bld: 106 mg/dL — ABNORMAL HIGH (ref 70–99)
POTASSIUM: 5 meq/L (ref 3.5–5.1)
Sodium: 141 mEq/L (ref 135–145)
TOTAL PROTEIN: 7.1 g/dL (ref 6.0–8.3)

## 2016-10-26 NOTE — Addendum Note (Signed)
Addended by: Gordy Councilman on: 10/26/2016 08:24 AM   Modules accepted: Orders

## 2016-10-26 NOTE — Progress Notes (Signed)
OFFICE VISIT  10/26/2016   CC:  Chief Complaint  William Montgomery presents with  . Follow-up    RCI   HPI:    William Montgomery is a 76 y.o. Caucasian male who presents for 6 mo f/u hyperlipidemia, hx of CAD s/p stents in remote past, NAFLD. Works 10 hours per day still.  Lots of activity at work, no formal exercise.  Taking rosuva 69m qd and has no side effects. He is trying to eat a low fat diet.  He stopped the amitriptyline b/c he was afraid of "brain" side effects.  ROS: no CP, no SOB, no dizziness, no HA's, no focal weakness, no fatigue, no myalgias/arthralgias.  Past Medical History:  Diagnosis Date  . Arthritis   . 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 BPH (benign prostatic hyperplasia)   . Colon polyps 07/2011   hyperplastic 07/25/2011  . Coronary artery disease 12/22/07   chest pain, cardiac cath demontrated 95% stenosis in the proximal portion of the LAD.  There was a praximal stent with 10-20% restenosis.  The mid LAD had 70% stenosis.  The diagonal had 80-90% stenosis.  The circumflex had luminal irregularities.  Right coronary was free of disease.  He had an EF of 65%.  He subsequently had drug-eluting stent in the proximal mid LAD.   ETT 01/30/11 was NEGATIVE.  .Marland KitchenDDD (degenerative disc disease)    s/p surgery; L/S spine MRI 05/2004 showed DDD/spondylosis with right L3 nerve root abutment, with L5/S1 surgical changes.  . Diverticulosis of sigmoid colon 07/25/11   Severe (endoscopy by Dr. PSharlett Iles  . Fatty liver disease, nonalcoholic 21/9417  mild transaminasemia (Hep B and C testing neg)  . Gallbladder polyp 2013   Noted 06/2011.  Stable on u/s f/u 10/2011.  .Marland KitchenHearing loss 2004   Dr. STamala Julian ENT  . Hyperlipidemia   . Prostate cancer ([facility] 12/2014   Robot assisted radical prostatectomy 07/2015: lymph node involvement.  Plan is active surveillance as per urology: As of 07/2016 PSA undetectable.  . Tinnitus     Past Surgical History:  Procedure Laterality Date  . APPENDECTOMY   1953  . BACK SURGERY     C-Spine and L-Spine  . CARDIAC CATHETERIZATION    . CARDIOVASCULAR STRESS TEST  2012   Normal  . COLONOSCOPY  02/26/02; 07/2011   Colonoscopy by Dr. ARowe Pavy2003 was normal (+hx of polyps (adenomatous?) prior), 07/2011 showed one hyperplastic polyp.  . CORONARY STENT PLACEMENT     x 2 (LAD)  . LOBECTOMY     right upper lobectomy  . LYMPHADENECTOMY Bilateral 07/14/2015   Procedure: LYMPHADENECTOMY;  Surgeon: LRaynelle Bring MD;  [facility];  Service: Urology;  Laterality: Bilateral;  . PROSTATE BIOPSY  04/27/15   4 of 12 bx core's + prostate adenocarcinoma (tentative treatment plan is prostatectomy ? + other therapies (as of 05/18/15)  . ROBOT ASSISTED LAPAROSCOPIC RADICAL PROSTATECTOMY N/A 07/14/2015   One positive pelvic LN.  Procedure: XI ROBOTIC ASSISTED LAPAROSCOPIC RADICAL PROSTATECTOMY LEVEL 2;  Surgeon: LRaynelle Bring MD;  [facility];  Service: Urology;  Laterality: N/A;  . UMBILICAL HERNIA REPAIR  07/14/2015   Procedure: HERNIA REPAIR UMBILICAL ADULT;  Surgeon: LRaynelle Bring MD;  [facility];  Service: Urology;;    Outpatient Medications Prior to Visit  Medication Sig Dispense Refill  . aspirin 81 MG tablet Take 81 mg by mouth daily.    . celecoxib (CELEBREX) 200 MG capsule TAKE 1 CAPSULE (200 MG) BY MOUTH  2 TIMES A DAY AS NEEDED. 60 capsule 3  . nitroGLYCERIN (NITROSTAT) 0.4 MG SL tablet PLACE 1 TABLET UNDER THE TONGUE EVERY 5 MINUTES AS NEEDED FOR CHEST PAIN. 15 tablet 2  . rosuvastatin (CRESTOR) 20 MG tablet TAKE 1 TABLET (20 MG TOTAL) BY MOUTH DAILY. 90 tablet 3  . amitriptyline (ELAVIL) 25 MG tablet TAKE 1 TABLET BY MOUTH 2 TIMES A DAY FOR NEUROPATHIC PAIN (William Montgomery not taking: Reported on 10/26/2016) 180 tablet 3   No facility-administered medications prior to visit.     No Known Allergies  ROS As per HPI  PE: Blood pressure 128/78, pulse 61, temperature 97 F (36.1 C), temperature source Oral, resp. rate 16, weight 179 lb (81.2 kg),  SpO2 96 %. Gen: Alert, well appearing.  William Montgomery is oriented to person, place, time, and situation. AFFECT: pleasant, lucid thought and speech. CV: RRR, no m/r/g.   LUNGS: CTA bilat, nonlabored resps, good aeration in all lung fields. EXT: no clubbing, cyanosis, or edema.    LABS:  Lab Results  Component Value Date   TSH 3.192 03/18/2013   Lab Results  Component Value Date   WBC 6.6 09/23/2015   HGB 15.8 09/23/2015   HCT 47.2 09/23/2015   MCV 94.2 09/23/2015   PLT 199.0 09/23/2015   Lab Results  Component Value Date   CREATININE 1.12 04/27/2016   BUN 11 04/27/2016   NA 142 04/27/2016   K 4.9 04/27/2016   CL 102 04/27/2016   CO2 32 04/27/2016   Lab Results  Component Value Date   ALT 70 (H) 04/27/2016   AST 51 (H) 04/27/2016   ALKPHOS 73 04/27/2016   BILITOT 0.7 04/27/2016   Lab Results  Component Value Date   CHOL 121 04/27/2016   Lab Results  Component Value Date   HDL 39.10 04/27/2016   Lab Results  Component Value Date   LDLCALC 56 09/16/2013   Lab Results  Component Value Date   TRIG 244.0 (H) 04/27/2016   Lab Results  Component Value Date   CHOLHDL 3 04/27/2016   Lab Results  Component Value Date   PSA <0.015 07/20/2016   PSA 5.69 (H) 12/24/2014   PSA 4.03 (H) 09/16/2014    IMPRESSION AND PLAN:  1) Hypercholesterolemia: tolerating statin.  Recheck FLP today + AST/ALT.  2) NAFLD: low fat diet, as much activity as possible, repeat hepatic panel today.  3) Hx of CAD: asymptomatic.  Continue aspirin and statin.  An After Visit Summary was printed and given to the William Montgomery.  FOLLOW UP: Return in about 6 months (around 04/27/2017) for annual CPE (fasting)+ AWV with Maudie Mercury.  Signed:  Crissie Sickles, MD           10/26/2016

## 2016-10-29 ENCOUNTER — Encounter: Payer: Self-pay | Admitting: *Deleted

## 2016-12-17 MED FILL — ROSUVASTATIN CALCIUM 20 MG: 20 | 90 days supply | Qty: 90 | Fill #2

## 2017-01-31 ENCOUNTER — Ambulatory Visit (INDEPENDENT_AMBULATORY_CARE_PROVIDER_SITE_OTHER): Payer: 59

## 2017-01-31 DIAGNOSIS — Z23 Encounter for immunization: Secondary | ICD-10-CM | POA: Diagnosis not present

## 2017-02-01 DIAGNOSIS — C61 Malignant neoplasm of prostate: Secondary | ICD-10-CM | POA: Diagnosis not present

## 2017-02-01 DIAGNOSIS — N5201 Erectile dysfunction due to arterial insufficiency: Secondary | ICD-10-CM | POA: Diagnosis not present

## 2017-02-01 DIAGNOSIS — N393 Stress incontinence (female) (male): Secondary | ICD-10-CM | POA: Diagnosis not present

## 2017-02-24 ENCOUNTER — Encounter: Payer: Self-pay | Admitting: Family Medicine

## 2017-03-04 MED FILL — ROSUVASTATIN CALCIUM 20 MG: 20 | 90 days supply | Qty: 90 | Fill #3

## 2017-03-14 NOTE — H&P (View-Only) (Signed)
HPI The patient returns for follow up of CAD.  Since I last saw him he has had decreased exercise tolerance.  This is happened slowly over weeks to months.  He used to be able to walk the length of his place of work and go up stairs and not be particularly short of breath but now he notices he is short of breath and wheezing.  But he is still active for his age but has trouble carrying things without getting symptoms.  More recently it has evolved to the point where he is starting to have discomfort occasionally up in his throat.  It does not seem to happen when he is vigorously exercising but he will notice it sometimes after doing activity at work and is a discomfort the last for 5-15 minutes.  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   It is moderate in intensity.  He is thought about taking nitroglycerin but he has not.  He is not having in the right arm discomfort that he was having.  He is not having any resting shortness of breath, PND or orthopnea.  However, given the neck discomfort that he is having is happening when he is at rest.  It is not describing any nausea vomiting.  He has noticed that his blood pressure is more elevated.   No Known Allergies  Current Outpatient Medications  Medication Sig Dispense Refill  . aspirin 81 MG tablet Take 81 mg by mouth daily.    . celecoxib (CELEBREX) 200 MG capsule TAKE 1 CAPSULE (200 MG) BY MOUTH 2 TIMES A DAY AS NEEDED. 60 capsule 3  . nitroGLYCERIN (NITROSTAT) 0.4 MG SL tablet PLACE 1 TABLET UNDER THE TONGUE EVERY 5 MINUTES AS NEEDED FOR CHEST PAIN. 25 tablet 2  . rosuvastatin (CRESTOR) 20 MG tablet TAKE 1 TABLET (20 MG TOTAL) BY MOUTH DAILY. 90 tablet 3  . lisinopril (PRINIVIL,ZESTRIL) 10 MG tablet Take 1 tablet (10 mg total) daily by mouth. 90 tablet 3   No current facility-administered medications for this visit.     Past Medical History:  Diagnosis Date  . Arthritis   . Blastomycosis 1977   Right  upper lobe  . BPH (benign prostatic hyperplasia)   . Colon polyps 07/2011   hyperplastic 07/25/2011  . Coronary artery disease 12/22/2007   chest pain, cardiac cath demontrated 95% stenosis in the proximal portion of the LAD.  There was a praximal stent with 10-20% restenosis.  The mid LAD had 70% stenosis.  The diagonal had 80-90% stenosis.  The circumflex had luminal irregularities.  Right coronary was free of disease.  He had an EF of 65%.  He subsequently had drug-eluting stent in the proximal mid LAD.   ETT 01/30/11 was NEGATIVE.  Marland Kitchen DDD (degenerative disc disease)    s/p surgery; L/S spine MRI 05/2004 showed DDD/spondylosis with right L3 nerve root abutment, with L5/S1 surgical changes.  . Diverticulosis of sigmoid colon 07/25/11   Severe (endoscopy by Dr. Sharlett Iles)  . Fatty liver disease, nonalcoholic 0/2637   mild transaminasemia (Hep B and C testing neg)  . Gallbladder polyp 2013   Noted 06/2011.  Stable on u/s f/u 10/2011.  Marland Kitchen Hearing loss 2004   Dr. Tamala Julian, ENT  . Hyperlipidemia   . Prostate cancer [facility]) 12/2014   Robot assisted radical prostatectomy 07/2015: lymph node involvement.  Plan is active surveillance as per urology: As of 02/01/17 PSA undetectable.  . Tinnitus  Past Surgical History:  Procedure Laterality Date  . APPENDECTOMY  1953  . BACK SURGERY     C-Spine and L-Spine  . CARDIAC CATHETERIZATION    . CARDIOVASCULAR STRESS TEST  2012   Normal  . COLONOSCOPY  02/26/02; 07/2011   Colonoscopy by Dr. Rowe Pavy 2003 was normal (+hx of polyps (adenomatous?) prior), 07/2011 showed one hyperplastic polyp.  . CORONARY STENT PLACEMENT     x 2 (LAD)  . LOBECTOMY     right upper lobectomy  . PROSTATE BIOPSY  04/27/15   4 of 12 bx core's + prostate adenocarcinoma (tentative treatment plan is prostatectomy ? + other therapies (as of 05/18/15)   Social History   Socioeconomic History  . Marital status: Married    Spouse name: None  . Number of children: 2  . Years of education:  None  . Highest education level: None  Social Needs  . Financial resource strain: None  . Food insecurity - worry: None  . Food insecurity - inability: None  . Transportation needs - medical: None  . Transportation needs - non-medical: None  Occupational History  . Occupation: Retired  Tobacco Use  . Smoking status: Former Smoker    Packs/day: 0.50    Years: 20.00    Pack years: 10.00    Types: Cigarettes    Last attempt to quit: 05/07/1976    Years since quitting: 40.8  . Smokeless tobacco: Former Systems developer    Types: Chew    Quit date: 05/07/1998  . Tobacco comment: Scoal  Substance and Sexual Activity  . Alcohol use: Yes    Comment: 1 beer every 6 months  . Drug use: No  . Sexual activity: None  Other Topics Concern  . None  Social History Narrative   Married, 2 adult children ([place]).   Lives in [place].     Occ: Retired from [employer] ([place] work) at age 49.   Tob (smoke and chew) x 20 yrs, quit approx 1980s.   Alcohol: very rare.   One cup coffee each morning.   Exercise: walks 3 miles about 4 times per week.   Active lifestyle.   Family History  Problem Relation Age of Onset  . Cervical cancer Mother        deceased  . Cirrhosis Father   . Colon cancer Neg Hx     ROS: As stated in the HPI and negative for all other systems.  PHYSICAL EXAM BP 130/80   Pulse (!) 52   Ht 5' 8"  (1.727 m)   Wt 185 lb 3.2 oz (84 kg)   SpO2 92%   BMI 28.16 kg/m   GENERAL:  Well appearing HEENT:  Pupils equal round and reactive, fundi not visualized, oral mucosa unremarkable NECK:  No jugular venous distention, waveform within normal limits, carotid upstroke brisk and symmetric, no bruits, no thyromegaly LYMPHATICS:  No cervical, inguinal adenopathy LUNGS:  Clear to auscultation bilaterally BACK:  No CVA tenderness CHEST:  Unremarkable HEART:  PMI not displaced or sustained,S1 and S2 within normal limits, no S3, no S4, no clicks, no rubs, no murmurs ABD:  Flat,  positive bowel sounds normal in frequency in pitch, no bruits, no rebound, no guarding, no midline pulsatile mass, no hepatomegaly, no splenomegaly EXT:  2 plus pulses throughout, no edema, no cyanosis no clubbing SKIN:  No rashes no nodules NEURO:  Cranial nerves II through XII grossly intact, motor grossly intact throughout PSYCH:  Cognitively intact, oriented to person  place and time    EKG:  Sinus rhythm, rate 52, axis within normal limits, intervals within normal limits.  No acute ST T wave changes.  03/15/2017   Lab Results  Component Value Date   CHOL 102 10/26/2016   TRIG 104.0 10/26/2016   HDL 36.10 (L) 10/26/2016   LDLCALC 45 10/26/2016   LDLDIRECT 56.0 04/27/2016    ASSESSMEN the  AND PLAN  CAD - The patient has symptoms consistent with unstable angina with exertional dyspnea has not progressed some resting discomfort.  Going to get new sublingual nitroglycerin.  His heart rate 1 tolerate beta-blockers.  He is instructed to go to the emergency room should he have any unstable symptoms and to avoid strenuous exercise until he gets his cardiac catheterization.  Cardiac catheterization is indicated. The patient understands that risks included but are not limited to stroke (1 in 1000), death (1 in 82), kidney failure [usually temporary] (1 in 500), bleeding (1 in 200), allergic reaction [possibly serious] (1 in 200).  The patient understands and agrees to proceed.   DYSLIPIDEMIA -  Lipids are as above.  He will continue with meds as listed.   HTN (hypertension) -  The blood pressure is e willlevated.  I will add lisinopril 10 mg daily.

## 2017-03-14 NOTE — Progress Notes (Signed)
HPI The patient returns for follow up of CAD.  Since I last saw him he has had decreased exercise tolerance.  This is happened slowly over weeks to months.  He used to be able to walk the length of his place of work and go up stairs and not be particularly short of breath but now he notices he is short of breath and wheezing.  But he is still active for his age but has trouble carrying things without getting symptoms.  More recently it has evolved to the point where he is starting to have discomfort occasionally up in his throat.  It does not seem to happen when he is vigorously exercising but he will notice it sometimes after doing activity at work and is a discomfort the last for 5-15 minutes.  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   It is moderate in intensity.  He is thought about taking nitroglycerin but he has not.  He is not having in the right arm discomfort that he was having.  He is not having any resting shortness of breath, PND or orthopnea.  However, given the neck discomfort that he is having is happening when he is at rest.  It is not describing any nausea vomiting.  He has noticed that his blood pressure is more elevated.   No Known Allergies  Current Outpatient Medications  Medication Sig Dispense Refill  . aspirin 81 MG tablet Take 81 mg by mouth daily.    . celecoxib (CELEBREX) 200 MG capsule TAKE 1 CAPSULE (200 MG) BY MOUTH 2 TIMES A DAY AS NEEDED. 60 capsule 3  . nitroGLYCERIN (NITROSTAT) 0.4 MG SL tablet PLACE 1 TABLET UNDER THE TONGUE EVERY 5 MINUTES AS NEEDED FOR CHEST PAIN. 25 tablet 2  . rosuvastatin (CRESTOR) 20 MG tablet TAKE 1 TABLET (20 MG TOTAL) BY MOUTH DAILY. 90 tablet 3  . lisinopril (PRINIVIL,ZESTRIL) 10 MG tablet Take 1 tablet (10 mg total) daily by mouth. 90 tablet 3   No current facility-administered medications for this visit.     Past Medical History:  Diagnosis Date  . Arthritis   . Blastomycosis 1977   Right  upper lobe  . BPH (benign prostatic hyperplasia)   . Colon polyps 07/2011   hyperplastic 07/25/2011  . Coronary artery disease 12/22/2007   chest pain, cardiac cath demontrated 95% stenosis in the proximal portion of the LAD.  There was a praximal stent with 10-20% restenosis.  The mid LAD had 70% stenosis.  The diagonal had 80-90% stenosis.  The circumflex had luminal irregularities.  Right coronary was free of disease.  He had an EF of 65%.  He subsequently had drug-eluting stent in the proximal mid LAD.   ETT 01/30/11 was NEGATIVE.  Marland Kitchen DDD (degenerative disc disease)    s/p surgery; L/S spine MRI 05/2004 showed DDD/spondylosis with right L3 nerve root abutment, with L5/S1 surgical changes.  . Diverticulosis of sigmoid colon 07/25/11   Severe (endoscopy by Dr. Sharlett Iles)  . Fatty liver disease, nonalcoholic 11/9890   mild transaminasemia (Hep B and C testing neg)  . Gallbladder polyp 2013   Noted 06/2011.  Stable on u/s f/u 10/2011.  Marland Kitchen Hearing loss 2004   Dr. Tamala Julian, ENT  . Hyperlipidemia   . Prostate cancer [facility]) 12/2014   Robot assisted radical prostatectomy 07/2015: lymph node involvement.  Plan is active surveillance as per urology: As of 02/01/17 PSA undetectable.  . Tinnitus  Past Surgical History:  Procedure Laterality Date  . APPENDECTOMY  1953  . BACK SURGERY     C-Spine and L-Spine  . CARDIAC CATHETERIZATION    . CARDIOVASCULAR STRESS TEST  2012   Normal  . COLONOSCOPY  02/26/02; 07/2011   Colonoscopy by Dr. Rowe Pavy 2003 was normal (+hx of polyps (adenomatous?) prior), 07/2011 showed one hyperplastic polyp.  . CORONARY STENT PLACEMENT     x 2 (LAD)  . LOBECTOMY     right upper lobectomy  . PROSTATE BIOPSY  04/27/15   4 of 12 bx core's + prostate adenocarcinoma (tentative treatment plan is prostatectomy ? + other therapies (as of 05/18/15)   Social History   Socioeconomic History  . Marital status: Married    Spouse name: None  . Number of children: 2  . Years of education:  None  . Highest education level: None  Social Needs  . Financial resource strain: None  . Food insecurity - worry: None  . Food insecurity - inability: None  . Transportation needs - medical: None  . Transportation needs - non-medical: None  Occupational History  . Occupation: Retired  Tobacco Use  . Smoking status: Former Smoker    Packs/day: 0.50    Years: 20.00    Pack years: 10.00    Types: Cigarettes    Last attempt to quit: 05/07/1976    Years since quitting: 40.8  . Smokeless tobacco: Former Systems developer    Types: Chew    Quit date: 05/07/1998  . Tobacco comment: Scoal  Substance and Sexual Activity  . Alcohol use: Yes    Comment: 1 beer every 6 months  . Drug use: No  . Sexual activity: None  Other Topics Concern  . None  Social History Narrative   Married, 2 adult children ([place]).   Lives in [place].     Occ: Retired from [employer] ([place] work) at age 47.   Tob (smoke and chew) x 20 yrs, quit approx 1980s.   Alcohol: very rare.   One cup coffee each morning.   Exercise: walks 3 miles about 4 times per week.   Active lifestyle.   Family History  Problem Relation Age of Onset  . Cervical cancer Mother        deceased  . Cirrhosis Father   . Colon cancer Neg Hx     ROS: As stated in the HPI and negative for all other systems.  PHYSICAL EXAM BP 130/80   Pulse (!) 52   Ht 5' 8"  (1.727 m)   Wt 185 lb 3.2 oz (84 kg)   SpO2 92%   BMI 28.16 kg/m   GENERAL:  Well appearing HEENT:  Pupils equal round and reactive, fundi not visualized, oral mucosa unremarkable NECK:  No jugular venous distention, waveform within normal limits, carotid upstroke brisk and symmetric, no bruits, no thyromegaly LYMPHATICS:  No cervical, inguinal adenopathy LUNGS:  Clear to auscultation bilaterally BACK:  No CVA tenderness CHEST:  Unremarkable HEART:  PMI not displaced or sustained,S1 and S2 within normal limits, no S3, no S4, no clicks, no rubs, no murmurs ABD:  Flat,  positive bowel sounds normal in frequency in pitch, no bruits, no rebound, no guarding, no midline pulsatile mass, no hepatomegaly, no splenomegaly EXT:  2 plus pulses throughout, no edema, no cyanosis no clubbing SKIN:  No rashes no nodules NEURO:  Cranial nerves II through XII grossly intact, motor grossly intact throughout PSYCH:  Cognitively intact, oriented to person  place and time    EKG:  Sinus rhythm, rate 52, axis within normal limits, intervals within normal limits.  No acute ST T wave changes.  03/15/2017   Lab Results  Component Value Date   CHOL 102 10/26/2016   TRIG 104.0 10/26/2016   HDL 36.10 (L) 10/26/2016   LDLCALC 45 10/26/2016   LDLDIRECT 56.0 04/27/2016    ASSESSMEN the  AND PLAN  CAD - The patient has symptoms consistent with unstable angina with exertional dyspnea has not progressed some resting discomfort.  Going to get new sublingual nitroglycerin.  His heart rate 1 tolerate beta-blockers.  He is instructed to go to the emergency room should he have any unstable symptoms and to avoid strenuous exercise until he gets his cardiac catheterization.  Cardiac catheterization is indicated. The patient understands that risks included but are not limited to stroke (1 in 1000), death (1 in 72), kidney failure [usually temporary] (1 in 500), bleeding (1 in 200), allergic reaction [possibly serious] (1 in 200).  The patient understands and agrees to proceed.   DYSLIPIDEMIA -  Lipids are as above.  He will continue with meds as listed.   HTN (hypertension) -  The blood pressure is e willlevated.  I will add lisinopril 10 mg daily.

## 2017-03-15 ENCOUNTER — Ambulatory Visit (INDEPENDENT_AMBULATORY_CARE_PROVIDER_SITE_OTHER): Payer: 59 | Admitting: Cardiology

## 2017-03-15 ENCOUNTER — Encounter: Payer: Self-pay | Admitting: Cardiology

## 2017-03-15 VITALS — BP 130/80 | HR 52 | Ht 68.0 in | Wt 185.2 lb

## 2017-03-15 DIAGNOSIS — R5383 Other fatigue: Secondary | ICD-10-CM | POA: Diagnosis not present

## 2017-03-15 DIAGNOSIS — D689 Coagulation defect, unspecified: Secondary | ICD-10-CM | POA: Diagnosis not present

## 2017-03-15 DIAGNOSIS — E785 Hyperlipidemia, unspecified: Secondary | ICD-10-CM

## 2017-03-15 DIAGNOSIS — I200 Unstable angina: Secondary | ICD-10-CM | POA: Diagnosis not present

## 2017-03-15 DIAGNOSIS — I10 Essential (primary) hypertension: Secondary | ICD-10-CM

## 2017-03-15 DIAGNOSIS — Z01812 Encounter for preprocedural laboratory examination: Secondary | ICD-10-CM

## 2017-03-15 LAB — BASIC METABOLIC PANEL
BUN/Creatinine Ratio: 13 (ref 10–24)
BUN: 13 mg/dL (ref 8–27)
CALCIUM: 9.7 mg/dL (ref 8.6–10.2)
CHLORIDE: 99 mmol/L (ref 96–106)
CO2: 25 mmol/L (ref 20–29)
Creatinine, Ser: 0.98 mg/dL (ref 0.76–1.27)
GFR calc Af Amer: 87 mL/min/{1.73_m2} (ref >59)
GFR calc non Af Amer: 75 mL/min/{1.73_m2} (ref >59)
GLUCOSE: 94 mg/dL (ref 65–99)
POTASSIUM: 4.8 mmol/L (ref 3.5–5.2)
SODIUM: 139 mmol/L (ref 134–144)

## 2017-03-15 LAB — CBC
Hematocrit: 46.9 % (ref 37.5–51.0)
Hemoglobin: 16.2 g/dL (ref 13.0–17.7)
MCH: 32.7 pg (ref 26.6–33.0)
MCHC: 34.5 g/dL (ref 31.5–35.7)
MCV: 95 fL (ref 79–97)
PLATELETS: 217 10*3/uL (ref 150–379)
RBC: 4.95 x10E6/uL (ref 4.14–5.80)
RDW: 14 % (ref 12.3–15.4)
WBC: 4.8 10*3/uL (ref 3.4–10.8)

## 2017-03-15 LAB — APTT: aPTT: 29 s (ref 24–33)

## 2017-03-15 LAB — PROTIME-INR
INR: 1.1 (ref 0.8–1.2)
Prothrombin Time: 11.2 s (ref 9.1–12.0)

## 2017-03-15 LAB — TSH: TSH: 3.08 u[IU]/mL (ref 0.450–4.500)

## 2017-03-15 MED ORDER — LISINOPRIL 10 MG PO TABS: 10.0000 mg | ORAL_TABLET | Freq: Every day | ORAL | 3 refills | Status: DC

## 2017-03-15 MED ORDER — NITROGLYCERIN 0.4 MG SL SUBL: SUBLINGUAL_TABLET | SUBLINGUAL | 2 refills | Status: DC

## 2017-03-15 MED FILL — LISINOPRIL 10 MG TABLET: 10 | 90 days supply | Qty: 90 | Fill #0

## 2017-03-15 MED FILL — NITROGLYCERIN 0.4 MG TAB SL: 0.4 | 25 days supply | Qty: 25 | Fill #0

## 2017-03-15 NOTE — Patient Instructions (Signed)
Medication Instructions:  STAR-Lisinopril 10 mg daily  If you need a refill on your cardiac medications before your next appointment, please call your pharmacy.  Labwork: Pre Op Labs Today HERE IN OUR OFFICE AT [facility]  Testing/Procedures:   [facility] [address] [place] [address] Dept: [phone] Loc: [place]  03/15/2017  You are scheduled for a Cardiac Catheterization on Thursday, November 15 with Dr. Harrell Gave End.  1. Please arrive at the [facility] (Main Entrance A) at [facility]: [address], [place] [address] at 8:30 AM (two hours before your procedure to ensure your preparation). Free valet parking service is available.   Special note: Every effort is made to have your procedure done on time. Please understand that emergencies sometimes delay scheduled procedures.  2. Diet: Do not eat or drink anything after midnight prior to your procedure except sips of water to take medications.  3. Labs: Today  4. Medication instructions in preparation for your procedure:    Current Outpatient Medications (Cardiovascular):  .  nitroGLYCERIN (NITROSTAT) 0.4 MG SL tablet, PLACE 1 TABLET UNDER THE TONGUE EVERY 5 MINUTES AS NEEDED FOR CHEST PAIN. .  rosuvastatin (CRESTOR) 20 MG tablet, TAKE 1 TABLET (20 MG TOTAL) BY MOUTH DAILY.   Current Outpatient Medications (Analgesics):  .  aspirin 81 MG tablet, Take 81 mg by mouth daily. .  celecoxib (CELEBREX) 200 MG capsule, TAKE 1 CAPSULE (200 MG) BY MOUTH 2 TIMES A DAY AS NEEDED.   *For reference purposes while preparing patient instructions.   Delete this med list prior to printing instructions for patient.*   On the morning of your procedure, take your Aspirin and all other morning medication with a sip of water.  5. Plan for one night stay--bring personal belongings. 6. Bring a current list  of your medications and current insurance cards. 7. You MUST have a responsible person to drive you home. 8. Someone MUST be with you the first 24 hours after you arrive home or your discharge will be delayed. 9. Please wear clothes that are easy to get on and off and wear slip-on shoes.  Thank you for allowing [place] to care for you!   -- [facility]    Thank you for choosing [facility] at [facility]!!

## 2017-03-19 ENCOUNTER — Telehealth: Payer: Self-pay

## 2017-03-19 NOTE — Telephone Encounter (Signed)
Left generic message at Pt home phone # requesting call back for cath instruction.  Call went to VM for cell phone also.    Await call back.

## 2017-03-21 ENCOUNTER — Encounter (HOSPITAL_COMMUNITY): Payer: Self-pay | Admitting: Internal Medicine

## 2017-03-21 ENCOUNTER — Ambulatory Visit (HOSPITAL_COMMUNITY)
Admission: RE | Admit: 2017-03-21 | Discharge: 2017-03-21 | Disposition: A | Discharge status: Home / Self Care | Payer: 59 | Source: Ambulatory Visit | Attending: Internal Medicine | Admitting: Internal Medicine

## 2017-03-21 ENCOUNTER — Encounter (HOSPITAL_COMMUNITY)
Admission: RE | Disposition: A | Discharge status: Home / Self Care | Payer: Self-pay | Source: Ambulatory Visit | Attending: Internal Medicine

## 2017-03-21 DIAGNOSIS — Z955 Presence of coronary angioplasty implant and graft: Secondary | ICD-10-CM | POA: Insufficient documentation

## 2017-03-21 DIAGNOSIS — Z7982 Long term (current) use of aspirin: Secondary | ICD-10-CM | POA: Diagnosis not present

## 2017-03-21 DIAGNOSIS — I25110 Atherosclerotic heart disease of native coronary artery with unstable angina pectoris: Secondary | ICD-10-CM | POA: Diagnosis not present

## 2017-03-21 DIAGNOSIS — Z87891 Personal history of nicotine dependence: Secondary | ICD-10-CM | POA: Diagnosis not present

## 2017-03-21 DIAGNOSIS — I200 Unstable angina: Secondary | ICD-10-CM

## 2017-03-21 DIAGNOSIS — K760 Fatty (change of) liver, not elsewhere classified: Secondary | ICD-10-CM | POA: Insufficient documentation

## 2017-03-21 DIAGNOSIS — E785 Hyperlipidemia, unspecified: Secondary | ICD-10-CM | POA: Diagnosis not present

## 2017-03-21 HISTORY — PX: LEFT HEART CATH AND CORONARY ANGIOGRAPHY: CATH118249

## 2017-03-21 SURGERY — LEFT HEART CATH AND CORONARY ANGIOGRAPHY
Anesthesia: LOCAL

## 2017-03-21 MED ORDER — SODIUM CHLORIDE 0.9% FLUSH: 3.0000 mL | Freq: Two times a day (BID) | INTRAVENOUS | Status: DC

## 2017-03-21 MED ORDER — SODIUM CHLORIDE 0.9 % IV SOLN: INTRAVENOUS | Status: AC

## 2017-03-21 MED ORDER — HEPARIN SODIUM (PORCINE) 1000 UNIT/ML IJ SOLN
INTRAMUSCULAR | Status: AC
  Filled 2017-03-21: qty 1

## 2017-03-21 MED ORDER — ACETAMINOPHEN 325 MG PO TABS
650.0000 mg | ORAL_TABLET | Freq: Four times a day (QID) | ORAL | Status: DC | PRN
  Administered 2017-03-21: 650 mg via ORAL

## 2017-03-21 MED ORDER — OXYCODONE-ACETAMINOPHEN 5-325 MG PO TABS
1.0000 | ORAL_TABLET | Freq: Once | ORAL | Status: AC
  Administered 2017-03-21: 1 via ORAL

## 2017-03-21 MED ORDER — HEPARIN SODIUM (PORCINE) 1000 UNIT/ML IJ SOLN
INTRAMUSCULAR | Status: DC | PRN
  Administered 2017-03-21: 4000 [IU] via INTRAVENOUS

## 2017-03-21 MED ORDER — OXYCODONE HCL 5 MG PO TABS
ORAL_TABLET | ORAL | Status: AC
  Filled 2017-03-21: qty 1

## 2017-03-21 MED ORDER — ONDANSETRON HCL 4 MG/2ML IJ SOLN
INTRAMUSCULAR | Status: AC
  Administered 2017-03-21: 4 mg via INTRAVENOUS
  Filled 2017-03-21: qty 2

## 2017-03-21 MED ORDER — SODIUM CHLORIDE 0.9 % IV SOLN: 250.0000 mL | INTRAVENOUS | Status: DC | PRN

## 2017-03-21 MED ORDER — LIDOCAINE HCL (PF) 1 % IJ SOLN
INTRAMUSCULAR | Status: DC | PRN
  Administered 2017-03-21: 2 mL

## 2017-03-21 MED ORDER — ISOSORBIDE MONONITRATE ER 30 MG PO TB24: 30.0000 mg | ORAL_TABLET | Freq: Every day | ORAL | 11 refills | Status: DC

## 2017-03-21 MED ORDER — ASPIRIN 81 MG PO CHEW
81.0000 mg | CHEWABLE_TABLET | ORAL | Status: AC
  Administered 2017-03-21: 81 mg via ORAL

## 2017-03-21 MED ORDER — MIDAZOLAM HCL 2 MG/2ML IJ SOLN
INTRAMUSCULAR | Status: AC
  Filled 2017-03-21: qty 2

## 2017-03-21 MED ORDER — HEPARIN (PORCINE) IN NACL 2-0.9 UNIT/ML-% IJ SOLN
INTRAMUSCULAR | Status: AC
  Filled 2017-03-21: qty 1000

## 2017-03-21 MED ORDER — VERAPAMIL HCL 2.5 MG/ML IV SOLN
INTRAVENOUS | Status: AC
  Filled 2017-03-21: qty 2

## 2017-03-21 MED ORDER — ASPIRIN 81 MG PO CHEW
CHEWABLE_TABLET | ORAL | Status: AC
Start: 2017-03-21 — End: 2017-03-21
  Administered 2017-03-21: 81 mg via ORAL
  Filled 2017-03-21: qty 1

## 2017-03-21 MED ORDER — HEPARIN (PORCINE) IN NACL 2-0.9 UNIT/ML-% IJ SOLN
INTRAMUSCULAR | Status: AC | PRN
  Administered 2017-03-21: 1000 mL

## 2017-03-21 MED ORDER — SODIUM CHLORIDE 0.9 % WEIGHT BASED INFUSION
3.0000 mL/kg/h | INTRAVENOUS | Status: AC
  Administered 2017-03-21: 3 mL/kg/h via INTRAVENOUS

## 2017-03-21 MED ORDER — IOPAMIDOL (ISOVUE-370) INJECTION 76%
INTRAVENOUS | Status: DC | PRN
  Administered 2017-03-21: 55 mL via INTRA_ARTERIAL

## 2017-03-21 MED ORDER — FENTANYL CITRATE (PF) 100 MCG/2ML IJ SOLN
INTRAMUSCULAR | Status: DC | PRN
  Administered 2017-03-21: 50 ug via INTRAVENOUS

## 2017-03-21 MED ORDER — LIDOCAINE HCL (PF) 1 % IJ SOLN
INTRAMUSCULAR | Status: AC
  Filled 2017-03-21: qty 30

## 2017-03-21 MED ORDER — MIDAZOLAM HCL 2 MG/2ML IJ SOLN
INTRAMUSCULAR | Status: DC | PRN
  Administered 2017-03-21: 1 mg via INTRAVENOUS

## 2017-03-21 MED ORDER — SODIUM CHLORIDE 0.9% FLUSH: 3.0000 mL | INTRAVENOUS | Status: DC | PRN

## 2017-03-21 MED ORDER — SODIUM CHLORIDE 0.9 % WEIGHT BASED INFUSION: 1.0000 mL/kg/h | INTRAVENOUS | Status: DC

## 2017-03-21 MED ORDER — IOPAMIDOL (ISOVUE-370) INJECTION 76%
INTRAVENOUS | Status: AC
  Filled 2017-03-21: qty 100

## 2017-03-21 MED ORDER — ONDANSETRON HCL 4 MG/2ML IJ SOLN
4.0000 mg | Freq: Once | INTRAMUSCULAR | Status: AC
  Administered 2017-03-21: 4 mg via INTRAVENOUS

## 2017-03-21 MED ORDER — VERAPAMIL HCL 2.5 MG/ML IV SOLN
INTRAVENOUS | Status: DC | PRN
  Administered 2017-03-21: 10 mL via INTRA_ARTERIAL

## 2017-03-21 MED ORDER — FENTANYL CITRATE (PF) 100 MCG/2ML IJ SOLN
INTRAMUSCULAR | Status: AC
  Filled 2017-03-21: qty 2

## 2017-03-21 MED ORDER — OXYCODONE-ACETAMINOPHEN 5-325 MG PO TABS
ORAL_TABLET | ORAL | Status: AC
  Administered 2017-03-21: 1 via ORAL
  Filled 2017-03-21: qty 1

## 2017-03-21 MED ORDER — SODIUM CHLORIDE 0.9% FLUSH
3.0000 mL | INTRAVENOUS | Status: DC | PRN
  Administered 2017-03-21: 3 mL via INTRAVENOUS
  Filled 2017-03-21: qty 3

## 2017-03-21 MED FILL — ISOSORBIDE MN ER 30 MG TAB: 30 | 30 days supply | Qty: 30 | Fill #0

## 2017-03-21 SURGICAL SUPPLY — 10 items
CATH 5FR JL3.5 JR4 ANG PIG MP (CATHETERS) ×2 IMPLANT
DEVICE RAD COMP TR BAND LRG (VASCULAR PRODUCTS) ×2 IMPLANT
GLIDESHEATH SLEND SS 6F .021 (SHEATH) ×2 IMPLANT
GUIDEWIRE INQWIRE 1.5J.035X260 (WIRE) ×1 IMPLANT
INQWIRE 1.5J .035X260CM (WIRE) ×2
KIT ENCORE 26 ADVANTAGE (KITS) IMPLANT
KIT HEART LEFT (KITS) ×2 IMPLANT
PACK CARDIAC CATHETERIZATION (CUSTOM PROCEDURE TRAY) ×2 IMPLANT
TRANSDUCER W/STOPCOCK (MISCELLANEOUS) ×2 IMPLANT
TUBING CIL FLEX 10 FLL-RA (TUBING) ×2 IMPLANT

## 2017-03-21 NOTE — Interval H&P Note (Signed)
History and Physical Interval Note:  03/21/2017 9:54 AM  William Montgomery  has presented today for cardiac catheterization, with the diagnosis of accelerating angina. The various methods of treatment have been discussed with the patient and family. After consideration of risks, benefits and other options for treatment, the patient has consented to  Procedure(s): LEFT HEART CATH AND CORONARY ANGIOGRAPHY (N/A) as a surgical intervention .  The patient's history has been reviewed, patient examined, no change in status, stable for surgery.  I have reviewed the patient's chart and labs.  Questions were answered to the patient's satisfaction.    Cath Lab Visit (complete for each Cath Lab visit)  Clinical Evaluation Leading to the Procedure:   ACS: No.  Non-ACS:    Anginal Classification: CCS III  Anti-ischemic medical therapy: No Therapy  Non-Invasive Test Results: No non-invasive testing performed  Prior CABG: No previous CABG  Analysa Nutting

## 2017-03-21 NOTE — Discharge Instructions (Signed)
**Note -identified via Obfuscation** Radial Site Care °Refer to this sheet in the next few weeks. These instructions provide you with information about caring for yourself after your procedure. Your health care provider may also give you more specific instructions. Your treatment has been planned according to current medical practices, but problems sometimes occur. Call your health care provider if you have any problems or questions after your procedure. °What can I expect after the procedure? °After your procedure, it is typical to have the following: °· Bruising at the radial site that usually fades within 1-2 weeks. °· Blood collecting in the tissue (hematoma) that may be painful to the touch. It should usually decrease in size and tenderness within 1-2 weeks. ° °Follow these instructions at home: °· Take medicines only as directed by your health care provider. °· You may shower 24-48 hours after the procedure or as directed by your health care provider. Remove the bandage (dressing) and gently wash the site with plain soap and water. Pat the area dry with a clean towel. Do not rub the site, because this may cause bleeding. °· Do not take baths, swim, or use a hot tub until your health care provider approves. °· Check your insertion site every day for redness, swelling, or drainage. °· Do not apply powder or lotion to the site. °· Do not flex or bend the affected arm for 24 hours or as directed by your health care provider. °· Do not push or pull heavy objects with the affected arm for 24 hours or as directed by your health care provider. °· Do not lift over 10 lb (4.5 kg) for 5 days after your procedure or as directed by your health care provider. °· Ask your health care provider when it is okay to: °? Return to work or school. °? Resume usual physical activities or sports. °? Resume sexual activity. °· Do not drive home if you are discharged the same day as the procedure. Have someone else drive you. °· You may drive 24 hours after the procedure  unless otherwise instructed by your health care provider. °· Do not operate machinery or power tools for 24 hours after the procedure. °· If your procedure was done as an outpatient procedure, which means that you went home the same day as your procedure, a responsible adult should be with you for the first 24 hours after you arrive home. °· Keep all follow-up visits as directed by your health care provider. This is important. °Contact a health care provider if: °· You have a fever. °· You have chills. °· You have increased bleeding from the radial site. Hold pressure on the site. °Get help right away if: °· You have unusual pain at the radial site. °· You have redness, warmth, or swelling at the radial site. °· You have drainage (other than a small amount of blood on the dressing) from the radial site. °· The radial site is bleeding, and the bleeding does not stop after 30 minutes of holding steady pressure on the site. °· Your arm or hand becomes pale, cool, tingly, or numb. °This information is not intended to replace advice given to you by your health care provider. Make sure you discuss any questions you have with your health care provider. °Document Released: 05/26/2010 Document Revised: 09/29/2015 Document Reviewed: 11/09/2013 °Elsevier Interactive Patient Education © 2018 [employer] ° °

## 2017-03-21 NOTE — Research (Signed)
OPTIMIZE Informed Consent   Subject Name: William Montgomery  Subject met inclusion and exclusion criteria.  The informed consent form, study requirements and expectations were reviewed with the subject and questions and concerns were addressed prior to the signing of the consent form.  The subject verbalized understanding of the trail requirements.  The subject agreed to participate in the OPTIMIZE trial and signed the informed consent.  The informed consent was obtained prior to performance of any protocol-specific procedures for the subject.  A copy of the signed informed consent was given to the subject and a copy was placed in the subject's medical record. Only applicable if randomized.   [name] 03/21/2017, 0925 am

## 2017-03-31 ENCOUNTER — Encounter: Payer: Self-pay | Admitting: Family Medicine

## 2017-04-16 MED FILL — ISOSORBIDE MN ER 30 MG TAB: 30 | 90 days supply | Qty: 90 | Fill #1

## 2017-04-16 MED FILL — AMITRIPTYLINE HCL 25 MG TAB: 25 | 90 days supply | Qty: 180 | Fill #1

## 2017-04-18 ENCOUNTER — Other Ambulatory Visit: Payer: Self-pay | Admitting: Family Medicine

## 2017-04-18 MED ORDER — CELECOXIB 200 MG PO CAPS: ORAL_CAPSULE | ORAL | 3 refills | Status: DC

## 2017-04-18 MED FILL — CELECOXIB 200 MG CAP: 200 | 90 days supply | Qty: 180 | Fill #0

## 2017-04-18 NOTE — Telephone Encounter (Signed)
William Montgomery from [place] requesting a 90 day refill of celecoxib (CELEBREX) 200 MG capsule.  Pharmacy:  [place], [place] - [place] [phone] (Phone) [phone] (Fax)

## 2017-04-18 NOTE — Telephone Encounter (Signed)
RF request for celecoxib LOV: 10/26/16 Next ov: 05/03/17 Last written: 11/07/15 #60 w/ 3RF  Please advise. Thanks.

## 2017-05-02 NOTE — Progress Notes (Signed)
Subjective:   William Montgomery is a 76 y.o. male who presents for Medicare Annual/Subsequent preventive examination.  Review of Systems:  No ROS.  Medicare Wellness Visit. Additional risk factors are reflected in the social history.  Cardiac Risk Factors include: advanced age (>79mn, >>61women);male gender;dyslipidemia;hypertension   Sleep patterns: Sleeps 7 hours.  Home Safety/Smoke Alarms: Feels safe in home. Smoke alarms in place.  Living environment; residence and Firearm Safety: Lives with wife in 2 story home, lives in hotels d/t work during the week.  Seat Belt Safety/Bike Helmet: Wears seat belt.   Male:   CCS-Colonoscopy 07/25/2011, polyp. Recall 10 years.    PSA-Followed by [facility] Results  Component Value Date   PSA <0.015 07/20/2016   PSA 5.69 (H) 12/24/2014   PSA 4.03 (H) 09/16/2014       Objective:    Vitals: BP 138/72 (BP Location: Left Arm, Patient Position: Sitting, Cuff Size: Normal)   Pulse 60   Temp 97.6 F (36.4 C) (Oral)   Resp 18   Ht 5' 8.11" (1.73 m)   Wt 183 lb 12.8 oz (83.4 kg)   SpO2 98%   BMI 27.86 kg/m   Body mass index is 27.86 kg/m.  Advanced Directives 05/03/2017 03/21/2017 04/27/2016 07/14/2015 07/11/2015  Does Patient Have a Medical Advance Directive? Yes No Yes No No  Type of AParamedicof [place] will - Living will;Healthcare Power of Attorney - -  Copy of HHeboin Chart? Yes - Yes - -  Would patient like information on creating a medical advance directive? - No - Patient declined - No - patient declined information No - patient declined information    Tobacco Social History   Tobacco Use  Smoking Status Former Smoker  . Packs/day: 0.50  . Years: 20.00  . Pack years: 10.00  . Types: Cigarettes  . Last attempt to quit: 05/07/1976  . Years since quitting: 41.0  Smokeless Tobacco Former [employer] developer . Types: Chew  . Quit date: 05/07/1998  Tobacco Comment   Scoal    Counseling given: Not Answered Comment: Scoal     Past Medical History:  Diagnosis Date  . Arthritis   . 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 BPH (benign prostatic hyperplasia)   . Colon polyps 07/2011   hyperplastic 07/25/2011  . Coronary artery disease 12/22/2007   chest pain, cardiac cath demontrated 95% stenosis in the proximal portion of the LAD.  There was a praximal stent with 10-20% restenosis.  The mid LAD had 70% stenosis.  The diagonal had 80-90% stenosis.  The circumflex had luminal irregularities.  Right coronary was free of disease.  He had an EF of 65%.  He subsequently had drug-eluting stent in the proximal mid LAD.   ETT 01/30/11 was NEGATIVE.   .Marland KitchenDDD (degenerative disc disease)    s/p surgery; L/S spine MRI 05/2004 showed DDD/spondylosis with right L3 nerve root abutment, with L5/S1 surgical changes.  . Diverticulosis of sigmoid colon 07/25/11   Severe (endoscopy by Dr. PSharlett Iles  . Fatty liver disease, nonalcoholic 22/3762  mild transaminasemia (Hep B and C testing neg)  . Gallbladder polyp 2013   Noted 06/2011.  Stable on u/s f/u 10/2011.  .Marland KitchenHearing loss 2004   Dr. STamala Julian ENT  . Hyperlipidemia   . Prostate cancer ([facility] 12/2014   Robot assisted radical prostatectomy 07/2015: lymph node involvement.  Plan is active surveillance as per urology: As of  02/01/17 PSA undetectable.  . Tinnitus    Past Surgical History:  Procedure Laterality Date  . APPENDECTOMY  1953  . BACK SURGERY     C-Spine and L-Spine  . CARDIAC CATHETERIZATION    . CARDIOVASCULAR STRESS TEST  2012   Normal  . COLONOSCOPY  02/26/02; 07/2011   Colonoscopy by Dr. Rowe Pavy 2003 was normal (+hx of polyps (adenomatous?) prior), 07/2011 showed one hyperplastic polyp.  . CORONARY STENT PLACEMENT     x 2 (LAD)  . LEFT HEART CATH AND CORONARY ANGIOGRAPHY N/A 03/21/2017   Stents patent; mod circ dz, nothing for intervention--imdur added.  Procedure: LEFT HEART CATH AND CORONARY ANGIOGRAPHY;  Surgeon: Nelva Bush, MD;  Location: [facility];  Service: Cardiovascular;  Laterality: N/A;  . LOBECTOMY     right upper lobectomy  . LYMPHADENECTOMY Bilateral 07/14/2015   Procedure: LYMPHADENECTOMY;  Surgeon: Raynelle Bring, MD;  [facility];  Service: Urology;  Laterality: Bilateral;  . PROSTATE BIOPSY  04/27/15   4 of 12 bx core's + prostate adenocarcinoma (tentative treatment plan is prostatectomy ? + other therapies (as of 05/18/15)  . ROBOT ASSISTED LAPAROSCOPIC RADICAL PROSTATECTOMY N/A 07/14/2015   One positive pelvic LN.  Procedure: XI ROBOTIC ASSISTED LAPAROSCOPIC RADICAL PROSTATECTOMY LEVEL 2;  Surgeon: Raynelle Bring, MD;  Location: [facility];  Service: Urology;  Laterality: N/A;  . UMBILICAL HERNIA REPAIR  07/14/2015   Procedure: HERNIA REPAIR UMBILICAL ADULT;  Surgeon: Raynelle Bring, MD;  [facility];  Service: Urology;;   Family History  Problem Relation Age of Onset  . Cervical cancer Mother        deceased  . Cirrhosis Father   . Colon cancer Neg Hx    Social History   Socioeconomic History  . Marital status: Married    Spouse name: None  . Number of children: 2  . Years of education: None  . Highest education level: None  Social Needs  . Financial resource strain: None  . Food insecurity - worry: None  . Food insecurity - inability: None  . Transportation needs - medical: None  . Transportation needs - non-medical: None  Occupational History  . Occupation: Retired  Tobacco Use  . Smoking status: Former Smoker    Packs/day: 0.50    Years: 20.00    Pack years: 10.00    Types: Cigarettes    Last attempt to quit: 05/07/1976    Years since quitting: 41.0  . Smokeless tobacco: Former Systems developer    Types: Chew    Quit date: 05/07/1998  . Tobacco comment: Scoal  Substance and Sexual Activity  . Alcohol use: Yes    Comment: 1 beer every 6 months  . Drug use: No  . Sexual activity: None  Other Topics Concern  . None  Social History Narrative   Married, 2 adult  children ([place]).   Lives in [place].     Occ: Retired from [employer] ([place] work) at age 25.   Tob (smoke and chew) x 20 yrs, quit approx 1980s.   Alcohol: very rare.   One cup coffee each morning.   Exercise: walks 3 miles about 4 times per week.   Active lifestyle.    Outpatient Encounter Medications as of 05/03/2017  Medication Sig  . amitriptyline (ELAVIL) 25 MG tablet   . aspirin 81 MG tablet Take 81 mg every evening by mouth.   . celecoxib (CELEBREX) 200 MG capsule TAKE 1 CAPSULE (200 MG) BY MOUTH  2 TIMES A DAY AS NEEDED FOR PAIn  . Cholecalciferol (VITAMIN D3) 5000 units CAPS Take 5,000 Units every evening by mouth.  . Coenzyme Q10 (COQ10) 100 MG CAPS Take 100 mg every evening by mouth.  . isosorbide mononitrate (IMDUR) 30 MG 24 hr tablet Take 1 tablet (30 mg total) daily by mouth.  Marland Kitchen lisinopril (PRINIVIL,ZESTRIL) 10 MG tablet Take 1 tablet (10 mg total) daily by mouth.  . Multiple Vitamin (MULTIVITAMIN WITH MINERALS) TABS tablet Take 1 tablet every evening by mouth.  . nitroGLYCERIN (NITROSTAT) 0.4 MG SL tablet PLACE 1 TABLET UNDER THE TONGUE EVERY 5 MINUTES AS NEEDED FOR CHEST PAIN.  . rosuvastatin (CRESTOR) 20 MG tablet TAKE 1 TABLET (20 MG TOTAL) BY MOUTH DAILY. (Patient taking differently: Take 20 mg every evening by mouth. )  . omeprazole (PRILOSEC OTC) 20 MG tablet Take 20 mg daily as needed by mouth (acid reflux).  . Zoster Vaccine Adjuvanted [facility]) injection Inject 0.5 mLs into the muscle once for 1 dose.   No facility-administered encounter medications on file as of 05/03/2017.     Activities of Daily Living In your present state of health, do you have any difficulty performing the following activities: 05/03/2017  Hearing? N  Vision? N  Difficulty concentrating or making decisions? N  Walking or climbing stairs? N  Dressing or bathing? N  Doing errands, shopping? N  Preparing Food and eating ? N  Using the Toilet? N  In the past six months,  have you accidently leaked urine? N  Do you have problems with loss of bowel control? N  Managing your Medications? N  Managing your Finances? N  Housekeeping or managing your Housekeeping? N  Some recent data might be hidden    Patient Care Team: Tammi Sou, MD as PCP - General Raynelle Bring, MD as Consulting Physician (Urology) Minus Breeding, MD as Consulting Physician (Cardiology) End, Harrell Gave, MD as Consulting Physician (Cardiology) [place], Jennefer Bravo, MD as Referring Physician (Dermatology)   Assessment:   This is a routine wellness examination for English.  Exercise Activities and Dietary recommendations Current Exercise Habits: The patient has a physically strenous job, but has no regular exercise apart from work.(Walks 5-9 miles at work), Exercise limited by: None identified Diet (meal preparation, eat out, water intake, caffeinated beverages, dairy products, fruits and vegetables): Drinks water and coffee (1 cup).   Eats out during the week, meat and vegetables.   Goals    . Patient Stated     Maintain current health.        Fall Risk Fall Risk  05/03/2017 04/27/2016 09/23/2015  Falls in the past year? No Yes No  Number falls in past yr: - 1 -  Injury with Fall? - No -  Follow up - Falls prevention discussed -    Depression Screen PHQ 2/9 Scores 05/03/2017 04/27/2016 09/23/2015  PHQ - 2 Score 0 0 0    Cognitive Function MMSE - Mini Mental State Exam 05/03/2017  Orientation to time 5  Orientation to Place 5  Registration 3  Attention/ Calculation 3  Recall 3  Language- name 2 objects 2  Language- repeat 1  Language- follow 3 step command 3  Language- read & follow direction 1  Write a sentence 1  Copy design 1  Total score 28        Immunization History  Administered Date(s) Administered  . Influenza Split 02/18/2012  . Influenza Whole 02/04/2010, 02/09/2011  . Influenza, High Dose Seasonal PF 02/12/2014,  03/25/2015, 01/31/2017    . Influenza,inj,Quad PF,6+ Mos 01/30/2013, 01/20/2016  . Pneumococcal Conjugate-13 03/19/2014  . Pneumococcal Polysaccharide-23 05/08/2007  . Td 05/07/2006  . Tdap 10/26/2016  . Zoster 10/31/2011      Screening Tests Health Maintenance  Topic Date Due  . COLONOSCOPY  07/24/2021  . TETANUS/TDAP  10/27/2026  . INFLUENZA VACCINE  Completed  . PNA vac Low Risk Adult  Completed        Plan:    Shingles vaccine at pharmacy.   Continue doing brain stimulating activities (puzzles, reading, adult coloring books, staying active) to keep memory sharp.   I have personally reviewed and noted the following in the patient's chart:   . Medical and social history . Use of alcohol, tobacco or illicit drugs  . Current medications and supplements . Functional ability and status . Nutritional status . Physical activity . Advanced directives . List of other physicians . Hospitalizations, surgeries, and ER visits in previous 12 months . Vitals . Screenings to include cognitive, depression, and falls . Referrals and appointments  In addition, I have reviewed and discussed with patient certain preventive protocols, quality metrics, and best practice recommendations. A written personalized care plan for preventive services as well as general preventive health recommendations were provided to patient.     Gerilyn Nestle, RN  05/03/2017

## 2017-05-03 ENCOUNTER — Ambulatory Visit: Payer: 59

## 2017-05-03 ENCOUNTER — Ambulatory Visit (INDEPENDENT_AMBULATORY_CARE_PROVIDER_SITE_OTHER): Payer: 59 | Admitting: Family Medicine

## 2017-05-03 ENCOUNTER — Encounter: Payer: Self-pay | Admitting: Family Medicine

## 2017-05-03 ENCOUNTER — Other Ambulatory Visit: Payer: Self-pay

## 2017-05-03 VITALS — BP 138/72 | HR 60 | Temp 97.6°F | Resp 18 | Ht 68.11 in | Wt 183.8 lb

## 2017-05-03 DIAGNOSIS — Z23 Encounter for immunization: Secondary | ICD-10-CM

## 2017-05-03 DIAGNOSIS — I10 Essential (primary) hypertension: Secondary | ICD-10-CM

## 2017-05-03 DIAGNOSIS — E7800 Pure hypercholesterolemia, unspecified: Secondary | ICD-10-CM | POA: Diagnosis not present

## 2017-05-03 DIAGNOSIS — Z0000 Encounter for general adult medical examination without abnormal findings: Secondary | ICD-10-CM | POA: Diagnosis not present

## 2017-05-03 LAB — BASIC METABOLIC PANEL
BUN: 13 mg/dL (ref 6–23)
CHLORIDE: 103 meq/L (ref 96–112)
CO2: 30 mEq/L (ref 19–32)
Calcium: 9.3 mg/dL (ref 8.4–10.5)
Creatinine, Ser: 1.03 mg/dL (ref 0.40–1.50)
GFR: 74.74 mL/min (ref >60.00)
GLUCOSE: 96 mg/dL (ref 70–99)
POTASSIUM: 5.2 meq/L — AB (ref 3.5–5.1)
Sodium: 139 mEq/L (ref 135–145)

## 2017-05-03 LAB — LIPID PANEL
CHOLESTEROL: 106 mg/dL (ref 0–200)
HDL: 33.2 mg/dL — ABNORMAL LOW (ref >39.00)
NonHDL: 72.91
Total CHOL/HDL Ratio: 3
Triglycerides: 221 mg/dL — ABNORMAL HIGH (ref 0.0–149.0)
VLDL: 44.2 mg/dL — AB (ref 0.0–40.0)

## 2017-05-03 LAB — LDL CHOLESTEROL, DIRECT: Direct LDL: 52 mg/dL

## 2017-05-03 MED ORDER — ZOSTER VAC RECOMB ADJUVANTED 50 MCG/0.5ML IM SUSR: 0.5000 mL | Freq: Once | INTRAMUSCULAR | 1 refills | Status: AC

## 2017-05-03 NOTE — Patient Instructions (Signed)

## 2017-05-03 NOTE — Progress Notes (Signed)
Office Note 05/03/2017  CC:  Chief Complaint  Patient presents with  . Medicare Wellness  HM exam.  HPI:  William Montgomery is a 76 y.o. White male who is here for annual health maintenance exam.  Eye exam about 3 mo ago.   Dental: q6 mo preventatives. Exercise: walks 5-9 miles per day at work. Diet: tries to eat healthy, not working on any particular part of diet at this time.  Past Medical History:  Diagnosis Date  . Arthritis   . 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 BPH (benign prostatic hyperplasia)   . Colon polyps 07/2011   hyperplastic 07/25/2011  . Coronary artery disease 12/22/2007   chest pain, cardiac cath demontrated 95% stenosis in the proximal portion of the LAD.  There was a praximal stent with 10-20% restenosis.  The mid LAD had 70% stenosis.  The diagonal had 80-90% stenosis.  The circumflex had luminal irregularities.  Right coronary was free of disease.  He had an EF of 65%.  He subsequently had drug-eluting stent in the proximal mid LAD.   ETT 01/30/11 was NEGATIVE.   Marland Kitchen DDD (degenerative disc disease)    s/p surgery; L/S spine MRI 05/2004 showed DDD/spondylosis with right L3 nerve root abutment, with L5/S1 surgical changes.  . Diverticulosis of sigmoid colon 07/25/11   Severe (endoscopy by Dr. Sharlett Iles)  . Fatty liver disease, nonalcoholic 11/3530   mild transaminasemia (Hep B and C testing neg)  . Gallbladder polyp 2013   Noted 06/2011.  Stable on u/s f/u 10/2011.  Marland Kitchen Hearing loss 2004   Dr. Tamala Julian, ENT  . Hyperlipidemia   . Prostate cancer [facility]) 12/2014   Robot assisted radical prostatectomy 07/2015: lymph node involvement.  Plan is active surveillance as per urology: As of 02/01/17 PSA undetectable.  . Tinnitus     Past Surgical History:  Procedure Laterality Date  . APPENDECTOMY  1953  . BACK SURGERY     C-Spine and L-Spine  . CARDIAC CATHETERIZATION    . CARDIOVASCULAR STRESS TEST  2012   Normal  . COLONOSCOPY  02/26/02; 07/2011   Colonoscopy by  Dr. Rowe Pavy 2003 was normal (+hx of polyps (adenomatous?) prior), 07/2011 showed one hyperplastic polyp.  . CORONARY STENT PLACEMENT     x 2 (LAD)  . LEFT HEART CATH AND CORONARY ANGIOGRAPHY N/A 03/21/2017   Stents patent; mod circ dz, nothing for intervention--imdur added.  Procedure: LEFT HEART CATH AND CORONARY ANGIOGRAPHY;  Surgeon: Nelva Bush, MD;  Location: [facility];  Service: Cardiovascular;  Laterality: N/A;  . LOBECTOMY     right upper lobectomy  . LYMPHADENECTOMY Bilateral 07/14/2015   Procedure: LYMPHADENECTOMY;  Surgeon: Raynelle Bring, MD;  [facility];  Service: Urology;  Laterality: Bilateral;  . PROSTATE BIOPSY  04/27/15   4 of 12 bx core's + prostate adenocarcinoma (tentative treatment plan is prostatectomy ? + other therapies (as of 05/18/15)  . ROBOT ASSISTED LAPAROSCOPIC RADICAL PROSTATECTOMY N/A 07/14/2015   One positive pelvic LN.  Procedure: XI ROBOTIC ASSISTED LAPAROSCOPIC RADICAL PROSTATECTOMY LEVEL 2;  Surgeon: Raynelle Bring, MD;  [facility];  Service: Urology;  Laterality: N/A;  . UMBILICAL HERNIA REPAIR  07/14/2015   Procedure: HERNIA REPAIR UMBILICAL ADULT;  Surgeon: Raynelle Bring, MD;  [facility];  Service: Urology;;    Family History  Problem Relation Age of Onset  . Cervical cancer Mother        deceased  . Cirrhosis Father   . Colon  cancer Neg Hx     Social History   Socioeconomic History  . Marital status: Married    Spouse name: Not on file  . Number of children: 2  . Years of education: Not on file  . Highest education level: Not on file  Social Needs  . Financial resource strain: Not on file  . Food insecurity - worry: Not on file  . Food insecurity - inability: Not on file  . Transportation needs - medical: Not on file  . Transportation needs - non-medical: Not on file  Occupational History  . Occupation: Retired  Tobacco Use  . Smoking status: Former Smoker    Packs/day: 0.50    Years: 20.00    Pack years: 10.00     Types: Cigarettes    Last attempt to quit: 05/07/1976    Years since quitting: 41.0  . Smokeless tobacco: Former Systems developer    Types: Chew    Quit date: 05/07/1998  . Tobacco comment: Scoal  Substance and Sexual Activity  . Alcohol use: Yes    Comment: 1 beer every 6 months  . Drug use: No  . Sexual activity: Not on file  Other Topics Concern  . Not on file  Social History Narrative   Married, 2 adult children ([place]).   Lives in [place].     Occ: Retired from [employer] ([place] work) at age 40.   Tob (smoke and chew) x 20 yrs, quit approx 1980s.   Alcohol: very rare.   One cup coffee each morning.   Exercise: walks 3 miles about 4 times per week.   Active lifestyle.    Outpatient Medications Prior to Visit  Medication Sig Dispense Refill  . amitriptyline (ELAVIL) 25 MG tablet   3  . aspirin 81 MG tablet Take 81 mg every evening by mouth.     . celecoxib (CELEBREX) 200 MG capsule TAKE 1 CAPSULE (200 MG) BY MOUTH 2 TIMES A DAY AS NEEDED FOR PAIn 60 capsule 3  . Cholecalciferol (VITAMIN D3) 5000 units CAPS Take 5,000 Units every evening by mouth.    . Coenzyme Q10 (COQ10) 100 MG CAPS Take 100 mg every evening by mouth.    . isosorbide mononitrate (IMDUR) 30 MG 24 hr tablet Take 1 tablet (30 mg total) daily by mouth. 30 tablet 11  . lisinopril (PRINIVIL,ZESTRIL) 10 MG tablet Take 1 tablet (10 mg total) daily by mouth. 90 tablet 3  . Multiple Vitamin (MULTIVITAMIN WITH MINERALS) TABS tablet Take 1 tablet every evening by mouth.    . nitroGLYCERIN (NITROSTAT) 0.4 MG SL tablet PLACE 1 TABLET UNDER THE TONGUE EVERY 5 MINUTES AS NEEDED FOR CHEST PAIN. 25 tablet 2  . rosuvastatin (CRESTOR) 20 MG tablet TAKE 1 TABLET (20 MG TOTAL) BY MOUTH DAILY. (Patient taking differently: Take 20 mg every evening by mouth. ) 90 tablet 3  . omeprazole (PRILOSEC OTC) 20 MG tablet Take 20 mg daily as needed by mouth (acid reflux).     No facility-administered medications prior to visit.     No  Known Allergies  ROS Review of Systems  PE; Blood pressure 138/72, pulse 60, temperature 97.6 F (36.4 C), temperature source Oral, resp. rate 18, height 5' 8.11" (1.73 m), weight 183 lb 12.8 oz (83.4 kg), SpO2 98 %. Gen: Alert, well appearing.  Patient is oriented to person, place, time, and situation. AFFECT: pleasant, lucid thought and speech. ENT: Ears: EACs clear, normal epithelium.  TMs with good light reflex  and landmarks bilaterally.  Eyes: no injection, icteris, swelling, or exudate.  EOMI, PERRLA. Nose: no drainage or turbinate edema/swelling.  No injection or focal lesion.  Mouth: lips without lesion/swelling.  Oral mucosa pink and moist.  Dentition intact and without obvious caries or gingival swelling.  Oropharynx without erythema, exudate, or swelling.  Neck: supple/nontender.  No LAD, mass, or TM.  Carotid pulses 2+ bilaterally, without bruits. CV: RRR, no m/r/g.   LUNGS: CTA bilat, nonlabored resps, good aeration in all lung fields. ABD: soft, NT, ND, BS normal.  No hepatospenomegaly or mass.  No bruits. EXT: no clubbing, cyanosis, or edema.  Musculoskeletal: no joint swelling, erythema, warmth, or tenderness.  ROM of all joints intact. Skin - no sores or suspicious lesions or rashes or color changes  Pertinent labs:  Lab Results  Component Value Date   TSH 3.080 03/15/2017   Lab Results  Component Value Date   WBC 4.8 03/15/2017   HGB 16.2 03/15/2017   HCT 46.9 03/15/2017   MCV 95 03/15/2017   PLT 217 03/15/2017   Lab Results  Component Value Date   CREATININE 0.98 03/15/2017   BUN 13 03/15/2017   NA 139 03/15/2017   K 4.8 03/15/2017   CL 99 03/15/2017   CO2 25 03/15/2017   Lab Results  Component Value Date   ALT 40 10/26/2016   AST 37 10/26/2016   ALKPHOS 60 10/26/2016   BILITOT 0.7 10/26/2016   Lab Results  Component Value Date   CHOL 102 10/26/2016   Lab Results  Component Value Date   HDL 36.10 (L) 10/26/2016   Lab Results  Component  Value Date   LDLCALC 45 10/26/2016   Lab Results  Component Value Date   TRIG 104.0 10/26/2016   Lab Results  Component Value Date   CHOLHDL 3 10/26/2016   Lab Results  Component Value Date   PSA <0.015 07/20/2016   PSA 5.69 (H) 12/24/2014   PSA 4.03 (H) 09/16/2014    ASSESSMENT AND PLAN:   Health maintenance exam: Reviewed age and gender appropriate health maintenance issues (prudent diet, regular exercise, health risks of tobacco and excessive alcohol, use of seatbelts, fire alarms in home, use of sunscreen).  Also reviewed age and gender appropriate health screening as well as vaccine recommendations. Vaccines: all UTD, including flu vaccine.  Shingrix #1 ordered today. Labs:  BMET, FLP. Prostate ca screening: n/a --pt with hx of prost ca--getting surveillance by urology q34mo Colon ca screening: next colonoscopy due 2023.  An After Visit Summary was printed and given to the patient.  FOLLOW UP:  Return in about 6 months (around 11/01/2017) for routine chronic illness f/u.  Signed:  PCrissie Sickles MD           05/03/2017

## 2017-05-06 ENCOUNTER — Encounter: Payer: Self-pay | Admitting: *Deleted

## 2017-05-06 NOTE — Progress Notes (Signed)
AWV reviewed and agree.  Signed:  Crissie Sickles, MD           05/06/2017

## 2017-05-22 DIAGNOSIS — D1800 Hemangioma unspecified site: Secondary | ICD-10-CM | POA: Diagnosis not present

## 2017-05-22 DIAGNOSIS — L820 Inflamed seborrheic keratosis: Secondary | ICD-10-CM | POA: Diagnosis not present

## 2017-05-22 DIAGNOSIS — L814 Other melanin hyperpigmentation: Secondary | ICD-10-CM | POA: Diagnosis not present

## 2017-05-22 DIAGNOSIS — L570 Actinic keratosis: Secondary | ICD-10-CM | POA: Diagnosis not present

## 2017-05-22 DIAGNOSIS — D225 Melanocytic nevi of trunk: Secondary | ICD-10-CM | POA: Diagnosis not present

## 2017-05-22 DIAGNOSIS — Z23 Encounter for immunization: Secondary | ICD-10-CM | POA: Diagnosis not present

## 2017-05-22 DIAGNOSIS — D485 Neoplasm of uncertain behavior of skin: Secondary | ICD-10-CM | POA: Diagnosis not present

## 2017-05-22 DIAGNOSIS — L281 Prurigo nodularis: Secondary | ICD-10-CM | POA: Diagnosis not present

## 2017-05-22 DIAGNOSIS — L821 Other seborrheic keratosis: Secondary | ICD-10-CM | POA: Diagnosis not present

## 2017-05-30 ENCOUNTER — Emergency Department (INDEPENDENT_AMBULATORY_CARE_PROVIDER_SITE_OTHER): Payer: 59

## 2017-05-30 ENCOUNTER — Emergency Department (INDEPENDENT_AMBULATORY_CARE_PROVIDER_SITE_OTHER)
Admission: EM | Admit: 2017-05-30 | Discharge: 2017-05-30 | Disposition: A | Discharge status: Home / Self Care | Payer: 59 | Source: Home / Self Care | Attending: Family Medicine | Admitting: Family Medicine

## 2017-05-30 ENCOUNTER — Other Ambulatory Visit: Payer: Self-pay

## 2017-05-30 DIAGNOSIS — R05 Cough: Secondary | ICD-10-CM

## 2017-05-30 DIAGNOSIS — R053 Chronic cough: Secondary | ICD-10-CM

## 2017-05-30 MED ORDER — DOXYCYCLINE HYCLATE 100 MG PO CAPS: 100.0000 mg | ORAL_CAPSULE | Freq: Two times a day (BID) | ORAL | 0 refills | Status: DC

## 2017-05-30 MED ORDER — BENZONATATE 200 MG PO CAPS: ORAL_CAPSULE | ORAL | 0 refills | Status: DC

## 2017-05-30 MED ORDER — PREDNISONE 20 MG PO TABS: ORAL_TABLET | ORAL | 0 refills | Status: DC

## 2017-05-30 MED FILL — BENZONATATE 200 MG CAP: 200 | 2 days supply | Qty: 15 | Fill #0

## 2017-05-30 MED FILL — predniSONE 20 MG TABS: 20 | 7 days supply | Qty: 11 | Fill #0

## 2017-05-30 MED FILL — DOXYCYCLINE HYC 100 MG CAP: 100 | 7 days supply | Qty: 14 | Fill #0

## 2017-05-30 NOTE — ED Provider Notes (Signed)
[facility]    CSN: [id number] Arrival date & time: 05/30/17  0906     History   Chief Complaint Chief Complaint  Patient presents with  . Cough    HPI William Montgomery is a 77 y.o. male.   Patient reports that he developed a URI 3+ weeks ago with mild cough, fatigue, sinus congestion, and chills.  He generally improved but has had a persistent mild cough.  During the past 2 days he has felt worse with increased non-productive cough and tightness in his anterior chest.  The cough awakens him at night.  No pleuritic pain or shortness of breath.  No fevers, chills, and sweats. He has a family history of asthma (brother).      Past Medical History:  Diagnosis Date  . Arthritis   . 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 BPH (benign prostatic hyperplasia)   . Colon polyps 07/2011   hyperplastic 07/25/2011  . Coronary artery disease 12/22/2007   chest pain, cardiac cath demontrated 95% stenosis in the proximal portion of the LAD.  There was a praximal stent with 10-20% restenosis.  The mid LAD had 70% stenosis.  The diagonal had 80-90% stenosis.  The circumflex had luminal irregularities.  Right coronary was free of disease.  He had an EF of 65%.  He subsequently had drug-eluting stent in the proximal mid LAD.   ETT 01/30/11 was NEGATIVE.   Marland Kitchen DDD (degenerative disc disease)    s/p surgery; L/S spine MRI 05/2004 showed DDD/spondylosis with right L3 nerve root abutment, with L5/S1 surgical changes.  . Diverticulosis of sigmoid colon 07/25/11   Severe (endoscopy by Dr. Sharlett Iles)  . Fatty liver disease, nonalcoholic 12/3380   mild transaminasemia (Hep B and C testing neg)  . Gallbladder polyp 2013   Noted 06/2011.  Stable on u/s f/u 10/2011.  Marland Kitchen Hearing loss 2004   Dr. Tamala Julian, ENT  . Hyperlipidemia   . Prostate cancer [facility]) 12/2014   Robot assisted radical prostatectomy 07/2015: lymph node involvement.  Plan is active surveillance as per urology: As of 02/01/17 PSA  undetectable.  . Tinnitus     Patient Active Problem List   Diagnosis Date Noted  . Accelerating angina ([place]) 03/21/2017  . Elevated blood pressure reading without diagnosis of hypertension 09/23/2015  . Prostate cancer ([place]) 07/14/2015  . Preventative health care 03/19/2014  . Insomnia 03/19/2014  . Benign prostatic hypertrophy 09/15/2012  . Chronic low back pain 10/15/2011  . Gallbladder polyp 10/15/2011  . Need for shingles vaccine 10/15/2011  . Special screening for malignant neoplasms, colon 07/25/2011  . Personal history of colonic polyps 07/25/2011  . Benign neoplasm of colon 07/25/2011  . Diverticulosis of colon (without mention of hemorrhage) 07/25/2011  . Colon cancer screening 07/12/2011  . Hx of colonic polyp 07/12/2011  . CAD (coronary artery disease) 07/12/2011  . Low back pain 02/13/2011  . Prostate cancer screening 02/13/2011  . HTN (hypertension) 01/03/2011  . Health maintenance examination 10/13/2010  . Transaminasemia 10/13/2010  . Hyperlipidemia 11/06/2008  . Coronary atherosclerosis 11/06/2008  . [place] DISEASE 11/06/2008    Past Surgical History:  Procedure Laterality Date  . APPENDECTOMY  1953  . BACK SURGERY     C-Spine and L-Spine  . CARDIAC CATHETERIZATION    . CARDIOVASCULAR STRESS TEST  2012   Normal  . COLONOSCOPY  02/26/02; 07/2011   Colonoscopy by Dr. Rowe Pavy 2003 was normal (+hx of polyps (adenomatous?) prior), 07/2011 showed one hyperplastic  polyp.  . CORONARY STENT PLACEMENT     x 2 (LAD)  . LEFT HEART CATH AND CORONARY ANGIOGRAPHY N/A 03/21/2017   Stents patent; mod circ dz, nothing for intervention--imdur added.  Procedure: LEFT HEART CATH AND CORONARY ANGIOGRAPHY;  Surgeon: Nelva Bush, MD;  Location: [place] [facility];  Service: Cardiovascular;  Laterality: N/A;  . LOBECTOMY     right upper lobectomy  . LYMPHADENECTOMY Bilateral 07/14/2015   Procedure: LYMPHADENECTOMY;  Surgeon: Raynelle Bring, MD;  Location: [facility];   Service: Urology;  Laterality: Bilateral;  . PROSTATE BIOPSY  04/27/15   4 of 12 bx core's + prostate adenocarcinoma (tentative treatment plan is prostatectomy ? + other therapies (as of 05/18/15)  . ROBOT ASSISTED LAPAROSCOPIC RADICAL PROSTATECTOMY N/A 07/14/2015   One positive pelvic LN.  Procedure: XI ROBOTIC ASSISTED LAPAROSCOPIC RADICAL PROSTATECTOMY LEVEL 2;  Surgeon: Raynelle Bring, MD;  [facility];  Service: Urology;  Laterality: N/A;  . UMBILICAL HERNIA REPAIR  07/14/2015   Procedure: HERNIA REPAIR UMBILICAL ADULT;  Surgeon: Raynelle Bring, MD;  [facility]: [facility];  Service: Urology;;       Home Medications    Prior to Admission medications   Medication Sig Start Date End Date Taking? Authorizing Provider  amitriptyline (ELAVIL) 25 MG tablet  04/16/17   [provider]  aspirin 81 MG tablet Take 81 mg every evening by mouth.     [provider]  benzonatate (TESSALON) 200 MG capsule Take one cap by mouth at bedtime as needed for cough.  May repeat in 4 to 6 hours 05/30/17   Kandra Nicolas, MD  celecoxib (CELEBREX) 200 MG capsule TAKE 1 CAPSULE (200 MG) BY MOUTH 2 TIMES A DAY AS NEEDED FOR PAIn 04/18/17   McGowen, Adrian Blackwater, MD  Cholecalciferol (VITAMIN D3) 5000 units CAPS Take 5,000 Units every evening by mouth.    [provider]  Coenzyme Q10 (COQ10) 100 MG CAPS Take 100 mg every evening by mouth.    [provider]  doxycycline (VIBRAMYCIN) 100 MG capsule Take 1 capsule (100 mg total) by mouth 2 (two) times daily. Take with food. 05/30/17   Kandra Nicolas, MD  isosorbide mononitrate (IMDUR) 30 MG 24 hr tablet Take 1 tablet (30 mg total) daily by mouth. 03/21/17 03/21/18  End, Harrell Gave, MD  lisinopril (PRINIVIL,ZESTRIL) 10 MG tablet Take 1 tablet (10 mg total) daily by mouth. 03/15/17 06/13/17  Minus Breeding, MD  Multiple Vitamin (MULTIVITAMIN WITH MINERALS) TABS tablet Take 1 tablet every evening by mouth.    [provider]    nitroGLYCERIN (NITROSTAT) 0.4 MG SL tablet PLACE 1 TABLET UNDER THE TONGUE EVERY 5 MINUTES AS NEEDED FOR CHEST PAIN. 03/15/17   Minus Breeding, MD  omeprazole (PRILOSEC OTC) 20 MG tablet Take 20 mg daily as needed by mouth (acid reflux).    [provider]  predniSONE (DELTASONE) 20 MG tablet Take one tab by mouth twice daily for 4 days, then one daily for 3 days. Take with food. 05/30/17   Kandra Nicolas, MD  rosuvastatin (CRESTOR) 20 MG tablet TAKE 1 TABLET (20 MG TOTAL) BY MOUTH DAILY. Patient taking differently: Take 20 mg every evening by mouth.  07/02/16   Minus Breeding, MD    Family History Family History  Problem Relation Age of Onset  . Cervical cancer Mother        deceased  . Cirrhosis Father   . Colon cancer Neg Hx     Social History Social  History   Tobacco Use  . Smoking status: Former Smoker    Packs/day: 0.50    Years: 20.00    Pack years: 10.00    Types: Cigarettes    Last attempt to quit: 05/07/1976    Years since quitting: 41.0  . Smokeless tobacco: Former Systems developer    Types: Chew    Quit date: 05/07/1998  . Tobacco comment: Scoal  Substance Use Topics  . Alcohol use: Yes    Comment: 1 beer every 6 months  . Drug use: No     Allergies   Patient has no known allergies.   Review of Systems Review of Systems No sore throat + cough No pleuritic pain No wheezing No nasal congestion No post-nasal drainage No sinus pain/pressure No itchy/red eyes No earache No hemoptysis No SOB No fever/chills No nausea No vomiting No abdominal pain No diarrhea No urinary symptoms No skin rash + fatigue No myalgias No headache Used OTC meds without relief   Physical Exam Triage Vital Signs ED Triage Vitals  Enc Vitals Group     BP 05/30/17 0923 127/80     Pulse Rate 05/30/17 0923 93     Resp --      Temp 05/30/17 0923 (!) 97.4 F (36.3 C)     Temp Source 05/30/17 0923 Oral     SpO2 05/30/17 0923 96 %     Weight 05/30/17 0924 185 lb (83.9  kg)     Height 05/30/17 0924 5' 9"  (1.753 m)     Head Circumference --      Peak Flow --      Pain Score 05/30/17 0924 0     Pain Loc --      Pain Edu? --      Excl. in [place]? --    No data found.  Updated Vital Signs BP 127/80 (BP Location: Right Arm)   Pulse 93   Temp (!) 97.4 F (36.3 C) (Oral)   Ht 5' 9"  (1.753 m)   Wt 185 lb (83.9 kg)   SpO2 96%   BMI 27.32 kg/m   Visual Acuity Right Eye Distance:   Left Eye Distance:   Bilateral Distance:    Right Eye Near:   Left Eye Near:    Bilateral Near:     Physical Exam Nursing notes and Vital Signs reviewed. Appearance:  Patient appears stated age, and in no acute distress Eyes:  Pupils are equal, round, and reactive to light and accomodation.  Extraocular movement is intact.  Conjunctivae are not inflamed  Ears:  Canals normal.  Tympanic membranes normal.  Nose:  Mildly congested turbinates.  No sinus tenderness.  Pharynx:  Normal Neck:  Supple.  Prominent posterior nodes.  Lungs:  Clear to auscultation.  Breath sounds are equal.  Moving air well. Heart:  Regular rate and rhythm without murmurs, rubs, or gallops.  Abdomen:  Nontender without masses or hepatosplenomegaly.  Bowel sounds are present.  No CVA or flank tenderness.  Extremities:  No edema.  Skin:  No rash present.    UC Treatments / Results  Labs (all labs ordered are listed, but only abnormal results are displayed) Labs Reviewed - No data to display  EKG  EKG Interpretation None       Radiology Dg Chest 2 View  Result Date: 05/30/2017 CLINICAL DATA:  Persistent cough. EXAM: CHEST  2 VIEW COMPARISON:  07/11/2015. FINDINGS: Postsurgical changes noted about the right hilum. Postsurgical changes noted about the left apex. Changes  consistent pleuroparenchymal scarring again noted. No focal infiltrate. No pleural effusion or pneumothorax. Heart size normal. No acute bony abnormality. Old bilateral clavicle fractures. Postsurgical changes right fifth rib.  IMPRESSION: Stable postsurgical changes about the chest.  No acute abnormality. Electronically Signed   By: Marcello Moores  Register   On: 05/30/2017 10:53    Procedures Procedures (including critical care time)  Medications Ordered in UC Medications - No data to display   Initial Impression / Assessment and Plan / UC Course  I have reviewed the triage vital signs and the nursing notes.  Pertinent labs & imaging results that were available during my care of the patient were reviewed by me and considered in my medical decision making (see chart for details).    Suspect cough is primarily post-infectious.  Note that patient has a family history of asthma (brother). Will begin doxycycline 173m BID for atypical coverage.  Begin prednisone burst/taper. Prescription written for Benzonatate ([facility] to take at bedtime for night-time cough.  Take plain guaifenesin (12054mextended release tabs such as Mucinex) twice daily, with plenty of water, for cough and congestion.  Get adequate rest.   For sinus congestion, may use Afrin nasal spray (or generic oxymetazoline) each morning for about 5 days and then discontinue.  Also recommend using saline nasal spray several times daily and saline nasal irrigation (AYR is a common brand).    Stop all antihistamines for now, and other non-prescription cough/cold preparations. Followup with Family Doctor if not improved in one week.     Final Clinical Impressions(s) / UC Diagnoses   Final diagnoses:  Persistent cough    ED Discharge Orders        Ordered    doxycycline (VIBRAMYCIN) 100 MG capsule  2 times daily     05/30/17 1116    predniSONE (DELTASONE) 20 MG tablet     05/30/17 1116    benzonatate (TESSALON) 200 MG capsule     05/30/17 1116           BeKandra NicolasMD 06/02/17 1226

## 2017-05-30 NOTE — ED Triage Notes (Signed)
Pt stated that cough started around Dec. 31.  He feels like he is getting better, then he becomes worse again.  He is having tightness/heaviness in his chest, and fatigue.

## 2017-05-30 NOTE — Discharge Instructions (Signed)
Take plain guaifenesin (126m extended release tabs such as Mucinex) twice daily, with plenty of water, for cough and congestion.  Get adequate rest.   For sinus congestion, may use Afrin nasal spray (or generic oxymetazoline) each morning for about 5 days and then discontinue.  Also recommend using saline nasal spray several times daily and saline nasal irrigation (AYR is a common brand).    Stop all antihistamines for now, and other non-prescription cough/cold preparations.

## 2017-06-01 ENCOUNTER — Telehealth: Payer: Self-pay | Admitting: Emergency Medicine

## 2017-06-01 NOTE — Telephone Encounter (Signed)
Patient states that he is feeling better.  Patient will follow up as needed.

## 2017-06-09 ENCOUNTER — Encounter: Payer: Self-pay | Admitting: Cardiology

## 2017-06-09 NOTE — Progress Notes (Signed)
HPI The patient returns for follow up of CAD.  At the last visit he had decreased exercise tolerance.  I sent him for cath and he was found to have 32 - 80% mid/distal circ stenosis and a small D1 80% stenosis and proximal stents in the prox and mid LAD.  He was managed medically.  Following this we try to treat him with Imdur and he developed severe headaches.  He had a cough on lisinopril.  He has since stopped both of these medications.  He can avoid the headache if he takes his Celebrex.  He is still getting some decreased exercise tolerance.  But he is walking 6 miles per day.  He denies any palpitations, presyncope or syncope.  He has no new shortness of breath, PND or orthopnea.  He has had no chest pressure, neck or arm discomfort.   No Known Allergies  Current Outpatient Medications  Medication Sig Dispense Refill  . amitriptyline (ELAVIL) 25 MG tablet   3  . aspirin 81 MG tablet Take 81 mg every evening by mouth.     . celecoxib (CELEBREX) 200 MG capsule TAKE 1 CAPSULE (200 MG) BY MOUTH 2 TIMES A DAY AS NEEDED FOR PAIn 60 capsule 3  . Cholecalciferol (VITAMIN D3) 5000 units CAPS Take 5,000 Units every evening by mouth.    . Coenzyme Q10 (COQ10) 100 MG CAPS Take 100 mg every evening by mouth.    . Multiple Vitamin (MULTIVITAMIN WITH MINERALS) TABS tablet Take 1 tablet every evening by mouth.    . nitroGLYCERIN (NITROSTAT) 0.4 MG SL tablet PLACE 1 TABLET UNDER THE TONGUE EVERY 5 MINUTES AS NEEDED FOR CHEST PAIN. 25 tablet 2  . rosuvastatin (CRESTOR) 20 MG tablet TAKE 1 TABLET (20 MG TOTAL) BY MOUTH DAILY. 90 tablet 1  . losartan (COZAAR) 25 MG tablet Take 1 tablet (25 mg total) by mouth daily. 90 tablet 3   No current facility-administered medications for this visit.     Past Medical History:  Diagnosis Date  . Arthritis   . 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 BPH (benign prostatic hyperplasia)   . Colon polyps 07/2011   hyperplastic 07/25/2011  . Coronary artery disease  12/22/2007   Cath 2019  70 - 80% mid/distal circ stenosis and a small D1 80% stenosis and proximal stents in the prox and mid LAD.   Marland Kitchen DDD (degenerative disc disease)    s/p surgery; L/S spine MRI 05/2004 showed DDD/spondylosis with right L3 nerve root abutment, with L5/S1 surgical changes.  . Diverticulosis of sigmoid colon 07/25/11   Severe (endoscopy by Dr. Sharlett Iles)  . Fatty liver disease, nonalcoholic 05/2876   mild transaminasemia (Hep B and C testing neg)  . Gallbladder polyp 2013   Noted 06/2011.  Stable on u/s f/u 10/2011.  Marland Kitchen Hearing loss 2004   Dr. Tamala Julian, ENT  . Hyperlipidemia   . Prostate cancer [facility]) 12/2014   Robot assisted radical prostatectomy 07/2015: lymph node involvement.  Plan is active surveillance as per urology: As of 02/01/17 PSA undetectable.  . Tinnitus     Past Surgical History:  Procedure Laterality Date  . APPENDECTOMY  1953  . BACK SURGERY     C-Spine and L-Spine  . CARDIAC CATHETERIZATION    . CARDIOVASCULAR STRESS TEST  2012   Normal  . COLONOSCOPY  02/26/02; 07/2011   Colonoscopy by Dr. Rowe Pavy 2003 was normal (+hx of polyps (adenomatous?) prior), 07/2011 showed one hyperplastic polyp.  Marland Kitchen  CORONARY STENT PLACEMENT     x 2 (LAD)  . LEFT HEART CATH AND CORONARY ANGIOGRAPHY N/A 03/21/2017   Stents patent; mod circ dz, nothing for intervention--imdur added.  Procedure: LEFT HEART CATH AND CORONARY ANGIOGRAPHY;  Surgeon: Nelva Bush, MD;  Location: [place] [facility];  Service: Cardiovascular;  Laterality: N/A;  . LOBECTOMY     right upper lobectomy  . LYMPHADENECTOMY Bilateral 07/14/2015   Procedure: LYMPHADENECTOMY;  Surgeon: Raynelle Bring, MD;  [facility];  Service: Urology;  Laterality: Bilateral;  . PROSTATE BIOPSY  04/27/15   4 of 12 bx core's + prostate adenocarcinoma (tentative treatment plan is prostatectomy ? + other therapies (as of 05/18/15)  . ROBOT ASSISTED LAPAROSCOPIC RADICAL PROSTATECTOMY N/A 07/14/2015   One positive pelvic LN.   Procedure: XI ROBOTIC ASSISTED LAPAROSCOPIC RADICAL PROSTATECTOMY LEVEL 2;  Surgeon: Raynelle Bring, MD;  [facility];  Service: Urology;  Laterality: N/A;  . UMBILICAL HERNIA REPAIR  07/14/2015   Procedure: HERNIA REPAIR UMBILICAL ADULT;  Surgeon: Raynelle Bring, MD;  [facility];  Service: Urology;;   Social History   Socioeconomic History  . Marital status: Married    Spouse name: None  . Number of children: 2  . Years of education: None  . Highest education level: None  Social Needs  . Financial resource strain: None  . Food insecurity - worry: None  . Food insecurity - inability: None  . Transportation needs - medical: None  . Transportation needs - non-medical: None  Occupational History  . Occupation: Retired  Tobacco Use  . Smoking status: Former Smoker    Packs/day: 0.50    Years: 20.00    Pack years: 10.00    Types: Cigarettes    Last attempt to quit: 05/07/1976    Years since quitting: 41.1  . Smokeless tobacco: Former Systems developer    Types: Chew    Quit date: 05/07/1998  . Tobacco comment: Scoal  Substance and Sexual Activity  . Alcohol use: Yes    Comment: 1 beer every 6 months  . Drug use: No  . Sexual activity: None  Other Topics Concern  . None  Social History Narrative   Married, 2 adult children ([place]).   Lives in [place].     Occ: Retired from [employer] at age 41.   Tob (smoke and chew) x 20 yrs, quit approx 1980s.   Alcohol: very rare.   One cup coffee each morning.   Exercise: walks 3 miles about 4 times per week.   Active lifestyle.   Family History  Problem Relation Age of Onset  . Cervical cancer Mother        deceased  . Cirrhosis Father   . Colon cancer Neg Hx     ROS:  ED.  Otherwise as stated in the HPI and negative for all other systems.   PHYSICAL EXAM BP (!) 144/83   Pulse 75   Ht 5' 9"  (1.753 m)   Wt 183 lb 3.2 oz (83.1 kg)   BMI 27.05 kg/m   GENERAL:  Well appearing NECK:  No jugular venous  distention, waveform within normal limits, carotid upstroke brisk and symmetric, no bruits, no thyromegaly LUNGS:  Clear to auscultation bilaterally CHEST:  Unremarkable HEART:  PMI not displaced or sustained,S1 and S2 within normal limits, no S3, no S4, no clicks, no rubs, no murmurs ABD:  Flat, positive bowel sounds normal in frequency in pitch, no bruits, no rebound, no  guarding, no midline pulsatile mass, no hepatomegaly, no splenomegaly EXT:  2 plus pulses throughout, no edema, no cyanosis no clubbing    EKG:  NA  Lab Results  Component Value Date   CHOL 106 05/03/2017   TRIG 221.0 (H) 05/03/2017   HDL 33.20 (L) 05/03/2017   LDLCALC 45 10/26/2016   LDLDIRECT 52.0 05/03/2017    ASSESSMEN the  AND PLAN  CAD - He has disease as listed above.    He does not tolerate Imdur and gets severe headaches and this is a second time he is failed this.  I do not want him taking the Celebrex routinely so I will stop the Imdur.    DYSLIPIDEMIA -  Lipids are as above.  He will continue the meds as listed.  HTN (hypertension) -  He had a cough which was likely related to lisinopril.  I will start Cozaar 25 mg daily.  Further adjustments will be based on his response to this.

## 2017-06-10 ENCOUNTER — Other Ambulatory Visit: Payer: Self-pay | Admitting: Cardiology

## 2017-06-10 MED FILL — ROSUVASTATIN CALCIUM 20 MG: 20 | 90 days supply | Qty: 90 | Fill #0

## 2017-06-10 NOTE — Telephone Encounter (Signed)
Rx request sent to pharmacy.  

## 2017-06-11 ENCOUNTER — Ambulatory Visit (INDEPENDENT_AMBULATORY_CARE_PROVIDER_SITE_OTHER): Payer: 59 | Admitting: Cardiology

## 2017-06-11 ENCOUNTER — Encounter: Payer: Self-pay | Admitting: Cardiology

## 2017-06-11 VITALS — BP 144/83 | HR 75 | Ht 69.0 in | Wt 183.2 lb

## 2017-06-11 DIAGNOSIS — I2510 Atherosclerotic heart disease of native coronary artery without angina pectoris: Secondary | ICD-10-CM | POA: Diagnosis not present

## 2017-06-11 DIAGNOSIS — I10 Essential (primary) hypertension: Secondary | ICD-10-CM | POA: Diagnosis not present

## 2017-06-11 DIAGNOSIS — E785 Hyperlipidemia, unspecified: Secondary | ICD-10-CM | POA: Diagnosis not present

## 2017-06-11 MED ORDER — LOSARTAN POTASSIUM 25 MG PO TABS: 25.0000 mg | ORAL_TABLET | Freq: Every day | ORAL | 3 refills | Status: DC

## 2017-06-11 MED FILL — LOSARTAN POTASSIUM 25 MG TA: 25 | 90 days supply | Qty: 90 | Fill #0

## 2017-06-11 NOTE — Patient Instructions (Signed)
Medication Instructions:  START- Losartan 25 mg daily  If you need a refill on your cardiac medications before your next appointment, please call your pharmacy.  Labwork: None Ordered  Testing/Procedures: None Ordered  Follow-Up: Your physician wants you to follow-up in: 6 Months. You should receive a reminder letter in the mail two months in advance. If you do not receive a letter, please call our office [phone].    Thank you for choosing [facility] at [facility]!!

## 2017-06-17 ENCOUNTER — Encounter: Payer: Self-pay | Admitting: Family Medicine

## 2017-07-08 DIAGNOSIS — L570 Actinic keratosis: Secondary | ICD-10-CM | POA: Diagnosis not present

## 2017-07-12 ENCOUNTER — Ambulatory Visit (INDEPENDENT_AMBULATORY_CARE_PROVIDER_SITE_OTHER): Payer: 59 | Admitting: Ophthalmology

## 2017-07-12 DIAGNOSIS — H43813 Vitreous degeneration, bilateral: Secondary | ICD-10-CM | POA: Diagnosis not present

## 2017-07-12 DIAGNOSIS — H2513 Age-related nuclear cataract, bilateral: Secondary | ICD-10-CM

## 2017-07-12 DIAGNOSIS — H353111 Nonexudative age-related macular degeneration, right eye, early dry stage: Secondary | ICD-10-CM

## 2017-07-22 MED FILL — AMITRIPTYLINE HCL 25 MG TAB: 25 | 90 days supply | Qty: 180 | Fill #2

## 2017-07-26 ENCOUNTER — Ambulatory Visit (INDEPENDENT_AMBULATORY_CARE_PROVIDER_SITE_OTHER): Payer: 59 | Admitting: Ophthalmology

## 2017-08-02 DIAGNOSIS — C61 Malignant neoplasm of prostate: Secondary | ICD-10-CM | POA: Diagnosis not present

## 2017-08-09 DIAGNOSIS — C61 Malignant neoplasm of prostate: Secondary | ICD-10-CM | POA: Diagnosis not present

## 2017-08-09 DIAGNOSIS — N5201 Erectile dysfunction due to arterial insufficiency: Secondary | ICD-10-CM | POA: Diagnosis not present

## 2017-08-14 ENCOUNTER — Encounter: Payer: Self-pay | Admitting: Family Medicine

## 2017-09-04 MED FILL — ROSUVASTATIN CALCIUM 20 MG: 20 | 90 days supply | Qty: 90 | Fill #1

## 2017-09-04 MED FILL — LOSARTAN POTASSIUM 25 MG TA: 25 | 90 days supply | Qty: 90 | Fill #1

## 2017-11-01 ENCOUNTER — Ambulatory Visit: Payer: 59 | Admitting: Family Medicine

## 2017-11-25 ENCOUNTER — Ambulatory Visit (INDEPENDENT_AMBULATORY_CARE_PROVIDER_SITE_OTHER): Payer: 59 | Admitting: Family Medicine

## 2017-11-25 ENCOUNTER — Encounter: Payer: Self-pay | Admitting: Family Medicine

## 2017-11-25 VITALS — BP 113/67 | HR 59 | Temp 97.6°F | Resp 16 | Ht 69.0 in | Wt 179.2 lb

## 2017-11-25 DIAGNOSIS — R140 Abdominal distension (gaseous): Secondary | ICD-10-CM

## 2017-11-25 DIAGNOSIS — E663 Overweight: Secondary | ICD-10-CM | POA: Diagnosis not present

## 2017-11-25 DIAGNOSIS — I2510 Atherosclerotic heart disease of native coronary artery without angina pectoris: Secondary | ICD-10-CM

## 2017-11-25 DIAGNOSIS — E7800 Pure hypercholesterolemia, unspecified: Secondary | ICD-10-CM | POA: Diagnosis not present

## 2017-11-25 DIAGNOSIS — K760 Fatty (change of) liver, not elsewhere classified: Secondary | ICD-10-CM

## 2017-11-25 DIAGNOSIS — I10 Essential (primary) hypertension: Secondary | ICD-10-CM

## 2017-11-25 LAB — COMPREHENSIVE METABOLIC PANEL
ALT: 28 U/L (ref 0–53)
AST: 23 U/L (ref 0–37)
Albumin: 4.7 g/dL (ref 3.5–5.2)
Alkaline Phosphatase: 62 U/L (ref 39–117)
BUN: 12 mg/dL (ref 6–23)
CALCIUM: 9.4 mg/dL (ref 8.4–10.5)
CHLORIDE: 103 meq/L (ref 96–112)
CO2: 30 meq/L (ref 19–32)
Creatinine, Ser: 1.12 mg/dL (ref 0.40–1.50)
GFR: 67.75 mL/min (ref >60.00)
Glucose, Bld: 95 mg/dL (ref 70–99)
POTASSIUM: 4.8 meq/L (ref 3.5–5.1)
Sodium: 139 mEq/L (ref 135–145)
Total Bilirubin: 0.8 mg/dL (ref 0.2–1.2)
Total Protein: 7.4 g/dL (ref 6.0–8.3)

## 2017-11-25 LAB — LIPID PANEL
CHOLESTEROL: 124 mg/dL (ref 0–200)
HDL: 38.8 mg/dL — AB (ref >39.00)
NonHDL: 85.08
TRIGLYCERIDES: 215 mg/dL — AB (ref 0.0–149.0)
Total CHOL/HDL Ratio: 3
VLDL: 43 mg/dL — AB (ref 0.0–40.0)

## 2017-11-25 LAB — LDL CHOLESTEROL, DIRECT: Direct LDL: 66 mg/dL

## 2017-11-25 NOTE — Patient Instructions (Addendum)
Take your fiber supplement every day. Take a probiotic (check bottle and make sure it says "gastrointestinal") once a day for 1 month.  Review the handout I gave today for diet and GAS.

## 2017-11-25 NOTE — Progress Notes (Signed)
OFFICE VISIT  11/25/2017   CC:  Chief Complaint  Patient presents with  . Follow-up    RCI, pt is fasting.      HPI:    Patient is a 77 y.o. Caucasian male who presents for f/u CAD, NAFLD, hyperlipidemia, and HTN. Says he has been doing well.  HTN: taking cozaar as per Dr. Percival Spanish. Home bp's <130/80 consistently.  Tolerating all meds fine.  He is active but not really doing any formal exercise regimen. His diet is fair, working some on lower fat, lower simple sugars, smaller portion size.  Has a lot of colon gas x  Last 6 months.  No change in bowel habits. Denies constipation.  His stool is softer than normal consistency but not diarrhea.  No postprandial urgency. Rare GERD episodes.  Occ passes gas per rectum and slight fecal leakage occurs.  ROS: no CP, no SOB, no wheezing, no cough, no dizziness, no HAs, no rashes, no melena/hematochezia.  No polyuria or polydipsia.  No myalgias or arthralgias.   Past Medical History:  Diagnosis Date  . Arthritis   . 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 BPH (benign prostatic hyperplasia)   . Colon polyps 07/2011   hyperplastic 07/25/2011  . Coronary artery disease 12/22/2007   Cath 2019  70 - 80% mid/distal circ stenosis and a small D1 80% stenosis and proximal stents in the prox and mid LAD.   Marland Kitchen DDD (degenerative disc disease)    s/p surgery; L/S spine MRI 05/2004 showed DDD/spondylosis with right L3 nerve root abutment, with L5/S1 surgical changes.  . Diverticulosis of sigmoid colon 07/25/11   Severe (endoscopy by Dr. Sharlett Iles)  . Fatty liver disease, nonalcoholic 0/9604   mild transaminasemia (Hep B and C testing neg)  . Gallbladder polyp 2013   Noted 06/2011.  Stable on u/s f/u 10/2011.  Marland Kitchen Hearing loss 2004   Dr. Tamala Julian, ENT  . Hyperlipidemia   . Prostate cancer [facility]) 12/2014   Robot assisted radical prostatectomy 07/2015: lymph node involvement.  Plan is active surveillance as per urology: As of 08/09/17 PSA undetectable.  .  Tinnitus     Past Surgical History:  Procedure Laterality Date  . APPENDECTOMY  1953  . BACK SURGERY     C-Spine and L-Spine  . CARDIAC CATHETERIZATION    . CARDIOVASCULAR STRESS TEST  2012   Normal  . COLONOSCOPY  02/26/02; 07/2011   Colonoscopy by Dr. Rowe Pavy 2003 was normal (+hx of polyps (adenomatous?) prior), 07/2011 showed one hyperplastic polyp.  . CORONARY STENT PLACEMENT     x 2 (LAD)  . LEFT HEART CATH AND CORONARY ANGIOGRAPHY N/A 03/21/2017   Stents patent; mod circ dz, nothing for intervention--imdur added.  Procedure: LEFT HEART CATH AND CORONARY ANGIOGRAPHY;  Surgeon: Nelva Bush, MD;  Location: [place] [facility];  Service: Cardiovascular;  Laterality: N/A;  . LOBECTOMY     right upper lobectomy  . LYMPHADENECTOMY Bilateral 07/14/2015   Procedure: LYMPHADENECTOMY;  Surgeon: Raynelle Bring, MD;  Location: [facility];  Service: Urology;  Laterality: Bilateral;  . PROSTATE BIOPSY  04/27/15   4 of 12 bx core's + prostate adenocarcinoma (tentative treatment plan is prostatectomy ? + other therapies (as of 05/18/15)  . ROBOT ASSISTED LAPAROSCOPIC RADICAL PROSTATECTOMY N/A 07/14/2015   One positive pelvic LN.  Procedure: XI ROBOTIC ASSISTED LAPAROSCOPIC RADICAL PROSTATECTOMY LEVEL 2;  Surgeon: Raynelle Bring, MD;  Location: [facility];  Service: Urology;  Laterality: N/A;  . UMBILICAL HERNIA REPAIR  07/14/2015   Procedure: HERNIA REPAIR UMBILICAL ADULT;  Surgeon: Raynelle Bring, MD;  Location: [facility];  Service: Urology;;    Outpatient Medications Prior to Visit  Medication Sig Dispense Refill  . amitriptyline (ELAVIL) 25 MG tablet Take 25 mg by mouth at bedtime.   3  . aspirin 81 MG tablet Take 81 mg every evening by mouth.     . celecoxib (CELEBREX) 200 MG capsule TAKE 1 CAPSULE (200 MG) BY MOUTH 2 TIMES A DAY AS NEEDED FOR PAIn 60 capsule 3  . Cholecalciferol (VITAMIN D3) 5000 units CAPS Take 5,000 Units every evening by mouth.    . Coenzyme Q10 (COQ10) 100 MG CAPS Take 100 mg every  evening by mouth.    . Multiple Vitamin (MULTIVITAMIN WITH MINERALS) TABS tablet Take 1 tablet every evening by mouth.    . nitroGLYCERIN (NITROSTAT) 0.4 MG SL tablet PLACE 1 TABLET UNDER THE TONGUE EVERY 5 MINUTES AS NEEDED FOR CHEST PAIN. 25 tablet 2  . rosuvastatin (CRESTOR) 20 MG tablet TAKE 1 TABLET (20 MG TOTAL) BY MOUTH DAILY. 90 tablet 1  . losartan (COZAAR) 25 MG tablet Take 1 tablet (25 mg total) by mouth daily. 90 tablet 3   No facility-administered medications prior to visit.     Allergies  Allergen Reactions  . Ace Inhibitors Cough  . Imdur [Isosorbide Dinitrate] Other (See Comments)    Headache    ROS As per HPI  PE: Blood pressure 113/67, pulse (!) 59, temperature 97.6 F (36.4 C), temperature source Oral, resp. rate 16, height 5' 9"  (1.753 m), weight 179 lb 4 oz (81.3 kg), SpO2 95 %. Body mass index is 26.47 kg/m.  Gen: Alert, well appearing.  Patient is oriented to person, place, time, and situation. AFFECT: pleasant, lucid thought and speech. CV: RRR, no m/r/g.   LUNGS: CTA bilat, nonlabored resps, good aeration in all lung fields. ABD: soft, NT, ND, BS normal.  No hepatospenomegaly or mass.  No bruits. EXT: no clubbing, cyanosis, or edema.    LABS:  Lab Results  Component Value Date   TSH 3.080 03/15/2017   Lab Results  Component Value Date   WBC 4.8 03/15/2017   HGB 16.2 03/15/2017   HCT 46.9 03/15/2017   MCV 95 03/15/2017   PLT 217 03/15/2017   Lab Results  Component Value Date   CREATININE 1.03 05/03/2017   BUN 13 05/03/2017   NA 139 05/03/2017   K 5.2 (H) 05/03/2017   CL 103 05/03/2017   CO2 30 05/03/2017   Lab Results  Component Value Date   ALT 40 10/26/2016   AST 37 10/26/2016   ALKPHOS 60 10/26/2016   BILITOT 0.7 10/26/2016   Lab Results  Component Value Date   CHOL 106 05/03/2017   Lab Results  Component Value Date   HDL 33.20 (L) 05/03/2017   Lab Results  Component Value Date   LDLCALC 45 10/26/2016   Lab Results   Component Value Date   TRIG 221.0 (H) 05/03/2017   Lab Results  Component Value Date   CHOLHDL 3 05/03/2017   Lab Results  Component Value Date   PSA <0.015 07/20/2016   PSA 5.69 (H) 12/24/2014   PSA 4.03 (H) 09/16/2014    IMPRESSION AND PLAN:  1) HTN: The current medical regimen is effective;  continue present plan and medications. Lytes/cr today.  2) HLD: tolerating statin. In setting of NAFLD will check AST/ALT q55mo--check CMET today.  3) NAFLD: he has lost 6 lbs  over the last year or so. Encouraged him to work a bit harder at wt loss efforts in order to help NAFLD.  4) Lower intestinal gas, occ miniscule encopresis when passing gas: No red flags for ominous GI dz. Take fiber supplement DAILY and start probiotic daily (probiotic x 30d). Review the handout I gave today for diet and GAS.  An After Visit Summary was printed and given to the patient.  FOLLOW UP: Return in about 6 months (around 05/28/2018) for annual CPE (fasting).  Signed:  Crissie Sickles, MD           11/25/2017

## 2017-12-06 MED FILL — LOSARTAN POTASSIUM 25 MG TA: 25 | 90 days supply | Qty: 90 | Fill #2

## 2017-12-16 ENCOUNTER — Other Ambulatory Visit: Payer: Self-pay | Admitting: Cardiology

## 2017-12-16 MED FILL — CELECOXIB 200 MG CAP: 200 | 30 days supply | Qty: 60 | Fill #1

## 2017-12-16 MED FILL — ROSUVASTATIN CALCIUM 20 MG: 20 | 90 days supply | Qty: 90 | Fill #0

## 2017-12-26 NOTE — Progress Notes (Signed)
HPI The patient returns for follow up of CAD.  At the last visit he had decreased exercise tolerance.  I sent him for cath and he was found to have 42 - 80% mid/distal circ stenosis and a small D1 80% stenosis and proximal stents in the prox and mid LAD.  He was managed medically.  Following this we try to treat him with Imdur and he developed severe headaches.  He had a cough on lisinopril.   Since I saw him he started taking an off brand Cialis and he noticed that this really helped with his breathing.  He said he takes that medicine he can climb couple flights of stairs.  He actually feels very well.  He is exercising routinely.  He is going to [place] in a few days.  He denies any cardiovascular symptoms.  The patient denies any new symptoms such as chest discomfort, neck or arm discomfort. There has been no new shortness of breath, PND or orthopnea. There have been no reported palpitations, presyncope or syncope.   Allergies  Allergen Reactions  . Ace Inhibitors Cough  . Imdur [Isosorbide Dinitrate] Other (See Comments)    Headache    Current Outpatient Medications  Medication Sig Dispense Refill  . amitriptyline (ELAVIL) 25 MG tablet Take 25 mg by mouth at bedtime.   3  . aspirin 81 MG tablet Take 81 mg every evening by mouth.     . celecoxib (CELEBREX) 200 MG capsule TAKE 1 CAPSULE (200 MG) BY MOUTH 2 TIMES A DAY AS NEEDED FOR PAIn 60 capsule 3  . Cholecalciferol (VITAMIN D3) 5000 units CAPS Take 5,000 Units every evening by mouth.    . Coenzyme Q10 (COQ10) 100 MG CAPS Take 100 mg every evening by mouth.    . Multiple Vitamin (MULTIVITAMIN WITH MINERALS) TABS tablet Take 1 tablet every evening by mouth.    . nitroGLYCERIN (NITROSTAT) 0.4 MG SL tablet PLACE 1 TABLET UNDER THE TONGUE EVERY 5 MINUTES AS NEEDED FOR CHEST PAIN. 25 tablet 2  . rosuvastatin (CRESTOR) 20 MG tablet TAKE 1 TABLET (20 MG TOTAL) BY MOUTH DAILY. 90 tablet 3  . losartan (COZAAR) 25 MG tablet Take 1 tablet  (25 mg total) by mouth daily. 90 tablet 3   No current facility-administered medications for this visit.     Past Medical History:  Diagnosis Date  . Arthritis   . 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 BPH (benign prostatic hyperplasia)   . Colon polyps 07/2011   hyperplastic 07/25/2011  . Coronary artery disease 12/22/2007   Cath 2019  70 - 80% mid/distal circ stenosis and a small D1 80% stenosis and proximal stents in the prox and mid LAD.   Marland Kitchen DDD (degenerative disc disease)    s/p surgery; L/S spine MRI 05/2004 showed DDD/spondylosis with right L3 nerve root abutment, with L5/S1 surgical changes.  . Diverticulosis of sigmoid colon 07/25/11   Severe (endoscopy by Dr. Sharlett Iles)  . Fatty liver disease, nonalcoholic 06/7033   mild transaminasemia (Hep B and C testing neg)  . Gallbladder polyp 2013   Noted 06/2011.  Stable on u/s f/u 10/2011.  Marland Kitchen Hearing loss 2004   Dr. Tamala Julian, ENT  . HTN (hypertension)    ACE-I cough.  Fine on ARB.  Marland Kitchen Hyperlipidemia   . Prostate cancer [facility]) 12/2014   Robot assisted radical prostatectomy 07/2015: lymph node involvement.  Plan is active surveillance as per urology: As of 08/09/17 PSA undetectable.  Marland Kitchen  Tinnitus     Past Surgical History:  Procedure Laterality Date  . APPENDECTOMY  1953  . BACK SURGERY     C-Spine and L-Spine  . CARDIAC CATHETERIZATION    . CARDIOVASCULAR STRESS TEST  2012   Normal  . COLONOSCOPY  02/26/02; 07/2011   Colonoscopy by Dr. Rowe Pavy 2003 was normal (+hx of polyps (adenomatous?) prior), 07/2011 showed one hyperplastic polyp.  . CORONARY STENT PLACEMENT     x 2 (LAD)  . LEFT HEART CATH AND CORONARY ANGIOGRAPHY N/A 03/21/2017   Stents patent; mod circ dz, nothing for intervention--imdur added.  Procedure: LEFT HEART CATH AND CORONARY ANGIOGRAPHY;  Surgeon: Nelva Bush, MD;  Location: [place] [facility];  Service: Cardiovascular;  Laterality: N/A;  . LOBECTOMY     right upper lobectomy  . LYMPHADENECTOMY Bilateral 07/14/2015    Procedure: LYMPHADENECTOMY;  Surgeon: Raynelle Bring, MD;  Location: [facility];  Service: Urology;  Laterality: Bilateral;  . PROSTATE BIOPSY  04/27/15   4 of 12 bx core's + prostate adenocarcinoma (tentative treatment plan is prostatectomy ? + other therapies (as of 05/18/15)  . ROBOT ASSISTED LAPAROSCOPIC RADICAL PROSTATECTOMY N/A 07/14/2015   One positive pelvic LN.  Procedure: XI ROBOTIC ASSISTED LAPAROSCOPIC RADICAL PROSTATECTOMY LEVEL 2;  Surgeon: Raynelle Bring, MD;  Location: [facility];  Service: Urology;  Laterality: N/A;  . UMBILICAL HERNIA REPAIR  07/14/2015   Procedure: HERNIA REPAIR UMBILICAL ADULT;  Surgeon: Raynelle Bring, MD;  Location: [facility];  Service: Urology;;    ROS:   As stated in the HPI and negative for all other systems.   PHYSICAL EXAM BP 128/72 (BP Location: Left Arm, Patient Position: Sitting, Cuff Size: Normal)   Pulse 70   Ht 5' 9"  (1.753 m)   Wt 182 lb (82.6 kg)   BMI 26.88 kg/m   GENERAL:  Well appearing NECK:  No jugular venous distention, waveform within normal limits, carotid upstroke brisk and symmetric, no bruits, no thyromegaly LUNGS:  Clear to auscultation bilaterally CHEST:  Unremarkable HEART:  PMI not displaced or sustained,S1 and S2 within normal limits, no S3, no S4, no clicks, no rubs, no murmurs ABD:  Flat, positive bowel sounds normal in frequency in pitch, no bruits, no rebound, no guarding, no midline pulsatile mass, no hepatomegaly, no splenomegaly EXT:  2 plus pulses throughout, no edema, no cyanosis no clubbing   EKG: Sinus rhythm, rate 70, axis within normal limits, intervals within normal limits, no acute ST-T wave changes.  12/27/2017  Lab Results  Component Value Date   CHOL 124 11/25/2017   TRIG 215.0 (H) 11/25/2017   HDL 38.80 (L) 11/25/2017   LDLCALC 45 10/26/2016   LDLDIRECT 66.0 11/25/2017    ASSESSMEN the  AND PLAN  CAD - The patient has no new sypmtoms.  No further cardiovascular testing is indicated.  We will continue with  aggressive risk reduction and meds as listed.   DYSLIPIDEMIA -  His LDL is at target.  No change in therapy.   HTN (hypertension) -  The blood pressure is at target. No change in medications is indicated. We will continue with therapeutic lifestyle changes (TLC).

## 2017-12-27 ENCOUNTER — Ambulatory Visit (INDEPENDENT_AMBULATORY_CARE_PROVIDER_SITE_OTHER): Payer: 59 | Admitting: Cardiology

## 2017-12-27 ENCOUNTER — Encounter: Payer: Self-pay | Admitting: Cardiology

## 2017-12-27 VITALS — BP 128/72 | HR 70 | Ht 69.0 in | Wt 182.0 lb

## 2017-12-27 DIAGNOSIS — I2510 Atherosclerotic heart disease of native coronary artery without angina pectoris: Secondary | ICD-10-CM | POA: Diagnosis not present

## 2017-12-27 NOTE — Patient Instructions (Signed)
Medication Instructions:  Continue current medications  If you need a refill on your cardiac medications before your next appointment, please call your pharmacy.  Labwork: None Ordered   Testing/Procedures: None Ordered  Follow-Up: Your physician wants you to follow-up in: 1 Year. You should receive a reminder letter in the mail two months in advance. If you do not receive a letter, please call our office in [phone] to schedule your follow-up appointment.     Thank you for choosing [facility] at [facility]!!

## 2018-01-28 ENCOUNTER — Ambulatory Visit (INDEPENDENT_AMBULATORY_CARE_PROVIDER_SITE_OTHER): Payer: 59 | Admitting: *Deleted

## 2018-01-28 ENCOUNTER — Telehealth: Payer: Self-pay | Admitting: Family Medicine

## 2018-01-28 DIAGNOSIS — Z23 Encounter for immunization: Secondary | ICD-10-CM | POA: Diagnosis not present

## 2018-01-28 NOTE — Telephone Encounter (Signed)
Patient inquired about getting shringrix. Can he get it at his January appointment or will his insurance pay for it here?

## 2018-01-29 NOTE — Telephone Encounter (Signed)
Left message for pt to call back.   Pt can get Shingrix vaccine in January at his apt as long as he still has [employer] as primary. If his primary is Medicare we can send a Rx to his pharmacy.

## 2018-01-29 NOTE — Telephone Encounter (Signed)
Spoke with patient reviewed information. Patient verbalized understanding 

## 2018-03-07 MED FILL — LOSARTAN POTASSIUM 25 MG TA: 25 | 90 days supply | Qty: 90 | Fill #3

## 2018-03-07 MED FILL — ROSUVASTATIN CALCIUM 20 MG: 20 | 90 days supply | Qty: 90 | Fill #1

## 2018-03-21 DIAGNOSIS — C61 Malignant neoplasm of prostate: Secondary | ICD-10-CM | POA: Diagnosis not present

## 2018-03-28 DIAGNOSIS — N393 Stress incontinence (female) (male): Secondary | ICD-10-CM | POA: Diagnosis not present

## 2018-03-28 DIAGNOSIS — N5201 Erectile dysfunction due to arterial insufficiency: Secondary | ICD-10-CM | POA: Diagnosis not present

## 2018-03-28 DIAGNOSIS — C61 Malignant neoplasm of prostate: Secondary | ICD-10-CM | POA: Diagnosis not present

## 2018-04-06 ENCOUNTER — Encounter: Payer: Self-pay | Admitting: Family Medicine

## 2018-04-11 DIAGNOSIS — H5201 Hypermetropia, right eye: Secondary | ICD-10-CM | POA: Diagnosis not present

## 2018-04-11 DIAGNOSIS — H524 Presbyopia: Secondary | ICD-10-CM | POA: Diagnosis not present

## 2018-04-11 DIAGNOSIS — H52222 Regular astigmatism, left eye: Secondary | ICD-10-CM | POA: Diagnosis not present

## 2018-04-11 DIAGNOSIS — H52221 Regular astigmatism, right eye: Secondary | ICD-10-CM | POA: Diagnosis not present

## 2018-05-07 DIAGNOSIS — K8020 Calculus of gallbladder without cholecystitis without obstruction: Secondary | ICD-10-CM

## 2018-05-07 DIAGNOSIS — R140 Abdominal distension (gaseous): Secondary | ICD-10-CM

## 2018-05-07 HISTORY — DX: Calculus of gallbladder without cholecystitis without obstruction: K80.20

## 2018-05-07 HISTORY — DX: Abdominal distension (gaseous): R14.0

## 2018-05-08 ENCOUNTER — Emergency Department (HOSPITAL_BASED_OUTPATIENT_CLINIC_OR_DEPARTMENT_OTHER)
Admission: EM | Admit: 2018-05-08 | Discharge: 2018-05-08 | Disposition: A | Discharge status: Home / Self Care | Payer: 59 | Attending: Emergency Medicine | Admitting: Emergency Medicine

## 2018-05-08 ENCOUNTER — Emergency Department (HOSPITAL_BASED_OUTPATIENT_CLINIC_OR_DEPARTMENT_OTHER): Payer: 59

## 2018-05-08 ENCOUNTER — Encounter (HOSPITAL_BASED_OUTPATIENT_CLINIC_OR_DEPARTMENT_OTHER): Payer: Self-pay | Admitting: Emergency Medicine

## 2018-05-08 ENCOUNTER — Other Ambulatory Visit: Payer: Self-pay

## 2018-05-08 DIAGNOSIS — Z79899 Other long term (current) drug therapy: Secondary | ICD-10-CM | POA: Diagnosis not present

## 2018-05-08 DIAGNOSIS — N200 Calculus of kidney: Secondary | ICD-10-CM | POA: Diagnosis not present

## 2018-05-08 DIAGNOSIS — N132 Hydronephrosis with renal and ureteral calculous obstruction: Secondary | ICD-10-CM | POA: Diagnosis not present

## 2018-05-08 DIAGNOSIS — R1032 Left lower quadrant pain: Secondary | ICD-10-CM | POA: Diagnosis not present

## 2018-05-08 DIAGNOSIS — Z87891 Personal history of nicotine dependence: Secondary | ICD-10-CM | POA: Insufficient documentation

## 2018-05-08 DIAGNOSIS — R109 Unspecified abdominal pain: Secondary | ICD-10-CM | POA: Diagnosis present

## 2018-05-08 LAB — LIPASE, BLOOD: Lipase: 32 U/L (ref 11–51)

## 2018-05-08 LAB — CBC WITH DIFFERENTIAL/PLATELET
Abs Immature Granulocytes: 0.02 10*3/uL (ref 0.00–0.07)
Basophils Absolute: 0 10*3/uL (ref 0.0–0.1)
Basophils Relative: 0 %
Eosinophils Absolute: 0 10*3/uL (ref 0.0–0.5)
Eosinophils Relative: 1 %
HCT: 45.7 % (ref 39.0–52.0)
Hemoglobin: 15.4 g/dL (ref 13.0–17.0)
Immature Granulocytes: 0 %
LYMPHS ABS: 0.8 10*3/uL (ref 0.7–4.0)
Lymphocytes Relative: 11 %
MCH: 32.4 pg (ref 26.0–34.0)
MCHC: 33.7 g/dL (ref 30.0–36.0)
MCV: 96 fL (ref 80.0–100.0)
MONO ABS: 0.5 10*3/uL (ref 0.1–1.0)
MONOS PCT: 7 %
Neutro Abs: 6.1 10*3/uL (ref 1.7–7.7)
Neutrophils Relative %: 81 %
Platelets: 164 10*3/uL (ref 150–400)
RBC: 4.76 MIL/uL (ref 4.22–5.81)
RDW: 12.2 % (ref 11.5–15.5)
WBC: 7.5 10*3/uL (ref 4.0–10.5)
nRBC: 0 % (ref 0.0–0.2)

## 2018-05-08 LAB — COMPREHENSIVE METABOLIC PANEL
ALT: 30 U/L (ref 0–44)
AST: 25 U/L (ref 15–41)
Albumin: 4.8 g/dL (ref 3.5–5.0)
Alkaline Phosphatase: 72 U/L (ref 38–126)
Anion gap: 10 (ref 5–15)
BUN: 18 mg/dL (ref 8–23)
CO2: 26 mmol/L (ref 22–32)
Calcium: 9.4 mg/dL (ref 8.9–10.3)
Chloride: 99 mmol/L (ref 98–111)
Creatinine, Ser: 1.64 mg/dL — ABNORMAL HIGH (ref 0.61–1.24)
GFR calc Af Amer: 46 mL/min — ABNORMAL LOW (ref >60)
GFR calc non Af Amer: 40 mL/min — ABNORMAL LOW (ref >60)
Glucose, Bld: 113 mg/dL — ABNORMAL HIGH (ref 70–99)
Potassium: 4.1 mmol/L (ref 3.5–5.1)
Sodium: 135 mmol/L (ref 135–145)
Total Bilirubin: 1.1 mg/dL (ref 0.3–1.2)
Total Protein: 8.1 g/dL (ref 6.5–8.1)

## 2018-05-08 LAB — URINALYSIS, MICROSCOPIC (REFLEX)

## 2018-05-08 LAB — URINALYSIS, ROUTINE W REFLEX MICROSCOPIC
Bilirubin Urine: NEGATIVE
Glucose, UA: NEGATIVE mg/dL
Ketones, ur: NEGATIVE mg/dL
LEUKOCYTES UA: NEGATIVE
NITRITE: NEGATIVE
PROTEIN: NEGATIVE mg/dL
Specific Gravity, Urine: 1.02 (ref 1.005–1.030)
pH: 5.5 (ref 5.0–8.0)

## 2018-05-08 MED ORDER — TAMSULOSIN HCL 0.4 MG PO CAPS: 0.4000 mg | ORAL_CAPSULE | Freq: Every day | ORAL | 0 refills | Status: DC

## 2018-05-08 MED ORDER — OXYCODONE HCL 5 MG PO TABS: 5.0000 mg | ORAL_TABLET | ORAL | 0 refills | Status: DC | PRN

## 2018-05-08 MED ORDER — SODIUM CHLORIDE 0.9 % IV BOLUS
1000.0000 mL | Freq: Once | INTRAVENOUS | Status: AC
  Administered 2018-05-08: 1000 mL via INTRAVENOUS

## 2018-05-08 MED ORDER — ONDANSETRON 4 MG PO TBDP: 4.0000 mg | ORAL_TABLET | Freq: Three times a day (TID) | ORAL | 0 refills | Status: DC | PRN

## 2018-05-08 MED ORDER — IOPAMIDOL (ISOVUE-300) INJECTION 61%
100.0000 mL | Freq: Once | INTRAVENOUS | Status: AC | PRN
  Administered 2018-05-08: 80 mL via INTRAVENOUS

## 2018-05-08 MED ORDER — KETOROLAC TROMETHAMINE 15 MG/ML IJ SOLN
15.0000 mg | Freq: Once | INTRAMUSCULAR | Status: AC
  Administered 2018-05-08: 15 mg via INTRAVENOUS
  Filled 2018-05-08: qty 1

## 2018-05-08 MED ORDER — IOPAMIDOL (ISOVUE-300) INJECTION 61%
30.0000 mL | Freq: Once | INTRAVENOUS | Status: AC | PRN
  Administered 2018-05-08: 30 mL via ORAL

## 2018-05-08 MED ORDER — ONDANSETRON HCL 4 MG/2ML IJ SOLN
4.0000 mg | Freq: Once | INTRAMUSCULAR | Status: AC
  Administered 2018-05-08: 4 mg via INTRAVENOUS
  Filled 2018-05-08: qty 2

## 2018-05-08 MED ORDER — SODIUM CHLORIDE 0.9 % IV BOLUS
500.0000 mL | Freq: Once | INTRAVENOUS | Status: AC
  Administered 2018-05-08: 500 mL via INTRAVENOUS

## 2018-05-08 MED FILL — ONDANSETRON ODT 4 MG TABLET: 4 | 6 days supply | Qty: 20 | Fill #0

## 2018-05-08 MED FILL — TAMSULOSIN HCL 0.4 MG CAP: 0.4 | 30 days supply | Qty: 30 | Fill #0

## 2018-05-08 MED FILL — oxyCODONE HCL 5 MG TABS: 5 | 3 days supply | Qty: 15 | Fill #0

## 2018-05-08 NOTE — ED Provider Notes (Signed)
[place] EMERGENCY DEPARTMENT Provider Note   CSN: [id number] Arrival date & time: 05/08/18  9379     History   Chief Complaint Chief Complaint  Patient presents with  . Abdominal Pain    HPI William Montgomery is a 78 y.o. male.  HPI   Left flank pain for 3-4 weeks, sometimes is very severe, sometimes less severe Last 2 weeks has had stomach cramps, tender abdomen Nauseated this Am, but did not vomit No diarrhea, usually when eat has to go but has not gone as much, still passing flatus No fevers No dysuria Hx of prostatectomy   Past Medical History:  Diagnosis Date  . Arthritis   . 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 BPH (benign prostatic hyperplasia)   . Colon polyps 07/2011   hyperplastic 07/25/2011  . Coronary artery disease 12/22/2007   Cath 03/2017-->  70 - 80% mid/distal circ stenosis and a small D1 80% stenosis and proximal stents in the prox and mid LAD.   Med mgmt rec'd-->nitrates caused HAs, ACE-I caused cough.  . DDD (degenerative disc disease)    s/p surgery; L/S spine MRI 05/2004 showed DDD/spondylosis with right L3 nerve root abutment, with L5/S1 surgical changes.  . Diverticulosis of sigmoid colon 07/25/11   Severe (endoscopy by Dr. Sharlett Iles)  . Fatty liver disease, nonalcoholic 0/2409   mild transaminasemia (Hep B and C testing neg)  . Gallbladder polyp 2013   Noted 06/2011.  Stable on u/s f/u 10/2011.  Marland Kitchen Hearing loss 2004   Dr. Tamala Julian, ENT  . HTN (hypertension)    ACE-I cough.  Fine on ARB.  Marland Kitchen Hyperlipidemia   . Prostate cancer [facility]) 12/2014   Robot assisted radical prostatectomy 07/2015: lymph node involvement.  Plan is active surveillance as per urology: As of 03/2018 PSA undetectable.  . Tinnitus     Patient Active Problem List   Diagnosis Date Noted  . Accelerating angina ([place]) 03/21/2017  . Elevated blood pressure reading without diagnosis of hypertension 09/23/2015  . Prostate cancer ([place]) 07/14/2015  . Preventative health  care 03/19/2014  . Insomnia 03/19/2014  . Benign prostatic hypertrophy 09/15/2012  . Chronic low back pain 10/15/2011  . Gallbladder polyp 10/15/2011  . Need for shingles vaccine 10/15/2011  . Special screening for malignant neoplasms, colon 07/25/2011  . Personal history of colonic polyps 07/25/2011  . Benign neoplasm of colon 07/25/2011  . Diverticulosis of colon (without mention of hemorrhage) 07/25/2011  . Colon cancer screening 07/12/2011  . Hx of colonic polyp 07/12/2011  . CAD (coronary artery disease) 07/12/2011  . Low back pain 02/13/2011  . Prostate cancer screening 02/13/2011  . HTN (hypertension) 01/03/2011  . Health maintenance examination 10/13/2010  . Transaminasemia 10/13/2010  . Hyperlipidemia 11/06/2008  . Coronary atherosclerosis 11/06/2008  . [place] DISEASE 11/06/2008    Past Surgical History:  Procedure Laterality Date  . APPENDECTOMY  1953  . BACK SURGERY     C-Spine and L-Spine  . CARDIAC CATHETERIZATION    . CARDIOVASCULAR STRESS TEST  2012   Normal  . COLONOSCOPY  02/26/02; 07/2011   Colonoscopy by Dr. Rowe Pavy 2003 was normal (+hx of polyps (adenomatous?) prior), 07/2011 showed one hyperplastic polyp.  . CORONARY STENT PLACEMENT     x 2 (LAD)  . LEFT HEART CATH AND CORONARY ANGIOGRAPHY N/A 03/21/2017   Stents patent; mod circ dz, nothing for intervention--imdur added.  Procedure: LEFT HEART CATH AND CORONARY ANGIOGRAPHY;  Surgeon: Nelva Bush, MD;  Location:  [facility] INVASIVE CV LAB;  Service: Cardiovascular;  Laterality: N/A;  . LOBECTOMY     right upper lobectomy  . LYMPHADENECTOMY Bilateral 07/14/2015   Procedure: LYMPHADENECTOMY;  Surgeon: Raynelle Bring, MD;  Location: [facility];  Service: Urology;  Laterality: Bilateral;  . PROSTATE BIOPSY  04/27/15   4 of 12 bx core's + prostate adenocarcinoma (tentative treatment plan is prostatectomy ? + other therapies (as of 05/18/15)  . ROBOT ASSISTED LAPAROSCOPIC RADICAL PROSTATECTOMY N/A 07/14/2015   One  positive pelvic LN.  Procedure: XI ROBOTIC ASSISTED LAPAROSCOPIC RADICAL PROSTATECTOMY LEVEL 2;  Surgeon: Raynelle Bring, MD;  Location: [facility];  Service: Urology;  Laterality: N/A;  . UMBILICAL HERNIA REPAIR  07/14/2015   Procedure: HERNIA REPAIR UMBILICAL ADULT;  Surgeon: Raynelle Bring, MD;  Location: [facility];  Service: Urology;;        Home Medications    Prior to Admission medications   Medication Sig Start Date End Date Taking? Authorizing Provider  amitriptyline (ELAVIL) 25 MG tablet Take 25 mg by mouth at bedtime.  04/16/17   [provider]  aspirin 81 MG tablet Take 81 mg every evening by mouth.     [provider]  celecoxib (CELEBREX) 200 MG capsule TAKE 1 CAPSULE (200 MG) BY MOUTH 2 TIMES A DAY AS NEEDED FOR PAIn 04/18/17   McGowen, Adrian Blackwater, MD  Cholecalciferol (VITAMIN D3) 5000 units CAPS Take 5,000 Units every evening by mouth.    [provider]  Coenzyme Q10 (COQ10) 100 MG CAPS Take 100 mg every evening by mouth.    [provider]  losartan (COZAAR) 25 MG tablet Take 1 tablet (25 mg total) by mouth daily. 06/11/17 09/09/17  Minus Breeding, MD  Multiple Vitamin (MULTIVITAMIN WITH MINERALS) TABS tablet Take 1 tablet every evening by mouth.    [provider]  nitroGLYCERIN (NITROSTAT) 0.4 MG SL tablet PLACE 1 TABLET UNDER THE TONGUE EVERY 5 MINUTES AS NEEDED FOR CHEST PAIN. 03/15/17   Minus Breeding, MD  ondansetron (ZOFRAN ODT) 4 MG disintegrating tablet Take 1 tablet (4 mg total) by mouth every 8 (eight) hours as needed for nausea or vomiting. 05/08/18   Gareth Morgan, MD  oxyCODONE (ROXICODONE) 5 MG immediate release tablet Take 1 tablet (5 mg total) by mouth every 4 (four) hours as needed for severe pain. 05/08/18   Gareth Morgan, MD  rosuvastatin (CRESTOR) 20 MG tablet TAKE 1 TABLET (20 MG TOTAL) BY MOUTH DAILY. 12/16/17   Minus Breeding, MD  tamsulosin (FLOMAX) 0.4 MG CAPS capsule Take 1 capsule (0.4 mg total) by mouth daily. 05/08/18    Gareth Morgan, MD    Family History Family History  Problem Relation Age of Onset  . Cervical cancer Mother        deceased  . Cirrhosis Father   . Colon cancer Neg Hx     Social History Social History   Tobacco Use  . Smoking status: Former Smoker    Packs/day: 0.50    Years: 20.00    Pack years: 10.00    Types: Cigarettes    Last attempt to quit: 05/07/1976    Years since quitting: 42.0  . Smokeless tobacco: Former Systems developer    Types: Chew    Quit date: 05/07/1998  . Tobacco comment: Scoal  Substance Use Topics  . Alcohol use: Yes    Comment: 1 beer every 6 months  . Drug use: No     Allergies   Ace inhibitors and Imdur [isosorbide dinitrate]  Review of Systems Review of Systems  Constitutional: Negative for fever.  HENT: Negative for sore throat.   Eyes: Negative for visual disturbance.  Respiratory: Negative for shortness of breath.   Cardiovascular: Negative for chest pain.  Gastrointestinal: Positive for abdominal distention, abdominal pain and constipation. Negative for diarrhea, nausea and vomiting.  Genitourinary: Positive for flank pain. Negative for difficulty urinating.  Musculoskeletal: Positive for back pain. Negative for neck stiffness.  Skin: Negative for rash.  Neurological: Negative for syncope and headaches.     Physical Exam Updated Vital Signs BP 139/81   Pulse 62   Temp 97.9 F (36.6 C) (Oral)   Resp 18   Ht 5' 9"  (1.753 m)   Wt 83.9 kg   SpO2 94%   BMI 27.32 kg/m   Physical Exam Vitals signs and nursing note reviewed.  Constitutional:      General: He is not in acute distress.    Appearance: He is well-developed. He is not diaphoretic.  HENT:     Head: Normocephalic and atraumatic.  Eyes:     Conjunctiva/sclera: Conjunctivae normal.  Neck:     Musculoskeletal: Normal range of motion.  Cardiovascular:     Rate and Rhythm: Normal rate and regular rhythm.     Heart sounds: Normal heart sounds. No murmur. No friction rub.  No gallop.   Pulmonary:     Effort: Pulmonary effort is normal. No respiratory distress.     Breath sounds: Normal breath sounds. No wheezing or rales.  Abdominal:     General: There is no distension.     Palpations: Abdomen is soft.     Tenderness: There is no abdominal tenderness. There is left CVA tenderness. There is no guarding.  Skin:    General: Skin is warm and dry.  Neurological:     Mental Status: He is alert and oriented to person, place, and time.      ED Treatments / Results  Labs (all labs ordered are listed, but only abnormal results are displayed) Labs Reviewed  URINALYSIS, ROUTINE W REFLEX MICROSCOPIC - Abnormal; Notable for the following components:      Result Value   Hgb urine dipstick TRACE (*)    All other components within normal limits  COMPREHENSIVE METABOLIC PANEL - Abnormal; Notable for the following components:   Glucose, Bld 113 (*)    Creatinine, Ser 1.64 (*)    GFR calc non Af Amer 40 (*)    GFR calc Af Amer 46 (*)    All other components within normal limits  URINALYSIS, MICROSCOPIC (REFLEX) - Abnormal; Notable for the following components:   Bacteria, UA RARE (*)    All other components within normal limits  CBC WITH DIFFERENTIAL/PLATELET  LIPASE, BLOOD    EKG None  Radiology Ct Abdomen Pelvis W Contrast  Result Date: 05/08/2018 CLINICAL DATA:  Left flank pain, left lower quadrant pain EXAM: CT ABDOMEN AND PELVIS WITH CONTRAST TECHNIQUE: Multidetector CT imaging of the abdomen and pelvis was performed using the standard protocol following bolus administration of intravenous contrast. CONTRAST:  38m ISOVUE-300 IOPAMIDOL (ISOVUE-300) INJECTION 61% COMPARISON:  Ultrasound 10/15/2011 FINDINGS: Lower chest: 5 mm left lower lobe pulmonary nodule on image 4. 5 mm lingular nodule on the same image. No effusions. Heart is normal size. Hepatobiliary: Small layering gallstone within the gallbladder. No visible wall thickening. Suspect mild fatty  infiltration of the liver. No biliary ductal dilatation. Pancreas: No focal abnormality or ductal dilatation. Spleen: No focal abnormality.  Normal size.  Adrenals/Urinary Tract: Adrenal glands unremarkable. Moderate left hydronephrosis and hydroureter due to 8 mm distal left ureteral stone. Delayed excretion of contrast from the left kidney. No stones or hydronephrosis on the right. Urinary bladder unremarkable. Stomach/Bowel: Descending colonic and sigmoid diverticulosis. No active diverticulitis. Large stool burden in the colon. Stomach and small bowel decompressed, unremarkable. Vascular/Lymphatic: Aortic atherosclerosis. No enlarged abdominal or pelvic lymph nodes. Reproductive: No visible focal abnormality. Other: No free fluid or free air. Musculoskeletal: No acute bony abnormality. IMPRESSION: 8 mm distal left ureteral stone with moderate left hydronephrosis and hydroureter. Mild fatty infiltration of the liver. Small layering gallstone. Large stool burden in the colon. Two 5 mm nodules in the visualized left lower lung. No follow-up needed if patient is low-risk (and has no known or suspected primary neoplasm). Non-contrast chest CT can be considered in 12 months if patient is high-risk. This recommendation follows the consensus statement: Guidelines for Management of Incidental Pulmonary Nodules Detected on CT Images: From the Fleischner Society 2017; Radiology 2017; 284:228-243. Electronically Signed   By: Rolm Baptise M.D.   On: 05/08/2018 11:25    Procedures Procedures (including critical care time)  Medications Ordered in ED Medications  sodium chloride 0.9 % bolus 500 mL (0 mLs Intravenous Stopped 05/08/18 1153)  iopamidol (ISOVUE-300) 61 % injection 30 mL (30 mLs Oral Contrast Given 05/08/18 0920)  iopamidol (ISOVUE-300) 61 % injection 100 mL (80 mLs Intravenous Contrast Given 05/08/18 1111)  sodium chloride 0.9 % bolus 1,000 mL (0 mLs Intravenous Stopped 05/08/18 1304)  ketorolac (TORADOL) 15  MG/ML injection 15 mg (15 mg Intravenous Given 05/08/18 1152)  ondansetron (ZOFRAN) injection 4 mg (4 mg Intravenous Given 05/08/18 1152)     Initial Impression / Assessment and Plan / ED Course  I have reviewed the triage vital signs and the nursing notes.  Pertinent labs & imaging results that were available during my care of the patient were reviewed by me and considered in my medical decision making (see chart for details).    78yo male presents with concern for abdominal pain, flank pain and nausea. Cr mildly elevated in comparison to prior. UA without infection. CT showed no evidence of obstruction or diverticulitis, however does show an 8 mm ureteral stone with hydronephrosis.  Given IV fluids and pain well controlled.  Discussed with urology.  Given pain controlled, no sign of infection, will have patient follow-up with urology as an outpatient.  He is given a prescription for Flomax, oxycodone, recommend Tylenol for pain. Patient discharged in stable condition with understanding of reasons to return.      Final Clinical Impressions(s) / ED Diagnoses   Final diagnoses:  Nephrolithiasis    ED Discharge Orders         Ordered    oxyCODONE (ROXICODONE) 5 MG immediate release tablet  Every 4 hours PRN     05/08/18 1259    ondansetron (ZOFRAN ODT) 4 MG disintegrating tablet  Every 8 hours PRN     05/08/18 1259    tamsulosin (FLOMAX) 0.4 MG CAPS capsule  Daily     05/08/18 1259           Gareth Morgan, MD 05/08/18 2127

## 2018-05-08 NOTE — ED Notes (Signed)
Patient transported to CT 

## 2018-05-08 NOTE — Discharge Instructions (Signed)
Take Tylenol 1000 mg 4 times a day for 1 week. This is the maximum dose of Tylenol (acetaminophen) you can take from all sources. Please check other over-the-counter medications and prescriptions to ensure you are not taking other medications that contain acetaminophen.   Take oxycodone as needed for breakthrough pain.  This medication can be addicting, sedating and cause constipation.  I recommend a stool softener like colace and taking miralax daily in addition to aggressive hydration to avoid constipation.

## 2018-05-08 NOTE — ED Triage Notes (Addendum)
Reports intermittent left flank pain and lower abdominal pain x 3-4 weeks.  Reports nausea without vomiting.  Denies diarrhea, dysuria, hematuria.  Reports it has been several days since last normal bowel movement. History of diverticulosis.  Reports pain is worse when sitting.

## 2018-05-09 ENCOUNTER — Ambulatory Visit: Payer: 59

## 2018-05-09 ENCOUNTER — Encounter (HOSPITAL_COMMUNITY): Payer: Self-pay | Admitting: *Deleted

## 2018-05-09 ENCOUNTER — Other Ambulatory Visit: Payer: Self-pay

## 2018-05-09 ENCOUNTER — Other Ambulatory Visit: Payer: Self-pay | Admitting: Urology

## 2018-05-09 DIAGNOSIS — N5201 Erectile dysfunction due to arterial insufficiency: Secondary | ICD-10-CM | POA: Diagnosis not present

## 2018-05-09 DIAGNOSIS — C61 Malignant neoplasm of prostate: Secondary | ICD-10-CM | POA: Diagnosis not present

## 2018-05-09 DIAGNOSIS — N393 Stress incontinence (female) (male): Secondary | ICD-10-CM | POA: Diagnosis not present

## 2018-05-09 DIAGNOSIS — N201 Calculus of ureter: Secondary | ICD-10-CM | POA: Diagnosis not present

## 2018-05-09 NOTE — Anesthesia Preprocedure Evaluation (Addendum)
Anesthesia Evaluation  Patient identified by MRN, date of birth, ID band Patient awake    Reviewed: Allergy & Precautions, H&P , NPO status , Patient's Chart, lab work & pertinent test results  History of Anesthesia Complications Negative for: history of anesthetic complications  Airway Mallampati: I       Dental  (+) Upper Dentures, Lower Dentures   Pulmonary former smoker,    Pulmonary exam normal breath sounds clear to auscultation       Cardiovascular hypertension, Pt. on medications + CAD  Normal cardiovascular exam Rhythm:Regular Rate:Normal  Cardiac stent x2 to LAD, normal Echo in 2012 with normal stress  Walks 9 miles a day and continues to work   Neuro/Psych negative neurological ROS  negative psych ROS   GI/Hepatic negative GI ROS, Neg liver ROS,   Endo/Other  negative endocrine ROS  Renal/GU negative Renal ROS     Musculoskeletal  (+) Arthritis ,   Abdominal (+) + obese,   Peds  Hematology negative hematology ROS (+)   Anesthesia Other Findings   Reproductive/Obstetrics negative OB ROS                           Anesthesia Physical  Anesthesia Plan  ASA: II  Anesthesia Plan: General   Post-op Pain Management:    Induction: Intravenous  PONV Risk Score and Plan: 2 and Ondansetron and Dexamethasone  Airway Management Planned: LMA  Additional Equipment:   Intra-op Plan:   Post-operative Plan: Extubation in OR  Informed Consent: I have reviewed the patients History and Physical, chart, labs and discussed the procedure including the risks, benefits and alternatives for the proposed anesthesia with the patient or authorized representative who has indicated his/her understanding and acceptance.     Plan Discussed with: CRNA  Anesthesia Plan Comments:        Anesthesia Quick Evaluation

## 2018-05-10 ENCOUNTER — Ambulatory Visit (HOSPITAL_COMMUNITY): Payer: 59

## 2018-05-10 ENCOUNTER — Encounter (HOSPITAL_COMMUNITY)
Admission: RE | Disposition: A | Discharge status: Home / Self Care | Payer: Self-pay | Source: Home / Self Care | Attending: Urology

## 2018-05-10 ENCOUNTER — Ambulatory Visit (HOSPITAL_COMMUNITY): Payer: 59 | Admitting: Anesthesiology

## 2018-05-10 ENCOUNTER — Ambulatory Visit (HOSPITAL_COMMUNITY)
Admission: RE | Admit: 2018-05-10 | Discharge: 2018-05-10 | Disposition: A | Discharge status: Home / Self Care | Payer: 59 | Attending: Urology | Admitting: Urology

## 2018-05-10 ENCOUNTER — Encounter (HOSPITAL_COMMUNITY): Payer: Self-pay | Admitting: *Deleted

## 2018-05-10 DIAGNOSIS — Z87891 Personal history of nicotine dependence: Secondary | ICD-10-CM | POA: Insufficient documentation

## 2018-05-10 DIAGNOSIS — N400 Enlarged prostate without lower urinary tract symptoms: Secondary | ICD-10-CM | POA: Insufficient documentation

## 2018-05-10 DIAGNOSIS — N201 Calculus of ureter: Secondary | ICD-10-CM | POA: Diagnosis not present

## 2018-05-10 DIAGNOSIS — I10 Essential (primary) hypertension: Secondary | ICD-10-CM | POA: Diagnosis not present

## 2018-05-10 DIAGNOSIS — I2510 Atherosclerotic heart disease of native coronary artery without angina pectoris: Secondary | ICD-10-CM | POA: Insufficient documentation

## 2018-05-10 DIAGNOSIS — E669 Obesity, unspecified: Secondary | ICD-10-CM | POA: Diagnosis not present

## 2018-05-10 DIAGNOSIS — K5730 Diverticulosis of large intestine without perforation or abscess without bleeding: Secondary | ICD-10-CM | POA: Diagnosis not present

## 2018-05-10 DIAGNOSIS — K8020 Calculus of gallbladder without cholecystitis without obstruction: Secondary | ICD-10-CM | POA: Diagnosis not present

## 2018-05-10 DIAGNOSIS — Z86010 Personal history of colonic polyps: Secondary | ICD-10-CM | POA: Diagnosis not present

## 2018-05-10 DIAGNOSIS — E785 Hyperlipidemia, unspecified: Secondary | ICD-10-CM | POA: Diagnosis not present

## 2018-05-10 DIAGNOSIS — Z6827 Body mass index (BMI) 27.0-27.9, adult: Secondary | ICD-10-CM | POA: Insufficient documentation

## 2018-05-10 DIAGNOSIS — K760 Fatty (change of) liver, not elsewhere classified: Secondary | ICD-10-CM | POA: Insufficient documentation

## 2018-05-10 DIAGNOSIS — M1990 Unspecified osteoarthritis, unspecified site: Secondary | ICD-10-CM | POA: Insufficient documentation

## 2018-05-10 DIAGNOSIS — N132 Hydronephrosis with renal and ureteral calculous obstruction: Secondary | ICD-10-CM | POA: Insufficient documentation

## 2018-05-10 DIAGNOSIS — Z8379 Family history of other diseases of the digestive system: Secondary | ICD-10-CM | POA: Insufficient documentation

## 2018-05-10 DIAGNOSIS — Z8546 Personal history of malignant neoplasm of prostate: Secondary | ICD-10-CM | POA: Diagnosis not present

## 2018-05-10 DIAGNOSIS — Z955 Presence of coronary angioplasty implant and graft: Secondary | ICD-10-CM | POA: Diagnosis not present

## 2018-05-10 DIAGNOSIS — Z8049 Family history of malignant neoplasm of other genital organs: Secondary | ICD-10-CM | POA: Insufficient documentation

## 2018-05-10 DIAGNOSIS — Z9079 Acquired absence of other genital organ(s): Secondary | ICD-10-CM | POA: Insufficient documentation

## 2018-05-10 DIAGNOSIS — I700 Atherosclerosis of aorta: Secondary | ICD-10-CM | POA: Insufficient documentation

## 2018-05-10 DIAGNOSIS — N1330 Unspecified hydronephrosis: Secondary | ICD-10-CM | POA: Diagnosis not present

## 2018-05-10 HISTORY — PX: CYSTOSCOPY WITH RETROGRADE PYELOGRAM, URETEROSCOPY AND STENT PLACEMENT: SHX5789

## 2018-05-10 HISTORY — PX: HOLMIUM LASER APPLICATION: SHX5852

## 2018-05-10 SURGERY — CYSTOURETEROSCOPY, WITH RETROGRADE PYELOGRAM AND STENT INSERTION
Anesthesia: General | Site: Ureter | Laterality: Left

## 2018-05-10 MED ORDER — DEXAMETHASONE SODIUM PHOSPHATE 10 MG/ML IJ SOLN
INTRAMUSCULAR | Status: DC | PRN
  Administered 2018-05-10: 10 mg via INTRAVENOUS

## 2018-05-10 MED ORDER — SODIUM CHLORIDE 0.9 % IR SOLN
Status: DC | PRN
  Administered 2018-05-10: 3000 mL
  Administered 2018-05-10: 1000 mL

## 2018-05-10 MED ORDER — EPHEDRINE SULFATE-NACL 50-0.9 MG/10ML-% IV SOSY
PREFILLED_SYRINGE | INTRAVENOUS | Status: DC | PRN
  Administered 2018-05-10: 10 mg via INTRAVENOUS

## 2018-05-10 MED ORDER — PROPOFOL 10 MG/ML IV BOLUS
INTRAVENOUS | Status: DC | PRN
  Administered 2018-05-10: 160 mg via INTRAVENOUS

## 2018-05-10 MED ORDER — EPHEDRINE 5 MG/ML INJ
INTRAVENOUS | Status: AC
  Filled 2018-05-10: qty 10

## 2018-05-10 MED ORDER — OXYCODONE HCL 5 MG/5ML PO SOLN
5.0000 mg | Freq: Once | ORAL | Status: DC | PRN
  Filled 2018-05-10: qty 5

## 2018-05-10 MED ORDER — ACETAMINOPHEN 325 MG PO TABS: 325.0000 mg | ORAL_TABLET | ORAL | Status: DC | PRN

## 2018-05-10 MED ORDER — IOHEXOL 300 MG/ML  SOLN
INTRAMUSCULAR | Status: DC | PRN
  Administered 2018-05-10: 30 mL via URETHRAL

## 2018-05-10 MED ORDER — ONDANSETRON HCL 4 MG/2ML IJ SOLN
INTRAMUSCULAR | Status: DC | PRN
  Administered 2018-05-10: 4 mg via INTRAVENOUS

## 2018-05-10 MED ORDER — OXYCODONE HCL 5 MG PO TABS: 5.0000 mg | ORAL_TABLET | Freq: Once | ORAL | Status: DC | PRN

## 2018-05-10 MED ORDER — PROPOFOL 10 MG/ML IV BOLUS
INTRAVENOUS | Status: AC
  Filled 2018-05-10: qty 20

## 2018-05-10 MED ORDER — ONDANSETRON HCL 4 MG/2ML IJ SOLN
INTRAMUSCULAR | Status: AC
  Filled 2018-05-10: qty 2

## 2018-05-10 MED ORDER — FENTANYL CITRATE (PF) 100 MCG/2ML IJ SOLN: 25.0000 ug | INTRAMUSCULAR | Status: DC | PRN

## 2018-05-10 MED ORDER — ONDANSETRON HCL 4 MG/2ML IJ SOLN: 4.0000 mg | Freq: Once | INTRAMUSCULAR | Status: DC | PRN

## 2018-05-10 MED ORDER — LIDOCAINE 2% (20 MG/ML) 5 ML SYRINGE
INTRAMUSCULAR | Status: DC | PRN
  Administered 2018-05-10: 100 mg via INTRAVENOUS

## 2018-05-10 MED ORDER — FENTANYL CITRATE (PF) 100 MCG/2ML IJ SOLN
INTRAMUSCULAR | Status: AC
  Filled 2018-05-10: qty 2

## 2018-05-10 MED ORDER — KETOROLAC TROMETHAMINE 15 MG/ML IJ SOLN: 15.0000 mg | Freq: Once | INTRAMUSCULAR | Status: DC

## 2018-05-10 MED ORDER — GENTAMICIN SULFATE 40 MG/ML IJ SOLN
5.0000 mg/kg | INTRAVENOUS | Status: AC
  Administered 2018-05-10: 420 mg via INTRAVENOUS
  Filled 2018-05-10: qty 10.5

## 2018-05-10 MED ORDER — CEPHALEXIN 500 MG PO CAPS: 500.0000 mg | ORAL_CAPSULE | Freq: Two times a day (BID) | ORAL | 0 refills | Status: DC

## 2018-05-10 MED ORDER — MEPERIDINE HCL 50 MG/ML IJ SOLN: 6.2500 mg | INTRAMUSCULAR | Status: DC | PRN

## 2018-05-10 MED ORDER — ACETAMINOPHEN 160 MG/5ML PO SOLN: 325.0000 mg | ORAL | Status: DC | PRN

## 2018-05-10 MED ORDER — LACTATED RINGERS IV SOLN
INTRAVENOUS | Status: DC | PRN
  Administered 2018-05-10: 08:00:00 via INTRAVENOUS

## 2018-05-10 MED ORDER — DEXAMETHASONE SODIUM PHOSPHATE 10 MG/ML IJ SOLN
INTRAMUSCULAR | Status: AC
  Filled 2018-05-10: qty 1

## 2018-05-10 MED ORDER — FENTANYL CITRATE (PF) 100 MCG/2ML IJ SOLN
INTRAMUSCULAR | Status: DC | PRN
  Administered 2018-05-10 (×2): 50 ug via INTRAVENOUS

## 2018-05-10 SURGICAL SUPPLY — 24 items
BAG URO CATCHER STRL LF (MISCELLANEOUS) ×3 IMPLANT
BASKET LASER NITINOL 1.9FR (BASKET) IMPLANT
CATH INTERMIT  6FR 70CM (CATHETERS) ×3 IMPLANT
CLOTH BEACON ORANGE TIMEOUT ST (SAFETY) ×3 IMPLANT
EXTRACTOR STONE 1.7FRX115CM (UROLOGICAL SUPPLIES) ×3 IMPLANT
FIBER LASER FLEXIVA 1000 (UROLOGICAL SUPPLIES) IMPLANT
FIBER LASER FLEXIVA 365 (UROLOGICAL SUPPLIES) IMPLANT
FIBER LASER FLEXIVA 550 (UROLOGICAL SUPPLIES) IMPLANT
FIBER LASER TRAC TIP (UROLOGICAL SUPPLIES) ×3 IMPLANT
GLOVE BIOGEL M STRL SZ7.5 (GLOVE) ×3 IMPLANT
GOWN STRL REUS W/TWL LRG LVL3 (GOWN DISPOSABLE) ×6 IMPLANT
GUIDEWIRE ANG ZIPWIRE 038X150 (WIRE) ×3 IMPLANT
GUIDEWIRE STR DUAL SENSOR (WIRE) ×3 IMPLANT
IV NS 1000ML (IV SOLUTION) ×2
IV NS 1000ML BAXH (IV SOLUTION) ×1 IMPLANT
MANIFOLD NEPTUNE II (INSTRUMENTS) ×3 IMPLANT
PACK CYSTO (CUSTOM PROCEDURE TRAY) ×3 IMPLANT
SHEATH URETERAL 12FRX28CM (UROLOGICAL SUPPLIES) IMPLANT
SHEATH URETERAL 12FRX35CM (MISCELLANEOUS) IMPLANT
STENT POLARIS 5FRX26 (STENTS) ×3 IMPLANT
SYR CONTROL 10ML LL (SYRINGE) ×3 IMPLANT
TUBE FEEDING 8FR 16IN STR KANG (MISCELLANEOUS) ×3 IMPLANT
TUBING CONNECTING 10 (TUBING) ×2 IMPLANT
TUBING CONNECTING 10' (TUBING) ×1

## 2018-05-10 NOTE — Anesthesia Postprocedure Evaluation (Signed)
Anesthesia Post Note  Patient: William Montgomery  Procedure(s) Performed: CYSTOSCOPY WITH LEFT RETROGRADE PYELOGRAM, LEFT URETEROSCOPY AND LEFT URETERAL STENT PLACEMENT (Left Ureter) HOLMIUM LASER APPLICATION (Left Ureter)     Patient location during evaluation: PACU Anesthesia Type: General Level of consciousness: awake and sedated Pain management: pain level controlled Vital Signs Assessment: post-procedure vital signs reviewed and stable Respiratory status: spontaneous breathing Cardiovascular status: stable Postop Assessment: no apparent nausea or vomiting Anesthetic complications: no    Last Vitals:  Vitals:   05/10/18 0643 05/10/18 0852  BP:  (!) 149/82  Pulse: 71 70  Resp:  10  Temp:  (!) 36.4 C  SpO2: 100% 100%    Last Pain:  Vitals:   05/10/18 0900  TempSrc:   PainSc: 0-No pain   Pain Goal:                 Huston Foley

## 2018-05-10 NOTE — Addendum Note (Signed)
Addendum  created 05/10/18 1157 by Lyn Hollingshead, MD   Clinical Note Signed

## 2018-05-10 NOTE — H&P (Signed)
William Montgomery is an 78 y.o. male.    Chief Complaint: Pre-Op LEFT Ureteroscopic Stone Manipulation  HPI:   1 - LEFT Distal Ureteral Stone - 23m left distal stoen by ER CT 05/08/18 on eval flank pain. Stone is solitary with mild hydro, >1000HU. Cr 1.6. UA without infectious parameters.  Today "William Montgomery is seen to proceed with LEFT ureteroscopic stone manipulation. No interval fevers.   Past Medical History:  Diagnosis Date  . Arthritis   . 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 BPH (benign prostatic hyperplasia)   . Colon polyps 07/2011   hyperplastic 07/25/2011  . Coronary artery disease 12/22/2007   Cath 03/2017-->  70 - 80% mid/distal circ stenosis and a small D1 80% stenosis and proximal stents in the prox and mid LAD.   Med mgmt rec'd-->nitrates caused HAs, ACE-I caused cough.  . DDD (degenerative disc disease)    s/p surgery; L/S spine MRI 05/2004 showed DDD/spondylosis with right L3 nerve root abutment, with L5/S1 surgical changes.  . Diverticulosis of sigmoid colon 07/25/11   Severe (endoscopy by Dr. PSharlett Iles  . Fatty liver disease, nonalcoholic 25/9741  mild transaminasemia (Hep B and C testing neg)  . Gallbladder polyp 2013   Noted 06/2011.  Stable on u/s f/u 10/2011.  .Marland KitchenHearing loss 2004   Dr. ST[facility]  . HTN (hypertension)    ACE-I cough.  Fine on ARB.  .Marland KitchenHyperlipidemia   . Prostate cancer ([facility] 12/2014   Robot assisted radical prostatectomy 07/2015: lymph node involvement.  Plan is active surveillance as per urology: As of 03/2018 PSA undetectable.  . Tinnitus     Past Surgical History:  Procedure Laterality Date  . APPENDECTOMY  1953  . BACK SURGERY     C-Spine and L-Spine  . CARDIAC CATHETERIZATION    . CARDIOVASCULAR STRESS TEST  2012   Normal  . COLONOSCOPY  02/26/02; 07/2011   Colonoscopy by Dr. ARowe Pavy2003 was normal (+hx of polyps (adenomatous?) prior), 07/2011 showed one hyperplastic polyp.  . CORONARY STENT PLACEMENT     x 2 (LAD)  . LEFT HEART CATH AND  CORONARY ANGIOGRAPHY N/A 03/21/2017   Stents patent; mod circ dz, nothing for intervention--imdur added.  Procedure: LEFT HEART CATH AND CORONARY ANGIOGRAPHY;  Surgeon: ENelva Bush MD;  [facility];  Service: Cardiovascular;  Laterality: N/A;  . LOBECTOMY     right upper lobectomy  . LYMPHADENECTOMY Bilateral 07/14/2015   Procedure: LYMPHADENECTOMY;  Surgeon: LRaynelle Bring MD;  Location: [facility];  Service: Urology;  Laterality: Bilateral;  . PROSTATE BIOPSY  04/27/15   4 of 12 bx core's + prostate adenocarcinoma (tentative treatment plan is prostatectomy ? + other therapies (as of 05/18/15)  . ROBOT ASSISTED LAPAROSCOPIC RADICAL PROSTATECTOMY N/A 07/14/2015   One positive pelvic LN.  Procedure: XI ROBOTIC ASSISTED LAPAROSCOPIC RADICAL PROSTATECTOMY LEVEL 2;  Surgeon: LRaynelle Bring MD;  Location: [facility];  Service: Urology;  Laterality: N/A;  . UMBILICAL HERNIA REPAIR  07/14/2015   Procedure: HERNIA REPAIR UMBILICAL ADULT;  Surgeon: LRaynelle Bring MD;  Location: [facility];  Service: Urology;;    Family History  Problem Relation Age of Onset  . Cervical cancer Mother        deceased  . Cirrhosis Father   . Colon cancer Neg Hx    Social History:  reports that he quit smoking about 42 years ago. His smoking use included cigarettes. He has a 10.00 pack-year smoking history. He quit smokeless  tobacco use about 20 years ago.  His smokeless tobacco use included chew. He reports current alcohol use. He reports that he does not use drugs.  Allergies:  Allergies  Allergen Reactions  . Ace Inhibitors Cough  . Imdur [Isosorbide Dinitrate] Other (See Comments)    Headache    No medications prior to admission.    Results for orders placed or performed during the hospital encounter of 05/08/18 (from the past 48 hour(s))  Urinalysis, Routine w reflex microscopic     Status: Abnormal   Collection Time: 05/08/18  8:53 AM  Result Value Ref Range   Color, Urine YELLOW YELLOW    APPearance CLEAR CLEAR   Specific Gravity, Urine 1.020 1.005 - 1.030   pH 5.5 5.0 - 8.0   Glucose, UA NEGATIVE NEGATIVE mg/dL   Hgb urine dipstick TRACE (A) NEGATIVE   Bilirubin Urine NEGATIVE NEGATIVE   Ketones, ur NEGATIVE NEGATIVE mg/dL   Protein, ur NEGATIVE NEGATIVE mg/dL   Nitrite NEGATIVE NEGATIVE   Leukocytes, UA NEGATIVE NEGATIVE    Comment: Performed at [facility], [address] [place], [place] [address]  Urinalysis, Microscopic (reflex)     Status: Abnormal   Collection Time: 05/08/18  8:53 AM  Result Value Ref Range   RBC / HPF 0-5 0 - 5 RBC/hpf   WBC, UA 0-5 0 - 5 WBC/hpf   Bacteria, UA RARE (A) NONE SEEN   Squamous Epithelial / LPF 0-5 0 - 5    Comment: Performed at [facility], [address] [place], [place] [address]  CBC with Differential     Status: None   Collection Time: 05/08/18  9:28 AM  Result Value Ref Range   WBC 7.5 4.0 - 10.5 K/uL   RBC 4.76 4.22 - 5.81 MIL/uL   Hemoglobin 15.4 13.0 - 17.0 g/dL   HCT 45.7 39.0 - 52.0 %   MCV 96.0 80.0 - 100.0 fL   MCH 32.4 26.0 - 34.0 pg   MCHC 33.7 30.0 - 36.0 g/dL   RDW 12.2 11.5 - 15.5 %   Platelets 164 150 - 400 K/uL   nRBC 0.0 0.0 - 0.2 %   Neutrophils Relative % 81 %   Neutro Abs 6.1 1.7 - 7.7 K/uL   Lymphocytes Relative 11 %   Lymphs Abs 0.8 0.7 - 4.0 K/uL   Monocytes Relative 7 %   Monocytes Absolute 0.5 0.1 - 1.0 K/uL   Eosinophils Relative 1 %   Eosinophils Absolute 0.0 0.0 - 0.5 K/uL   Basophils Relative 0 %   Basophils Absolute 0.0 0.0 - 0.1 K/uL   Immature Granulocytes 0 %   Abs Immature Granulocytes 0.02 0.00 - 0.07 K/uL    Comment: Performed at [facility], [address] [place], [place] [address]  Comprehensive metabolic panel     Status: Abnormal   Collection Time: 05/08/18  9:28 AM  Result Value Ref Range   Sodium 135 135 - 145 mmol/L   Potassium 4.1 3.5 - 5.1 mmol/L   Chloride 99 98 - 111 mmol/L   CO2 26 22 - 32 mmol/L   Glucose, Bld 113 (H)  70 - 99 mg/dL   BUN 18 8 - 23 mg/dL   Creatinine, Ser 1.64 (H) 0.61 - 1.24 mg/dL   Calcium 9.4 8.9 - 10.3 mg/dL   Total Protein 8.1 6.5 - 8.1 g/dL   Albumin 4.8 3.5 - 5.0 g/dL   AST 25 15 -  41 U/L   ALT 30 0 - 44 U/L   Alkaline Phosphatase 72 38 - 126 U/L   Total Bilirubin 1.1 0.3 - 1.2 mg/dL   GFR calc non Af Amer 40 (L) >60 mL/min   GFR calc Af Amer 46 (L) >60 mL/min   Anion gap 10 5 - 15    Comment: Performed at [facility], [address] [place], [place] [address]  Lipase, blood     Status: None   Collection Time: 05/08/18  9:28 AM  Result Value Ref Range   Lipase 32 11 - 51 U/L    Comment: Performed at [facility], [address] [place], [place] [address]   Ct Abdomen Pelvis W Contrast  Result Date: 05/08/2018 CLINICAL DATA:  Left flank pain, left lower quadrant pain EXAM: CT ABDOMEN AND PELVIS WITH CONTRAST TECHNIQUE: Multidetector CT imaging of the abdomen and pelvis was performed using the standard protocol following bolus administration of intravenous contrast. CONTRAST:  85m ISOVUE-300 IOPAMIDOL (ISOVUE-300) INJECTION 61% COMPARISON:  Ultrasound 10/15/2011 FINDINGS: Lower chest: 5 mm left lower lobe pulmonary nodule on image 4. 5 mm lingular nodule on the same image. No effusions. Heart is normal size. Hepatobiliary: Small layering gallstone within the gallbladder. No visible wall thickening. Suspect mild fatty infiltration of the liver. No biliary ductal dilatation. Pancreas: No focal abnormality or ductal dilatation. Spleen: No focal abnormality.  Normal size. Adrenals/Urinary Tract: Adrenal glands unremarkable. Moderate left hydronephrosis and hydroureter due to 8 mm distal left ureteral stone. Delayed excretion of contrast from the left kidney. No stones or hydronephrosis on the right. Urinary bladder unremarkable. Stomach/Bowel: Descending colonic and sigmoid diverticulosis. No active diverticulitis. Large stool burden in the colon. Stomach and small  bowel decompressed, unremarkable. Vascular/Lymphatic: Aortic atherosclerosis. No enlarged abdominal or pelvic lymph nodes. Reproductive: No visible focal abnormality. Other: No free fluid or free air. Musculoskeletal: No acute bony abnormality. IMPRESSION: 8 mm distal left ureteral stone with moderate left hydronephrosis and hydroureter. Mild fatty infiltration of the liver. Small layering gallstone. Large stool burden in the colon. Two 5 mm nodules in the visualized left lower lung. No follow-up needed if patient is low-risk (and has no known or suspected primary neoplasm). Non-contrast chest CT can be considered in 12 months if patient is high-risk. This recommendation follows the consensus statement: Guidelines for Management of Incidental Pulmonary Nodules Detected on CT Images: From the Fleischner Society 2017; Radiology 2017; 284:228-243. Electronically Signed   By: KRolm BaptiseM.D.   On: 05/08/2018 11:25    Review of Systems  Constitutional: Negative.  Negative for chills and fever.  HENT: Negative.   Eyes: Negative.   Respiratory: Negative.   Cardiovascular: Negative.   Gastrointestinal: Negative.   Genitourinary: Negative.   Musculoskeletal: Negative.   Skin: Negative.   Neurological: Negative.   Endo/Heme/Allergies: Negative.   Psychiatric/Behavioral: Negative.     There were no vitals taken for this visit. Physical Exam  Constitutional: He appears well-developed.  HENT:  Head: Normocephalic.  Eyes: Pupils are equal, round, and reactive to light.  Neck: Normal range of motion.  Cardiovascular: Normal rate.  Respiratory: Effort normal.  Genitourinary:    Genitourinary Comments: Mild left CVAT   Musculoskeletal: Normal range of motion.  Neurological: He is alert.  Skin: Skin is warm.  Psychiatric: He has a normal mood and affect.     Assessment/Plan  Proceed as planned with LEFT ureteroscopic stone manipulation with goal of stone free. Risks, benefits,  alternatives,  expected peri-op course discussed previously and reiterated today.   Alexis Frock, MD 05/10/2018, 6:17 AM

## 2018-05-10 NOTE — Anesthesia Procedure Notes (Signed)
Procedure Name: LMA Insertion Date/Time: 05/10/2018 8:05 AM Performed by: Marquinn Meschke D, CRNA Pre-anesthesia Checklist: Patient identified, Emergency Drugs available, Suction available and Patient being monitored Patient Re-evaluated:Patient Re-evaluated prior to induction Oxygen Delivery Method: Circle system utilized Preoxygenation: Pre-oxygenation with 100% oxygen Induction Type: IV induction Ventilation: Mask ventilation without difficulty LMA: LMA inserted LMA Size: 4.0 Tube type: Oral Number of attempts: 1 Placement Confirmation: positive ETCO2 and breath sounds checked- equal and bilateral Tube secured with: Tape Dental Injury: Teeth and Oropharynx as per pre-operative assessment

## 2018-05-10 NOTE — Discharge Instructions (Signed)
1 - You may have urinary urgency (bladder spasms) and bloody urine on / off with stent in place. This is normal.  2 - Remove tethered stent on Monday morning at home by pulling on string, then blue-white plastic tubing, and discarding. Office is open Monday if any problems arise.   3 - Call MD or go to ER for fever >102, severe pain / nausea / vomiting not relieved by medications, or acute change in medical status   General Anesthesia, Adult, Care After This sheet gives you information about how to care for yourself after your procedure. Your health care provider may also give you more specific instructions. If you have problems or questions, contact your health care provider. What can I expect after the procedure? After the procedure, the following side effects are common:  Pain or discomfort at the IV site.  Nausea.  Vomiting.  Sore throat.  Trouble concentrating.  Feeling cold or chills.  Weak or tired.  Sleepiness and fatigue.  Soreness and body aches. These side effects can affect parts of the body that were not involved in surgery. Follow these instructions at home:  For at least 24 hours after the procedure:  Have a responsible adult stay with you. It is important to have someone help care for you until you are awake and alert.  Rest as needed.  Do not: ? Participate in activities in which you could fall or become injured. ? Drive. ? Use heavy machinery. ? Drink alcohol. ? Take sleeping pills or medicines that cause drowsiness. ? Make important decisions or sign legal documents. ? Take care of children on your own. Eating and drinking  Follow any instructions from your health care provider about eating or drinking restrictions.  When you feel hungry, start by eating small amounts of foods that are soft and easy to digest (bland), such as toast. Gradually return to your regular diet.  Drink enough fluid to keep your urine pale yellow.  If you vomit,  rehydrate by drinking water, juice, or clear broth. General instructions  If you have sleep apnea, surgery and certain medicines can increase your risk for breathing problems. Follow instructions from your health care provider about wearing your sleep device: ? Anytime you are sleeping, including during daytime naps. ? While taking prescription pain medicines, sleeping medicines, or medicines that make you drowsy.  Return to your normal activities as told by your health care provider. Ask your health care provider what activities are safe for you.  Take over-the-counter and prescription medicines only as told by your health care provider.  If you smoke, do not smoke without supervision.  Keep all follow-up visits as told by your health care provider. This is important. Contact a health care provider if:  You have nausea or vomiting that does not get better with medicine.  You cannot eat or drink without vomiting.  You have pain that does not get better with medicine.  You are unable to pass urine.  You develop a skin rash.  You have a fever.  You have redness around your IV site that gets worse. Get help right away if:  You have difficulty breathing.  You have chest pain.  You have blood in your urine or stool, or you vomit blood. Summary  After the procedure, it is common to have a sore throat or nausea. It is also common to feel tired.  Have a responsible adult stay with you for the first 24 hours after general anesthesia. It  is important to have someone help care for you until you are awake and alert.  When you feel hungry, start by eating small amounts of foods that are soft and easy to digest (bland), such as toast. Gradually return to your regular diet.  Drink enough fluid to keep your urine pale yellow.  Return to your normal activities as told by your health care provider. Ask your health care provider what activities are safe for you. This information is not  intended to replace advice given to you by your health care provider. Make sure you discuss any questions you have with your health care provider. Document Released: 07/30/2000 Document Revised: 12/07/2016 Document Reviewed: 12/07/2016 Elsevier Interactive Patient Education  2019 [employer].

## 2018-05-10 NOTE — Progress Notes (Signed)
PACU PHASE 2 Nursing Note:  Pt meets DC criteria to go home per MD orders. Pt alert and oriented, MAE x 4, VSS, daughter here with pt. DC instructions provided. Reviewed Rx from MD and importance of completing all abx as prescribed. Discussed importance of making follow up appt, importance of handwashing. Instructions on removing stent at home were also provided. Pt and daughter understood instructions. Also discussed times to call MD or report to the ED. General Anesthesia instructions also provided, re: resting for the remainder of the day, safety while taking PO pain med, advancing diet. Opportunity for questions provided. Copy of DC instructions as outlined by MD and Anesthesia Team was also provided

## 2018-05-10 NOTE — Transfer of Care (Signed)
Immediate Anesthesia Transfer of Care Note  Patient: MOSTYN VARNELL  Procedure(s) Performed: CYSTOSCOPY WITH LEFT RETROGRADE PYELOGRAM, LEFT URETEROSCOPY AND LEFT URETERAL STENT PLACEMENT (Left Ureter) HOLMIUM LASER APPLICATION (Left Ureter)  Patient Location: PACU  Anesthesia Type:General  Level of Consciousness: awake, alert  and oriented  Airway & Oxygen Therapy: Patient Spontanous Breathing and Patient connected to face mask oxygen  Post-op Assessment: Report given to RN and Post -op Vital signs reviewed and stable  Post vital signs: Reviewed and stable  Last Vitals:  Vitals Value Taken Time  BP 149/82 05/10/2018  8:51 AM  Temp    Pulse 65 05/10/2018  8:53 AM  Resp 8 05/10/2018  8:53 AM  SpO2 100 % 05/10/2018  8:53 AM  Vitals shown include unvalidated device data.  Last Pain:  Vitals:   05/10/18 0640  TempSrc: Oral         Complications: No apparent anesthesia complications

## 2018-05-10 NOTE — Brief Op Note (Signed)
05/10/2018  8:58 AM  PATIENT:  William Montgomery  78 y.o. male  PRE-OPERATIVE DIAGNOSIS:  LEFT URETERAL STONE  POST-OPERATIVE DIAGNOSIS:  LEFT URETERAL STONE  PROCEDURE:  Procedure(s): CYSTOSCOPY WITH LEFT RETROGRADE PYELOGRAM, LEFT URETEROSCOPY AND LEFT URETERAL STENT PLACEMENT (Left) HOLMIUM LASER APPLICATION (Left)  SURGEON:  Surgeon(s) and Role:    Alexis Frock, MD - Primary  PHYSICIAN ASSISTANT:   ASSISTANTS: none   ANESTHESIA:   general  EBL:  minimal   BLOOD ADMINISTERED:none  DRAINS: none   LOCAL MEDICATIONS USED:  NONE  SPECIMEN:  Source of Specimen:  left ureteral stone fragments  DISPOSITION OF SPECIMEN:  [facility] for compositional analysis  COUNTS:  YES  TOURNIQUET:  * No tourniquets in log *  DICTATION: .Other Dictation: Dictation Number [id number]  PLAN OF CARE: Discharge to home after PACU  PATIENT DISPOSITION:  PACU - hemodynamically stable.   Delay start of Pharmacological VTE agent (>24hrs) due to surgical blood loss or risk of bleeding: yes

## 2018-05-11 ENCOUNTER — Encounter (HOSPITAL_COMMUNITY): Payer: Self-pay | Admitting: Urology

## 2018-05-11 NOTE — Op Note (Signed)
NAME: William Montgomery, William Montgomery. MEDICAL RECORD [id number] ACCOUNT [id number] DATE OF BIRTH:[birth year] FACILITY: [facility] LOCATION: [facility] PHYSICIAN:Lillith Mcneff, MD  OPERATIVE REPORT  DATE OF PROCEDURE:  05/10/2018  PREOPERATIVE DIAGNOSIS:  Left ureteral stone.  PROCEDURE: 1.  Cystoscopy and left retrograde pyelogram interpretation. 2.  Left ureteroscopy with laser lithotripsy. 3.  Insertion of left ureteral stent 5 x 26 Polaris with tether.  SURGEON:  Sebastian Ache, MD  ESTIMATED BLOOD LOSS:  Nil.  COMPLICATIONS:  None.  SPECIMENS:  Left distal ureteral stone fragments for composition analysis.  FINDINGS: 1.  Obstructing left distal ureteral stone with proximal moderate hydroureteronephrosis. 2.  Complete resolution of all stone fragments larger than one-third millimeter following laser lithotripsy and basket extraction. 3.  Successful placement of left ureteral stent, proximal in the renal pelvis, distal end in urinary bladder. 4.  Status post prostatectomy.  INDICATIONS:  The patient is a very pleasant and very vigorous 78 year old man who was found on workup of colicky flank pain to have a left distal ureteral stone.  This was quite large.  His colic has been difficult to control pain wise.  Options were  discussed for management including continued medical therapy versus shockwave lithotripsy versus ureteroscopy with the latter being favored given the stone size and location, and he wished to proceed.  Informed consent was obtained and placed in medical  record.  Nursing tell.  DESCRIPTION OF PROCEDURE:  The patient being confirmed and procedure of left ureteroscopic with stent placement was confirmed.  Procedure timeout was performed.  Intravenous antibiotics were administered.  General anesthesia induced.  The patient was  placed into a low lithotomy position.  Sterile field was created by prepping and draping his penis, perineum and proximal thighs using iodine.   Cystourethroscopy was performed using a 22-French rigid cystoscope with offset lens.  Inspection of anterior  and posterior urethra revealed surgically absent prostate, wide open bladder neck and membranous urethra with excellent membranous urethral coaptation.  Inspection of the urinary bladder revealed no trabeculation.  Ureteral orifices were singleton  bilaterally.  The left ureteral orifice was cannulated with 6-French renal catheter and left retrograde pyelogram was obtained.  Left retrograde pyelogram demonstrated a single left ureter with single system left kidney.  There was moderate hydroureteronephrosis to a filling defect in the distal ureter consistent with known stone.  A 0.038 ZIPwire was advanced to the level of the  upper pole and set aside as a safety wire.  An 8-French feeding tube was placed in the urinary bladder for pressure release, and semirigid ureteroscopy was performed of the distal left ureter alongside a separate Sensor working wire.  As expected, there  was a distal left ureteral stone.  It was impacted.  It was a fusiform.  It was much too large for simple basketing.  As such, holmium laser energy applied to the stone using setting of 0.3 joules and 30 Hz. It was fragmented in approximately 6 smaller  pieces.  These were then sequentially grasped in their long axis and removed and set aside for composition analysis.  Following this, there was complete resolution of all stone fragments in the distal half of the ureter larger than one-third millimeter.   We achieved the goals of the surgery today.  Given the significant hydronephrosis and distal impaction, it was felt that brief interval stenting with tethered stent would be warranted and a new 5 x 26 Polaris-type stent was placed using fluoroscopic  guidance over the remaining safety wire.  Tether was left  in place and fashioned to the dorsum of the penis.  Procedure terminated.  The patient tolerated the procedure well.  No  immediate complications.  The patient did postanesthesia care in stable  condition.  JN/NUANCE  D:05/10/2018 T:05/11/2018 JOB:[id number]

## 2018-05-12 DIAGNOSIS — N201 Calculus of ureter: Secondary | ICD-10-CM | POA: Diagnosis not present

## 2018-05-15 ENCOUNTER — Encounter: Payer: Self-pay | Admitting: Family Medicine

## 2018-05-26 ENCOUNTER — Encounter: Payer: 59 | Admitting: Family Medicine

## 2018-05-28 ENCOUNTER — Other Ambulatory Visit: Payer: Self-pay | Admitting: Cardiology

## 2018-05-28 MED FILL — LOSARTAN POTASSIUM 25 MG TA: 25 | 90 days supply | Qty: 90 | Fill #0

## 2018-05-28 MED FILL — ROSUVASTATIN CALCIUM 20 MG: 20 | 90 days supply | Qty: 90 | Fill #2

## 2018-05-29 NOTE — Progress Notes (Signed)
Subjective:   William Montgomery is a 78 y.o. male who presents for Medicare Annual/Subsequent preventive examination.  Review of Systems:  No ROS.  Medicare Wellness Visit. Additional risk factors are reflected in the social history.  Cardiac Risk Factors include: advanced age (>47mn, >>59women);dyslipidemia;male gender   Sleep patterns: Sleeps 6-8 hours.  Home Safety/Smoke Alarms: Feels safe in home. Smoke alarms in place.  Living environment; residence and Firearm Safety: Lives with wife in 2 story home.  Seat Belt Safety/Bike Helmet: Wears seat belt.   Male:   CCS-Colonoscopy 07/25/2011, polyp. Recall 10 years.       PSA-Followed by [facility]  Lab Results  Component Value Date   PSA <0.015 07/20/2016   PSA 5.69 (H) 12/24/2014   PSA 4.03 (H) 09/16/2014       Objective:    Vitals: BP 138/80 (BP Location: Left Arm, Patient Position: Sitting, Cuff Size: Normal)   Pulse 67   Resp 18   Ht 5' 9"  (1.753 m)   Wt 181 lb 6 oz (82.3 kg)   SpO2 95%   BMI 26.78 kg/m   Body mass index is 26.78 kg/m.  Advanced Directives 05/30/2018 05/09/2018 05/08/2018 05/03/2017 03/21/2017 04/27/2016 07/14/2015  Does Patient Have a Medical Advance Directive? Yes Yes No Yes No Yes No  Type of Advance Directive Living will;Healthcare Power of [place] - Living will;Healthcare Power of Attorney -  [place] of [place]? Yes - validated most recent copy scanned in chart (See row information) - - Yes - Yes -  Would patient like information on creating a medical advance directive? - - No - Patient declined - No - Patient declined - No - patient declined information    Tobacco Social History   Tobacco Use  Smoking Status Former Smoker  . Packs/day: 0.50  . Years: 20.00  . Pack years: 10.00  . Types: Cigarettes  . Last attempt to quit: 05/07/1976  . Years since quitting: 42.0  Smokeless Tobacco Former USystems developer . Types: Chew  .  Quit date: 05/07/1998  Tobacco Comment   Scoal     Counseling given: Not Answered Comment: Scoal    Past Medical History:  Diagnosis Date  . Arthritis   . 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 BPH (benign prostatic hyperplasia)   . Colon polyps 07/2011   hyperplastic 07/25/2011  . Coronary artery disease 12/22/2007   Cath 03/2017-->  70 - 80% mid/distal circ stenosis and a small D1 80% stenosis and proximal stents in the prox and mid LAD.   Med mgmt rec'd-->nitrates caused HAs, ACE-I caused cough.  . DDD (degenerative disc disease)    s/p surgery; L/S spine MRI 05/2004 showed DDD/spondylosis with right L3 nerve root abutment, with L5/S1 surgical changes.  . Diverticulosis of sigmoid colon 07/25/11   Severe (endoscopy by Dr. PSharlett Iles  . Erectile dysfunction   . Fatty liver disease, nonalcoholic 21/9379  mild transaminasemia (Hep B and C testing neg)  . Gallbladder polyp 2013   Noted 06/2011.  Stable on u/s f/u 10/2011.  .Marland KitchenHearing loss 2004   Dr. STamala Julian [facility]  . History of prostate cancer 12/2014   Robot assisted radical prostatectomy 07/2015: lymph node involvement.  Plan is active surveillance as per urology: As of 03/2018 PSA undetectable.  .Marland KitchenHTN (hypertension)    ACE-I cough.  Fine on ARB.  .Marland KitchenHyperlipidemia   . Nephrolithiasis  8 mm left distal ureteral stone 05/2018, got ureteroscopy with holmium laser treatment.  . Tinnitus    Past Surgical History:  Procedure Laterality Date  . APPENDECTOMY  1953  . BACK SURGERY     C-Spine and L-Spine  . CARDIAC CATHETERIZATION    . CARDIOVASCULAR STRESS TEST  2012   Normal  . COLONOSCOPY  02/26/02; 07/2011   Colonoscopy by Dr. Rowe Pavy 2003 was normal (+hx of polyps (adenomatous?) prior), 07/2011 showed one hyperplastic polyp.  . CORONARY STENT PLACEMENT     x 2 (LAD)  . CYSTOSCOPY WITH RETROGRADE PYELOGRAM, URETEROSCOPY AND STENT PLACEMENT Left 05/10/2018   Procedure: CYSTOSCOPY WITH LEFT RETROGRADE PYELOGRAM, LEFT URETEROSCOPY AND  LEFT URETERAL STENT PLACEMENT;  Surgeon: Alexis Frock, MD;  Location: [facility];  Service: Urology;  Laterality: Left;  . HOLMIUM LASER APPLICATION Left 05/13/107   Procedure: HOLMIUM LASER APPLICATION;  Surgeon: Alexis Frock, MD;  Location: [facility];  Service: Urology;  Laterality: Left;  . LEFT HEART CATH AND CORONARY ANGIOGRAPHY N/A 03/21/2017   Stents patent; mod circ dz, nothing for intervention--imdur added.  Procedure: LEFT HEART CATH AND CORONARY ANGIOGRAPHY;  Surgeon: Nelva Bush, MD;  Location: [place] [facility];  Service: Cardiovascular;  Laterality: N/A;  . LOBECTOMY     right upper lobectomy for blastomycosis  . LYMPHADENECTOMY Bilateral 07/14/2015   Procedure: LYMPHADENECTOMY;  Surgeon: Raynelle Bring, MD;  Location: [facility];  Service: Urology;  Laterality: Bilateral;  . PROSTATE BIOPSY  04/27/15   4 of 12 bx core's + prostate adenocarcinoma (tentative treatment plan is prostatectomy ? + other therapies (as of 05/18/15)  . ROBOT ASSISTED LAPAROSCOPIC RADICAL PROSTATECTOMY N/A 07/14/2015   One positive pelvic LN.  Procedure: XI ROBOTIC ASSISTED LAPAROSCOPIC RADICAL PROSTATECTOMY LEVEL 2;  Surgeon: Raynelle Bring, MD;  Location: [facility];  Service: Urology;  Laterality: N/A;  . UMBILICAL HERNIA REPAIR  07/14/2015   Procedure: HERNIA REPAIR UMBILICAL ADULT;  Surgeon: Raynelle Bring, MD;  Location: [facility];  Service: Urology;;   Family History  Problem Relation Age of Onset  . Cervical cancer Mother        deceased  . Cirrhosis Father   . Colon cancer Neg Hx    Social History   Socioeconomic History  . Marital status: Married    Spouse name: Not on file  . Number of children: 2  . Years of education: Not on file  . Highest education level: Not on file  Occupational History  . Occupation: Retired  Scientific laboratory technician  . Financial resource strain: Not on file  . Food insecurity:    Worry: Not on file    Inability: Not on file  . Transportation needs:    Medical: Not on file     Non-medical: Not on file  Tobacco Use  . Smoking status: Former Smoker    Packs/day: 0.50    Years: 20.00    Pack years: 10.00    Types: Cigarettes    Last attempt to quit: 05/07/1976    Years since quitting: 42.0  . Smokeless tobacco: Former Systems developer    Types: Chew    Quit date: 05/07/1998  . Tobacco comment: Scoal  Substance and Sexual Activity  . Alcohol use: Yes    Comment: 1 beer every 6 months  . Drug use: No  . Sexual activity: Not on file  Lifestyle  . Physical activity:    Days per week: Not on file    Minutes per session: Not on file  . Stress:  Not on file  Relationships  . Social connections:    Talks on phone: Not on file    Gets together: Not on file    Attends religious service: Not on file    Active member of club or organization: Not on file    Attends meetings of clubs or organizations: Not on file    Relationship status: Not on file  Other Topics Concern  . Not on file  Social History Narrative   Married, 2 adult children ([place]).   Lives in [place].     Occ: Retired from [employer] ([place] work) at age 84.   Tob (smoke and chew) x 20 yrs, quit approx 1980s.   Alcohol: very rare.   One cup coffee each morning.   Exercise: walks 3 miles about 4 times per week.   Active lifestyle.    Outpatient Encounter Medications as of 05/30/2018  Medication Sig  . amitriptyline (ELAVIL) 25 MG tablet Take 25 mg by mouth at bedtime.   Marland Kitchen aspirin 81 MG tablet Take 81 mg every evening by mouth.   . celecoxib (CELEBREX) 200 MG capsule TAKE 1 CAPSULE (200 MG) BY MOUTH 2 TIMES A DAY AS NEEDED FOR PAIn (Patient taking differently: Take 200 mg by mouth daily as needed (pain.). )  . Coenzyme Q10 (COQ10) 100 MG CAPS Take 100 mg every evening by mouth.  . losartan (COZAAR) 25 MG tablet TAKE 1 TABLET (25 MG TOTAL) BY MOUTH DAILY.  . rosuvastatin (CRESTOR) 20 MG tablet TAKE 1 TABLET (20 MG TOTAL) BY MOUTH DAILY. (Patient taking differently: Take 20 mg by mouth every evening.  )  . Cholecalciferol (VITAMIN D3) 5000 units CAPS Take 5,000 Units every evening by mouth.  . Multiple Vitamin (MULTIVITAMIN WITH MINERALS) TABS tablet Take 1 tablet every evening by mouth.  . nitroGLYCERIN (NITROSTAT) 0.4 MG SL tablet PLACE 1 TABLET UNDER THE TONGUE EVERY 5 MINUTES AS NEEDED FOR CHEST PAIN. (Patient not taking: Reported on 05/30/2018)  . [DISCONTINUED] cephALEXin (KEFLEX) 500 MG capsule Take 1 capsule (500 mg total) by mouth 2 (two) times daily. To prevent infection with stent in place.  . [DISCONTINUED] ondansetron (ZOFRAN ODT) 4 MG disintegrating tablet Take 1 tablet (4 mg total) by mouth every 8 (eight) hours as needed for nausea or vomiting. (Patient not taking: Reported on 05/30/2018)  . [DISCONTINUED] oxyCODONE (ROXICODONE) 5 MG immediate release tablet Take 1 tablet (5 mg total) by mouth every 4 (four) hours as needed for severe pain. (Patient not taking: Reported on 05/30/2018)  . [DISCONTINUED] tamsulosin (FLOMAX) 0.4 MG CAPS capsule Take 1 capsule (0.4 mg total) by mouth daily. (Patient not taking: Reported on 05/30/2018)  . [DISCONTINUED] Zoster Vaccine Adjuvanted [facility]) injection Inject 0.5 mLs into the muscle once for 1 dose. (Patient not taking: Reported on 05/30/2018)   No facility-administered encounter medications on file as of 05/30/2018.     Activities of Daily Living In your present state of health, do you have any difficulty performing the following activities: 05/30/2018  Hearing? N  Vision? N  Difficulty concentrating or making decisions? N  Walking or climbing stairs? N  Dressing or bathing? N  Doing errands, shopping? N  Preparing Food and eating ? N  Using the Toilet? N  In the past six months, have you accidently leaked urine? N  Do you have problems with loss of bowel control? N  Managing your Medications? N  Managing your Finances? N  Housekeeping or managing your Housekeeping? N  Some recent data might be hidden    Patient Care  Team: Tammi Sou, MD as PCP - General Raynelle Bring, MD as Consulting Physician (Urology) Minus Breeding, MD as Consulting Physician (Cardiology) End, Harrell Gave, MD as Consulting Physician (Cardiology) [place], Jennefer Bravo, MD as Referring Physician (Dermatology) Alexis Frock, MD as Consulting Physician (Urology)   Assessment:   This is a routine wellness examination for [place].  Exercise Activities and Dietary recommendations Current Exercise Habits: The patient does not participate in regular exercise at present(yard work; house maintenance/upgrades), Exercise limited by: None identified   Diet (meal preparation, eat out, water intake, caffeinated beverages, dairy products, fruits and vegetables): Drinks water and gatorade. Avoids soda.   Breakfast: yogurt, fruit and nuts; coffee (black, 1 cup) Dinner: spaghetti; stew; fish and vegetables.   Goals      Patient Stated   . <enter goal here> (pt-stated)     Maintain current health status by continuing to be active.       Other   . Patient Stated     Maintain current health.     . Patient Stated     Maintain current health by staying active.        Fall Risk Fall Risk  05/30/2018 05/03/2017 04/27/2016 09/23/2015  Falls in the past year? 0 No Yes No  Number falls in past yr: - - 1 -  Injury with Fall? - - No -  Follow up - - Falls prevention discussed -    Depression Screen PHQ 2/9 Scores 05/30/2018 05/03/2017 04/27/2016 09/23/2015  PHQ - 2 Score 0 0 0 0    Cognitive Function MMSE - Mini Mental State Exam 05/30/2018 05/03/2017  Orientation to time 5 5  Orientation to Place 5 5  Registration 3 3  Attention/ Calculation 5 3  Recall 2 3  Language- name 2 objects 2 2  Language- repeat 1 1  Language- follow 3 step command 3 3  Language- read & follow direction 1 1  Write a sentence 1 1  Copy design 1 1  Total score 29 28        Immunization History  Administered Date(s) Administered  . Influenza  Split 02/18/2012  . Influenza Whole 02/04/2010, 02/09/2011  . Influenza, High Dose Seasonal PF 02/12/2014, 03/25/2015, 01/31/2017, 01/28/2018  . Influenza,inj,Quad PF,6+ Mos 01/30/2013, 01/20/2016  . Pneumococcal Conjugate-13 03/19/2014  . Pneumococcal Polysaccharide-23 05/08/2007  . Td 05/07/2006  . Tdap 10/26/2016  . Zoster 10/31/2011     Screening Tests Health Maintenance  Topic Date Due  . TETANUS/TDAP  10/27/2026  . INFLUENZA VACCINE  Completed  . PNA vac Low Risk Adult  Completed        Plan:     Shingles vaccine at pharmacy.   Continue doing brain stimulating activities (puzzles, reading, adult coloring books, staying active) to keep memory sharp.    I have personally reviewed and noted the following in the patient's chart:   . Medical and social history . Use of alcohol, tobacco or illicit drugs  . Current medications and supplements . Functional ability and status . Nutritional status . Physical activity . Advanced directives . List of other physicians . Hospitalizations, surgeries, and ER visits in previous 12 months . Vitals . Screenings to include cognitive, depression, and falls . Referrals and appointments  In addition, I have reviewed and discussed with patient certain preventive protocols, quality metrics, and best practice recommendations. A written personalized care plan for preventive services as well as general preventive  health recommendations were provided to patient.     Gerilyn Nestle, RN  05/30/2018

## 2018-05-30 ENCOUNTER — Encounter: Payer: Self-pay | Admitting: Family Medicine

## 2018-05-30 ENCOUNTER — Ambulatory Visit (INDEPENDENT_AMBULATORY_CARE_PROVIDER_SITE_OTHER): Payer: 59 | Admitting: Family Medicine

## 2018-05-30 ENCOUNTER — Ambulatory Visit (INDEPENDENT_AMBULATORY_CARE_PROVIDER_SITE_OTHER): Payer: 59

## 2018-05-30 ENCOUNTER — Other Ambulatory Visit: Payer: Self-pay

## 2018-05-30 VITALS — BP 138/80 | HR 67 | Temp 97.6°F | Resp 16 | Ht 68.0 in | Wt 181.4 lb

## 2018-05-30 VITALS — BP 138/80 | HR 67 | Resp 18 | Ht 69.0 in | Wt 181.4 lb

## 2018-05-30 DIAGNOSIS — Z23 Encounter for immunization: Secondary | ICD-10-CM

## 2018-05-30 DIAGNOSIS — Z0000 Encounter for general adult medical examination without abnormal findings: Secondary | ICD-10-CM | POA: Diagnosis not present

## 2018-05-30 DIAGNOSIS — R7989 Other specified abnormal findings of blood chemistry: Secondary | ICD-10-CM | POA: Diagnosis not present

## 2018-05-30 DIAGNOSIS — K432 Incisional hernia without obstruction or gangrene: Secondary | ICD-10-CM | POA: Diagnosis not present

## 2018-05-30 DIAGNOSIS — E7800 Pure hypercholesterolemia, unspecified: Secondary | ICD-10-CM | POA: Diagnosis not present

## 2018-05-30 DIAGNOSIS — E663 Overweight: Secondary | ICD-10-CM

## 2018-05-30 LAB — LIPID PANEL
Cholesterol: 117 mg/dL (ref 0–200)
HDL: 38.2 mg/dL — ABNORMAL LOW (ref >39.00)
NONHDL: 78.72
TRIGLYCERIDES: 212 mg/dL — AB (ref 0.0–149.0)
Total CHOL/HDL Ratio: 3
VLDL: 42.4 mg/dL — ABNORMAL HIGH (ref 0.0–40.0)

## 2018-05-30 LAB — LDL CHOLESTEROL, DIRECT: Direct LDL: 55 mg/dL

## 2018-05-30 LAB — COMPREHENSIVE METABOLIC PANEL
ALT: 37 U/L (ref 0–53)
AST: 27 U/L (ref 0–37)
Albumin: 4.9 g/dL (ref 3.5–5.2)
Alkaline Phosphatase: 71 U/L (ref 39–117)
BUN: 14 mg/dL (ref 6–23)
CO2: 30 mEq/L (ref 19–32)
Calcium: 10.2 mg/dL (ref 8.4–10.5)
Chloride: 102 mEq/L (ref 96–112)
Creatinine, Ser: 1.22 mg/dL (ref 0.40–1.50)
GFR: 57.67 mL/min — ABNORMAL LOW (ref >60.00)
Glucose, Bld: 102 mg/dL — ABNORMAL HIGH (ref 70–99)
Potassium: 5.2 mEq/L — ABNORMAL HIGH (ref 3.5–5.1)
Sodium: 139 mEq/L (ref 135–145)
Total Bilirubin: 0.8 mg/dL (ref 0.2–1.2)
Total Protein: 7.5 g/dL (ref 6.0–8.3)

## 2018-05-30 MED ORDER — ZOSTER VAC RECOMB ADJUVANTED 50 MCG/0.5ML IM SUSR: 0.5000 mL | Freq: Once | INTRAMUSCULAR | 1 refills | Status: DC

## 2018-05-30 NOTE — Progress Notes (Signed)
AWV reviewed and agree. Signed:  Crissie Sickles, MD           05/30/2018

## 2018-05-30 NOTE — Patient Instructions (Addendum)
Shingles vaccine at pharmacy.   Continue doing brain stimulating activities (puzzles, reading, adult coloring books, staying active) to keep memory sharp.   Health Maintenance, Male A healthy lifestyle and preventive care is important for your health and wellness. Ask your health care provider about what schedule of regular examinations is right for you. What should I know about weight and diet? Eat a Healthy Diet  Eat plenty of vegetables, fruits, whole grains, low-fat dairy products, and lean protein.  Do not eat a lot of foods high in solid fats, added sugars, or salt.  Maintain a Healthy Weight Regular exercise can help you achieve or maintain a healthy weight. You should:  Do at least 150 minutes of exercise each week. The exercise should increase your heart rate and make you sweat (moderate-intensity exercise).  Do strength-training exercises at least twice a week. Watch Your Levels of Cholesterol and Blood Lipids  Have your blood tested for lipids and cholesterol every 5 years starting at 77 years of age. If you are at high risk for heart disease, you should start having your blood tested when you are 77 years old. You may need to have your cholesterol levels checked more often if: ? Your lipid or cholesterol levels are high. ? You are older than 77 years of age. ? You are at high risk for heart disease. What should I know about cancer screening? Many types of cancers can be detected early and may often be prevented. Lung Cancer  You should be screened every year for lung cancer if: ? You are a current smoker who has smoked for at least 30 years. ? You are a former smoker who has quit within the past 15 years.  Talk to your health care provider about your screening options, when you should start screening, and how often you should be screened. Colorectal Cancer  Routine colorectal cancer screening usually begins at 77 years of age and should be repeated every 5-10 years  until you are 77 years old. You may need to be screened more often if early forms of precancerous polyps or small growths are found. Your health care provider may recommend screening at an earlier age if you have risk factors for colon cancer.  Your health care provider may recommend using home test kits to check for hidden blood in the stool.  A small camera at the end of a tube can be used to examine your colon (sigmoidoscopy or colonoscopy). This checks for the earliest forms of colorectal cancer. Prostate and Testicular Cancer  Depending on your age and overall health, your health care provider may do certain tests to screen for prostate and testicular cancer.  Talk to your health care provider about any symptoms or concerns you have about testicular or prostate cancer. Skin Cancer  Check your skin from head to toe regularly.  Tell your health care provider about any new moles or changes in moles, especially if: ? There is a change in a mole's size, shape, or color. ? You have a mole that is larger than a pencil eraser.  Always use sunscreen. Apply sunscreen liberally and repeat throughout the day.  Protect yourself by wearing long sleeves, pants, a wide-brimmed hat, and sunglasses when outside. What should I know about heart disease, diabetes, and high blood pressure?  If you are 14-25 years of age, have your blood pressure checked every 3-5 years. If you are 86 years of age or older, have your blood pressure checked every year. You  should have your blood pressure measured twice-once when you are at a hospital or clinic, and once when you are not at a hospital or clinic. Record the average of the two measurements. To check your blood pressure when you are not at a hospital or clinic, you can use: ? An automated blood pressure machine at a pharmacy. ? A home blood pressure monitor.  Talk to your health care provider about your target blood pressure.  If you are between 67-79 years  old, ask your health care provider if you should take aspirin to prevent heart disease.  Have regular diabetes screenings by checking your fasting blood sugar level. ? If you are at a normal weight and have a low risk for diabetes, have this test once every three years after the age of 28. ? If you are overweight and have a high risk for diabetes, consider being tested at a younger age or more often.  A one-time screening for abdominal aortic aneurysm (AAA) by ultrasound is recommended for men aged 29-75 years who are current or former smokers. What should I know about preventing infection? Hepatitis B If you have a higher risk for hepatitis B, you should be screened for this virus. Talk with your health care provider to find out if you are at risk for hepatitis B infection. Hepatitis C Blood testing is recommended for:  Everyone born from [birth year] through 1964.  Anyone with known risk factors for hepatitis C. Sexually Transmitted Diseases (STDs)  You should be screened each year for STDs including gonorrhea and chlamydia if: ? You are sexually active and are younger than 77 years of age. ? You are older than 77 years of age and your health care provider tells you that you are at risk for this type of infection. ? Your sexual activity has changed since you were last screened and you are at an increased risk for chlamydia or gonorrhea. Ask your health care provider if you are at risk.  Talk with your health care provider about whether you are at high risk of being infected with HIV. Your health care provider may recommend a prescription medicine to help prevent HIV infection. What else can I do?  Schedule regular health, dental, and eye exams.  Stay current with your vaccines (immunizations).  Do not use any tobacco products, such as cigarettes, chewing tobacco, and e-cigarettes. If you need help quitting, ask your health care provider.  Limit alcohol intake to no more than 2 drinks per  day. One drink equals 12 ounces of beer, 5 ounces of wine, or 1 ounces of hard liquor.  Do not use street drugs.  Do not share needles.  Ask your health care provider for help if you need support or information about quitting drugs.  Tell your health care provider if you often feel depressed.  Tell your health care provider if you have ever been abused or do not feel safe at home. This information is not intended to replace advice given to you by your health care provider. Make sure you discuss any questions you have with your health care provider. Document Released: 10/20/2007 Document Revised: 12/21/2015 Document Reviewed: 01/25/2015 Elsevier Interactive Patient Education  2019 [employer].

## 2018-05-30 NOTE — Patient Instructions (Signed)

## 2018-05-30 NOTE — Progress Notes (Signed)
Office Note 05/30/2018  CC:  Chief Complaint  Patient presents with  . Annual Exam    Pt is fasting.    HPI:  William Montgomery is a 78 y.o. White male who is here for annual health maintenance exam.  He is physically active, but no formal exercise. Diet: working on avoiding high fat and high carb foods. Eyes and dental UTD.  He says he has had a gradually enlarging bulge in the area of one of his laparoscope incisions from when he got prostatectomy in 2017.  The bulge sometimes burns/hurts, esp when turning or lifting.  It will retract if he pushes it gently in.  He asks for surgery referral so this can be repaired.  Past Medical History:  Diagnosis Date  . Arthritis   . 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 BPH (benign prostatic hyperplasia)   . Colon polyps 07/2011   hyperplastic 07/25/2011  . Coronary artery disease 12/22/2007   Cath 03/2017-->  70 - 80% mid/distal circ stenosis and a small D1 80% stenosis and proximal stents in the prox and mid LAD.   Med mgmt rec'd-->nitrates caused HAs, ACE-I caused cough.  . DDD (degenerative disc disease)    s/p surgery; L/S spine MRI 05/2004 showed DDD/spondylosis with right L3 nerve root abutment, with L5/S1 surgical changes.  . Diverticulosis of sigmoid colon 07/25/11   Severe (endoscopy by Dr. Sharlett Iles)  . Erectile dysfunction   . Fatty liver disease, nonalcoholic 05/6071   mild transaminasemia (Hep B and C testing neg)  . Gallbladder polyp 2013   Noted 06/2011.  Stable on u/s f/u 10/2011.  . Gallstone 05/2018   Asymptomatic (picked up on CT abd/pelv).  . Hearing loss 2004   Dr. Tamala Julian, ENT  . History of prostate cancer 12/2014   Robot assisted radical prostatectomy 07/2015: lymph node involvement.  Plan is active surveillance as per urology: As of 03/2018 PSA undetectable.  Marland Kitchen HTN (hypertension)    ACE-I cough.  Fine on ARB.  Marland Kitchen Hyperlipidemia   . Nephrolithiasis    8 mm left distal ureteral stone 05/2018, got ureteroscopy with  holmium laser treatment.  . Pulmonary nodules    small, picked up on CT ab/pelv for abd pains/flank pains-->pt quit smoking 40 yrs ago.  Low risk for lung ca so no f/u CT needed.  . Tinnitus     Past Surgical History:  Procedure Laterality Date  . APPENDECTOMY  1953  . BACK SURGERY     C-Spine and L-Spine  . CARDIAC CATHETERIZATION    . CARDIOVASCULAR STRESS TEST  2012   Normal  . COLONOSCOPY  02/26/02; 07/2011   Colonoscopy by Dr. Rowe Pavy 2003 was normal (+hx of polyps (adenomatous?) prior), 07/2011 showed one hyperplastic polyp.  . CORONARY STENT PLACEMENT     x 2 (LAD)  . CYSTOSCOPY WITH RETROGRADE PYELOGRAM, URETEROSCOPY AND STENT PLACEMENT Left 05/10/2018   Procedure: CYSTOSCOPY WITH LEFT RETROGRADE PYELOGRAM, LEFT URETEROSCOPY AND LEFT URETERAL STENT PLACEMENT;  Surgeon: Alexis Frock, MD;  Location: [facility];  Service: Urology;  Laterality: Left;  . HOLMIUM LASER APPLICATION Left 11/04/624   Procedure: HOLMIUM LASER APPLICATION;  Surgeon: Alexis Frock, MD;  Location: [facility];  Service: Urology;  Laterality: Left;  . LEFT HEART CATH AND CORONARY ANGIOGRAPHY N/A 03/21/2017   Stents patent; mod circ dz, nothing for intervention--imdur added.  Procedure: LEFT HEART CATH AND CORONARY ANGIOGRAPHY;  Surgeon: Nelva Bush, MD;  Location: [facility];  Service: Cardiovascular;  Laterality:  N/A;  . LOBECTOMY     right upper lobectomy for blastomycosis  . LYMPHADENECTOMY Bilateral 07/14/2015   Procedure: LYMPHADENECTOMY;  Surgeon: Raynelle Bring, MD;  Location: [facility];  Service: Urology;  Laterality: Bilateral;  . PROSTATE BIOPSY  04/27/15   4 of 12 bx core's + prostate adenocarcinoma (tentative treatment plan is prostatectomy ? + other therapies (as of 05/18/15)  . ROBOT ASSISTED LAPAROSCOPIC RADICAL PROSTATECTOMY N/A 07/14/2015   One positive pelvic LN.  Procedure: XI ROBOTIC ASSISTED LAPAROSCOPIC RADICAL PROSTATECTOMY LEVEL 2;  Surgeon: Raynelle Bring, MD;  Location: [facility];  Service:  Urology;  Laterality: N/A;  . UMBILICAL HERNIA REPAIR  07/14/2015   Procedure: HERNIA REPAIR UMBILICAL ADULT;  Surgeon: Raynelle Bring, MD;  Location: [facility];  Service: Urology;;    Family History  Problem Relation Age of Onset  . Cervical cancer Mother        deceased  . Cirrhosis Father   . Colon cancer Neg Hx     Social History   Socioeconomic History  . Marital status: Married    Spouse name: Not on file  . Number of children: 2  . Years of education: Not on file  . Highest education level: Not on file  Occupational History  . Occupation: Retired  Scientific laboratory technician  . Financial resource strain: Not on file  . Food insecurity:    Worry: Not on file    Inability: Not on file  . Transportation needs:    Medical: Not on file    Non-medical: Not on file  Tobacco Use  . Smoking status: Former Smoker    Packs/day: 0.50    Years: 20.00    Pack years: 10.00    Types: Cigarettes    Last attempt to quit: 05/07/1976    Years since quitting: 42.0  . Smokeless tobacco: Former Systems developer    Types: Chew    Quit date: 05/07/1998  . Tobacco comment: Scoal  Substance and Sexual Activity  . Alcohol use: Yes    Comment: 1 beer every 6 months  . Drug use: No  . Sexual activity: Not on file  Lifestyle  . Physical activity:    Days per week: Not on file    Minutes per session: Not on file  . Stress: Not on file  Relationships  . Social connections:    Talks on phone: Not on file    Gets together: Not on file    Attends religious service: Not on file    Active member of club or organization: Not on file    Attends meetings of clubs or organizations: Not on file    Relationship status: Not on file  . Intimate partner violence:    Fear of current or ex partner: Not on file    Emotionally abused: Not on file    Physically abused: Not on file    Forced sexual activity: Not on file  Other Topics Concern  . Not on file  Social History Narrative   Married, 2 adult children ([place]).    Lives in [place].     Occ: Retired from [employer] work) at age 40.   Tob (smoke and chew) x 20 yrs, quit approx 1980s.   Alcohol: very rare.   One cup coffee each morning.   Exercise: walks 3 miles about 4 times per week.   Active lifestyle.    Outpatient Medications Prior to Visit  Medication Sig Dispense Refill  . amitriptyline (ELAVIL) 25  MG tablet Take 25 mg by mouth at bedtime.   3  . aspirin 81 MG tablet Take 81 mg every evening by mouth.     . celecoxib (CELEBREX) 200 MG capsule TAKE 1 CAPSULE (200 MG) BY MOUTH 2 TIMES A DAY AS NEEDED FOR PAIn (Patient taking differently: Take 200 mg by mouth daily as needed (pain.). ) 60 capsule 3  . Cholecalciferol (VITAMIN D3) 5000 units CAPS Take 5,000 Units every evening by mouth.    . Coenzyme Q10 (COQ10) 100 MG CAPS Take 100 mg every evening by mouth.    . losartan (COZAAR) 25 MG tablet TAKE 1 TABLET (25 MG TOTAL) BY MOUTH DAILY. 90 tablet 3  . Multiple Vitamin (MULTIVITAMIN WITH MINERALS) TABS tablet Take 1 tablet every evening by mouth.    . rosuvastatin (CRESTOR) 20 MG tablet TAKE 1 TABLET (20 MG TOTAL) BY MOUTH DAILY. (Patient taking differently: Take 20 mg by mouth every evening. ) 90 tablet 3  . nitroGLYCERIN (NITROSTAT) 0.4 MG SL tablet PLACE 1 TABLET UNDER THE TONGUE EVERY 5 MINUTES AS NEEDED FOR CHEST PAIN. (Patient not taking: Reported on 05/30/2018) 25 tablet 2  . ondansetron (ZOFRAN ODT) 4 MG disintegrating tablet Take 1 tablet (4 mg total) by mouth every 8 (eight) hours as needed for nausea or vomiting. (Patient not taking: Reported on 05/30/2018) 20 tablet 0  . oxyCODONE (ROXICODONE) 5 MG immediate release tablet Take 1 tablet (5 mg total) by mouth every 4 (four) hours as needed for severe pain. (Patient not taking: Reported on 05/30/2018) 15 tablet 0  . tamsulosin (FLOMAX) 0.4 MG CAPS capsule Take 1 capsule (0.4 mg total) by mouth daily. (Patient not taking: Reported on 05/30/2018) 30 capsule 0   No facility-administered  medications prior to visit.     Allergies  Allergen Reactions  . Ace Inhibitors Cough  . Imdur [Isosorbide Dinitrate] Other (See Comments)    Headache    ROS Review of Systems  Constitutional: Negative for appetite change, chills, fatigue and fever.  HENT: Negative for congestion, dental problem, ear pain and sore throat.   Eyes: Negative for discharge, redness and visual disturbance.  Respiratory: Negative for cough, chest tightness, shortness of breath and wheezing.   Cardiovascular: Negative for chest pain, palpitations and leg swelling.  Gastrointestinal: Negative for abdominal pain, blood in stool, diarrhea, nausea and vomiting.       RLQ focal protrusion at a small incision site  Genitourinary: Negative for difficulty urinating, dysuria, flank pain, frequency, hematuria and urgency.  Musculoskeletal: Positive for neck pain. Negative for arthralgias, back pain, joint swelling, myalgias and neck stiffness.  Skin: Negative for pallor and rash.  Neurological: Negative for dizziness, speech difficulty, weakness and headaches.  Hematological: Negative for adenopathy. Does not bruise/bleed easily.  Psychiatric/Behavioral: Negative for confusion and sleep disturbance. The patient is not nervous/anxious.     PE; Blood pressure 138/80, pulse 67, temperature 97.6 F (36.4 C), temperature source Oral, resp. rate 16, height 5' 8"  (1.727 m), weight 181 lb 6 oz (82.3 kg), SpO2 95 %. Gen: Alert, well appearing.  Patient is oriented to person, place, time, and situation. AFFECT: pleasant, lucid thought and speech. ENT: Ears: EACs clear, normal epithelium.  TMs with good light reflex and landmarks bilaterally.  Eyes: no injection, icteris, swelling, or exudate.  EOMI, PERRLA. Nose: no drainage or turbinate edema/swelling.  No injection or focal lesion.  Mouth: lips without lesion/swelling.  Oral mucosa pink and moist.  Dentition intact and without obvious caries or  gingival swelling.  Oropharynx  without erythema, exudate, or swelling.  Neck: supple/nontender.  No LAD, mass, or TM.  Carotid pulses 2+ bilaterally, without bruits. CV: RRR, no m/r/g.   LUNGS: CTA bilat, nonlabored resps, good aeration in all lung fields. ABD: soft, NT, ND, BS normal.  No hepatospenomegaly or mass.  No bruits. He has a tennis ball sized protrusion at RLQ incision that is nontender and non erythematous.  It is soft, retracts easily with gentle pressure when pt supine. EXT: no clubbing, cyanosis, or edema.  Musculoskeletal: no joint swelling, erythema, warmth, or tenderness.  ROM of all joints intact. Skin - no sores or suspicious lesions or rashes or color changes   Pertinent labs:  Lab Results  Component Value Date   TSH 3.080 03/15/2017   Lab Results  Component Value Date   WBC 7.5 05/08/2018   HGB 15.4 05/08/2018   HCT 45.7 05/08/2018   MCV 96.0 05/08/2018   PLT 164 05/08/2018   Lab Results  Component Value Date   CREATININE 1.64 (H) 05/08/2018   BUN 18 05/08/2018   NA 135 05/08/2018   K 4.1 05/08/2018   CL 99 05/08/2018   CO2 26 05/08/2018   Lab Results  Component Value Date   ALT 30 05/08/2018   AST 25 05/08/2018   ALKPHOS 72 05/08/2018   BILITOT 1.1 05/08/2018   Lab Results  Component Value Date   CHOL 124 11/25/2017   Lab Results  Component Value Date   HDL 38.80 (L) 11/25/2017   Lab Results  Component Value Date   LDLCALC 45 10/26/2016   Lab Results  Component Value Date   TRIG 215.0 (H) 11/25/2017   Lab Results  Component Value Date   CHOLHDL 3 11/25/2017   Lab Results  Component Value Date   PSA <0.015 07/20/2016   PSA 5.69 (H) 12/24/2014   PSA 4.03 (H) 09/16/2014    ASSESSMENT AND PLAN:   1) RLQ incisional hernia, growing and hurting/burning intermittently.  It is easily reducible. Pt desires surgical repair so I referred him to general surgeon today.  2) Health maintenance exam: Reviewed age and gender appropriate health maintenance issues  (prudent diet, regular exercise, health risks of tobacco and excessive alcohol, use of seatbelts, fire alarms in home, use of sunscreen).  Also reviewed age and gender appropriate health screening as well as vaccine recommendations. Vaccines: all UTD.  Shingrix discussed-->rx to be sent to pharmacy. Labs: recent elevated sCr in the context of kidney stone +treatment-->repeat lytes/cr today.  FLP for f/u hyperlipidemia today. Prostate ca screening: n/a-->hx of prostate ca and is followed by urology. Colon ca screening: last colonoscopy 2013 (hyperplastic polyp).  He had an adenomatous polyp 5 yrs prior to that.  Discussed repeat colonoscopy-->plans for repeat 2023.  An After Visit Summary was printed and given to the patient.  FOLLOW UP:  Return in about 6 months (around 11/28/2018) for routine chronic illness f/u.  Signed:  Crissie Sickles, MD           05/30/2018

## 2018-06-01 ENCOUNTER — Encounter: Payer: Self-pay | Admitting: Family Medicine

## 2018-06-02 ENCOUNTER — Encounter: Payer: Self-pay | Admitting: *Deleted

## 2018-06-02 DIAGNOSIS — N5201 Erectile dysfunction due to arterial insufficiency: Secondary | ICD-10-CM | POA: Diagnosis not present

## 2018-06-02 DIAGNOSIS — N393 Stress incontinence (female) (male): Secondary | ICD-10-CM | POA: Diagnosis not present

## 2018-06-02 DIAGNOSIS — C61 Malignant neoplasm of prostate: Secondary | ICD-10-CM | POA: Diagnosis not present

## 2018-06-02 DIAGNOSIS — N201 Calculus of ureter: Secondary | ICD-10-CM | POA: Diagnosis not present

## 2018-06-03 MED FILL — SHINGRIX 50 MCG SUS: 50 | 1 days supply | Qty: 1 | Fill #0

## 2018-07-09 ENCOUNTER — Telehealth: Payer: Self-pay

## 2018-07-09 ENCOUNTER — Ambulatory Visit: Payer: Self-pay | Admitting: Surgery

## 2018-07-09 DIAGNOSIS — K4090 Unilateral inguinal hernia, without obstruction or gangrene, not specified as recurrent: Secondary | ICD-10-CM | POA: Diagnosis not present

## 2018-07-09 DIAGNOSIS — K432 Incisional hernia without obstruction or gangrene: Secondary | ICD-10-CM | POA: Diagnosis not present

## 2018-07-09 NOTE — Telephone Encounter (Signed)
   [facility] Pre-operative Risk Assessment    Request for surgical clearance:  1. What type of surgery is being performed? Hernia repair  2. When is this surgery scheduled? TBD  3. What type of clearance is required (medical clearance vs. Pharmacy clearance to hold med vs. Both)? Both  4. Are there any medications that need to be held prior to surgery and how long? Aspirin  5. Practice name and name of physician performing surgery? [facility]     6. What is your office phone number [phone]   7.   What is your office fax number [phone]  8.   Anesthesia type   General   Kathyrn Lass 07/09/2018, 1:51 PM  _________________________________________________________________   (provider comments below)

## 2018-07-09 NOTE — Telephone Encounter (Signed)
   Primary Cardiologist:Wayden Hochrein, MD  Chart reviewed as part of pre-operative protocol coverage. Because of William Montgomery's past medical history and time since last visit, he/she will require a follow-up visit in order to better assess preoperative cardiovascular risk.  Pre-op covering staff: - Please schedule appointment and call patient to inform them. - Please contact requesting surgeon's office via preferred method (i.e, phone, fax) to inform them of need for appointment prior to surgery.  Dr. Telford Nab, can patient hold ASA if doing well during follow up? Please forward your response to [facility] PREOP.   Thank you  Leanor Kail, PA  07/09/2018, 2:51 PM

## 2018-07-09 NOTE — Telephone Encounter (Signed)
Pt is scheduled to see Jory Sims, NP, 07/16/2018

## 2018-07-09 NOTE — H&P (Signed)
[name] Documented: 07/09/2018 8:38 AM Location: [facility] Patient #: [id number] DOB: July 03, 1940 Married / Language: [place] / Race: White Male   History of Present Illness Ardeth Sportsman MD; 07/09/2018 9:10 AM) The patient is a 78 year old male who presents with an incisional hernia. Note for "Incisional hernia": ` ` ` Patient sent for surgical consultation at the request of Dr Milinda Cave  Chief Complaint: Incisional hernia right lower quadrant port site. ` ` The patient is a pleasant active male with stable coronary disease. He required a robotic prostatectomy by Dr. Heloise Purpura 2017. He had a periumbilical hernia which is primarily repaired at the time. Patient noticed bulging in his right lower abdomen and one of the robotic port sites. It is gradually gotten larger. Occasionally gets some burning and discomfort with it. 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 Becoming increasingly bothersome. He mentioned it to his primary care physician on his annual visit. Incisional hernia suspected. Surgical consultation offered.  Patient comes in today by himself. He moves his bowels once a day. Went up 25 flights of stairs yesterday without difficulty. Can walk at least a half hour without difficulty. He takes a baby aspirin but no other anticoagulants. He has some 80% stenosis of a few his coronary vessels by catheterization but sees Dr. Antoine Poche with cardiology. No major issues. Moves his bowels every day. Had an underwhelming colonoscopy 7 years ago. He had an appendectomy at age 84. No other abdominal surgeries. He is not a diabetic. He does not smoke. He is not on any suppressive medications. No issues of urinary incontinence or difficulty. No urinary tract infections.  (Review of systems as stated in this history (HPI) or in the review of systems. Otherwise all other 12 point ROS are  negative) ` ` `   Allergies Santiago Glad, CMA; 07/09/2018 8:40 AM) No Known Drug Allergies [07/09/2018]: Allergies Reconciled   Medication History Santiago Glad, CMA; 07/09/2018 8:41 AM) Rosuvastatin Calcium (20MG  Tablet, Oral) Active. Losartan Potassium (25MG  Tablet, Oral) Active. Aspirin (81MG  Tablet, Oral) Active. Coenzyme Q10 (100MG  Capsule, Oral) Active. Celecoxib (200MG  Capsule, Oral) Active. Amitriptyline HCl (25MG  Tablet, Oral) Active. Medications Reconciled  Vitals Santiago Glad CMA; 07/09/2018 8:40 AM) 07/09/2018 8:39 AM Weight: 180.2 lb Height: 68in Body Surface Area: 1.96 m Body Mass Index: 27.4 kg/m  Temp.: 61F  Pulse: 68 (Regular)  BP: 142/78 (Sitting, Left Arm, Standard)       Physical Exam Ardeth Sportsman MD; 07/09/2018 9:07 AM) General Mental Status-Alert. General Appearance-Not in acute distress, Not Sickly. Orientation-Oriented X3. Hydration-Well hydrated. Voice-Normal.  Integumentary Global Assessment Upon inspection and palpation of skin surfaces of the - Axillae: non-tender, no inflammation or ulceration, no drainage. and Distribution of scalp and body hair is normal. General Characteristics Temperature - normal warmth is noted.  Head and Neck Head-normocephalic, atraumatic with no lesions or palpable masses. Face Global Assessment - atraumatic, no absence of expression. Neck Global Assessment - no abnormal movements, no bruit auscultated on the right, no bruit auscultated on the left, no decreased range of motion, non-tender. Trachea-midline. Thyroid Gland Characteristics - non-tender.  Eye Eyeball - Left-Extraocular movements intact, No Nystagmus. Eyeball - Right-Extraocular movements intact, No Nystagmus. Cornea - Left-No Hazy. Cornea - Right-No Hazy. Sclera/Conjunctiva - Left-No scleral icterus, No Discharge. Sclera/Conjunctiva - Right-No scleral icterus, No Discharge. Pupil -  Left-Direct reaction to light normal. Pupil - Right-Direct reaction to light normal.  ENMT Ears  Pinna - Left - no drainage observed, no generalized tenderness observed. Right - no drainage observed, no generalized tenderness observed. Nose and Sinuses External Inspection of the Nose - no destructive lesion observed. Inspection of the nares - Left - quiet respiration. Right - quiet respiration. Mouth and Throat Lips - Upper Lip - no fissures observed, no pallor noted. Lower Lip - no fissures observed, no pallor noted. Nasopharynx - no discharge present. Oral Cavity/Oropharynx - Tongue - no dryness observed. Oral Mucosa - no cyanosis observed. Hypopharynx - no evidence of airway distress observed.  Chest and Lung Exam Inspection Movements - Normal and Symmetrical. Accessory muscles - No use of accessory muscles in breathing. Palpation Palpation of the chest reveals - Non-tender. Auscultation Breath sounds - Normal and Clear.  Cardiovascular Auscultation Rhythm - Regular. Murmurs & Other Heart Sounds - Auscultation of the heart reveals - No Murmurs and No Systolic Clicks.  Abdomen Inspection Inspection of the abdomen reveals - No Visible peristalsis and No Abnormal pulsations. Umbilicus - No Bleeding, No Urine drainage. Palpation/Percussion Palpation and Percussion of the abdomen reveal - Soft, Non Tender, No Rebound tenderness, No Rigidity (guarding) and No Cutaneous hyperesthesia. Note: Right lower quadrant 6 x 5 cm bulging reducing down to a smaller defect at his prior robotic port site.  Umbilical vertical incision. I can feel stitches but no obvious hernia there. Moderate diastases recti. No left-sided ports. Nontender.   Male Genitourinary Sexual Maturity Tanner 5 - Adult hair pattern and Adult penile size and shape. Note: Obvious right inguinal hernia. Possible impulse on left groin is well suspicious for small left inguinal hernia. Otherwise normal external male  genitalia. No testicular masses.   Peripheral Vascular Upper Extremity Inspection - Left - No Cyanotic nailbeds, Not Ischemic. Right - No Cyanotic nailbeds, Not Ischemic.  Neurologic Neurologic evaluation reveals -normal attention span and ability to concentrate, able to name objects and repeat phrases. Appropriate fund of knowledge , normal sensation and normal coordination. Mental Status Affect - not angry, not paranoid. Cranial Nerves-Normal Bilaterally. Gait-Normal.  Neuropsychiatric Mental status exam performed with findings of-able to articulate well with normal speech/language, rate, volume and coherence, thought content normal with ability to perform basic computations and apply abstract reasoning and no evidence of hallucinations, delusions, obsessions or homicidal/suicidal ideation.  Musculoskeletal Global Assessment Spine, Ribs and Pelvis - no instability, subluxation or laxity. Right Upper Extremity - no instability, subluxation or laxity.  Lymphatic Head & Neck  General Head & Neck Lymphatics: Bilateral - Description - No Localized lymphadenopathy. Axillary  General Axillary Region: Bilateral - Description - No Localized lymphadenopathy. Femoral & Inguinal  Generalized Femoral & Inguinal Lymphatics: Left - Description - No Localized lymphadenopathy. Right - Description - No Localized lymphadenopathy.    Assessment & Plan Ardeth Sportsman MD; 07/09/2018 9:06 AM) Sherald Hess HERNIA, WITHOUT OBSTRUCTION OR GANGRENE (K43.2) Impression: Right lower quadrant incisional hernia at port site from robotic prostatectomy. No evidence of any recurrent periumbilical hernia after a primary repair at the same time. Obvious right inguinal hernia and possible left inguinal hernia.  I think he would benefit from surgical repair. He has good exercise tolerance but does have a history coronary disease. I would like to get clearance from his cardiologist, Dr. Berna Bue. Patient sees  him rather regularly, so hopefully just a letter. Catheterization showed stable disease.  Overnight stay given the multiple hernia locations even though not particularly large. We will see.  He's hoping to get back to work within 2 weeks. I cautioned  that would be somewhat ambitious for at least light/moderate duty. At least 6 weeks before complete unrestricted activity. He understands. Current Plans The anatomy & physiology of the abdominal wall was discussed. The pathophysiology of hernias was discussed. Natural history risks without surgery including progeressive enlargement, pain, incarceration, & strangulation was discussed. Contributors to complications such as smoking, obesity, diabetes, prior surgery, etc were discussed.  I feel the risks of no intervention will lead to serious problems that outweigh the operative risks; therefore, I recommended surgery to reduce and repair the hernia. I explained laparoscopic techniques with possible need for an open approach. I noted the probable use of mesh to patch and/or buttress the hernia repair  Risks such as bleeding, infection, abscess, need for further treatment, heart attack, death, and other risks were discussed. I noted a good likelihood this will help address the problem. Goals of post-operative recovery were discussed as well. Possibility that this will not correct all symptoms was explained. I stressed the importance of low-impact activity, aggressive pain control, avoiding constipation, & not pushing through pain to minimize risk of post-operative chronic pain or injury. Possibility of reherniation especially with smoking, obesity, diabetes, immunosuppression, and other health conditions was discussed. We will work to minimize complications.  An educational handout further explaining the pathology & treatment options was given as well. Questions were answered. The patient expresses understanding & wishes to proceed with  surgery.  I recommended obtaining preoperative cardiac clearance. I am concerned about the health of the patient and the ability to tolerate the operation. Therefore, we will request clearance by cardiology to better assess operative risk & see if a reevaluation, further workup, etc is needed. Also recommendations on how medications such as for anticoagulation and blood pressure should be managed/held/restarted after surgery. RIGHT INGUINAL HERNIA (K40.90) Impression: Obvious right inguinal hernia. Possible contralateral left inguinal hernia as well. Recommended laparoscopic expiration repair of hernias found. Because he has incisional hernia in the right lower quadrant, most likely this will be a TAPP like approach. Current Plans The anatomy & physiology of the abdominal wall and pelvic floor was discussed. The pathophysiology of hernias in the inguinal and pelvic region was discussed. Natural history risks such as progressive enlargement, pain, incarceration, and strangulation was discussed. Contributors to complications such as smoking, obesity, diabetes, prior surgery, etc were discussed.  I feel the risks of no intervention will lead to serious problems that outweigh the operative risks; therefore, I recommended surgery to reduce and repair the hernia. I explained laparoscopic techniques with possible need for an open approach. I noted usual use of mesh to patch and/or buttress hernia repair  Risks such as bleeding, infection, abscess, need for further treatment, heart attack, death, and other risks were discussed. I noted a good likelihood this will help address the problem. Goals of post-operative recovery were discussed as well. Possibility that this will not correct all symptoms was explained. I stressed the importance of low-impact activity, aggressive pain control, avoiding constipation, & not pushing through pain to minimize risk of post-operative chronic pain or injury.  Possibility of reherniation was discussed. We will work to minimize complications.  An educational handout further explaining the pathology & treatment options was given as well. Questions were answered. The patient expresses understanding & wishes to proceed with surgery.  PREOP - [facility] - ENCOUNTER FOR PREOPERATIVE EXAMINATION FOR GENERAL SURGICAL PROCEDURE (Z01.818) Current Plans You are being scheduled for surgery- Our schedulers will call you.  You should hear from our office's scheduling department  within 5 working days about the location, date, and time of surgery. We try to make accommodations for patient's preferences in scheduling surgery, but sometimes the OR schedule or the surgeon's schedule prevents [place] from making those accommodations.  If you have not heard from our office [phone]) in 5 working days, call the office and ask for your surgeon's nurse.  If you have other questions about your diagnosis, plan, or surgery, call the office and ask for your surgeon's nurse.  Written instructions provided Pt Education - [facility] Hernia Post-Op HCI (Jaiven Graveline): discussed with patient and provided information. Pt Education - [facility] Pain Control (Arne Schlender) Pt Education - Pamphlet Given - Laparoscopic Hernia Repair: discussed with patient and provided information. Pt Education - [facility] Mesh education: discussed with patient and provided information.   Ardeth Sportsman, MD, FACS, MASCRS Gastrointestinal and Minimally Invasive Surgery    [address], [address] [place], [place] [address] [phone] Main / Paging [phone] Fax

## 2018-07-16 ENCOUNTER — Ambulatory Visit: Payer: 59 | Admitting: Adult Health

## 2018-07-18 ENCOUNTER — Encounter (INDEPENDENT_AMBULATORY_CARE_PROVIDER_SITE_OTHER): Payer: 59 | Admitting: Ophthalmology

## 2018-07-18 ENCOUNTER — Other Ambulatory Visit: Payer: Self-pay

## 2018-07-18 DIAGNOSIS — H353112 Nonexudative age-related macular degeneration, right eye, intermediate dry stage: Secondary | ICD-10-CM

## 2018-07-18 DIAGNOSIS — H2513 Age-related nuclear cataract, bilateral: Secondary | ICD-10-CM

## 2018-07-18 DIAGNOSIS — H43813 Vitreous degeneration, bilateral: Secondary | ICD-10-CM

## 2018-07-24 ENCOUNTER — Telehealth: Payer: Self-pay | Admitting: Physician Assistant

## 2018-07-24 NOTE — Telephone Encounter (Signed)
   Primary Cardiologist:  Minus Breeding, MD   Patient contacted.  History reviewed.  No symptoms to suggest any unstable cardiac conditions.  Based on discussion, with current pandemic situation, we will be postponing this appointment for @PATIENTNAME @.  If symptoms change, he has been instructed to contact our office.   Routing to C19 CANCEL pool for tracking (P CV DIV CV19 CANCEL) and assigning priority (1 = 4-6 wks, 2 = 6-12 wks, 3 = >12 wks).  [place], [place]  07/24/2018 2:56 PM         .

## 2018-07-25 NOTE — Telephone Encounter (Signed)
Pt already been scheduled to see Dr. Percival Spanish 4/202020. Will take out of the Covid 19 cancel pool.

## 2018-07-28 ENCOUNTER — Other Ambulatory Visit: Payer: Self-pay | Admitting: Family Medicine

## 2018-07-28 MED ORDER — CELECOXIB 200 MG PO CAPS: ORAL_CAPSULE | ORAL | 3 refills | Status: DC

## 2018-07-28 MED ORDER — AMITRIPTYLINE HCL 25 MG PO TABS: 25.0000 mg | ORAL_TABLET | Freq: Every day | ORAL | 3 refills | Status: DC

## 2018-07-28 NOTE — Telephone Encounter (Signed)
RF request for amitriptyline 25 mg - It doesn't look like this RX was ever filled by DR McGowen. Also RF request for celebrex 200 mg.  Please advise if Okay to fill both RX's.   Thanks.

## 2018-07-28 NOTE — Telephone Encounter (Signed)
I have pended these RF's but it is telling me to change the pharmacy to [facility] b/c that's where the requests were sent from.  I can't figure out how to do this.  Help??

## 2018-07-29 ENCOUNTER — Ambulatory Visit: Payer: 59 | Admitting: Cardiology

## 2018-07-29 MED FILL — AMITRIPTYLINE HCL 25 MG TAB: 25 | 90 days supply | Qty: 90 | Fill #0

## 2018-07-29 MED FILL — CELECOXIB 200 MG CAP: 200 | 30 days supply | Qty: 60 | Fill #0

## 2018-08-11 MED FILL — ROSUVASTATIN CALCIUM 20 MG: 20 | 90 days supply | Qty: 90 | Fill #3

## 2018-08-11 MED FILL — LOSARTAN POTASSIUM 25 MG TA: 25 | 90 days supply | Qty: 90 | Fill #1

## 2018-08-19 ENCOUNTER — Telehealth: Payer: Self-pay

## 2018-08-19 NOTE — Telephone Encounter (Signed)
LEFT MESSAGE FOR PT TO CALL BACK FOR APPT CHANGE TO VIRTUAL

## 2018-08-20 NOTE — Telephone Encounter (Signed)
Follow up  ° ° °Patient is returning call.  °

## 2018-08-21 NOTE — Telephone Encounter (Signed)
Pt aware of his virtual visit 04/20

## 2018-08-22 ENCOUNTER — Encounter: Payer: Self-pay | Admitting: Family Medicine

## 2018-08-22 ENCOUNTER — Telehealth: Payer: Self-pay | Admitting: Cardiology

## 2018-08-22 NOTE — Telephone Encounter (Signed)
LVM to pre reg. 08-22-18 ST

## 2018-08-22 NOTE — Telephone Encounter (Signed)
New Message ° ° ° °Pt is returning call  ° ° ° °Please call back  °

## 2018-08-25 ENCOUNTER — Telehealth: Payer: 59 | Admitting: Cardiology

## 2018-08-28 ENCOUNTER — Telehealth: Payer: Self-pay | Admitting: Cardiology

## 2018-08-28 NOTE — Telephone Encounter (Signed)
Follow Up:   Pt called and said he did not want a Virtual.Pt did make an appt for August.

## 2018-08-28 NOTE — Telephone Encounter (Signed)
LVM for patient to reschedule virtual visit.

## 2018-09-26 DIAGNOSIS — C61 Malignant neoplasm of prostate: Secondary | ICD-10-CM | POA: Diagnosis not present

## 2018-10-03 DIAGNOSIS — C61 Malignant neoplasm of prostate: Secondary | ICD-10-CM | POA: Diagnosis not present

## 2018-10-08 ENCOUNTER — Encounter: Payer: Self-pay | Admitting: Family Medicine

## 2018-10-23 MED FILL — SHINGRIX 50 MCG SUS: 50 | 1 days supply | Qty: 1 | Fill #1

## 2018-11-07 ENCOUNTER — Other Ambulatory Visit: Payer: Self-pay | Admitting: Cardiology

## 2018-11-07 MED FILL — LOSARTAN POTASSIUM 25 MG TA: 25 | 90 days supply | Qty: 90 | Fill #2

## 2018-11-07 MED FILL — AMITRIPTYLINE HCL 25 MG TAB: 25 | 90 days supply | Qty: 90 | Fill #0

## 2018-11-07 MED FILL — CELECOXIB 200 MG CAP: 200 | 30 days supply | Qty: 60 | Fill #0

## 2018-11-10 MED FILL — ROSUVASTATIN CALCIUM 20 MG: 20 | 90 days supply | Qty: 90 | Fill #0

## 2018-11-10 NOTE — Telephone Encounter (Signed)
Rx(s) sent to pharmacy electronically.  

## 2018-11-11 DIAGNOSIS — H2513 Age-related nuclear cataract, bilateral: Secondary | ICD-10-CM | POA: Diagnosis not present

## 2018-11-11 DIAGNOSIS — H3562 Retinal hemorrhage, left eye: Secondary | ICD-10-CM | POA: Diagnosis not present

## 2018-11-11 DIAGNOSIS — H353131 Nonexudative age-related macular degeneration, bilateral, early dry stage: Secondary | ICD-10-CM | POA: Diagnosis not present

## 2018-11-11 DIAGNOSIS — H40013 Open angle with borderline findings, low risk, bilateral: Secondary | ICD-10-CM | POA: Diagnosis not present

## 2018-11-28 ENCOUNTER — Other Ambulatory Visit: Payer: Self-pay

## 2018-11-28 ENCOUNTER — Encounter: Payer: Self-pay | Admitting: Family Medicine

## 2018-11-28 ENCOUNTER — Ambulatory Visit (INDEPENDENT_AMBULATORY_CARE_PROVIDER_SITE_OTHER): Payer: 59 | Admitting: Family Medicine

## 2018-11-28 ENCOUNTER — Encounter: Payer: Self-pay | Admitting: Gastroenterology

## 2018-11-28 VITALS — BP 122/73 | HR 63 | Temp 98.1°F | Resp 17 | Ht 68.0 in | Wt 168.0 lb

## 2018-11-28 DIAGNOSIS — E7800 Pure hypercholesterolemia, unspecified: Secondary | ICD-10-CM

## 2018-11-28 DIAGNOSIS — E663 Overweight: Secondary | ICD-10-CM

## 2018-11-28 DIAGNOSIS — K529 Noninfective gastroenteritis and colitis, unspecified: Secondary | ICD-10-CM

## 2018-11-28 DIAGNOSIS — I10 Essential (primary) hypertension: Secondary | ICD-10-CM | POA: Diagnosis not present

## 2018-11-28 DIAGNOSIS — Z1211 Encounter for screening for malignant neoplasm of colon: Secondary | ICD-10-CM

## 2018-11-28 DIAGNOSIS — K760 Fatty (change of) liver, not elsewhere classified: Secondary | ICD-10-CM | POA: Diagnosis not present

## 2018-11-28 DIAGNOSIS — N182 Chronic kidney disease, stage 2 (mild): Secondary | ICD-10-CM | POA: Diagnosis not present

## 2018-11-28 LAB — LIPID PANEL
Cholesterol: 112 mg/dL (ref 0–200)
HDL: 39.8 mg/dL (ref >39.00)
LDL Cholesterol: 45 mg/dL (ref 0–99)
NonHDL: 72.5
Total CHOL/HDL Ratio: 3
Triglycerides: 138 mg/dL (ref 0.0–149.0)
VLDL: 27.6 mg/dL (ref 0.0–40.0)

## 2018-11-28 LAB — COMPREHENSIVE METABOLIC PANEL
ALT: 23 U/L (ref 0–53)
AST: 21 U/L (ref 0–37)
Albumin: 4.9 g/dL (ref 3.5–5.2)
Alkaline Phosphatase: 92 U/L (ref 39–117)
BUN: 14 mg/dL (ref 6–23)
CO2: 29 mEq/L (ref 19–32)
Calcium: 9.8 mg/dL (ref 8.4–10.5)
Chloride: 103 mEq/L (ref 96–112)
Creatinine, Ser: 1.02 mg/dL (ref 0.40–1.50)
GFR: 70.82 mL/min (ref >60.00)
Glucose, Bld: 88 mg/dL (ref 70–99)
Potassium: 4.9 mEq/L (ref 3.5–5.1)
Sodium: 139 mEq/L (ref 135–145)
Total Bilirubin: 0.8 mg/dL (ref 0.2–1.2)
Total Protein: 7.2 g/dL (ref 6.0–8.3)

## 2018-11-28 MED ORDER — HYOSCYAMINE SULFATE 0.125 MG PO TABS: ORAL_TABLET | ORAL | 1 refills | Status: DC

## 2018-11-28 MED FILL — HYOSCYAMINE SULF 0.125 MG T: 0.125 | 3 days supply | Qty: 30 | Fill #0

## 2018-11-28 NOTE — Progress Notes (Signed)
OFFICE VISIT  11/28/2018   CC:  Chief Complaint  Patient presents with  . Hypertension    has not taken bp medication today. checks bp at home.    HPI:    Patient is a 78 y.o. Caucasian male who presents for 6 mo f/u HTN, HLD, and CRI II/III. He has RLQ incisional hernia as well as bilat inguinal hernias.  Got surgical eval 07/2018 and plan for repair AFTER cardiac clearance obtained.  He has appt in September to get discuss with cardiology.  He has stable CAD managed by Dr. Percival Spanish.  He is on ASA, statin, and ARB.  Has done well with trying to lose wt. 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  Taking some medium chain triglycerides. However, he has also had problem with loose BMs for about 8 mo or so.  Lots of gas.  Seems to be strong postprandial urge. No abd pain, no nausea or bloating.  Does not matter what he eats, sx's are the same.  No melena or pus or BRB.  No fevers. "Lots of gas and liquid comes out" each BM, sometimes several per day. He got a ureteral stent for a stone back in 05/2018 and he says this loose bowel problem was already starting by that time.     ROS: no CP, no SOB, no wheezing, no cough, no dizziness, no HAs, no rashes, no melena/hematochezia.  No polyuria or polydipsia.  No myalgias or arthralgias.   Past Medical History:  Diagnosis Date  . Arthritis   . 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 BPH (benign prostatic hyperplasia)   . Chronic renal insufficiency, stage 2 (mild)    borderlines II/III  . Colon polyps 07/2011   hyperplastic 07/25/2011  . Coronary artery disease 12/22/2007   Cath 03/2017-->  70 - 80% mid/distal circ stenosis and a small D1 80% stenosis and proximal stents in the prox and mid LAD.   Med mgmt rec'd-->nitrates caused HAs, ACE-I caused cough.  . DDD (degenerative disc disease)    s/p surgery; L/S spine MRI 05/2004 showed DDD/spondylosis with right L3 nerve root abutment, with L5/S1 surgical changes.  .  Diverticulosis of sigmoid colon 07/25/11   Severe (endoscopy by Dr. Sharlett Iles)  . Erectile dysfunction   . Gallbladder polyp 2013   Noted 06/2011.  Stable on u/s f/u 10/2011.  . Gallstone 05/2018   Asymptomatic (picked up on CT abd/pelv).  . Hearing loss 2004   Dr. Tamala Julian, ENT  . History of prostate cancer 12/2014   Robot assisted radical prostatectomy 07/2015: lymph node involvement.  Plan is active surveillance as per urology: As of  09/2018 PSA undetectable.  Marland Kitchen HTN (hypertension)    ACE-I cough.  Fine on ARB.  Marland Kitchen Hyperlipidemia   . NASH (nonalcoholic steatohepatitis) 06/2011   mild transaminasemia (Hep B and C testing neg)  . Pulmonary nodules    small, picked up on CT ab/pelv for abd pains/flank pains-->pt quit smoking 40 yrs ago.  Low risk for lung ca so no f/u CT needed.  . Tinnitus   . Ureterolithiasis    8 mm left distal ureteral stone 05/2018, got ureteroscopy with holmium laser treatment.    Past Surgical History:  Procedure Laterality Date  . APPENDECTOMY  1953  . BACK SURGERY     C-Spine and L-Spine  . CARDIAC CATHETERIZATION    . CARDIOVASCULAR STRESS TEST  2012   Normal  . COLONOSCOPY  02/26/02;  07/2011   Colonoscopy by Dr. Rowe Pavy 2003 was normal (+hx of polyps (adenomatous?) prior), 07/2011 showed one hyperplastic polyp.  . CORONARY STENT PLACEMENT     x 2 (LAD)  . CYSTOSCOPY WITH RETROGRADE PYELOGRAM, URETEROSCOPY AND STENT PLACEMENT Left 05/10/2018   Procedure: CYSTOSCOPY WITH LEFT RETROGRADE PYELOGRAM, LEFT URETEROSCOPY AND LEFT URETERAL STENT PLACEMENT;  Surgeon: Alexis Frock, MD;  Location: [facility];  Service: Urology;  Laterality: Left;  . HOLMIUM LASER APPLICATION Left 05/12/1094   Procedure: HOLMIUM LASER APPLICATION;  Surgeon: Alexis Frock, MD;  Location: [facility];  Service: Urology;  Laterality: Left;  . LEFT HEART CATH AND CORONARY ANGIOGRAPHY N/A 03/21/2017   Stents patent; mod circ dz, nothing for intervention--imdur added.  Procedure: LEFT HEART CATH AND  CORONARY ANGIOGRAPHY;  Surgeon: Nelva Bush, MD;  Location: [facility];  Service: Cardiovascular;  Laterality: N/A;  . LOBECTOMY     right upper lobectomy for blastomycosis  . LYMPHADENECTOMY Bilateral 07/14/2015   Procedure: LYMPHADENECTOMY;  Surgeon: Raynelle Bring, MD;  Location: [facility];  Service: Urology;  Laterality: Bilateral;  . PROSTATE BIOPSY  04/27/15   4 of 12 bx core's + prostate adenocarcinoma (tentative treatment plan is prostatectomy ? + other therapies (as of 05/18/15)  . ROBOT ASSISTED LAPAROSCOPIC RADICAL PROSTATECTOMY N/A 07/14/2015   One positive pelvic LN.  Procedure: XI ROBOTIC ASSISTED LAPAROSCOPIC RADICAL PROSTATECTOMY LEVEL 2;  Surgeon: Raynelle Bring, MD;  Location: [facility];  Service: Urology;  Laterality: N/A;  . UMBILICAL HERNIA REPAIR  07/14/2015   Procedure: HERNIA REPAIR UMBILICAL ADULT;  Surgeon: Raynelle Bring, MD;  Location: [facility];  Service: Urology;;    Outpatient Medications Prior to Visit  Medication Sig Dispense Refill  . amitriptyline (ELAVIL) 25 MG tablet Take 1 tablet (25 mg total) by mouth at bedtime. 180 tablet 3  . aspirin 81 MG tablet Take 81 mg every evening by mouth.     . celecoxib (CELEBREX) 200 MG capsule TAKE 1 CAPSULE (200 MG) BY MOUTH 2 TIMES A DAY AS NEEDED FOR PAIn 60 capsule 3  . Cholecalciferol (VITAMIN D3) 5000 units CAPS Take 5,000 Units every evening by mouth.    . Coenzyme Q10 (COQ10) 100 MG CAPS Take 100 mg every evening by mouth.    . rosuvastatin (CRESTOR) 20 MG tablet Take 1 tablet (20 mg total) by mouth daily. MUST KEEP APPOINTMENT 01/23/19 WITH DR [facility] FOR FUTURE REFILLS 90 tablet 0  . losartan (COZAAR) 25 MG tablet TAKE 1 TABLET (25 MG TOTAL) BY MOUTH DAILY. 90 tablet 3  . nitroGLYCERIN (NITROSTAT) 0.4 MG SL tablet PLACE 1 TABLET UNDER THE TONGUE EVERY 5 MINUTES AS NEEDED FOR CHEST PAIN. (Patient not taking: Reported on 05/30/2018) 25 tablet 2  . Multiple Vitamin (MULTIVITAMIN WITH MINERALS) TABS tablet Take 1 tablet  every evening by mouth.     No facility-administered medications prior to visit.     Allergies  Allergen Reactions  . Ace Inhibitors Cough  . Imdur [Isosorbide Dinitrate] Other (See Comments)    Headache    ROS As per HPI  PE: Blood pressure 122/73, pulse 63, temperature 98.1 F (36.7 C), temperature source Temporal, resp. rate 17, height 5' 8"  (1.727 m), weight 168 lb (76.2 kg), SpO2 98 %. Body mass index is 25.54 kg/m.  Gen: Alert, well appearing.  Patient is oriented to person, place, time, and situation. AFFECT: pleasant, lucid thought and speech. EAV:WUJW: no injection, icteris, swelling, or exudate.  EOMI, PERRLA. Mouth: lips without lesion/swelling.  Oral mucosa  pink and moist. Oropharynx without erythema, exudate, or swelling.  CV: RRR, no m/r/g.   LUNGS: CTA bilat, nonlabored resps, good aeration in all lung fields. ABD: soft, NT, ND, BS normal.  R LQ reducible incisional hernia.  No hepatospenomegaly or mass.  No bruits. EXT: no clubbing or cyanosis.  no edema.    LABS:  Lab Results  Component Value Date   TSH 3.080 03/15/2017   Lab Results  Component Value Date   WBC 7.5 05/08/2018   HGB 15.4 05/08/2018   HCT 45.7 05/08/2018   MCV 96.0 05/08/2018   PLT 164 05/08/2018   Lab Results  Component Value Date   CREATININE 1.22 05/30/2018   BUN 14 05/30/2018   NA 139 05/30/2018   K 5.2 (H) 05/30/2018   CL 102 05/30/2018   CO2 30 05/30/2018   Lab Results  Component Value Date   ALT 37 05/30/2018   AST 27 05/30/2018   ALKPHOS 71 05/30/2018   BILITOT 0.8 05/30/2018   Lab Results  Component Value Date   CHOL 117 05/30/2018   Lab Results  Component Value Date   HDL 38.20 (L) 05/30/2018   Lab Results  Component Value Date   LDLCALC 45 10/26/2016   Lab Results  Component Value Date   TRIG 212.0 (H) 05/30/2018   Lab Results  Component Value Date   CHOLHDL 3 05/30/2018   Lab Results  Component Value Date   PSA <0.015 07/20/2016   PSA 5.69  (H) 12/24/2014   PSA 4.03 (H) 09/16/2014    IMPRESSION AND PLAN:  1) HTN, HLD, CRI: all stable. CMET, FLP today.  2) Postprandial diarrhea x approx 8 mo-->functional-type sx's. Started levsin and metamucil today. He is very worried about this problem + feels like he needs a colonoscopy now rather than wait a few years for his next "routine" repeat colonoscopy for colon ca screening. He asks to be referred to GI for this, so I have ordered this today.  3) RLQ incisional hernia and inguinal hernias: he will get cardiac clearance in September and proceed with surgery with Dr. Johney Maine after that.  An After Visit Summary was printed and given to the patient.  FOLLOW UP: Return in about 6 months (around 05/31/2019) for annual CPE (fasting).  Signed:  Crissie Sickles, MD           11/28/2018

## 2018-11-28 NOTE — Patient Instructions (Signed)
Take 1 dose of metamucil every night.  Try the new med I prescribed today called hyoscyamine: take 1-2 tabs about 30 min prior to each meal.

## 2018-12-17 ENCOUNTER — Ambulatory Visit: Payer: 59 | Admitting: Cardiology

## 2018-12-29 ENCOUNTER — Other Ambulatory Visit: Payer: Self-pay

## 2018-12-29 ENCOUNTER — Encounter: Payer: Self-pay | Admitting: Gastroenterology

## 2018-12-29 ENCOUNTER — Ambulatory Visit (INDEPENDENT_AMBULATORY_CARE_PROVIDER_SITE_OTHER): Payer: 59 | Admitting: Gastroenterology

## 2018-12-29 VITALS — BP 120/80 | HR 59 | Temp 98.1°F | Ht 68.0 in | Wt 169.0 lb

## 2018-12-29 DIAGNOSIS — R140 Abdominal distension (gaseous): Secondary | ICD-10-CM

## 2018-12-29 DIAGNOSIS — K5730 Diverticulosis of large intestine without perforation or abscess without bleeding: Secondary | ICD-10-CM | POA: Diagnosis not present

## 2018-12-29 DIAGNOSIS — Z860100 Personal history of colon polyps, unspecified: Secondary | ICD-10-CM

## 2018-12-29 NOTE — Progress Notes (Signed)
[facility] Consult Note:  History: William Montgomery 12/29/2018  Referring provider: Tammi Sou, MD  Reason for consult/chief complaint: Diarrhea (Needs to discuss having a colonoscopy ), Gas, and Bloated   Subjective  HPI:  This is a very pleasant 78 year old man referred by primary care for gas and bloating and history of colon polyps. William Montgomery recalls that last year his PCP recommended he take probiotics, but he cannot recall exactly why.  He is tried multiple preparations at different doses, and says they all caused bloating and gas.  The symptoms resolved when he stopped taking them. His stool is formed, though sometimes "pasty".  This might cause difficulty "getting clean" after toileting.  He uses moist toilet wipes and thought he felt an abnormality there and it could be hemorrhoids. William Montgomery was concerned that there was a polyp on his last colonoscopy and thought he should have a follow-up procedure. He denies rectal bleeding, his appetite is good and weight stable.    Colon Sharlett Iles 07/2011- severe diverticulosis and hyperplastic rectal polyp  ROS:  Review of Systems  Constitutional: Negative for appetite change and unexpected weight change.  HENT: Negative for mouth sores and voice change.   Eyes: Negative for pain and redness.  Respiratory: Negative for cough and shortness of breath.   Cardiovascular: Negative for chest pain and palpitations.  Genitourinary: Negative for dysuria and hematuria.  Musculoskeletal: Positive for arthralgias. Negative for myalgias.  Skin: Negative for pallor and rash.  Neurological: Negative for weakness and headaches.  Hematological: Negative for adenopathy.     Past Medical History: Past Medical History:  Diagnosis Date  . Arthritis   . 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 BPH (benign prostatic hyperplasia)   . Chronic renal insufficiency, stage 2 (mild)    borderlines II/III  . Colon polyps 07/2011   hyperplastic 07/25/2011  . Coronary artery disease 12/22/2007   Cath 03/2017-->  70 - 80% mid/distal circ stenosis and a small D1 80% stenosis and proximal stents in the prox and mid LAD.   Med mgmt rec'd-->nitrates caused HAs, ACE-I caused cough.  . DDD (degenerative disc disease)    s/p surgery; L/S spine MRI 05/2004 showed DDD/spondylosis with right L3 nerve root abutment, with L5/S1 surgical changes.  . Diverticulosis of sigmoid colon 07/25/11   Severe (endoscopy by Dr. Sharlett Iles)  . Erectile dysfunction   . Gallbladder polyp 2013   Noted 06/2011.  Stable on u/s f/u 10/2011.  . Gallstone 05/2018   Asymptomatic (picked up on CT abd/pelv).  . Hearing loss 2004   Dr. Tamala Julian, ENT  . History of prostate cancer 12/2014   Robot assisted radical prostatectomy 07/2015: lymph node involvement.  Plan is active surveillance as per urology: As of  09/2018 PSA undetectable.  Marland Kitchen HTN (hypertension)    ACE-I cough.  Fine on ARB.  Marland Kitchen Hyperlipidemia   . NASH (nonalcoholic steatohepatitis) 06/2011   mild transaminasemia (Hep B and C testing neg)  . Pulmonary nodules    small, picked up on CT ab/pelv for abd pains/flank pains-->pt quit smoking 40 yrs ago.  Low risk for lung ca so no f/u CT needed.  . Tinnitus   . Ureterolithiasis    8 mm left distal ureteral stone 05/2018, got ureteroscopy with holmium laser treatment.     Past Surgical History: Past Surgical History:  Procedure Laterality Date  . APPENDECTOMY  1953  . BACK SURGERY     C-Spine and L-Spine  . CARDIAC CATHETERIZATION    .  CARDIOVASCULAR STRESS TEST  2012   Normal  . COLONOSCOPY  02/26/02; 07/2011   Colonoscopy by Dr. Rowe Pavy 2003 was normal (+hx of polyps (adenomatous?) prior), 07/2011 showed one hyperplastic polyp.  . CORONARY STENT PLACEMENT     x 2 (LAD)  . CYSTOSCOPY WITH RETROGRADE PYELOGRAM, URETEROSCOPY AND STENT PLACEMENT Left 05/10/2018   Procedure: CYSTOSCOPY WITH LEFT RETROGRADE PYELOGRAM, LEFT URETEROSCOPY AND LEFT URETERAL STENT  PLACEMENT;  Surgeon: Alexis Frock, MD;  Location: [facility];  Service: Urology;  Laterality: Left;  . HOLMIUM LASER APPLICATION Left 10/08/6801   Procedure: HOLMIUM LASER APPLICATION;  Surgeon: Alexis Frock, MD;  Location: [facility];  Service: Urology;  Laterality: Left;  . LEFT HEART CATH AND CORONARY ANGIOGRAPHY N/A 03/21/2017   Stents patent; mod circ dz, nothing for intervention--imdur added.  Procedure: LEFT HEART CATH AND CORONARY ANGIOGRAPHY;  Surgeon: Nelva Bush, MD;  Location: [facility];  Service: Cardiovascular;  Laterality: N/A;  . LOBECTOMY     right upper lobectomy for blastomycosis  . LYMPHADENECTOMY Bilateral 07/14/2015   Procedure: LYMPHADENECTOMY;  Surgeon: Raynelle Bring, MD;  Location: [facility];  Service: Urology;  Laterality: Bilateral;  . PROSTATE BIOPSY  04/27/15   4 of 12 bx core's + prostate adenocarcinoma (tentative treatment plan is prostatectomy ? + other therapies (as of 05/18/15)  . ROBOT ASSISTED LAPAROSCOPIC RADICAL PROSTATECTOMY N/A 07/14/2015   One positive pelvic LN.  Procedure: XI ROBOTIC ASSISTED LAPAROSCOPIC RADICAL PROSTATECTOMY LEVEL 2;  Surgeon: Raynelle Bring, MD;  Location: [facility];  Service: Urology;  Laterality: N/A;  . UMBILICAL HERNIA REPAIR  07/14/2015   Procedure: HERNIA REPAIR UMBILICAL ADULT;  Surgeon: Raynelle Bring, MD;  Location: [facility];  Service: Urology;;     Family History: Family History  Problem Relation Age of Onset  . Cervical cancer Mother        deceased  . Cirrhosis Father   . Colon cancer Neg Hx     Social History: Social History   Socioeconomic History  . Marital status: Married    Spouse name: Not on file  . Number of children: 2  . Years of education: Not on file  . Highest education level: Not on file  Occupational History  . Occupation: Retired  Scientific laboratory technician  . Financial resource strain: Not on file  . Food insecurity    Worry: Not on file    Inability: Not on file  . Transportation needs    Medical: Not  on file    Non-medical: Not on file  Tobacco Use  . Smoking status: Former Smoker    Packs/day: 0.50    Years: 20.00    Pack years: 10.00    Types: Cigarettes    Quit date: 05/07/1976    Years since quitting: 42.6  . Smokeless tobacco: Former Systems developer    Types: Chew    Quit date: 05/07/1998  . Tobacco comment: Scoal  Substance and Sexual Activity  . Alcohol use: Yes    Comment: 1 beer every 6 months  . Drug use: No  . Sexual activity: Not on file  Lifestyle  . Physical activity    Days per week: Not on file    Minutes per session: Not on file  . Stress: Not on file  Relationships  . Social Herbalist on phone: Not on file    Gets together: Not on file    Attends religious service: Not on file    Active member of club or organization: Not  on file    Attends meetings of clubs or organizations: Not on file    Relationship status: Not on file  Other Topics Concern  . Not on file  Social History Narrative   Married, 2 adult children ([place]).   Lives in [place].     Occ: Retired from [employer] ([place] at age 77.   Tob (smoke and chew) x 20 yrs, quit approx 1980s.   Alcohol: very rare.   One cup coffee each morning.   Exercise: walks 3 miles about 4 times per week.   Active lifestyle.    Allergies: Allergies  Allergen Reactions  . Ace Inhibitors Cough  . Imdur [Isosorbide Dinitrate] Other (See Comments)    Headache    Outpatient Meds: Current Outpatient Medications  Medication Sig Dispense Refill  . aspirin 81 MG tablet Take 81 mg every evening by mouth.     . celecoxib (CELEBREX) 200 MG capsule TAKE 1 CAPSULE (200 MG) BY MOUTH 2 TIMES A DAY AS NEEDED FOR PAIn 60 capsule 3  . Cholecalciferol (VITAMIN D3) 5000 units CAPS Take 5,000 Units every evening by mouth.    . Coenzyme Q10 (COQ10) 100 MG CAPS Take 100 mg every evening by mouth.    . nitroGLYCERIN (NITROSTAT) 0.4 MG SL tablet PLACE 1 TABLET UNDER THE TONGUE EVERY 5 MINUTES AS NEEDED FOR  CHEST PAIN. 25 tablet 2  . rosuvastatin (CRESTOR) 20 MG tablet Take 1 tablet (20 mg total) by mouth daily. MUST KEEP APPOINTMENT 01/23/19 WITH DR [facility] FOR FUTURE REFILLS 90 tablet 0  . losartan (COZAAR) 25 MG tablet TAKE 1 TABLET (25 MG TOTAL) BY MOUTH DAILY. 90 tablet 3   No current facility-administered medications for this visit.       ___________________________________________________________________ Objective   Exam:  BP 120/80 (BP Location: Left Arm, Patient Position: Sitting)   Pulse (!) 59   Temp 98.1 F (36.7 C) (Temporal)   Ht 5' 8"  (1.727 m)   Wt 169 lb (76.7 kg)   SpO2 98%   BMI 25.70 kg/m    General: Well-appearing, pleasant and conversational  Eyes: sclera anicteric, no redness  ENT: oral mucosa moist without lesions, no cervical or supraclavicular lymphadenopathy  CV: RRR without murmur, S1/S2, no JVD, no peripheral edema  Resp: clear to auscultation bilaterally, normal RR and effort noted  GI: soft, no tenderness, with active bowel sounds. No guarding or palpable organomegaly noted.  Midline scar with ventral hernia, prominent when he sits up, nontender  Skin; warm and dry, no rash or jaundice noted  Neuro: awake, alert and oriented x 3. Normal gross motor function and fluent speech Rectal: Redundant perianal skin folds, no hemorrhoids, no fissure or tenderness or palpable internal lesions.  Soft brown stool in the rectal vault. Labs:  CBC Latest Ref Rng & Units 05/08/2018 03/15/2017 09/23/2015  WBC 4.0 - 10.5 K/uL 7.5 4.8 6.6  Hemoglobin 13.0 - 17.0 g/dL 15.4 16.2 15.8  Hematocrit 39.0 - 52.0 % 45.7 46.9 47.2  Platelets 150 - 400 K/uL 164 217 199.0     Assessment: Encounter Diagnoses  Name Primary?  . Diverticulosis of colon without hemorrhage Yes  . Abdominal bloating   . Personal history of colonic polyps     He has side effects from probiotics, but feels well when he does not take them.  He does not have a clear indication for them, so I  do not feel he needs those anymore.  He has severe diverticulosis,  which sounds a good made for challenging scope passage on his last procedure.  That, combined with his age and medical issues and current guidelines indicate he does not need further colon cancer screening. I recommended once daily Citrucel fiber to help bulk the stool for more complete evacuation.  This might also help with the toilet hygiene issue.  Benign perianal findings.   Thank you for the courtesy of this consult.  Please call me with any questions or concerns.  Nelida Meuse III  CC: Referring provider noted above

## 2018-12-29 NOTE — Patient Instructions (Signed)
If you are age 77 or older, your body mass index should be between 23-30. Your Body mass index is 25.7 kg/m. If this is out of the aforementioned range listed, please consider follow up with your Primary Care Provider.  If you are age 56 or younger, your body mass index should be between 19-25. Your Body mass index is 25.7 kg/m. If this is out of the aformentioned range listed, please consider follow up with your Primary Care Provider.   It was a pleasure to see you today!  Dr. Loletha Carrow

## 2019-01-01 ENCOUNTER — Ambulatory Visit: Payer: 59

## 2019-01-06 ENCOUNTER — Encounter: Payer: Self-pay | Admitting: Family Medicine

## 2019-01-08 ENCOUNTER — Other Ambulatory Visit: Payer: Self-pay

## 2019-01-08 ENCOUNTER — Ambulatory Visit (INDEPENDENT_AMBULATORY_CARE_PROVIDER_SITE_OTHER): Payer: 59

## 2019-01-08 DIAGNOSIS — Z23 Encounter for immunization: Secondary | ICD-10-CM | POA: Diagnosis not present

## 2019-01-08 NOTE — Progress Notes (Signed)
5

## 2019-01-22 NOTE — Progress Notes (Signed)
HPI The patient returns for follow up of CAD.  At the last visit he had decreased exercise tolerance.  I sent him for cath and he was found to have 3 - 80% mid/distal circ stenosis and a small D1 80% stenosis and proximal stents in the prox and mid LAD.  He was managed medically.  Following this we try to treat him with Imdur and he developed severe headaches.  He had a cough on lisinopril.   Since I saw him he has done well.  He needs repair of an abdominal and inguinal hernia.  This is a preop clearance appointment as much as is follow-up.  The patient denies any new symptoms such as chest discomfort, neck or arm discomfort. There has been no new shortness of breath, PND or orthopnea. There have been no reported palpitations, presyncope or syncope.  He remains very active.  He is tearing up and remodeling vascular.  He traveled to [place] last year but had to cancel a trip to [place] and [place] this year.   Allergies  Allergen Reactions  . Ace Inhibitors Cough  . Imdur [Isosorbide Dinitrate] Other (See Comments)    Headache    Current Outpatient Medications  Medication Sig Dispense Refill  . aspirin 81 MG tablet Take 81 mg every evening by mouth.     . celecoxib (CELEBREX) 200 MG capsule TAKE 1 CAPSULE (200 MG) BY MOUTH 2 TIMES A DAY AS NEEDED FOR PAIn 60 capsule 3  . Cholecalciferol (VITAMIN D3) 5000 units CAPS Take 5,000 Units every evening by mouth.    . Coenzyme Q10 (COQ10) 100 MG CAPS Take 100 mg every evening by mouth.    . losartan (COZAAR) 25 MG tablet Take 1 tablet (25 mg total) by mouth daily. 90 tablet 3  . nitroGLYCERIN (NITROSTAT) 0.4 MG SL tablet PLACE 1 TABLET UNDER THE TONGUE EVERY 5 MINUTES AS NEEDED FOR CHEST PAIN. 25 tablet 2  . rosuvastatin (CRESTOR) 20 MG tablet Take 1 tablet (20 mg total) by mouth daily. MUST KEEP APPOINTMENT 01/23/19 WITH DR [facility] FOR FUTURE REFILLS 90 tablet 0   No current facility-administered medications for this visit.     Past  Medical History:  Diagnosis Date  . Arthritis   . 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 Bloating 2020   suspected to be from chronic probiotic use  . BPH (benign prostatic hyperplasia)   . Chronic renal insufficiency, stage 2 (mild)    borderlines II/III  . Colon polyps 07/2011   hyperplastic 07/25/2011. No further colon ca screening indicated as of 12/2018 GI eval (Dr. Loletha Carrow).  . Coronary artery disease 12/22/2007   Cath 03/2017-->  70 - 80% mid/distal circ stenosis and a small D1 80% stenosis and proximal stents in the prox and mid LAD.   Med mgmt rec'd-->nitrates caused HAs, ACE-I caused cough.  . DDD (degenerative disc disease)    s/p surgery; L/S spine MRI 05/2004 showed DDD/spondylosis with right L3 nerve root abutment, with L5/S1 surgical changes.  . Diverticulosis of sigmoid colon 07/25/11   Severe (endoscopy by Dr. Sharlett Iles)  . Erectile dysfunction   . Gallbladder polyp 2013   Noted 06/2011.  Stable on u/s f/u 10/2011.  . Gallstone 05/2018   Asymptomatic (picked up on CT abd/pelv).  . Hearing loss 2004   Dr. Tamala Julian, ENT  . History of prostate cancer 12/2014   Robot assisted radical prostatectomy 07/2015: lymph node involvement.  Plan is active surveillance as  per urology: As of  09/2018 PSA undetectable.  Marland Kitchen HTN (hypertension)    ACE-I cough.  Fine on ARB.  Marland Kitchen Hyperlipidemia   . NASH (nonalcoholic steatohepatitis) 06/2011   mild transaminasemia (Hep B and C testing neg)  . Pulmonary nodules    small, picked up on CT ab/pelv for abd pains/flank pains-->pt quit smoking 40 yrs ago.  Low risk for lung ca so no f/u CT needed.  . Tinnitus   . Ureterolithiasis    8 mm left distal ureteral stone 05/2018, got ureteroscopy with holmium laser treatment.    Past Surgical History:  Procedure Laterality Date  . APPENDECTOMY  1953  . BACK SURGERY     C-Spine and L-Spine  . CARDIAC CATHETERIZATION    . CARDIOVASCULAR STRESS TEST  2012   Normal  . COLONOSCOPY  02/26/02; 07/2011    Colonoscopy by Dr. Rowe Pavy 2003 was normal (+hx of polyps (adenomatous?) prior), 07/2011 showed one hyperplastic polyp. NO FURTHER SCREENING COLONOSCOPIES INDICTAED as of 12/2018 GI eval.  . CORONARY STENT PLACEMENT     x 2 (LAD)  . CYSTOSCOPY WITH RETROGRADE PYELOGRAM, URETEROSCOPY AND STENT PLACEMENT Left 05/10/2018   Procedure: CYSTOSCOPY WITH LEFT RETROGRADE PYELOGRAM, LEFT URETEROSCOPY AND LEFT URETERAL STENT PLACEMENT;  Surgeon: Alexis Frock, MD;  Location: [facility];  Service: Urology;  Laterality: Left;  . HOLMIUM LASER APPLICATION Left 07/08/74   Procedure: HOLMIUM LASER APPLICATION;  Surgeon: Alexis Frock, MD;  Location: [facility];  Service: Urology;  Laterality: Left;  . LEFT HEART CATH AND CORONARY ANGIOGRAPHY N/A 03/21/2017   Stents patent; mod circ dz, nothing for intervention--imdur added.  Procedure: LEFT HEART CATH AND CORONARY ANGIOGRAPHY;  Surgeon: Nelva Bush, MD;  Location: [facility];  Service: Cardiovascular;  Laterality: N/A;  . LOBECTOMY     right upper lobectomy for blastomycosis  . LYMPHADENECTOMY Bilateral 07/14/2015   Procedure: LYMPHADENECTOMY;  Surgeon: Raynelle Bring, MD;  Location: [facility];  Service: Urology;  Laterality: Bilateral;  . PROSTATE BIOPSY  04/27/15   4 of 12 bx core's + prostate adenocarcinoma (tentative treatment plan is prostatectomy ? + other therapies (as of 05/18/15)  . ROBOT ASSISTED LAPAROSCOPIC RADICAL PROSTATECTOMY N/A 07/14/2015   One positive pelvic LN.  Procedure: XI ROBOTIC ASSISTED LAPAROSCOPIC RADICAL PROSTATECTOMY LEVEL 2;  Surgeon: Raynelle Bring, MD;  Location: [facility];  Service: Urology;  Laterality: N/A;  . UMBILICAL HERNIA REPAIR  07/14/2015   Procedure: HERNIA REPAIR UMBILICAL ADULT;  Surgeon: Raynelle Bring, MD;  Location: [facility];  Service: Urology;;    ROS:   As stated in the HPI and negative for all other systems.   PHYSICAL EXAM BP 130/66   Pulse (!) 53   Temp (!) 96.8 F (36 C) (Temporal)   Ht 5' 8"  (1.727 m)   Wt 169  lb (76.7 kg)   BMI 25.70 kg/m   GENERAL:  Well appearing NECK:  No jugular venous distention, waveform within normal limits, carotid upstroke brisk and symmetric, no bruits, no thyromegaly LUNGS:  Clear to auscultation bilaterally CHEST:  Unremarkable HEART:  PMI not displaced or sustained,S1 and S2 within normal limits, no S3, no S4, no clicks, no rubs, no murmurs ABD:  Flat, positive bowel sounds normal in frequency in pitch, no bruits, no rebound, no guarding, no midline pulsatile mass, no hepatomegaly, no splenomegaly EXT:  2 plus pulses throughout, no edema, no cyanosis no clubbing  EKG: Sinus rhythm, rate 53, axis within normal limits, intervals within normal limits, no acute ST-T  wave changes.  01/23/2019    Lab Results  Component Value Date   CHOL 112 11/28/2018   TRIG 138.0 11/28/2018   HDL 39.80 11/28/2018   LDLCALC 45 11/28/2018   LDLDIRECT 55.0 05/30/2018    ASSESSMEN the  AND PLAN  CAD - The patient has new symptoms.  He is very active.  No change in therapy.   DYSLIPIDEMIA -  His LDL is 45.  No change in therapy.   HTN (hypertension) -  The blood pressure is well controlled and at target.  We will continue the meds as listed.   PREOP - The patient has a very high functional level.  He has no symptoms.  Is not going for high risk procedure.  Therefore, based on ACC/AHA guidelines the patient is at acceptable risk for the planned surgery without further testing or change in therapy.

## 2019-01-23 ENCOUNTER — Other Ambulatory Visit: Payer: Self-pay

## 2019-01-23 ENCOUNTER — Encounter: Payer: Self-pay | Admitting: Cardiology

## 2019-01-23 ENCOUNTER — Ambulatory Visit (INDEPENDENT_AMBULATORY_CARE_PROVIDER_SITE_OTHER): Payer: 59 | Admitting: Cardiology

## 2019-01-23 VITALS — BP 130/66 | HR 53 | Temp 96.8°F | Ht 68.0 in | Wt 169.0 lb

## 2019-01-23 DIAGNOSIS — I10 Essential (primary) hypertension: Secondary | ICD-10-CM

## 2019-01-23 DIAGNOSIS — E785 Hyperlipidemia, unspecified: Secondary | ICD-10-CM

## 2019-01-23 DIAGNOSIS — I2510 Atherosclerotic heart disease of native coronary artery without angina pectoris: Secondary | ICD-10-CM | POA: Diagnosis not present

## 2019-01-23 MED ORDER — LOSARTAN POTASSIUM 25 MG PO TABS: 25.0000 mg | ORAL_TABLET | Freq: Every day | ORAL | 3 refills | Status: DC

## 2019-01-23 MED FILL — LOSARTAN POTASSIUM 25 MG TA: 25 | 90 days supply | Qty: 90 | Fill #0

## 2019-01-23 NOTE — Patient Instructions (Signed)
Medication Instructions:  Your physician recommends that you continue on your current medications as directed. Please refer to the Current Medication list given to you today.  If you need a refill on your cardiac medications before your next appointment, please call your pharmacy.   Lab work: NONE  Testing/Procedures: NONE  Follow-Up: At [facility], you and your health needs are our priority.  As part of our continuing mission to provide you with exceptional heart care, we have created designated Provider Care Teams.  These Care Teams include your primary Cardiologist (physician) and Advanced Practice Providers (APPs -  Physician Assistants and Nurse Practitioners) who all work together to provide you with the care you need, when you need it. You will need a follow up appointment in 12 months.  Please call our office 2 months in advance to schedule this appointment.  You may see Gaither Hochrein, MD or one of the following Advanced Practice Providers on your designated Care Team:   Rhonda Barrett, PA-C Kathryn Lawrence, DNP, ANP       

## 2019-02-01 ENCOUNTER — Ambulatory Visit: Payer: Self-pay | Admitting: Surgery

## 2019-02-01 NOTE — H&P (Signed)
William Montgomery  DOB: June 04, 1940 Married / Language: Cleophus Molt / Race: White Male   Patient Care Team: Tammi Sou, MD as PCP - General Minus Breeding, MD as PCP - Cardiology (Cardiology) Raynelle Bring, MD as Consulting Physician (Urology) Minus Breeding, MD as Consulting Physician (Cardiology) End, Harrell Gave, MD as Consulting Physician (Cardiology) Haverstock, Jennefer Bravo, MD as Referring Physician (Dermatology) Alexis Frock, MD as Consulting Physician (Urology) Michael Boston, MD as Consulting Physician (General Surgery) Danis, Kirke Corin, MD as Consulting Physician (Gastroenterology)  ` ` Patient sent for surgical consultation at the request of Dr Anitra Lauth  Chief Complaint: Incisional hernia right lower quadrant port site. ` ` The patient is a pleasant active male with stable coronary disease. He required a robotic prostatectomy by Dr. Raynelle Bring 2017. He had a periumbilical hernia which is primarily repaired at the time. Patient noticed bulging in his right lower abdomen and one of the robotic port sites. It is gradually gotten larger. Occasionally gets some burning and discomfort with it. 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 Becoming increasingly bothersome. He mentioned it to his primary care physician on his annual visit. Incisional hernia suspected. Surgical consultation offered.  Patient comes in today by himself. He moves his bowels once a day. Went up 25 flights of stairs yesterday without difficulty. Can walk at least a half hour without difficulty. He takes a baby aspirin but no other anticoagulants. He has some 80% stenosis of a few his coronary vessels by catheterization but sees Dr. Percival Spanish with cardiology. No major issues. Moves his bowels every day. Had an underwhelming colonoscopy 7 years ago. He had an appendectomy at age 30. No other abdominal surgeries. He is not a diabetic. He  does not smoke. He is not on any suppressive medications. No issues of urinary incontinence or difficulty. No urinary tract infections.  (Review of systems as stated in this history (HPI) or in the review of systems. Otherwise all other 12 point ROS are negative) ` ` `   Allergies Emeline Gins, CMA; 07/09/2018 8:40 AM) No Known Drug Allergies [07/09/2018]: Allergies Reconciled   Medication History Emeline Gins, CMA; 07/09/2018 8:41 AM) Rosuvastatin Calcium (20MG Tablet, Oral) Active. Losartan Potassium (25MG Tablet, Oral) Active. Aspirin (81MG Tablet, Oral) Active. Coenzyme Q10 (100MG Capsule, Oral) Active. Celecoxib (200MG Capsule, Oral) Active. Amitriptyline HCl (25MG Tablet, Oral) Active. Medications Reconciled  Vitals Emeline Gins CMA; 07/09/2018 8:40 AM) 07/09/2018 8:39 AM Weight: 180.2 lb Height: 68in Body Surface Area: 1.96 m Body Mass Index: 27.4 kg/m  Temp.: 41F  Pulse: 68 (Regular)  BP: 142/78 (Sitting, Left Arm, Standard)       Physical Exam Adin Hector MD; 07/09/2018 9:07 AM) General Mental Status-Alert. General Appearance-Not in acute distress, Not Sickly. Orientation-Oriented X3. Hydration-Well hydrated. Voice-Normal.  Integumentary Global Assessment Upon inspection and palpation of skin surfaces of the - Axillae: non-tender, no inflammation or ulceration, no drainage. and Distribution of scalp and body hair is normal. General Characteristics Temperature - normal warmth is noted.  Head and Neck Head-normocephalic, atraumatic with no lesions or palpable masses. Face Global Assessment - atraumatic, no absence of expression. Neck Global Assessment - no abnormal movements, no bruit auscultated on the right, no bruit auscultated on the left, no decreased range of motion, non-tender. Trachea-midline. Thyroid Gland Characteristics - non-tender.  Eye Eyeball - Left-Extraocular movements  intact, No Nystagmus. Eyeball - Right-Extraocular movements intact, No Nystagmus. Cornea - Left-No Hazy. Cornea -  Right-No Hazy. Sclera/Conjunctiva - Left-No scleral icterus, No Discharge. Sclera/Conjunctiva - Right-No scleral icterus, No Discharge. Pupil - Left-Direct reaction to light normal. Pupil - Right-Direct reaction to light normal.  ENMT Ears Pinna - Left - no drainage observed, no generalized tenderness observed. Right - no drainage observed, no generalized tenderness observed. Nose and Sinuses External Inspection of the Nose - no destructive lesion observed. Inspection of the nares - Left - quiet respiration. Right - quiet respiration. Mouth and Throat Lips - Upper Lip - no fissures observed, no pallor noted. Lower Lip - no fissures observed, no pallor noted. Nasopharynx - no discharge present. Oral Cavity/Oropharynx - Tongue - no dryness observed. Oral Mucosa - no cyanosis observed. Hypopharynx - no evidence of airway distress observed.  Chest and Lung Exam Inspection Movements - Normal and Symmetrical. Accessory muscles - No use of accessory muscles in breathing. Palpation Palpation of the chest reveals - Non-tender. Auscultation Breath sounds - Normal and Clear.  Cardiovascular Auscultation Rhythm - Regular. Murmurs & Other Heart Sounds - Auscultation of the heart reveals - No Murmurs and No Systolic Clicks.  Abdomen Inspection Inspection of the abdomen reveals - No Visible peristalsis and No Abnormal pulsations. Umbilicus - No Bleeding, No Urine drainage. Palpation/Percussion Palpation and Percussion of the abdomen reveal - Soft, Non Tender, No Rebound tenderness, No Rigidity (guarding) and No Cutaneous hyperesthesia. Note: Right lower quadrant 6 x 5 cm bulging reducing down to a smaller defect at his prior robotic port site.  Umbilical vertical incision. I can feel stitches but no obvious hernia there. Moderate diastases recti. No left-sided  ports. Nontender.   Male Genitourinary Sexual Maturity Tanner 5 - Adult hair pattern and Adult penile size and shape. Note: Obvious right inguinal hernia. Possible impulse on left groin is well suspicious for small left inguinal hernia. Otherwise normal external male genitalia. No testicular masses.   Peripheral Vascular Upper Extremity Inspection - Left - No Cyanotic nailbeds, Not Ischemic. Right - No Cyanotic nailbeds, Not Ischemic.  Neurologic Neurologic evaluation reveals -normal attention span and ability to concentrate, able to name objects and repeat phrases. Appropriate fund of knowledge , normal sensation and normal coordination. Mental Status Affect - not angry, not paranoid. Cranial Nerves-Normal Bilaterally. Gait-Normal.  Neuropsychiatric Mental status exam performed with findings of-able to articulate well with normal speech/language, rate, volume and coherence, thought content normal with ability to perform basic computations and apply abstract reasoning and no evidence of hallucinations, delusions, obsessions or homicidal/suicidal ideation.  Musculoskeletal Global Assessment Spine, Ribs and Pelvis - no instability, subluxation or laxity. Right Upper Extremity - no instability, subluxation or laxity.  Lymphatic Head & Neck  General Head & Neck Lymphatics: Bilateral - Description - No Localized lymphadenopathy. Axillary  General Axillary Region: Bilateral - Description - No Localized lymphadenopathy. Femoral & Inguinal  Generalized Femoral & Inguinal Lymphatics: Left - Description - No Localized lymphadenopathy. Right - Description - No Localized lymphadenopathy.    Assessment & Plan INCISIONAL HERNIA, WITHOUT OBSTRUCTION OR GANGRENE (K43.2) Impression: Right lower quadrant incisional hernia at port site from robotic prostatectomy. No evidence of any recurrent periumbilical hernia after a primary repair at the same time. Obvious right  inguinal hernia and possible left inguinal hernia.  I think he would benefit from surgical repair.  Pt ready to consider surgery  He has good exercise tolerance but does have a history coronary disease. Clearance from his cardiologist, Dr. Percival Spanish. Patient sees him rather regularly, so hopefully just a letter. Catheterization showed stable disease.  Overnight stay given the multiple hernia locations even though not particularly large. We will see.  He's hoping to get back to work within 2 weeks. I cautioned that would be somewhat ambitious for at least light/moderate duty. At least 6 weeks before complete unrestricted activity. He understands. Current Plans The anatomy & physiology of the abdominal wall was discussed. The pathophysiology of hernias was discussed. Natural history risks without surgery including progeressive enlargement, pain, incarceration, & strangulation was discussed. Contributors to complications such as smoking, obesity, diabetes, prior surgery, etc were discussed.  I feel the risks of no intervention will lead to serious problems that outweigh the operative risks; therefore, I recommended surgery to reduce and repair the hernia. I explained laparoscopic techniques with possible need for an open approach. I noted the probable use of mesh to patch and/or buttress the hernia repair  Risks such as bleeding, infection, abscess, need for further treatment, heart attack, death, and other risks were discussed. I noted a good likelihood this will help address the problem. Goals of post-operative recovery were discussed as well. Possibility that this will not correct all symptoms was explained. I stressed the importance of low-impact activity, aggressive pain control, avoiding constipation, & not pushing through pain to minimize risk of post-operative chronic pain or injury. Possibility of reherniation especially with smoking, obesity, diabetes, immunosuppression, and other  health conditions was discussed. We will work to minimize complications.  An educational handout further explaining the pathology & treatment options was given as well. Questions were answered. The patient expresses understanding & wishes to proceed with surgery.  I recommended obtaining preoperative cardiac clearance. I am concerned about the health of the patient and the ability to tolerate the operation. Therefore, we will request clearance by cardiology to better assess operative risk & see if a reevaluation, further workup, etc is needed. Also recommendations on how medications such as for anticoagulation and blood pressure should be managed/held/restarted after surgery.  RIGHT INGUINAL HERNIA (K40.90) Impression: Obvious right inguinal hernia. Possible contralateral left inguinal hernia as well. Recommended laparoscopic exploration repair of hernias found. Because he has incisional hernia in the right lower quadrant, most likely this will be a TAPP like approach.  Current Plans The anatomy & physiology of the abdominal wall and pelvic floor was discussed. The pathophysiology of hernias in the inguinal and pelvic region was discussed. Natural history risks such as progressive enlargement, pain, incarceration, and strangulation was discussed. Contributors to complications such as smoking, obesity, diabetes, prior surgery, etc were discussed.  I feel the risks of no intervention will lead to serious problems that outweigh the operative risks; therefore, I recommended surgery to reduce and repair the hernia. I explained laparoscopic techniques with possible need for an open approach. I noted usual use of mesh to patch and/or buttress hernia repair  Risks such as bleeding, infection, abscess, need for further treatment, heart attack, death, and other risks were discussed. I noted a good likelihood this will help address the problem. Goals of post-operative recovery were discussed  as well. Possibility that this will not correct all symptoms was explained. I stressed the importance of low-impact activity, aggressive pain control, avoiding constipation, & not pushing through pain to minimize risk of post-operative chronic pain or injury. Possibility of reherniation was discussed. We will work to minimize complications.  An educational handout further explaining the pathology & treatment options was given as well. Questions were answered. The patient expresses understanding & wishes to proceed with surgery.    Adin Hector,  MD, FACS, MASCRS Gastrointestinal and Minimally Invasive Surgery    [address] [place], [place] [address] [phone] Main / Paging [phone] Fax

## 2019-02-09 ENCOUNTER — Other Ambulatory Visit: Payer: Self-pay | Admitting: Cardiology

## 2019-02-09 MED FILL — ROSUVASTATIN CALCIUM 20 MG: 20 | 90 days supply | Qty: 90 | Fill #0

## 2019-03-12 NOTE — Patient Instructions (Addendum)
DUE TO COVID-19 ONLY ONE VISITOR IS ALLOWED TO COME WITH YOU AND STAY IN THE WAITING ROOM ONLY DURING PRE OP AND PROCEDURE DAY OF SURGERY. THE 1 VISITOR MAY VISIT WITH YOU AFTER SURGERY IN YOUR PRIVATE ROOM DURING VISITING HOURS ONLY!  YOU HAVE HAD A COVID 19 TEST ON MONDAY 03/16/2019 , THIS TEST MUST BE DONE BEFORE SURGERY, COME  [facility], [name] , [address].  ([place]) ONCE YOUR COVID TEST IS COMPLETED, PLEASE BEGIN THE QUARANTINE INSTRUCTIONS AS OUTLINED IN YOUR HANDOUT.                William Montgomery      Your procedure is scheduled on: Thursday 03/19/2019   Report to [facility] Main  Entrance    Report to admitting at 7:00 AM     Call this number if you have problems the morning of surgery [phone]    Remember: Do not eat food or drink liquids after Midnight.      Take these medicines the morning of surgery with A SIP OF WATER: Rosuvastatin (Crestor)  BRUSH YOUR TEETH MORNING OF SURGERY AND RINSE YOUR MOUTH OUT, NO CHEWING GUM CANDY OR MINTS.                                You may not have any metal on your body including hair pins and              piercings     Do not wear jewelry, lotions, powders or cologne, deodorant              Men may shave face and neck.   Do not bring valuables to the hospital. [facility].  Contacts, dentures or bridgework may not be worn into surgery.  Leave suitcase in the car. After surgery it may be brought to your room.     Please read over the following fact sheets you were given: _____________________________________________________________________             [facility] - Preparing for Surgery Before surgery, you can play an important role.  Because skin is not sterile, your skin needs to be as free of germs as possible.  You can reduce the number of germs on your skin by washing with CHG (chlorahexidine gluconate) soap before surgery.  CHG  is an antiseptic cleaner which kills germs and bonds with the skin to continue killing germs even after washing. Please DO NOT use if you have an allergy to CHG or antibacterial soaps.  If your skin becomes reddened/irritated stop using the CHG and inform your nurse when you arrive at Short Stay. Do not shave (including legs and underarms) for at least 48 hours prior to the first CHG shower.  You may shave your face/neck. Please follow these instructions carefully:  1.  Shower with CHG Soap the night before surgery and the  morning of Surgery.  2.  If you choose to wash your hair, wash your hair first as usual with your  normal  shampoo.  3.  After you shampoo, rinse your hair and body thoroughly to remove the  shampoo.  4.  Use CHG as you would any other liquid soap.  You can apply chg directly  to the skin and wash                       Gently with a scrungie or clean washcloth.  5.  Apply the CHG Soap to your body ONLY FROM THE NECK DOWN.   Do not use on face/ open                           Wound or open sores. Avoid contact with eyes, ears mouth and genitals (private parts).                       Wash face,  Genitals (private parts) with your normal soap.             6.  Wash thoroughly, paying special attention to the area where your surgery  will be performed.  7.  Thoroughly rinse your body with warm water from the neck down.  8.  DO NOT shower/wash with your normal soap after using and rinsing off  the CHG Soap.                9.  Pat yourself dry with a clean towel.            10.  Wear clean pajamas.            11.  Place clean sheets on your bed the night of your first shower and do not  sleep with pets. Day of Surgery : Do not apply any lotions/deodorants the morning of surgery.  Please wear clean clothes to the hospital/surgery center.  FAILURE TO FOLLOW THESE INSTRUCTIONS MAY RESULT IN THE CANCELLATION OF YOUR SURGERY PATIENT  SIGNATURE_________________________________  NURSE SIGNATURE__________________________________  ________________________________________________________________________

## 2019-03-13 NOTE — Progress Notes (Signed)
PCP - Shawnie Dapper  Cardiologist - Minus Breeding w/cardiac clearance in Epic dated 01-23-19   Chest x-ray -   EKG - 01-23-19   Stress Test -  ECHO -  Cardiac Cath -   Sleep Study -  CPAP -   Fasting Blood Sugar -  Checks Blood Sugar _____ times a day  Blood Thinner Instructions: Aspirin Instructions: Last Dose:  Anesthesia review:   Patient denies shortness of breath, fever, cough and chest pain at PAT appointment   Patient verbalized understanding of instructions that were given to them at the PAT appointment. Patient was also instructed that they will need to review over the PAT instructions again at home before surgery.

## 2019-03-16 ENCOUNTER — Encounter (HOSPITAL_COMMUNITY): Payer: Self-pay

## 2019-03-16 ENCOUNTER — Other Ambulatory Visit (HOSPITAL_COMMUNITY)
Admission: RE | Admit: 2019-03-16 | Discharge: 2019-03-16 | Disposition: A | Discharge status: Home / Self Care | Payer: 59 | Source: Ambulatory Visit | Attending: Surgery | Admitting: Surgery

## 2019-03-16 ENCOUNTER — Encounter (HOSPITAL_COMMUNITY)
Admission: RE | Admit: 2019-03-16 | Discharge: 2019-03-16 | Disposition: A | Discharge status: Home / Self Care | Payer: 59 | Source: Ambulatory Visit | Attending: Surgery | Admitting: Surgery

## 2019-03-16 ENCOUNTER — Other Ambulatory Visit: Payer: Self-pay

## 2019-03-16 DIAGNOSIS — Z01812 Encounter for preprocedural laboratory examination: Secondary | ICD-10-CM | POA: Insufficient documentation

## 2019-03-16 DIAGNOSIS — Z20828 Contact with and (suspected) exposure to other viral communicable diseases: Secondary | ICD-10-CM | POA: Diagnosis not present

## 2019-03-16 LAB — CBC
HCT: 44.6 % (ref 39.0–52.0)
Hemoglobin: 14.6 g/dL (ref 13.0–17.0)
MCH: 33.7 pg (ref 26.0–34.0)
MCHC: 32.7 g/dL (ref 30.0–36.0)
MCV: 103 fL — ABNORMAL HIGH (ref 80.0–100.0)
Platelets: 144 10*3/uL — ABNORMAL LOW (ref 150–400)
RBC: 4.33 MIL/uL (ref 4.22–5.81)
RDW: 12.8 % (ref 11.5–15.5)
WBC: 4.2 10*3/uL (ref 4.0–10.5)
nRBC: 0 % (ref 0.0–0.2)

## 2019-03-16 LAB — BASIC METABOLIC PANEL
Anion gap: 7 (ref 5–15)
BUN: 13 mg/dL (ref 8–23)
CO2: 26 mmol/L (ref 22–32)
Calcium: 9.4 mg/dL (ref 8.9–10.3)
Chloride: 106 mmol/L (ref 98–111)
Creatinine, Ser: 0.91 mg/dL (ref 0.61–1.24)
GFR calc Af Amer: >60 mL/min (ref >60)
GFR calc non Af Amer: >60 mL/min (ref >60)
Glucose, Bld: 105 mg/dL — ABNORMAL HIGH (ref 70–99)
Potassium: 5.1 mmol/L (ref 3.5–5.1)
Sodium: 139 mmol/L (ref 135–145)

## 2019-03-16 NOTE — Progress Notes (Signed)
Anesthesia Chart Review   Case: [id number] Date/Time: 03/19/19 0845   Procedures:      LAPAROSCOPIC INCISIONAL HERNIA (N/A )     LAPAROSCOPIC RIGHT INGUINAL HERNIA REPAIR (Right )   Anesthesia type: General   Pre-op diagnosis: INCISIONAL HERNIA, RIGHT INGUINAL HERNIA   Location[facility] ROOM 01 / [facility]   Surgeon: Michael Boston, MD      DISCUSSION:78 y.o. former smoker (10 pack years, quit 05/07/76) with h/o HLD< BPH, HTN, prostate cancer, NASH, CKD Stage II, CAD (+stents), incisional hernia, right inguinal hernia scheduled for above procedure 03/19/2019 with Dr. Michael Boston.   Pt last seen by cardiologist, Dr. Minus Breeding, 01/23/2019.  Per OV note, "The patient has a very high functional level.  He has no symptoms.  Is not going for high risk procedure.  Therefore, based on ACC/AHA guidelines the patient is at acceptable risk for the planned surgery without further testing or change in therapy."  Anticipate pt can proceed with planned procedure barring acute status change.   VS: BP 127/72 (BP Location: Left Arm)   Pulse (!) 46   Temp 36.6 C   Resp 18   Ht 5' 8"  (1.727 m)   Wt 75.9 kg   SpO2 99%   BMI 25.45 kg/m   PROVIDERS: McGowen, Adrian Blackwater, MD is PCP   Minus Breeding, MD is Cardiologist  LABS: Labs reviewed: Acceptable for surgery. (all labs ordered are listed, but only abnormal results are displayed)  Labs Reviewed  BASIC METABOLIC PANEL - Abnormal; Notable for the following components:      Result Value   Glucose, Bld 105 (*)    All other components within normal limits  CBC - Abnormal; Notable for the following components:   MCV 103.0 (*)    Platelets 144 (*)    All other components within normal limits     IMAGES:   EKG: 01/23/2019 Rate 53 bpm Sinus bradycardia  CV: Cardiac Cath 03/21/2017 Conclusions: 1. Significant but noncritical single-vessel coronary artery disease with sequential 70% and 80% mid/distal LCx stenoses. 2. Stable 80% stenosis involving  small diagonal branch, unchanged since 2009. 3. Patent stents in the proximal and mid LAD. 4. Normal left ventricular ejection fraction and filling pressure.  Recommendations: 1. Images reviewed with Dr. Percival Spanish.  Given the relatively small size of mid/distal LCx, we both favor medical therapy over PCI.  If he has refractory angina, PCI could be considered.  We will start isosorbide mononitrate 30 mg daily. 2. Aggressive secondary prevention. 3. Outpatient follow-up with Dr. Percival Spanish. Past Medical History:  Diagnosis Date  . Arthritis   . 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 Bloating 2020   suspected to be from chronic probiotic use  . BPH (benign prostatic hyperplasia)   . Chronic renal insufficiency, stage 2 (mild)    borderlines II/III  . Colon polyps 07/2011   hyperplastic 07/25/2011. No further colon ca screening indicated as of 12/2018 GI eval (Dr. Loletha Carrow).  . Coronary artery disease 12/22/2007   Cath 03/2017-->  70 - 80% mid/distal circ stenosis and a small D1 80% stenosis and proximal stents in the prox and mid LAD.   Med mgmt rec'd-->nitrates caused HAs, ACE-I caused cough.  . DDD (degenerative disc disease)    s/p surgery; L/S spine MRI 05/2004 showed DDD/spondylosis with right L3 nerve root abutment, with L5/S1 surgical changes.  . Diverticulosis of sigmoid colon 07/25/11   Severe (endoscopy by Dr. Sharlett Iles)  . Erectile dysfunction   .  Gallbladder polyp 2013   Noted 06/2011.  Stable on u/s f/u 10/2011.  . Gallstone 05/2018   Asymptomatic (picked up on CT abd/pelv).  . Hearing loss 2004   Dr. Tamala Julian, ENT  . History of prostate cancer 12/2014   Robot assisted radical prostatectomy 07/2015: lymph node involvement.  Plan is active surveillance as per urology: As of  09/2018 PSA undetectable.  Marland Kitchen HTN (hypertension)    ACE-I cough.  Fine on ARB.  Marland Kitchen Hyperlipidemia   . NASH (nonalcoholic steatohepatitis) 06/2011   mild transaminasemia (Hep B and C testing neg)  . Pulmonary  nodules    small, picked up on CT ab/pelv for abd pains/flank pains-->pt quit smoking 40 yrs ago.  Low risk for lung ca so no f/u CT needed.  . Tinnitus   . Ureterolithiasis    8 mm left distal ureteral stone 05/2018, got ureteroscopy with holmium laser treatment.    Past Surgical History:  Procedure Laterality Date  . APPENDECTOMY  1953  . BACK SURGERY     C-Spine and L-Spine  . CARDIAC CATHETERIZATION    . CARDIOVASCULAR STRESS TEST  2012   Normal  . COLONOSCOPY  02/26/02; 07/2011   Colonoscopy by Dr. Rowe Pavy 2003 was normal (+hx of polyps (adenomatous?) prior), 07/2011 showed one hyperplastic polyp. NO FURTHER SCREENING COLONOSCOPIES INDICTAED as of 12/2018 GI eval.  . CORONARY STENT PLACEMENT     x 2 (LAD)  . CYSTOSCOPY WITH RETROGRADE PYELOGRAM, URETEROSCOPY AND STENT PLACEMENT Left 05/10/2018   Procedure: CYSTOSCOPY WITH LEFT RETROGRADE PYELOGRAM, LEFT URETEROSCOPY AND LEFT URETERAL STENT PLACEMENT;  Surgeon: Alexis Frock, MD;  Location: [facility];  Service: Urology;  Laterality: Left;  . HOLMIUM LASER APPLICATION Left 09/08/6566   Procedure: HOLMIUM LASER APPLICATION;  Surgeon: Alexis Frock, MD;  Location[facility];  Service: Urology;  Laterality: Left;  . LEFT HEART CATH AND CORONARY ANGIOGRAPHY N/A 03/21/2017   Stents patent; mod circ dz, nothing for intervention--imdur added.  Procedure: LEFT HEART CATH AND CORONARY ANGIOGRAPHY;  Surgeon: Nelva Bush, MD;  Location: [facility];  Service: Cardiovascular;  Laterality: N/A;  . LOBECTOMY     right upper lobectomy for blastomycosis  . LYMPHADENECTOMY Bilateral 07/14/2015   Procedure: LYMPHADENECTOMY;  Surgeon: Raynelle Bring, MD;  Location: [facility];  Service: Urology;  Laterality: Bilateral;  . PROSTATE BIOPSY  04/27/15   4 of 12 bx core's + prostate adenocarcinoma (tentative treatment plan is prostatectomy ? + other therapies (as of 05/18/15)  . ROBOT ASSISTED LAPAROSCOPIC RADICAL PROSTATECTOMY N/A 07/14/2015   One positive pelvic  LN.  Procedure: XI ROBOTIC ASSISTED LAPAROSCOPIC RADICAL PROSTATECTOMY LEVEL 2;  Surgeon: Raynelle Bring, MD;  [facility];  Service: Urology;  Laterality: N/A;  . UMBILICAL HERNIA REPAIR  07/14/2015   Procedure: HERNIA REPAIR UMBILICAL ADULT;  Surgeon: Raynelle Bring, MD;  Location[facility];  Service: Urology;;    MEDICATIONS: . aspirin 81 MG tablet  . celecoxib (CELEBREX) 200 MG capsule  . Cholecalciferol (VITAMIN D3) 5000 units CAPS  . Coenzyme Q10 (COQ10) 100 MG CAPS  . losartan (COZAAR) 25 MG tablet  . nitroGLYCERIN (NITROSTAT) 0.4 MG SL tablet  . rosuvastatin (CRESTOR) 20 MG tablet   No current facility-administered medications for this encounter.      Maia Plan [facility] Pre-Surgical Testing [phone] 03/16/19  1:45 PM

## 2019-03-16 NOTE — Anesthesia Preprocedure Evaluation (Addendum)
Anesthesia Evaluation  Patient identified by MRN, date of birth, ID band Patient awake    Reviewed: Allergy & Precautions, NPO status , Patient's Chart, lab work & pertinent test results  Airway Mallampati: I       Dental  (+) Upper Dentures, Lower Dentures   Pulmonary former smoker,    Pulmonary exam normal breath sounds clear to auscultation       Cardiovascular hypertension, Pt. on medications + CAD and + Cardiac Stents  Normal cardiovascular exam Rhythm:Regular Rate:Normal     Neuro/Psych negative neurological ROS  negative psych ROS   GI/Hepatic negative GI ROS,   Endo/Other    Renal/GU      Musculoskeletal   Abdominal Normal abdominal exam  (+)   Peds  Hematology negative hematology ROS (+)   Anesthesia Other Findings HEART CATH AND CORONARY ANGIOGRAPHY Conclusion  Conclusions: 1. Significant but noncritical single-vessel coronary artery disease with sequential 70% and 80% mid/distal LCx stenoses. 2. Stable 80% stenosis involving small diagonal branch, unchanged since 2009. 3. Patent stents in the proximal and mid LAD. 4. Normal left ventricular ejection fraction and filling pressure.  Recommendations: 1. Images reviewed with Dr. Percival Spanish.  Given the relatively small size of mid/distal LCx, we both favor medical therapy over PCI.  If he has refractory angina, PCI could be considered.  We will start isosorbide mononitrate 30 mg daily. 2. Aggressive secondary prevention. 3. Outpatient follow-up with Dr. Percival Spanish.  Nelva Bush, MD [facility]     Reproductive/Obstetrics                            Anesthesia Physical Anesthesia Plan  ASA: II  Anesthesia Plan: General   Post-op Pain Management:    Induction: Intravenous  PONV Risk Score and Plan: 4 or greater and Ondansetron, Dexamethasone and Treatment may vary due to age or medical condition  Airway Management Planned:  Oral ETT  Additional Equipment: None  Intra-op Plan:   Post-operative Plan:   Informed Consent: I have reviewed the patients History and Physical, chart, labs and discussed the procedure including the risks, benefits and alternatives for the proposed anesthesia with the patient or authorized representative who has indicated his/her understanding and acceptance.     Dental advisory given  Plan Discussed with: CRNA  Anesthesia Plan Comments: (See PAT note 03/16/2019, Konrad Felix, PA-C )       Anesthesia Quick Evaluation                                  Anesthesia Evaluation  Patient identified by MRN, date of birth, ID band Patient awake    Reviewed: Allergy & Precautions, H&P , NPO status , Patient's Chart, lab work & pertinent test results  History of Anesthesia Complications Negative for: history of anesthetic complications  Airway Mallampati: I       Dental  (+) Upper Dentures, Lower Dentures   Pulmonary former smoker,    Pulmonary exam normal breath sounds clear to auscultation       Cardiovascular hypertension, Pt. on medications + CAD  Normal cardiovascular exam Rhythm:Regular Rate:Normal  Cardiac stent x2 to LAD, normal Echo in 2012 with normal stress  Walks 9 miles a day and continues to work   Neuro/Psych negative neurological ROS  negative psych ROS   GI/Hepatic negative GI ROS, Neg liver ROS,   Endo/Other  negative endocrine ROS  Renal/GU  negative Renal ROS     Musculoskeletal  (+) Arthritis ,   Abdominal (+) + obese,   Peds  Hematology negative hematology ROS (+)   Anesthesia Other Findings   Reproductive/Obstetrics negative OB ROS                           Anesthesia Physical  Anesthesia Plan  ASA: II  Anesthesia Plan: General   Post-op Pain Management:    Induction: Intravenous  PONV Risk Score and Plan: 2 and Ondansetron and Dexamethasone  Airway Management Planned:  LMA  Additional Equipment:   Intra-op Plan:   Post-operative Plan: Extubation in OR  Informed Consent: I have reviewed the patients History and Physical, chart, labs and discussed the procedure including the risks, benefits and alternatives for the proposed anesthesia with the patient or authorized representative who has indicated his/her understanding and acceptance.     Plan Discussed with: CRNA  Anesthesia Plan Comments:        Anesthesia Quick Evaluation

## 2019-03-17 LAB — NOVEL CORONAVIRUS, NAA (HOSP ORDER, SEND-OUT TO REF LAB; TAT 18-24 HRS): SARS-CoV-2, NAA: NOT DETECTED

## 2019-03-18 MED ORDER — BUPIVACAINE LIPOSOME 1.3 % IJ SUSP
20.0000 mL | Freq: Once | INTRAMUSCULAR | Status: DC
  Filled 2019-03-18: qty 20

## 2019-03-19 ENCOUNTER — Other Ambulatory Visit: Payer: Self-pay

## 2019-03-19 ENCOUNTER — Encounter (HOSPITAL_COMMUNITY)
Admission: RE | Disposition: A | Discharge status: Home / Self Care | Payer: Self-pay | Source: Home / Self Care | Attending: Surgery

## 2019-03-19 ENCOUNTER — Ambulatory Visit (HOSPITAL_COMMUNITY): Payer: 59 | Admitting: Anesthesiology

## 2019-03-19 ENCOUNTER — Encounter (HOSPITAL_COMMUNITY): Payer: Self-pay

## 2019-03-19 ENCOUNTER — Observation Stay (HOSPITAL_COMMUNITY)
Admission: RE | Admit: 2019-03-19 | Discharge: 2019-03-20 | Disposition: A | Discharge status: Home / Self Care | Payer: 59 | Attending: Surgery | Admitting: Surgery

## 2019-03-19 ENCOUNTER — Ambulatory Visit (HOSPITAL_COMMUNITY): Payer: 59 | Admitting: Physician Assistant

## 2019-03-19 DIAGNOSIS — K7581 Nonalcoholic steatohepatitis (NASH): Secondary | ICD-10-CM | POA: Diagnosis not present

## 2019-03-19 DIAGNOSIS — E785 Hyperlipidemia, unspecified: Secondary | ICD-10-CM | POA: Diagnosis not present

## 2019-03-19 DIAGNOSIS — N182 Chronic kidney disease, stage 2 (mild): Secondary | ICD-10-CM | POA: Diagnosis not present

## 2019-03-19 DIAGNOSIS — I2510 Atherosclerotic heart disease of native coronary artery without angina pectoris: Secondary | ICD-10-CM | POA: Diagnosis not present

## 2019-03-19 DIAGNOSIS — K4120 Bilateral femoral hernia, without obstruction or gangrene, not specified as recurrent: Secondary | ICD-10-CM

## 2019-03-19 DIAGNOSIS — K4020 Bilateral inguinal hernia, without obstruction or gangrene, not specified as recurrent: Secondary | ICD-10-CM | POA: Diagnosis not present

## 2019-03-19 DIAGNOSIS — Z955 Presence of coronary angioplasty implant and graft: Secondary | ICD-10-CM | POA: Diagnosis not present

## 2019-03-19 DIAGNOSIS — K429 Umbilical hernia without obstruction or gangrene: Principal | ICD-10-CM

## 2019-03-19 DIAGNOSIS — N400 Enlarged prostate without lower urinary tract symptoms: Secondary | ICD-10-CM | POA: Diagnosis not present

## 2019-03-19 DIAGNOSIS — K432 Incisional hernia without obstruction or gangrene: Secondary | ICD-10-CM | POA: Diagnosis not present

## 2019-03-19 DIAGNOSIS — D176 Benign lipomatous neoplasm of spermatic cord: Secondary | ICD-10-CM | POA: Diagnosis not present

## 2019-03-19 DIAGNOSIS — Z8546 Personal history of malignant neoplasm of prostate: Secondary | ICD-10-CM | POA: Insufficient documentation

## 2019-03-19 DIAGNOSIS — Z87891 Personal history of nicotine dependence: Secondary | ICD-10-CM | POA: Insufficient documentation

## 2019-03-19 DIAGNOSIS — Z79899 Other long term (current) drug therapy: Secondary | ICD-10-CM | POA: Insufficient documentation

## 2019-03-19 DIAGNOSIS — K4090 Unilateral inguinal hernia, without obstruction or gangrene, not specified as recurrent: Secondary | ICD-10-CM | POA: Diagnosis not present

## 2019-03-19 DIAGNOSIS — Z791 Long term (current) use of non-steroidal anti-inflammatories (NSAID): Secondary | ICD-10-CM | POA: Insufficient documentation

## 2019-03-19 DIAGNOSIS — Z888 Allergy status to other drugs, medicaments and biological substances status: Secondary | ICD-10-CM | POA: Insufficient documentation

## 2019-03-19 DIAGNOSIS — I129 Hypertensive chronic kidney disease with stage 1 through stage 4 chronic kidney disease, or unspecified chronic kidney disease: Secondary | ICD-10-CM | POA: Diagnosis not present

## 2019-03-19 DIAGNOSIS — G4700 Insomnia, unspecified: Secondary | ICD-10-CM | POA: Insufficient documentation

## 2019-03-19 DIAGNOSIS — Z7982 Long term (current) use of aspirin: Secondary | ICD-10-CM | POA: Diagnosis not present

## 2019-03-19 DIAGNOSIS — M1990 Unspecified osteoarthritis, unspecified site: Secondary | ICD-10-CM | POA: Insufficient documentation

## 2019-03-19 HISTORY — PX: INGUINAL HERNIA REPAIR: SHX194

## 2019-03-19 HISTORY — PX: INCISIONAL HERNIA REPAIR: SHX193

## 2019-03-19 SURGERY — REPAIR, HERNIA, INCISIONAL, LAPAROSCOPIC
Anesthesia: General | Site: Abdomen | Laterality: Right

## 2019-03-19 MED ORDER — LIDOCAINE 2% (20 MG/ML) 5 ML SYRINGE
INTRAMUSCULAR | Status: DC | PRN
  Administered 2019-03-19: 100 mg via INTRAVENOUS

## 2019-03-19 MED ORDER — ONDANSETRON HCL 4 MG/2ML IJ SOLN
4.0000 mg | Freq: Four times a day (QID) | INTRAMUSCULAR | Status: DC | PRN
  Administered 2019-03-19: 20:00:00 4 mg via INTRAVENOUS
  Filled 2019-03-19: qty 2

## 2019-03-19 MED ORDER — ACETAMINOPHEN 500 MG PO TABS
1000.0000 mg | ORAL_TABLET | Freq: Three times a day (TID) | ORAL | Status: DC
  Administered 2019-03-19 – 2019-03-20 (×3): 1000 mg via ORAL
  Filled 2019-03-19 (×4): qty 2

## 2019-03-19 MED ORDER — SODIUM CHLORIDE 0.9% FLUSH: 3.0000 mL | INTRAVENOUS | Status: DC | PRN

## 2019-03-19 MED ORDER — VITAMIN D 25 MCG (1000 UNIT) PO TABS
5000.0000 [IU] | ORAL_TABLET | Freq: Every evening | ORAL | Status: DC
  Administered 2019-03-19: 17:00:00 5000 [IU] via ORAL
  Filled 2019-03-19: qty 5

## 2019-03-19 MED ORDER — COQ10 100 MG PO CAPS: 100.0000 mg | ORAL_CAPSULE | Freq: Every evening | ORAL | Status: DC

## 2019-03-19 MED ORDER — SODIUM CHLORIDE 0.9 % IV SOLN: 250.0000 mL | INTRAVENOUS | Status: DC | PRN

## 2019-03-19 MED ORDER — LACTATED RINGERS IV SOLN
INTRAVENOUS | Status: DC
  Administered 2019-03-19 (×2): via INTRAVENOUS

## 2019-03-19 MED ORDER — ONDANSETRON HCL 4 MG/2ML IJ SOLN
INTRAMUSCULAR | Status: DC | PRN
  Administered 2019-03-19: 4 mg via INTRAVENOUS

## 2019-03-19 MED ORDER — ROCURONIUM BROMIDE 10 MG/ML (PF) SYRINGE
PREFILLED_SYRINGE | INTRAVENOUS | Status: DC | PRN
  Administered 2019-03-19: 20 mg via INTRAVENOUS
  Administered 2019-03-19: 10 mg via INTRAVENOUS
  Administered 2019-03-19: 50 mg via INTRAVENOUS
  Administered 2019-03-19 (×3): 10 mg via INTRAVENOUS

## 2019-03-19 MED ORDER — HYDROMORPHONE HCL 1 MG/ML IJ SOLN
0.5000 mg | INTRAMUSCULAR | Status: DC | PRN
  Administered 2019-03-19: 14:00:00 0.5 mg via INTRAVENOUS
  Administered 2019-03-19: 1 mg via INTRAVENOUS
  Administered 2019-03-19 (×2): 0.5 mg via INTRAVENOUS
  Filled 2019-03-19: qty 1

## 2019-03-19 MED ORDER — MAGIC MOUTHWASH
15.0000 mL | Freq: Four times a day (QID) | ORAL | Status: DC | PRN
  Filled 2019-03-19: qty 15

## 2019-03-19 MED ORDER — HYDROMORPHONE HCL 1 MG/ML IJ SOLN
INTRAMUSCULAR | Status: AC
  Filled 2019-03-19: qty 1

## 2019-03-19 MED ORDER — FENTANYL CITRATE (PF) 100 MCG/2ML IJ SOLN
INTRAMUSCULAR | Status: AC
  Filled 2019-03-19: qty 2

## 2019-03-19 MED ORDER — BUPIVACAINE-EPINEPHRINE 0.25% -1:200000 IJ SOLN
INTRAMUSCULAR | Status: DC | PRN
  Administered 2019-03-19: 60 mL

## 2019-03-19 MED ORDER — ROSUVASTATIN CALCIUM 20 MG PO TABS
20.0000 mg | ORAL_TABLET | Freq: Every day | ORAL | Status: DC
  Administered 2019-03-19: 20 mg via ORAL
  Filled 2019-03-19: qty 1

## 2019-03-19 MED ORDER — CEFAZOLIN SODIUM-DEXTROSE 2-4 GM/100ML-% IV SOLN
2.0000 g | INTRAVENOUS | Status: AC
  Administered 2019-03-19: 2 g via INTRAVENOUS
  Filled 2019-03-19: qty 100

## 2019-03-19 MED ORDER — CHLORHEXIDINE GLUCONATE CLOTH 2 % EX PADS: 6.0000 | MEDICATED_PAD | Freq: Once | CUTANEOUS | Status: DC

## 2019-03-19 MED ORDER — LIP MEDEX EX OINT
1.0000 "application " | TOPICAL_OINTMENT | Freq: Two times a day (BID) | CUTANEOUS | Status: DC
  Administered 2019-03-19 – 2019-03-20 (×2): 1 via TOPICAL
  Filled 2019-03-19: qty 7

## 2019-03-19 MED ORDER — LOSARTAN POTASSIUM 25 MG PO TABS
25.0000 mg | ORAL_TABLET | Freq: Every day | ORAL | Status: DC
  Administered 2019-03-19: 22:00:00 25 mg via ORAL
  Filled 2019-03-19: qty 1

## 2019-03-19 MED ORDER — LIDOCAINE 2% (20 MG/ML) 5 ML SYRINGE
INTRAMUSCULAR | Status: AC
  Filled 2019-03-19: qty 5

## 2019-03-19 MED ORDER — PROPOFOL 10 MG/ML IV BOLUS
INTRAVENOUS | Status: DC | PRN
  Administered 2019-03-19: 150 mg via INTRAVENOUS

## 2019-03-19 MED ORDER — GABAPENTIN 300 MG PO CAPS
300.0000 mg | ORAL_CAPSULE | ORAL | Status: AC
  Administered 2019-03-19: 08:00:00 300 mg via ORAL
  Filled 2019-03-19: qty 1

## 2019-03-19 MED ORDER — SIMETHICONE 80 MG PO CHEW: 40.0000 mg | CHEWABLE_TABLET | Freq: Four times a day (QID) | ORAL | Status: DC | PRN

## 2019-03-19 MED ORDER — BUPIVACAINE LIPOSOME 1.3 % IJ SUSP
INTRAMUSCULAR | Status: DC | PRN
  Administered 2019-03-19: 20 mL

## 2019-03-19 MED ORDER — FENTANYL CITRATE (PF) 100 MCG/2ML IJ SOLN: 25.0000 ug | INTRAMUSCULAR | Status: DC | PRN

## 2019-03-19 MED ORDER — METHOCARBAMOL 1000 MG/10ML IJ SOLN
1000.0000 mg | Freq: Four times a day (QID) | INTRAVENOUS | Status: DC | PRN
  Administered 2019-03-19: 1000 mg via INTRAVENOUS
  Filled 2019-03-19 (×3): qty 10

## 2019-03-19 MED ORDER — NITROGLYCERIN 0.4 MG SL SUBL: 0.4000 mg | SUBLINGUAL_TABLET | SUBLINGUAL | Status: DC | PRN

## 2019-03-19 MED ORDER — ONDANSETRON 4 MG PO TBDP: 4.0000 mg | ORAL_TABLET | Freq: Four times a day (QID) | ORAL | Status: DC | PRN

## 2019-03-19 MED ORDER — METHOCARBAMOL 500 MG PO TABS: 750.0000 mg | ORAL_TABLET | Freq: Four times a day (QID) | ORAL | Status: DC | PRN

## 2019-03-19 MED ORDER — BUPIVACAINE-EPINEPHRINE 0.25% -1:200000 IJ SOLN
INTRAMUSCULAR | Status: AC
  Filled 2019-03-19: qty 2

## 2019-03-19 MED ORDER — ROCURONIUM BROMIDE 10 MG/ML (PF) SYRINGE
PREFILLED_SYRINGE | INTRAVENOUS | Status: AC
  Filled 2019-03-19: qty 10

## 2019-03-19 MED ORDER — FENTANYL CITRATE (PF) 100 MCG/2ML IJ SOLN
INTRAMUSCULAR | Status: DC | PRN
  Administered 2019-03-19: 50 ug via INTRAVENOUS
  Administered 2019-03-19: 100 ug via INTRAVENOUS
  Administered 2019-03-19: 50 ug via INTRAVENOUS

## 2019-03-19 MED ORDER — OXYCODONE HCL 5 MG PO TABS: 5.0000 mg | ORAL_TABLET | Freq: Four times a day (QID) | ORAL | 0 refills | Status: DC | PRN

## 2019-03-19 MED ORDER — DEXAMETHASONE SODIUM PHOSPHATE 10 MG/ML IJ SOLN
INTRAMUSCULAR | Status: AC
  Filled 2019-03-19: qty 1

## 2019-03-19 MED ORDER — OXYCODONE HCL 5 MG PO TABS
5.0000 mg | ORAL_TABLET | ORAL | Status: DC | PRN
  Administered 2019-03-19: 5 mg via ORAL
  Filled 2019-03-19: qty 1

## 2019-03-19 MED ORDER — ONDANSETRON HCL 4 MG/2ML IJ SOLN
INTRAMUSCULAR | Status: AC
  Filled 2019-03-19: qty 2

## 2019-03-19 MED ORDER — METOPROLOL TARTRATE 5 MG/5ML IV SOLN: 5.0000 mg | Freq: Four times a day (QID) | INTRAVENOUS | Status: DC | PRN

## 2019-03-19 MED ORDER — KETOROLAC TROMETHAMINE 15 MG/ML IJ SOLN
INTRAMUSCULAR | Status: AC
  Administered 2019-03-19: 15 mg via INTRAVENOUS
  Filled 2019-03-19: qty 1

## 2019-03-19 MED ORDER — GABAPENTIN 300 MG PO CAPS
300.0000 mg | ORAL_CAPSULE | Freq: Two times a day (BID) | ORAL | Status: DC
  Administered 2019-03-19 – 2019-03-20 (×2): 300 mg via ORAL
  Filled 2019-03-19 (×2): qty 1

## 2019-03-19 MED ORDER — LACTATED RINGERS IV SOLN: 1000.0000 mL | Freq: Three times a day (TID) | INTRAVENOUS | Status: DC | PRN

## 2019-03-19 MED ORDER — ENOXAPARIN SODIUM 40 MG/0.4ML ~~LOC~~ SOLN
40.0000 mg | SUBCUTANEOUS | Status: DC
  Administered 2019-03-20: 40 mg via SUBCUTANEOUS
  Filled 2019-03-19: qty 0.4

## 2019-03-19 MED ORDER — ACETAMINOPHEN 160 MG/5ML PO SOLN: 325.0000 mg | ORAL | Status: DC | PRN

## 2019-03-19 MED ORDER — DEXAMETHASONE SODIUM PHOSPHATE 10 MG/ML IJ SOLN
INTRAMUSCULAR | Status: DC | PRN
  Administered 2019-03-19: 4 mg via INTRAVENOUS

## 2019-03-19 MED ORDER — PROCHLORPERAZINE MALEATE 10 MG PO TABS: 10.0000 mg | ORAL_TABLET | Freq: Four times a day (QID) | ORAL | Status: DC | PRN

## 2019-03-19 MED ORDER — CELECOXIB 200 MG PO CAPS: 200.0000 mg | ORAL_CAPSULE | ORAL | Status: DC | PRN

## 2019-03-19 MED ORDER — ACETAMINOPHEN 500 MG PO TABS
1000.0000 mg | ORAL_TABLET | ORAL | Status: AC
  Administered 2019-03-19: 08:00:00 1000 mg via ORAL
  Filled 2019-03-19: qty 2

## 2019-03-19 MED ORDER — PROCHLORPERAZINE EDISYLATE 10 MG/2ML IJ SOLN: 5.0000 mg | Freq: Four times a day (QID) | INTRAMUSCULAR | Status: DC | PRN

## 2019-03-19 MED ORDER — METHOCARBAMOL 500 MG PO TABS: 1000.0000 mg | ORAL_TABLET | Freq: Four times a day (QID) | ORAL | Status: DC | PRN

## 2019-03-19 MED ORDER — 0.9 % SODIUM CHLORIDE (POUR BTL) OPTIME
TOPICAL | Status: DC | PRN
  Administered 2019-03-19: 1000 mL

## 2019-03-19 MED ORDER — DIPHENHYDRAMINE HCL 50 MG/ML IJ SOLN: 12.5000 mg | Freq: Four times a day (QID) | INTRAMUSCULAR | Status: DC | PRN

## 2019-03-19 MED ORDER — SUGAMMADEX SODIUM 200 MG/2ML IV SOLN
INTRAVENOUS | Status: DC | PRN
  Administered 2019-03-19: 150 mg via INTRAVENOUS

## 2019-03-19 MED ORDER — KETOROLAC TROMETHAMINE 15 MG/ML IJ SOLN
15.0000 mg | Freq: Once | INTRAMUSCULAR | Status: AC
  Administered 2019-03-19: 14:00:00 15 mg via INTRAVENOUS

## 2019-03-19 MED ORDER — MEPERIDINE HCL 50 MG/ML IJ SOLN: 6.2500 mg | INTRAMUSCULAR | Status: DC | PRN

## 2019-03-19 MED ORDER — ONDANSETRON HCL 4 MG/2ML IJ SOLN: 4.0000 mg | Freq: Once | INTRAMUSCULAR | Status: DC | PRN

## 2019-03-19 MED ORDER — OXYCODONE HCL 5 MG/5ML PO SOLN: 5.0000 mg | Freq: Once | ORAL | Status: DC | PRN

## 2019-03-19 MED ORDER — SODIUM CHLORIDE 0.9 % IV SOLN
INTRAVENOUS | Status: DC
  Administered 2019-03-19: 16:00:00 via INTRAVENOUS

## 2019-03-19 MED ORDER — ASPIRIN EC 81 MG PO TBEC
81.0000 mg | DELAYED_RELEASE_TABLET | Freq: Every evening | ORAL | Status: DC
  Administered 2019-03-19: 17:00:00 81 mg via ORAL
  Filled 2019-03-19: qty 1

## 2019-03-19 MED ORDER — POLYETHYLENE GLYCOL 3350 17 G PO PACK
17.0000 g | PACK | Freq: Every day | ORAL | Status: DC
  Administered 2019-03-20: 10:00:00 17 g via ORAL
  Filled 2019-03-19 (×2): qty 1

## 2019-03-19 MED ORDER — ACETAMINOPHEN 325 MG PO TABS: 325.0000 mg | ORAL_TABLET | ORAL | Status: DC | PRN

## 2019-03-19 MED ORDER — HYDROMORPHONE HCL 1 MG/ML IJ SOLN
INTRAMUSCULAR | Status: AC
  Administered 2019-03-19: 0.5 mg via INTRAVENOUS
  Filled 2019-03-19: qty 1

## 2019-03-19 MED ORDER — OXYCODONE HCL 5 MG PO TABS: 5.0000 mg | ORAL_TABLET | Freq: Once | ORAL | Status: DC | PRN

## 2019-03-19 MED ORDER — CEFAZOLIN SODIUM-DEXTROSE 2-4 GM/100ML-% IV SOLN
2.0000 g | Freq: Three times a day (TID) | INTRAVENOUS | Status: AC
  Administered 2019-03-19 (×2): 2 g via INTRAVENOUS
  Filled 2019-03-19 (×2): qty 100

## 2019-03-19 MED ORDER — PROPOFOL 10 MG/ML IV BOLUS
INTRAVENOUS | Status: AC
  Filled 2019-03-19: qty 20

## 2019-03-19 MED ORDER — BISACODYL 10 MG RE SUPP: 10.0000 mg | Freq: Two times a day (BID) | RECTAL | Status: DC | PRN

## 2019-03-19 MED ORDER — SODIUM CHLORIDE 0.9% FLUSH: 3.0000 mL | Freq: Two times a day (BID) | INTRAVENOUS | Status: DC

## 2019-03-19 MED ORDER — POLYETHYLENE GLYCOL 3350 17 G PO PACK: 17.0000 g | PACK | Freq: Two times a day (BID) | ORAL | Status: DC | PRN

## 2019-03-19 MED ORDER — EPHEDRINE SULFATE-NACL 50-0.9 MG/10ML-% IV SOSY
PREFILLED_SYRINGE | INTRAVENOUS | Status: DC | PRN
  Administered 2019-03-19: 10 mg via INTRAVENOUS

## 2019-03-19 MED ORDER — DIPHENHYDRAMINE HCL 12.5 MG/5ML PO ELIX: 12.5000 mg | ORAL_SOLUTION | Freq: Four times a day (QID) | ORAL | Status: DC | PRN

## 2019-03-19 MED FILL — oxyCODONE HCL 5 MG TABS: 5 | 4 days supply | Qty: 30 | Fill #0

## 2019-03-19 SURGICAL SUPPLY — 50 items
APPLIER CLIP 5 13 M/L LIGAMAX5 (MISCELLANEOUS)
BINDER ABDOMINAL 12 ML 46-62 (SOFTGOODS) ×4 IMPLANT
CABLE HIGH FREQUENCY MONO STRZ (ELECTRODE) ×4 IMPLANT
CHLORAPREP W/TINT 26 (MISCELLANEOUS) ×4 IMPLANT
CLIP APPLIE 5 13 M/L LIGAMAX5 (MISCELLANEOUS) IMPLANT
CLOSURE WOUND 1/2 X4 (GAUZE/BANDAGES/DRESSINGS) ×2
COVER SURGICAL LIGHT HANDLE (MISCELLANEOUS) ×4 IMPLANT
COVER WAND RF STERILE (DRAPES) ×4 IMPLANT
DECANTER SPIKE VIAL GLASS SM (MISCELLANEOUS) ×4 IMPLANT
DEVICE SECURE STRAP 25 ABSORB (INSTRUMENTS) ×4 IMPLANT
DEVICE TROCAR PUNCTURE CLOSURE (ENDOMECHANICALS) ×4 IMPLANT
DRAPE WARM FLUID 44X44 (DRAPES) ×4 IMPLANT
DRSG TEGADERM 2-3/8X2-3/4 SM (GAUZE/BANDAGES/DRESSINGS) ×12 IMPLANT
DRSG TEGADERM 4X4.75 (GAUZE/BANDAGES/DRESSINGS) ×4 IMPLANT
ELECT REM PT RETURN 15FT ADLT (MISCELLANEOUS) ×4 IMPLANT
GAUZE SPONGE 2X2 8PLY STRL LF (GAUZE/BANDAGES/DRESSINGS) ×2 IMPLANT
GLOVE ECLIPSE 8.0 STRL XLNG CF (GLOVE) ×4 IMPLANT
GLOVE INDICATOR 8.0 STRL GRN (GLOVE) ×4 IMPLANT
GOWN STRL REUS W/TWL XL LVL3 (GOWN DISPOSABLE) ×8 IMPLANT
IRRIG SUCT STRYKERFLOW 2 WTIP (MISCELLANEOUS) ×4
IRRIGATION SUCT STRKRFLW 2 WTP (MISCELLANEOUS) ×2 IMPLANT
KIT BASIN OR (CUSTOM PROCEDURE TRAY) ×4 IMPLANT
KIT TURNOVER KIT A (KITS) IMPLANT
MARKER SKIN DUAL TIP RULER LAB (MISCELLANEOUS) ×4 IMPLANT
MESH ULTRAPRO 6X6 15CM15CM (Mesh General) ×8 IMPLANT
MESH VENTRALIGHT ST 8X10 (Mesh General) ×4 IMPLANT
NEEDLE INSUFFLATION 14GA 120MM (NEEDLE) ×4 IMPLANT
NEEDLE SPNL 22GX3.5 QUINCKE BK (NEEDLE) ×4 IMPLANT
PAD POSITIONING PINK XL (MISCELLANEOUS) ×4 IMPLANT
SCISSORS LAP 5X35 DISP (ENDOMECHANICALS) ×4 IMPLANT
SET TUBE SMOKE EVAC HIGH FLOW (TUBING) ×4 IMPLANT
SLEEVE ADV FIXATION 5X100MM (TROCAR) ×8 IMPLANT
SPONGE GAUZE 2X2 STER 10/PKG (GAUZE/BANDAGES/DRESSINGS) ×2
STRIP CLOSURE SKIN 1/2X4 (GAUZE/BANDAGES/DRESSINGS) ×6 IMPLANT
SUT MNCRL AB 4-0 PS2 18 (SUTURE) ×4 IMPLANT
SUT PDS AB 1 CT1 27 (SUTURE) ×8 IMPLANT
SUT PROLENE 1 CT 1 30 (SUTURE) ×24 IMPLANT
SUT VIC AB 1 CT1 27 (SUTURE) ×2
SUT VIC AB 1 CT1 27XBRD ANTBC (SUTURE) ×2 IMPLANT
SUT VIC AB 2-0 SH 27 (SUTURE) ×4
SUT VIC AB 2-0 SH 27X BRD (SUTURE) ×4 IMPLANT
SUT VICRYL 0 UR6 27IN ABS (SUTURE) ×4 IMPLANT
TACKER 5MM HERNIA 3.5CML NAB (ENDOMECHANICALS) IMPLANT
TOWEL OR 17X26 10 PK STRL BLUE (TOWEL DISPOSABLE) ×4 IMPLANT
TOWEL OR NON WOVEN STRL DISP B (DISPOSABLE) ×4 IMPLANT
TRAY LAPAROSCOPIC (CUSTOM PROCEDURE TRAY) ×4 IMPLANT
TROCAR ADV FIXATION 11X100MM (TROCAR) IMPLANT
TROCAR ADV FIXATION 5X100MM (TROCAR) ×4 IMPLANT
TROCAR BLADELESS OPT 5 100 (ENDOMECHANICALS) ×4 IMPLANT
TROCAR XCEL BLUNT TIP 100MML (ENDOMECHANICALS) IMPLANT

## 2019-03-19 NOTE — Progress Notes (Signed)
Pt ambulated with ease to bed

## 2019-03-19 NOTE — H&P (Signed)
William Montgomery  DOB: 04-20-1941 Married / Language: Cleophus Molt / Race: White Male   Patient Care Team: Tammi Sou, MD as PCP - General Minus Breeding, MD as PCP - Cardiology (Cardiology) Raynelle Bring, MD as Consulting Physician (Urology) Minus Breeding, MD as Consulting Physician (Cardiology) End, Harrell Gave, MD as Consulting Physician (Cardiology) Haverstock, Jennefer Bravo, MD as Referring Physician (Dermatology) Alexis Frock, MD as Consulting Physician (Urology) Michael Boston, MD as Consulting Physician (General Surgery) Danis, Kirke Corin, MD as Consulting Physician (Gastroenterology)  ` ` Patient sent for surgical consultation at the request of Dr Anitra Lauth  Chief Complaint: Incisional hernia right lower quadrant port site. ` ` The patient is a pleasant active male with stable coronary disease. He required a robotic prostatectomy by Dr. Raynelle Bring 2017. He had a periumbilical hernia which is primarily repaired at the time. Patient noticed bulging in his right lower abdomen and one of the robotic port sites. It is gradually gotten larger. Occasionally gets some burning and discomfort with it. 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 Becoming increasingly bothersome. He mentioned it to his primary care physician on his annual visit. Incisional hernia suspected. Surgical consultation offered.  Patient comes in today by himself. He moves his bowels once a day. Went up 25 flights of stairs yesterday without difficulty. Can walk at least a half hour without difficulty. He takes a baby aspirin but no other anticoagulants. He has some 80% stenosis of a few his coronary vessels by catheterization but sees Dr. Percival Spanish with cardiology. No major issues. Moves his bowels every day. Had an underwhelming colonoscopy 7 years ago. He had an appendectomy at age 49. No other abdominal surgeries. He is not a diabetic. He  does not smoke. He is not on any suppressive medications. No issues of urinary incontinence or difficulty. No urinary tract infections.  Ready for surgery.  Cleared by cardiology  (Review of systems as stated in this history (HPI) or in the review of systems. Otherwise all other 12 point ROS are negative) ` ` `   Allergies Emeline Gins, CMA; 07/09/2018 8:40 AM) No Known Drug Allergies [07/09/2018]: Allergies Reconciled   Medication History Emeline Gins, CMA; 07/09/2018 8:41 AM) Rosuvastatin Calcium (20MG Tablet, Oral) Active. Losartan Potassium (25MG Tablet, Oral) Active. Aspirin (81MG Tablet, Oral) Active. Coenzyme Q10 (100MG Capsule, Oral) Active. Celecoxib (200MG Capsule, Oral) Active. Amitriptyline HCl (25MG Tablet, Oral) Active. Medications Reconciled  Vitals Emeline Gins CMA; 07/09/2018 8:40 AM) 07/09/2018 8:39 AM Weight: 180.2 lb Height: 68in Body Surface Area: 1.96 m Body Mass Index: 27.4 kg/m  Temp.: 38F  Pulse: 68 (Regular)  BP: 142/78 (Sitting, Left Arm, Standard)  03/19/2019 BP 125/74   Pulse (!) 58   Temp 98.1 F (36.7 C) (Oral)   Resp 18   SpO2 99%       Physical ExamGeneral Mental Status-Alert. General Appearance-Not in acute distress, Not Sickly. Orientation-Oriented X3. Hydration-Well hydrated. Voice-Normal.  Integumentary Global Assessment Upon inspection and palpation of skin surfaces of the - Axillae: non-tender, no inflammation or ulceration, no drainage. and Distribution of scalp and body hair is normal. General Characteristics Temperature - normal warmth is noted.  Head and Neck Head-normocephalic, atraumatic with no lesions or palpable masses. Face Global Assessment - atraumatic, no absence of expression. Neck Global Assessment - no abnormal movements, no bruit auscultated on the right, no bruit auscultated on the left, no decreased range of motion, non-tender.  Trachea-midline. Thyroid Gland  Characteristics - non-tender.  Eye Eyeball - Left-Extraocular movements intact, No Nystagmus. Eyeball - Right-Extraocular movements intact, No Nystagmus. Cornea - Left-No Hazy. Cornea - Right-No Hazy. Sclera/Conjunctiva - Left-No scleral icterus, No Discharge. Sclera/Conjunctiva - Right-No scleral icterus, No Discharge. Pupil - Left-Direct reaction to light normal. Pupil - Right-Direct reaction to light normal.  ENMT Ears Pinna - Left - no drainage observed, no generalized tenderness observed. Right - no drainage observed, no generalized tenderness observed. Nose and Sinuses External Inspection of the Nose - no destructive lesion observed. Inspection of the nares - Left - quiet respiration. Right - quiet respiration. Mouth and Throat Lips - Upper Lip - no fissures observed, no pallor noted. Lower Lip - no fissures observed, no pallor noted. Nasopharynx - no discharge present. Oral Cavity/Oropharynx - Tongue - no dryness observed. Oral Mucosa - no cyanosis observed. Hypopharynx - no evidence of airway distress observed.  Chest and Lung Exam Inspection Movements - Normal and Symmetrical. Accessory muscles - No use of accessory muscles in breathing. Palpation Palpation of the chest reveals - Non-tender. Auscultation Breath sounds - Normal and Clear.  Cardiovascular Auscultation Rhythm - Regular. Murmurs & Other Heart Sounds - Auscultation of the heart reveals - No Murmurs and No Systolic Clicks.  Abdomen Inspection Inspection of the abdomen reveals - No Visible peristalsis and No Abnormal pulsations. Umbilicus - No Bleeding, No Urine drainage. Palpation/Percussion Palpation and Percussion of the abdomen reveal - Soft, Non Tender, No Rebound tenderness, No Rigidity (guarding) and No Cutaneous hyperesthesia. Note: Right lower quadrant 6 x 5 cm bulging reducing down to a smaller defect at his prior robotic port site.   Umbilical vertical incision. I can feel stitches but no obvious hernia there. Moderate diastases recti. No left-sided ports. Nontender.   Male Genitourinary Sexual Maturity Tanner 5 - Adult hair pattern and Adult penile size and shape. Note: Obvious right inguinal hernia. Possible impulse on left groin is well suspicious for small left inguinal hernia. Otherwise normal external male genitalia. No testicular masses.   Peripheral Vascular Upper Extremity Inspection - Left - No Cyanotic nailbeds, Not Ischemic. Right - No Cyanotic nailbeds, Not Ischemic.  Neurologic Neurologic evaluation reveals -normal attention span and ability to concentrate, able to name objects and repeat phrases. Appropriate fund of knowledge , normal sensation and normal coordination. Mental Status Affect - not angry, not paranoid. Cranial Nerves-Normal Bilaterally. Gait-Normal.  Neuropsychiatric Mental status exam performed with findings of-able to articulate well with normal speech/language, rate, volume and coherence, thought content normal with ability to perform basic computations and apply abstract reasoning and no evidence of hallucinations, delusions, obsessions or homicidal/suicidal ideation.  Musculoskeletal Global Assessment Spine, Ribs and Pelvis - no instability, subluxation or laxity. Right Upper Extremity - no instability, subluxation or laxity.  Lymphatic Head & Neck  General Head & Neck Lymphatics: Bilateral - Description - No Localized lymphadenopathy. Axillary  General Axillary Region: Bilateral - Description - No Localized lymphadenopathy. Femoral & Inguinal  Generalized Femoral & Inguinal Lymphatics: Left - Description - No Localized lymphadenopathy. Right - Description - No Localized lymphadenopathy.    Assessment & Plan INCISIONAL HERNIA, WITHOUT OBSTRUCTION OR GANGRENE (K43.2) Impression: Right lower quadrant incisional hernia at port site from robotic  prostatectomy. No evidence of any recurrent periumbilical hernia after a primary repair at the same time. Obvious right inguinal hernia and possible left inguinal hernia.  I think he would benefit from surgical repair.  Pt ready to consider surgery  He has good exercise tolerance but does have  a history coronary disease. Cleared by his cardiologist, Dr. Percival Spanish. Patient sees him rather regularly. Catheterization showed stable disease.  Overnight stay given the multiple hernia locations even though not particularly large. We will see.  He's hoping to get back to work within 2 weeks. I cautioned that would be somewhat ambitious for at least light/moderate duty. At least 6 weeks before complete unrestricted activity. He understands. RIGHT INGUINAL HERNIA (K40.90) Impression: Obvious right inguinal hernia. Possible contralateral left inguinal hernia as well. Recommended laparoscopic exploration repair of hernias found. Because he has incisional hernia in the right lower quadrant, most likely this will be a TAPP like approach.  Current Plans The anatomy & physiology of the abdominal wall and pelvic floor was discussed. The pathophysiology of hernias in the inguinal and pelvic region was discussed. Natural history risks such as progressive enlargement, pain, incarceration, and strangulation was discussed. Contributors to complications such as smoking, obesity, diabetes, prior surgery, etc were discussed.  I feel the risks of no intervention will lead to serious problems that outweigh the operative risks; therefore, I recommended surgery to reduce and repair the hernia. I explained laparoscopic techniques with possible need for an open approach. I noted usual use of mesh to patch and/or buttress hernia repair  Risks such as bleeding, infection, abscess, need for further treatment, heart attack, death, and other risks were discussed. I noted a good likelihood this will help address the  problem. Goals of post-operative recovery were discussed as well. Possibility that this will not correct all symptoms was explained. I stressed the importance of low-impact activity, aggressive pain control, avoiding constipation, &not pushing through pain to minimize risk of post-operative chronic pain or injury. Possibility of reherniation was discussed. We will work to minimize complications.  An educational handout further explaining the pathology & treatment options was given as well. Questions were answered. The patient expresses understanding &wishes to proceed with surgery.    Adin Hector, MD, FACS, MASCRS Gastrointestinal and Minimally Invasive Surgery    [address] [place], [place] [address] [phone] Main / Paging [phone] Fax

## 2019-03-19 NOTE — Anesthesia Procedure Notes (Signed)
Procedure Name: Intubation Date/Time: 03/19/2019 9:21 AM Performed by: Niel Hummer, CRNA Pre-anesthesia Checklist: Patient identified, Emergency Drugs available, Suction available and Patient being monitored Patient Re-evaluated:Patient Re-evaluated prior to induction Oxygen Delivery Method: Circle system utilized Preoxygenation: Pre-oxygenation with 100% oxygen Induction Type: IV induction Ventilation: Mask ventilation without difficulty Laryngoscope Size: Mac and 4 Grade View: Grade I Tube type: Oral Tube size: 7.5 mm Number of attempts: 1 Airway Equipment and Method: Stylet Placement Confirmation: ETT inserted through vocal cords under direct vision,  positive ETCO2 and breath sounds checked- equal and bilateral Secured at: 22 cm Tube secured with: Tape Dental Injury: Teeth and Oropharynx as per pre-operative assessment

## 2019-03-19 NOTE — Transfer of Care (Signed)
Immediate Anesthesia Transfer of Care Note  Patient: William Montgomery  Procedure(s) Performed: LAPAROSCOPIC INCISIONAL HERNIA REPAIR WITH MESH, RECURRENT UMBILICAL HERNIA REPAIR (N/A Abdomen) LAPAROSCOPIC BILATERAL FEMORAL AND INGUINAL HERNIA REPAIR WITH MESH, LYSIS OF ADHESIONS (Right Abdomen)  Patient Location: PACU  Anesthesia Type:General  Level of Consciousness: awake, alert  and oriented  Airway & Oxygen Therapy: Patient Spontanous Breathing and Patient connected to face mask oxygen  Post-op Assessment: Report given to RN, Post -op Vital signs reviewed and stable and Patient moving all extremities X 4  Post vital signs: Reviewed and stable  Last Vitals:  Vitals Value Taken Time  BP    Temp    Pulse    Resp 12 03/19/19 1307  SpO2    Vitals shown include unvalidated device data.  Last Pain:  Vitals:   03/19/19 0709  TempSrc: Oral         Complications: No apparent anesthesia complications

## 2019-03-19 NOTE — Anesthesia Postprocedure Evaluation (Signed)
Anesthesia Post Note  Patient: William Montgomery  Procedure(s) Performed: LAPAROSCOPIC INCISIONAL HERNIA REPAIR WITH MESH, RECURRENT UMBILICAL HERNIA REPAIR (N/A Abdomen) LAPAROSCOPIC BILATERAL FEMORAL AND INGUINAL HERNIA REPAIR WITH MESH, LYSIS OF ADHESIONS (Right Abdomen)     Patient location during evaluation: PACU Anesthesia Type: General Level of consciousness: awake Pain management: pain level controlled Vital Signs Assessment: post-procedure vital signs reviewed and stable Respiratory status: spontaneous breathing Cardiovascular status: stable Postop Assessment: no apparent nausea or vomiting Anesthetic complications: no    Last Vitals:  Vitals:   03/19/19 1430 03/19/19 1457  BP:  127/75  Pulse:  (!) 59  Resp:  12  Temp: 37.1 C 36.6 C  SpO2:  97%    Last Pain:  Vitals:   03/19/19 1457  TempSrc: Oral  PainSc:    Pain Goal:                   Huston Foley

## 2019-03-19 NOTE — Op Note (Signed)
03/19/2019  12:59 PM  PATIENT:  William Montgomery  78 y.o. male  Patient Care Team: Tammi Sou, MD as PCP - General Minus Breeding, MD as PCP - Cardiology (Cardiology) Raynelle Bring, MD as Consulting Physician (Urology) Minus Breeding, MD as Consulting Physician (Cardiology) End, Harrell Gave, MD as Consulting Physician (Cardiology) Haverstock, Jennefer Bravo, MD as Referring Physician (Dermatology) Alexis Frock, MD as Consulting Physician (Urology) Michael Boston, MD as Consulting Physician (General Surgery) Danis, Kirke Corin, MD as Consulting Physician (Gastroenterology)  PRE-OPERATIVE DIAGNOSIS:  INCISIONAL HERNIA, RIGHT INGUINAL HERNIA  POST-OPERATIVE DIAGNOSIS:   INCISIONAL HERNIA BILATERAL FEMORAL HERNIAS BILATERAL INGUINAL HERNIAS RECURRENT UMBILICAL HERNIA  PROCEDURE:   LAPAROSCOPIC INCISIONAL HERNIA REPAIR WITH MESH LAPAROSCOPIC RECURRENT UMBILICAL HERNIA REPAIR WITH MESH LAPAROSCOPIC BILATERAL FEMORAL AND INGUINAL HERNIA REPAIRS WITH MESH TAP BLOCK - BILATERAL LAPAROSCOPIC LYSIS OF ADHESIONS X 2 HOURS  SURGEON:  Adin Hector, MD  ASSISTANT: None  ANESTHESIA:     Regional ilioinguinal and genitofemoral and spermatic cord nerve blocks  General  Nerve block provided with liposomal bupivacaine (Experel) mixed with 0.25% bupivacaine as a Bilateral TAP block x 62m each side at the level of the transverse abdominis & preperitoneal spaces along the flank at the anterior axillary line, from subcostal ridge to iliac crest under laparoscopic guidance    EBL:  Total I/O In: 1000 [I.V.:1000] Out: - .  See anesthesia record  Delay start of Pharmacological VTE agent (>24hrs) due to surgical blood loss or risk of bleeding:  no  DRAINS: NONE  SPECIMEN:  NONE  DISPOSITION OF SPECIMEN:  N/A  COUNTS:  YES  PLAN OF CARE: Discharge to home after PACU  PATIENT DISPOSITION:  PACU - hemodynamically stable.  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  Developed incisional hernia in right lower quadrant.  At least right inguinal and possible left inguinal hernias as well.  History of umbilical hernia primarily repaired with possible small recurrence.  I recommended laparoscopic possible open exploration and repair of hernias found  The anatomy & physiology of the abdominal wall and pelvic floor was discussed.  The pathophysiology of hernias in the inguinal and pelvic region was discussed.  Natural history risks such as progressive enlargement, pain, incarceration & strangulation was discussed.   Contributors to complications such as smoking, obesity, diabetes, prior surgery, etc were discussed.    I feel the risks of no intervention will lead to serious problems that outweigh the operative risks; therefore, I recommended surgery to reduce and repair the hernia.  I explained laparoscopic techniques with possible need for an open approach.  I noted usual use of mesh to patch and/or buttress hernia repair  Risks such as bleeding, infection, abscess, need for further treatment, heart attack, death, and other risks were discussed.  I noted a good likelihood this will help address the problem.   Goals of post-operative recovery were discussed as well.  Possibility that this will not correct all symptoms was explained.  I stressed the importance of low-impact activity, aggressive pain control, avoiding constipation, & not pushing through pain to minimize risk of post-operative chronic pain or injury. Possibility of reherniation was discussed.  We will work to minimize complications.     An educational handout further explaining the pathology & treatment options was given as well.  Questions were answered.  The patient expresses understanding & wishes to proceed with surgery.  OR FINDINGS: 5 x 4 cm incisional hernia on right lower quadrant.  2 x 1 cm umbilical hernia recurrent and umbilical stalk.  Type of  repair: Laparoscopic underlay repair   Placement of mesh: Preperitoneal infraumbilical.  Intraperitoneal right flank and supraumbilical  Name of mesh: Bard Ventralight dual sided (polypropylene / Seprafilm)  Size of mesh: 25x20cm  Orientation: Transverse  Mesh overlap:  5cm    Right indirect inguinal and femoral hernias.  No obvious direct space or obturator hernia.  Left indirect and femoral hernias.  Mild direct space inguinal hernia as well.  No obturator hernia.  DESCRIPTION:  The patient was identified & brought into the operating room. The patient was positioned supine with arms tucked. SCDs were active during the entire case. The patient underwent general anesthesia without any difficulty.  The abdomen was prepped and draped in a sterile fashion. The patient's bladder was emptied.  A Surgical Timeout confirmed our plan.   Informed consent was confirmed. The patient underwent general anaesthesia without difficulty. The patient was positioned appropriately. VTE prevention in place. The patient's abdomen was clipped, prepped, & draped in a sterile fashion. Surgical timeout confirmed our plan.  The patient was positioned in reverse Trendelenburg. Abdominal entry was gained using various technique along the left subcostal ridge.  I described insufflation to 15 mmHg.  I then placed a 5 1 port in the left upper quadrant using optical entry technique. Entry was clean. I induced carbon dioxide insufflation. Camera inspection revealed no injury. Extra ports were carefully placed under direct laparoscopic visualization.   I could see adhesions on the parietal peritoneum under the abdominal wall.   I did laparoscopic lysis of adhesions to expose the entire anterior abdominal wall.  I primarily used focused sharp dissection.  With that I could identify the hernia in the right lower quadrant as well as at the umbilicus.  Omentum was adherent to it.  I carefully freed that down.  Could see a large  right inguinal and smaller but definite left inguinal hernias as well.  I focused on preperitoneal dissection of the groin hernias.  I did a TAPP technique since he also had incisional hernias in the lower abdomen.  I scored the peritoneum from the right mid flank to the left mid flank.  It came just superior to the right lower quadrant hernia.  I got into the preperitoneal space.  I did go into the rectus muscles a little bit given his prior preperitoneal dissection for his robotic prostatectomy.  I assured hemostasis.  Came down and identified obvious indirect inguinal hernias on both sides.  He had rather dense adhesions of the dome of his bladder to the superior pubic rami rim.  I carefully freed these off with focus hydrodissection and sharp dissection.  I   I focused attention on the RIGHT pelvis since that was the dominant hernia side.   I used blunt & focused sharp dissection to free the peritoneum off the flank and down to the pubic rim.  I freed the anteriolateral bladder wall off the anteriolateral pelvic wall, sparing midline attachments.   I located a swath of peritoneum going into a hernia fascial defect at the  internal ring consistent with  an indirect inguinal hernia..  I gradually freed the peritoneal hernia sac off safely and reduced it into the preperitoneal space.  I freed the peritoneum off the spermatic vessels & vas deferens.  I freed peritoneum off the retroperitoneum along the psoas muscle.  Spermatic cord lipoma was dissected away & removed.  I checked & assured hemostasis.  Patient had an obvious femoral hernia as well.  I carefully freed peritoneum hernia and sac leaving the Hemoclip clips from the prior inguinal lymph node dissection in place.  As anticipated, surgical dissection was challenged by dense adhesions and poor planes resulting in the need for careful repair of the resulting peritoneal defects.  Repair was done with minimally invasive intracorporeal suturing using  absorbable suture.  I turned attention on the opposite  LEFT pelvis.  I did dissection in a similar, mirror-image fashion. The patient had indirect and direct inguinal hernias.  Also small but definite femoral hernia on the left side.  Spermatic cord lipoma was dissected away & removed.    I checked & assured hemostasis.  Peritoneal repair done as well.  I chose 15x15 cm sheets of ultra-lightweight polypropylene mesh (Ultrapro), one for each side.  I cut a single sigmoid-shaped slit ~6cm from a corner of each mesh.  I placed the meshes into the preperitoneal space & laid them as overlapping diamonds such that at the inferior points, a 6x6 cm corner flap rested in the true anterolateral pelvis, covering the obturator & femoral foramina.   I allowed the bladder to return to the pubis, this helping tuck the corners of the mesh in the anteriolateral pelvis.  The medial corners overlapped each other across midline cephalad to the pubic rim.    I then focused on the recurrent umbilical and right lower quadrant incisional hernias.  I mapped out the region using a needle passer.   To ensure that I would have at least 5 cm radial coverage outside of the hernia defect, I chose a 25x20cm dual sided mesh.  I placed #1 Prolene stitches around its edge about every 5 cm = 12 total.  I rolled the mesh & placed into the peritoneal cavity through the RLQ hernia defect.  I unrolled the mesh and positioned it appropriately.  I secured the mesh to cover up the hernia defect using a laparoscopic suture passer to pass the tails of the Prolene through the abdominal wall & tagged them with clamps for good transfascial suturing.  I started out in four corners to make sure I had the mesh centered under the hernia defect appropriately, and then proceeded to work in quadrants.    We evacuated CO2 & desufflated the abdomen.  I tied the fascial stitches down. I closed the right lower quadrant incisional hernia defect that I placed the  mesh through using #1 PDS running suture primarily.  I reinsufflated the abdomen. The mesh provided at least circumferential coverage around the entire region of hernia defects.   I secured the mesh centrally with an additional trans fascial stitch in & out the mesh using #1 PDS under laparoscopic visualization.  I used that to help bring up the peritoneum to the periumbilical region as well.  The dual sided mesh and the ultra pro mesh was overlapped in the infraumbilical region well.  I tacked the edges & central part of the mesh to the peritoneum/posterior rectus fascia with SecureStrap absorbable tacks.   I did reinspection. Hemostasis was good. Mesh laid well.  I evacuated carbon dioxide.  I closed the fascia with absorbable suture.  I closed the skin using 4-0 monocryl stitch.  Steri-Strips on the needle puncture sites.  Sterile dressings were applied.   The patient was extubated & arrived in the PACU in stable condition..  I had discussed postoperative care with the patient in the holding area.  Instructions are written in  the chart. I made an attempt to locate family to discuss patient's status and recommendations.  No one is available at this time.  I will try again later  Adin Hector, M.D., F.A.C.S. Gastrointestinal and Minimally Invasive Surgery [facility] [address], [place], [place] [address] [phone] Main / Paging  03/19/2019 12:59 PM

## 2019-03-19 NOTE — Discharge Instructions (Signed)
HERNIA REPAIR: POST OP INSTRUCTIONS  ######################################################################  EAT Gradually transition to a high fiber diet with a fiber supplement over the next few weeks after discharge.  Start with a pureed / full liquid diet (see below)  WALK Walk an hour a day.  Control your pain to do that.    CONTROL PAIN Control pain so that you can walk, sleep, tolerate sneezing/coughing, and go up/down stairs.  HAVE A BOWEL MOVEMENT DAILY Keep your bowels regular to avoid problems.  OK to try a laxative to override constipation.  OK to use an antidairrheal to slow down diarrhea.  Call if not better after 2 tries  CALL IF YOU HAVE PROBLEMS/CONCERNS Call if you are still struggling despite following these instructions. Call if you have concerns not answered by these instructions  ######################################################################    1. DIET: Follow a light bland diet & liquids the first 24 hours after arrival home, such as soup, liquids, starches, etc.  Be sure to drink plenty of fluids.  Quickly advance to a usual solid diet within a few days.  Avoid fast food or heavy meals as your are more likely to get nauseated or have irregular bowels.  A low-fat, high-fiber diet for the rest of your life is ideal.   2. Take your usually prescribed home medications unless otherwise directed.  3. PAIN CONTROL: a. Pain is best controlled by a usual combination of three different methods TOGETHER: i. Ice/Heat ii. Over the counter pain medication iii. Prescription pain medication b. Most patients will experience some swelling and bruising around the hernia(s) such as the bellybutton, groins, or old incisions.  Ice packs or heating pads (30-60 minutes up to 6 times a day) will help. Use ice for the first few days to help decrease swelling and bruising, then switch to heat to help relax tight/sore spots and speed recovery.  Some people prefer to use ice  alone, heat alone, alternating between ice & heat.  Experiment to what works for you.  Swelling and bruising can take several weeks to resolve.   c. It is helpful to take an over-the-counter pain medication regularly for the first few weeks.  Choose one of the following that works best for you: i. Naproxen (Aleve, etc)  Two 283m tabs twice a day ii. Ibuprofen (Advil, etc) Three 2048mtabs four times a day (every meal & bedtime) iii. Acetaminophen (Tylenol, etc) 325-650103mour times a day (every meal & bedtime) d. A  prescription for pain medication should be given to you upon discharge.  Take your pain medication as prescribed.  i. If you are having problems/concerns with the prescription medicine (does not control pain, nausea, vomiting, rash, itching, etc), please call us [phone] see if we need to switch you to a different pain medicine that will work better for you and/or control your side effect better. ii. If you need a refill on your pain medication, please contact your pharmacy.  They will contact our office to request authorization. Prescriptions will not be filled after 5 pm or on week-ends.  4. Avoid getting constipated.  Between the surgery and the pain medications, it is common to experience some constipation.  Increasing fluid intake and taking a fiber supplement (such as Metamucil, Citrucel, FiberCon, MiraLax, etc) 1-2 times a day regularly will usually help prevent this problem from occurring.  A mild laxative (prune juice, Milk of Magnesia, MiraLax, etc) should be taken according to package directions if there are no bowel movements after 48  hours.    5. Wash / shower every day.  You may shower over the dressings as they are waterproof.    6. Remove your waterproof bandages, skin tapes, and other bandages 5 days after surgery. You may replace a dressing/Band-Aid to cover the incision for comfort if you wish. You may leave the incisions open to air.  You may replace a  dressing/Band-Aid to cover an incision for comfort if you wish.  Continue to shower over incision(s) after the dressing is off.  7. ACTIVITIES as tolerated:   a. You may resume regular (light) daily activities beginning the next day--such as daily self-care, walking, climbing stairs--gradually increasing activities as tolerated.  Control your pain so that you can walk an hour a day.  If you can walk 30 minutes without difficulty, it is safe to try more intense activity such as jogging, treadmill, bicycling, low-impact aerobics, swimming, etc. b. Save the most intensive and strenuous activity for last such as sit-ups, heavy lifting, contact sports, etc  Refrain from any heavy lifting or straining until you are off narcotics for pain control.   c. DO NOT PUSH THROUGH PAIN.  Let pain be your guide: If it hurts to do something, don't do it.  Pain is your body warning you to avoid that activity for another week until the pain goes down. d. You may drive when you are no longer taking prescription pain medication, you can comfortably wear a seatbelt, and you can safely maneuver your car and apply brakes. e. Dennis Bast may have sexual intercourse when it is comfortable.   8. FOLLOW UP in our office a. Please call [facility] at [phone] to set up an appointment to see your surgeon in the office for a follow-up appointment approximately 2-3 weeks after your surgery. b. Make sure that you call for this appointment the day you arrive home to insure a convenient appointment time.  9.  If you have disability of FMLA / Family leave forms, please bring the forms to the office for processing.  (do not give to your surgeon).  WHEN TO CALL us [phone]: 1. Poor pain control 2. Reactions / problems with new medications (rash/itching, nausea, etc)  3. Fever over 101.5 F (38.5 C) 4. Inability to urinate 5. Nausea and/or vomiting 6. Worsening swelling or bruising 7. Continued bleeding from incision. 8. Increased pain,  redness, or drainage from the incision   The clinic staff is available to answer your questions during regular business hours (8:30am-5pm).  Please dont hesitate to call and ask to speak to one of our nurses for clinical concerns.   If you have a medical emergency, go to the nearest emergency room or call 911.  A surgeon from [facility] is always on call at the hospitals in [facility], [place], [place], [place], [place]  [address] ?  [address] [address], [place], [place]   [address] MAIN: [phone] ? TOLL FREE: [phone] ? FAX: [phone] [email or web address]   Hernia, Adult     A hernia is the bulging of an organ or tissue through a weak spot in the muscles of the abdomen (abdominal wall). Hernias develop most often near the belly button (navel) or the area where the leg meets the lower abdomen (groin). Common types of hernias include:  Incisional hernia. This type bulges through a scar from an abdominal surgery.  Umbilical hernia. This type develops near the navel.  Inguinal hernia. This type develops  in the groin or scrotum.  Femoral hernia. This type develops under the groin, in the upper thigh area.  Hiatal hernia. This type occurs when part of the stomach slides above the muscle that separates the abdomen from the chest (diaphragm). What are the causes? This condition may be caused by:  Heavy lifting.  Coughing over a long period of time.  Straining to have a bowel movement. Constipation can lead to straining.  An incision made during an abdominal surgery.  A physical problem that is present at birth (congenital defect).  Being overweight or obese.  Smoking.  Excess fluid in the abdomen.  Undescended testicles in males. What are the signs or symptoms? The main symptom is a skin-colored, rounded bulge in the area of the hernia. However, a bulge may not always be present. It may grow bigger or  be more visible when you cough or strain (such as when lifting something heavy). A hernia that can be pushed back into the area (is reducible) rarely causes pain. A hernia that cannot be pushed back into the area (is incarcerated) may lose its blood supply (become strangulated). A hernia that is incarcerated may cause:  Pain.  Fever.  Nausea and vomiting.  Swelling.  Constipation. How is this diagnosed? A hernia may be diagnosed based on:  Your symptoms and medical history.  A physical exam. Your health care provider may ask you to cough or move in certain ways to see if the hernia becomes visible.  Imaging tests, such as: ? X-rays. ? Ultrasound. ? CT scan. How is this treated? A hernia that is small and painless may not need to be treated. A hernia that is large or painful may be treated with surgery. Inguinal hernias may be treated with surgery to prevent incarceration or strangulation. Strangulated hernias are always treated with surgery because a lack of blood supply to the trapped organ or tissue can cause it to die. Surgery to treat a hernia involves pushing the bulge back into place and repairing the weak area of the muscle or abdominal wall. Follow these instructions at home: Activity  Avoid straining.  Do not lift anything that is heavier than 10 lb (4.5 kg), or the limit that you are told, until your health care provider says that it is safe.  When lifting heavy objects, lift with your leg muscles, not your back muscles. Preventing constipation  Take actions to prevent constipation. Constipation leads to straining with bowel movements, which can make a hernia worse or cause a hernia repair to break down. Your health care provider may recommend that you: ? Drink enough fluid to keep your urine pale yellow. ? Eat foods that are high in fiber, such as fresh fruits and vegetables, whole grains, and beans. ? Limit foods that are high in fat and processed sugars, such as  fried or sweet foods. ? Take an over-the-counter or prescription medicine for constipation. General instructions  When coughing, try to cough gently.  You may try to push the hernia back in place by very gently pressing on it while lying down. Do not try to force the bulge back in if it will not push in easily.  If you are overweight, work with your health care provider to lose weight safely.  Do not use any products that contain nicotine or tobacco, such as cigarettes and e-cigarettes. If you need help quitting, ask your health care provider.  If you are scheduled for hernia repair, watch your hernia for any changes  in shape, size, or color. Tell your health care provider about any changes or new symptoms.  Take over-the-counter and prescription medicines only as told by your health care provider.  Keep all follow-up visits as told by your health care provider. This is important. Contact a health care provider if:  You develop new pain, swelling, or redness around your hernia.  You have signs of constipation, such as: ? Fewer bowel movements in a week than normal. ? Difficulty having a bowel movement. ? Stools that are dry, hard, or larger than normal. Get help right away if:  You have a fever.  You have abdomen pain that gets worse.  You feel nauseous or you vomit.  You cannot push the hernia back in place by very gently pressing on it while lying down. Do not try to force the bulge back in if it will not push in easily.  The hernia: ? Changes in shape, size, or color. ? Feels hard or tender. These symptoms may represent a serious problem that is an emergency. Do not wait to see if the symptoms will go away. Get medical help right away. Call your local emergency services (911 in the [place] Summary  A hernia is the bulging of an organ or tissue through a weak spot in the muscles of the abdomen (abdominal wall).  The main symptom is a skin-colored, rounded lump (bulge) in  the hernia area. However, a bulge may not always be present. It may grow bigger or more visible when you cough or strain (such as when having a bowel movement).  A hernia that is small and painless may not need to be treated. A hernia that is large or painful may be treated with surgery.  Surgery to treat a hernia involves pushing the bulge back into place and repairing the weak part of the abdomen. This information is not intended to replace advice given to you by your health care provider. Make sure you discuss any questions you have with your health care provider. Document Released: 04/23/2005 Document Revised: 08/14/2018 Document Reviewed: 01/23/2017 Elsevier Patient Education  2020 [employer].

## 2019-03-19 NOTE — Interval H&P Note (Signed)
History and Physical Interval Note:  03/19/2019 8:38 AM  William Montgomery  has presented today for surgery, with the diagnosis of INCISIONAL HERNIA, RIGHT INGUINAL HERNIA.  The various methods of treatment have been discussed with the patient and family. After consideration of risks, benefits and other options for treatment, the patient has consented to  Procedure(s): [place] (N/A) LAPAROSCOPIC RIGHT INGUINAL HERNIA REPAIR WITH MESH (Right) as a surgical intervention.  The patient's history has been reviewed, patient examined, no change in status, stable for surgery.  I have reviewed the patient's chart and labs.  Questions were answered to the patient's satisfaction.    I have re-reviewed the the patient's records, history, medications, and allergies.  I have re-examined the patient.  I again discussed intraoperative plans and goals of post-operative recovery.  The patient agrees to proceed.  William Montgomery  1940-08-01 [id number]  Patient Care Team: Tammi Sou, MD as PCP - General Minus Breeding, MD as PCP - Cardiology (Cardiology) Raynelle Bring, MD as Consulting Physician (Urology) Minus Breeding, MD as Consulting Physician (Cardiology) End, Harrell Gave, MD as Consulting Physician (Cardiology) [place], Jennefer Bravo, MD as Referring Physician (Dermatology) Alexis Frock, MD as Consulting Physician (Urology) Michael Boston, MD as Consulting Physician (General Surgery) Danis, Kirke Corin, MD as Consulting Physician (Gastroenterology)  Patient Active Problem List   Diagnosis Date Noted  . Overweight (BMI 25.0-29.9) 11/28/2018  . Accelerating angina ([place]) 03/21/2017  . Elevated blood pressure reading without diagnosis of hypertension 09/23/2015  . Prostate cancer ([place]) 07/14/2015  . Preventative health care 03/19/2014  . Insomnia 03/19/2014  . Benign prostatic hypertrophy 09/15/2012  . Chronic low back pain 10/15/2011  . Gallbladder  polyp 10/15/2011  . Need for shingles vaccine 10/15/2011  . Special screening for malignant neoplasms, colon 07/25/2011  . Personal history of colonic polyps 07/25/2011  . Benign neoplasm of colon 07/25/2011  . Diverticulosis of colon (without mention of hemorrhage) 07/25/2011  . Colon cancer screening 07/12/2011  . Hx of colonic polyp 07/12/2011  . CAD (coronary artery disease) 07/12/2011  . Low back pain 02/13/2011  . Prostate cancer screening 02/13/2011  . HTN (hypertension) 01/03/2011  . Health maintenance examination 10/13/2010  . Transaminasemia 10/13/2010  . Hyperlipidemia 11/06/2008  . Coronary atherosclerosis 11/06/2008  . [place] DISEASE 11/06/2008    Past Medical History:  Diagnosis Date  . Arthritis   . 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 Bloating 2020   suspected to be from chronic probiotic use  . BPH (benign prostatic hyperplasia)   . Chronic renal insufficiency, stage 2 (mild)    borderlines II/III  . Colon polyps 07/2011   hyperplastic 07/25/2011. No further colon ca screening indicated as of 12/2018 GI eval (Dr. Loletha Carrow).  . Coronary artery disease 12/22/2007   Cath 03/2017-->  70 - 80% mid/distal circ stenosis and a small D1 80% stenosis and proximal stents in the prox and mid LAD.   Med mgmt rec'd-->nitrates caused HAs, ACE-I caused cough.  . DDD (degenerative disc disease)    s/p surgery; L/S spine MRI 05/2004 showed DDD/spondylosis with right L3 nerve root abutment, with L5/S1 surgical changes.  . Diverticulosis of sigmoid colon 07/25/11   Severe (endoscopy by Dr. Sharlett Iles)  . Erectile dysfunction   . Gallbladder polyp 2013   Noted 06/2011.  Stable on u/s f/u 10/2011.  . Gallstone 05/2018   Asymptomatic (picked up on CT abd/pelv).  . Hearing loss 2004   Dr. Tamala Julian,  ENT  . History of prostate cancer 12/2014   Robot assisted radical prostatectomy 07/2015: lymph node involvement.  Plan is active surveillance as per urology: As of  09/2018 PSA  undetectable.  Marland Kitchen HTN (hypertension)    ACE-I cough.  Fine on ARB.  Marland Kitchen Hyperlipidemia   . NASH (nonalcoholic steatohepatitis) 06/2011   mild transaminasemia (Hep B and C testing neg)  . Pulmonary nodules    small, picked up on CT ab/pelv for abd pains/flank pains-->pt quit smoking 40 yrs ago.  Low risk for lung ca so no f/u CT needed.  . Tinnitus   . Ureterolithiasis    8 mm left distal ureteral stone 05/2018, got ureteroscopy with holmium laser treatment.    Past Surgical History:  Procedure Laterality Date  . APPENDECTOMY  1953  . BACK SURGERY     C-Spine and L-Spine  . CARDIAC CATHETERIZATION    . CARDIOVASCULAR STRESS TEST  2012   Normal  . COLONOSCOPY  02/26/02; 07/2011   Colonoscopy by Dr. Rowe Pavy 2003 was normal (+hx of polyps (adenomatous?) prior), 07/2011 showed one hyperplastic polyp. NO FURTHER SCREENING COLONOSCOPIES INDICTAED as of 12/2018 GI eval.  . CORONARY STENT PLACEMENT     x 2 (LAD)  . CYSTOSCOPY WITH RETROGRADE PYELOGRAM, URETEROSCOPY AND STENT PLACEMENT Left 05/10/2018   Procedure: CYSTOSCOPY WITH LEFT RETROGRADE PYELOGRAM, LEFT URETEROSCOPY AND LEFT URETERAL STENT PLACEMENT;  Surgeon: Alexis Frock, MD;  Location: [facility];  Service: Urology;  Laterality: Left;  . HOLMIUM LASER APPLICATION Left 01/11/2835   Procedure: HOLMIUM LASER APPLICATION;  Surgeon: Alexis Frock, MD;  Location: [facility];  Service: Urology;  Laterality: Left;  . LEFT HEART CATH AND CORONARY ANGIOGRAPHY N/A 03/21/2017   Stents patent; mod circ dz, nothing for intervention--imdur added.  Procedure: LEFT HEART CATH AND CORONARY ANGIOGRAPHY;  Surgeon: Nelva Bush, MD;  Location: [facility];  Service: Cardiovascular;  Laterality: N/A;  . LOBECTOMY     right upper lobectomy for blastomycosis  . LYMPHADENECTOMY Bilateral 07/14/2015   Procedure: LYMPHADENECTOMY;  Surgeon: Raynelle Bring, MD;  Location: [facility];  Service: Urology;  Laterality: Bilateral;  . PROSTATE BIOPSY  04/27/15   4 of 12 bx  core's + prostate adenocarcinoma (tentative treatment plan is prostatectomy ? + other therapies (as of 05/18/15)  . ROBOT ASSISTED LAPAROSCOPIC RADICAL PROSTATECTOMY N/A 07/14/2015   One positive pelvic LN.  Procedure: XI ROBOTIC ASSISTED LAPAROSCOPIC RADICAL PROSTATECTOMY LEVEL 2;  Surgeon: Raynelle Bring, MD;  Location: [facility];  Service: Urology;  Laterality: N/A;  . UMBILICAL HERNIA REPAIR  07/14/2015   Procedure: HERNIA REPAIR UMBILICAL ADULT;  Surgeon: Raynelle Bring, MD;  Location: [facility];  Service: Urology;;    Social History   Socioeconomic History  . Marital status: Married    Spouse name: Not on file  . Number of children: 2  . Years of education: Not on file  . Highest education level: Not on file  Occupational History  . Occupation: Retired  Scientific laboratory technician  . Financial resource strain: Not on file  . Food insecurity    Worry: Not on file    Inability: Not on file  . Transportation needs    Medical: Not on file    Non-medical: Not on file  Tobacco Use  . Smoking status: Former Smoker    Packs/day: 0.50    Years: 20.00    Pack years: 10.00    Types: Cigarettes    Quit date: 05/07/1976    Years since quitting: 42.8  .  Smokeless tobacco: Former Systems developer    Types: Chew    Quit date: 05/07/1998  . Tobacco comment: Scoal  Substance and Sexual Activity  . Alcohol use: Yes    Comment: 1 beer every 6 months  . Drug use: No  . Sexual activity: Not on file  Lifestyle  . Physical activity    Days per week: Not on file    Minutes per session: Not on file  . Stress: Not on file  Relationships  . Social Herbalist on phone: Not on file    Gets together: Not on file    Attends religious service: Not on file    Active member of club or organization: Not on file    Attends meetings of clubs or organizations: Not on file    Relationship status: Not on file  . Intimate partner violence    Fear of current or ex partner: Not on file    Emotionally abused: Not on file     Physically abused: Not on file    Forced sexual activity: Not on file  Other Topics Concern  . Not on file  Social History Narrative   Married, 2 adult children ([place]).   Lives in [place].     Occ: Retired from [employer] ([place] work) at age 19.   Tob (smoke and chew) x 20 yrs, quit approx 1980s.   Alcohol: very rare.   One cup coffee each morning.   Exercise: walks 3 miles about 4 times per week.   Active lifestyle.    Family History  Problem Relation Age of Onset  . Cervical cancer Mother        deceased  . Cirrhosis Father   . Colon cancer Neg Hx     Medications Prior to Admission  Medication Sig Dispense Refill Last Dose  . aspirin 81 MG tablet Take 81 mg every evening by mouth.    03/16/2019  . celecoxib (CELEBREX) 200 MG capsule TAKE 1 CAPSULE (200 MG) BY MOUTH 2 TIMES A DAY AS NEEDED FOR PAIn (Patient taking differently: Take 200 mg by mouth as needed. ) 60 capsule 3 Past Month at Unknown time  . Cholecalciferol (VITAMIN D3) 5000 units CAPS Take 5,000 Units every evening by mouth.   03/18/2019 at Unknown time  . Coenzyme Q10 (COQ10) 100 MG CAPS Take 100 mg every evening by mouth.   03/18/2019 at Unknown time  . losartan (COZAAR) 25 MG tablet Take 1 tablet (25 mg total) by mouth daily. (Patient taking differently: Take 25 mg by mouth at bedtime. ) 90 tablet 3 03/18/2019 at Unknown time  . Multiple Vitamins-Minerals (ICAPS AREDS 2 PO) Take by mouth.   03/18/2019 at Unknown time  . nitroGLYCERIN (NITROSTAT) 0.4 MG SL tablet PLACE 1 TABLET UNDER THE TONGUE EVERY 5 MINUTES AS NEEDED FOR CHEST PAIN. (Patient taking differently: Place 0.4 mg under the tongue every 5 (five) minutes as needed for chest pain. PLACE 1 TABLET UNDER THE TONGUE EVERY 5 MINUTES AS NEEDED FOR CHEST PAIN.) 25 tablet 2   . rosuvastatin (CRESTOR) 20 MG tablet TAKE 1 TABLET (20 MG TOTAL) BY MOUTH DAILY. MUST KEEP APPOINTMENT 01/23/19 WITH DR [facility] FOR FUTURE REFILLS (Patient taking differently: Take  20 mg by mouth at bedtime. ) 90 tablet 3 03/18/2019 at Unknown time    Current Facility-Administered Medications  Medication Dose Route Frequency Provider Last Rate Last Dose  . bupivacaine liposome (EXPAREL) 1.3 % injection 266 mg  20 mL Infiltration Once Michael Boston, MD      . ceFAZolin (ANCEF) IVPB 2g/100 mL premix  2 g Intravenous On Call to OR Michael Boston, MD      . Chlorhexidine Gluconate Cloth 2 % PADS 6 each  6 each Topical Once Michael Boston, MD       And  . Chlorhexidine Gluconate Cloth 2 % PADS 6 each  6 each Topical Once Michael Boston, MD      . lactated ringers infusion   Intravenous Continuous Janeece Riggers, MD 50 mL/hr at 03/19/19 0736       Allergies  Allergen Reactions  . Ace Inhibitors Cough  . Imdur [Isosorbide Dinitrate] Other (See Comments)    Headache    BP 125/74   Pulse (!) 58   Temp 98.1 F (36.7 C) (Oral)   Resp 18   SpO2 99%   Labs: No results found for this or any previous visit (from the past 48 hour(s)).  Imaging / Studies: No results found.   Adin Hector, M.D., F.A.C.S. Gastrointestinal and Minimally Invasive Surgery [facility] [address], [place], [place] [address] [phone] Main / Paging  03/19/2019 8:38 AM    Adin Hector

## 2019-03-20 ENCOUNTER — Encounter (HOSPITAL_COMMUNITY): Payer: Self-pay | Admitting: Surgery

## 2019-03-20 DIAGNOSIS — D176 Benign lipomatous neoplasm of spermatic cord: Secondary | ICD-10-CM | POA: Diagnosis not present

## 2019-03-20 DIAGNOSIS — I129 Hypertensive chronic kidney disease with stage 1 through stage 4 chronic kidney disease, or unspecified chronic kidney disease: Secondary | ICD-10-CM | POA: Diagnosis not present

## 2019-03-20 DIAGNOSIS — K432 Incisional hernia without obstruction or gangrene: Secondary | ICD-10-CM | POA: Diagnosis not present

## 2019-03-20 DIAGNOSIS — K4120 Bilateral femoral hernia, without obstruction or gangrene, not specified as recurrent: Secondary | ICD-10-CM | POA: Diagnosis not present

## 2019-03-20 DIAGNOSIS — K4020 Bilateral inguinal hernia, without obstruction or gangrene, not specified as recurrent: Secondary | ICD-10-CM | POA: Diagnosis not present

## 2019-03-20 DIAGNOSIS — M1990 Unspecified osteoarthritis, unspecified site: Secondary | ICD-10-CM | POA: Diagnosis not present

## 2019-03-20 DIAGNOSIS — N182 Chronic kidney disease, stage 2 (mild): Secondary | ICD-10-CM | POA: Diagnosis not present

## 2019-03-20 DIAGNOSIS — N400 Enlarged prostate without lower urinary tract symptoms: Secondary | ICD-10-CM | POA: Diagnosis not present

## 2019-03-20 DIAGNOSIS — K429 Umbilical hernia without obstruction or gangrene: Secondary | ICD-10-CM | POA: Diagnosis not present

## 2019-03-20 NOTE — Discharge Summary (Signed)
Physician Discharge Summary    Patient ID: William Montgomery MRN: [id number] DOB/AGE: January 27, 1941  78 y.o.  Patient Care Team: Jeoffrey Massed, MD as PCP - General Rollene Rotunda, MD as PCP - Cardiology (Cardiology) Heloise Purpura, MD as Consulting Physician (Urology) Rollene Rotunda, MD as Consulting Physician (Cardiology) End, Cristal Deer, MD as Consulting Physician (Cardiology) Haverstock, Elvin So, MD as Referring Physician (Dermatology) Sebastian Ache, MD as Consulting Physician (Urology) Karie Soda, MD as Consulting Physician (General Surgery) Myrtie Neither Andreas Blower, MD as Consulting Physician (Gastroenterology)  Admit date: 03/19/2019  Discharge date: 03/20/2019  Hospital Stay = 0 days    Discharge Diagnoses:  Principal Problem:   Incisional hernia s/p lap repair with mesh 03/19/2019 Active Problems:   Recurrent umbilical hernia s/p lap repair with mesh 03/19/2019   Bilateral inguinal hernia s/p lap repair with mesh 03/19/2019   Bilateral femoral hernia s/p lap repair with mesh 03/19/2019   1 Day Post-Op  03/19/2019  POST-OPERATIVE DIAGNOSIS:   BILATERAL FEMORAL HERNIAS, BILATERAL INGUINAL HERNIAS, INCISIONAL HERNIA, AND RECURRENT UMBILICAL HERNIA  SURGERY:  03/19/2019  Procedure(s): LAPAROSCOPIC INCISIONAL HERNIA REPAIR WITH MESH, RECURRENT UMBILICAL HERNIA REPAIR LAPAROSCOPIC BILATERAL FEMORAL AND INGUINAL HERNIA REPAIR WITH MESH, LYSIS OF ADHESIONS  SURGEON:    Surgeon(s): Karie Soda, MD  Consults: None  Hospital Course:   The patient underwent the surgery above.  Postoperatively, the patient gradually mobilized and advanced to a solid diet.  Pain and other symptoms were treated aggressively.    By the time of discharge, the patient was walking well the hallways, eating food, having flatus.  Pain was well-controlled on an oral medications.  Based on meeting discharge criteria and continuing to recover, I felt it was safe for the patient to  be discharged from the hospital to further recover with close followup. Postoperative recommendations were discussed in detail.  They are written as well.  Discharged Condition: good  Discharge Exam: Blood pressure 109/68, pulse (!) 52, temperature 98.3 F (36.8 C), temperature source Oral, resp. rate 14, SpO2 99 %.  General: Pt awake/alert/oriented x4 in No acute distress Eyes: PERRL, normal EOM.  Sclera clear.  No icterus Neuro: CN II-XII intact w/o focal sensory/motor deficits. Lymph: No head/neck/groin lymphadenopathy Psych:  No delerium/psychosis/paranoia HENT: Normocephalic, Mucus membranes moist.  No thrush Neck: Supple, No tracheal deviation Chest: No chest wall pain w good excursion CV:  Pulses intact.  Regular rhythm MS: Normal AROM mjr joints.  No obvious deformity Abdomen: Soft.  Nondistended.  Mildly tender at incisions only.  No evidence of peritonitis.  No incarcerated hernias. GU; mild general and scrotal swelling and ecchymosis.  Not severe.  No evidence of recurrent hernias.  No hematoma or seroma Ext:  SCDs BLE.  No mjr edema.  No cyanosis Skin: No petechiae / purpura   Disposition:   Follow-up Information    Karie Soda, MD. Schedule an appointment as soon as possible for a visit in 3 weeks.   Specialty: General Surgery Why: To follow up after your operation, To follow up after your hospital stay Contact information: [address] [place] [address] [phone]           Discharge disposition: 01-Home or Self Care       Discharge Instructions    Call MD for:   Complete by: As directed    FEVER > 101.5 F  (temperatures < 101.5 F are not significant)   Call MD for:  extreme fatigue   Complete by: As  directed    Call MD for:  persistant dizziness or light-headedness   Complete by: As directed    Call MD for:  persistant nausea and vomiting   Complete by: As directed    Call MD for:  redness, tenderness, or signs of  infection (pain, swelling, redness, odor or green/yellow discharge around incision site)   Complete by: As directed    Call MD for:  severe uncontrolled pain   Complete by: As directed    Diet - low sodium heart healthy   Complete by: As directed    Start with a bland diet such as soups, liquids, starchy foods, low fat foods, etc. the first few days at home. Gradually advance to a solid, low-fat, high fiber diet by the end of the first week at home.   Add a fiber supplement to your diet (Metamucil, etc) If you feel full, bloated, or constipated, stay on a full liquid or pureed/blenderized diet for a few days until you feel better and are no longer constipated.   Discharge instructions   Complete by: As directed    See Discharge Instructions If you are not getting better after two weeks or are noticing you are getting worse, contact our office [phone] for further advice.  We may need to adjust your medications, re-evaluate you in the office, send you to the emergency room, or see what other things we can do to help. The clinic staff is available to answer your questions during regular business hours (8:30am-5pm).  Please don't hesitate to call and ask to speak to one of our nurses for clinical concerns.    A surgeon from [facility] is always on call at the hospitals 24 hours/day If you have a medical emergency, go to the nearest emergency room or call 911.   Discharge wound care:   Complete by: As directed    It is good for closed incisions and even open wounds to be washed every day.  Shower every day.  Short baths are fine.  Wash the incisions and wounds clean with soap & water.    You may leave closed incisions open to air if it is dry.   You may cover the incision with clean gauze & replace it after your daily shower for comfort.  STERISTRIPS:  You have skin tapes called Steristrips on your incisions.  Leave them in place, and they will fall off on their own like a scab.   You may trim any edges that curl up with clean scissors.  You may remove them in the shower in a week.   Driving Restrictions   Complete by: As directed    You may drive when: - you are no longer taking narcotic prescription pain medication - you can comfortably wear a seatbelt - you can safely make sudden turns/stops without pain.   Increase activity slowly   Complete by: As directed    Start light daily activities --- self-care, walking, climbing stairs- beginning the day after surgery.  Gradually increase activities as tolerated.  Control your pain to be active.  Stop when you are tired.  Ideally, walk several times a day, eventually an hour a day.   Most people are back to most day-to-day activities in a few weeks.  It takes 4-6 weeks to get back to unrestricted, intense activity. If you can walk 30 minutes without difficulty, it is safe to try more intense activity such as jogging, treadmill, bicycling, low-impact aerobics, swimming, etc. Save  the most intensive and strenuous activity for last (Usually 4-8 weeks after surgery) such as sit-ups, heavy lifting, contact sports, etc.  Refrain from any intense heavy lifting or straining until you are off narcotics for pain control.  You will have off days, but things should improve week-by-week. DO NOT PUSH THROUGH PAIN.  Let pain be your guide: If it hurts to do something, don't do it.   Lifting restrictions   Complete by: As directed    If you can walk 30 minutes without difficulty, it is safe to try more intense activity such as jogging, treadmill, bicycling, low-impact aerobics, swimming, etc. Save the most intensive and strenuous activity for last (Usually 4-8 weeks after surgery) such as sit-ups, heavy lifting, contact sports, etc.   Refrain from any intense heavy lifting or straining until you are off narcotics for pain control.  You will have off days, but things should improve week-by-week. DO NOT PUSH THROUGH PAIN.  Let pain be your  guide: If it hurts to do something, don't do it.  Pain is your body warning you to avoid that activity for another week until the pain goes down.   May shower / Bathe   Complete by: As directed    May walk up steps   Complete by: As directed    Remove dressing in 72 hours   Complete by: As directed    Make sure all dressings are removed by the third day after surgery.  Leave incisions open to air.  OK to cover incisions with gauze or bandages as desired   Sexual Activity Restrictions   Complete by: As directed    You may have sexual intercourse when it is comfortable. If it hurts to do something, stop.      Allergies as of 03/20/2019      Reactions   Ace Inhibitors Cough   Imdur [isosorbide Dinitrate] Other (See Comments)   Headache      Medication List    TAKE these medications   aspirin 81 MG tablet Take 81 mg every evening by mouth.   celecoxib 200 MG capsule Commonly known as: CELEBREX TAKE 1 CAPSULE (200 MG) BY MOUTH 2 TIMES A DAY AS NEEDED FOR PAIn What changed:   how much to take  how to take this  when to take this  reasons to take this  additional instructions   CoQ10 100 MG Caps Take 100 mg every evening by mouth.   ICAPS AREDS 2 PO Take by mouth.   losartan 25 MG tablet Commonly known as: COZAAR Take 1 tablet (25 mg total) by mouth daily. What changed: when to take this   nitroGLYCERIN 0.4 MG SL tablet Commonly known as: Nitrostat PLACE 1 TABLET UNDER THE TONGUE EVERY 5 MINUTES AS NEEDED FOR CHEST PAIN. What changed:   how much to take  how to take this  when to take this  reasons to take this   oxyCODONE 5 MG immediate release tablet Commonly known as: Oxy IR/ROXICODONE Take 1-2 tablets (5-10 mg total) by mouth every 6 (six) hours as needed for moderate pain, severe pain or breakthrough pain.   rosuvastatin 20 MG tablet Commonly known as: CRESTOR TAKE 1 TABLET (20 MG TOTAL) BY MOUTH DAILY. MUST KEEP APPOINTMENT 01/23/19 WITH DR  [facility] FOR FUTURE REFILLS What changed:   when to take this  additional instructions   Vitamin D3 125 MCG (5000 UT) Caps Take 5,000 Units every evening by mouth.  Discharge Care Instructions  (From admission, onward)         Start     Ordered   03/19/19 0000  Discharge wound care:    Comments: It is good for closed incisions and even open wounds to be washed every day.  Shower every day.  Short baths are fine.  Wash the incisions and wounds clean with soap & water.    You may leave closed incisions open to air if it is dry.   You may cover the incision with clean gauze & replace it after your daily shower for comfort.  STERISTRIPS:  You have skin tapes called Steristrips on your incisions.  Leave them in place, and they will fall off on their own like a scab.  You may trim any edges that curl up with clean scissors.  You may remove them in the shower in a week.   03/19/19 1333          Significant Diagnostic Studies:  No results found for this or any previous visit (from the past 72 hour(s)).  No results found.  Past Medical History:  Diagnosis Date  . Arthritis   . 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 Bloating 2020   suspected to be from chronic probiotic use  . BPH (benign prostatic hyperplasia)   . Chronic renal insufficiency, stage 2 (mild)    borderlines II/III  . Colon polyps 07/2011   hyperplastic 07/25/2011. No further colon ca screening indicated as of 12/2018 GI eval (Dr. Myrtie Neither).  . Coronary artery disease 12/22/2007   Cath 03/2017-->  70 - 80% mid/distal circ stenosis and a small D1 80% stenosis and proximal stents in the prox and mid LAD.   Med mgmt rec'd-->nitrates caused HAs, ACE-I caused cough.  . DDD (degenerative disc disease)    s/p surgery; L/S spine MRI 05/2004 showed DDD/spondylosis with right L3 nerve root abutment, with L5/S1 surgical changes.  . Diverticulosis of sigmoid colon 07/25/11   Severe (endoscopy by Dr. Jarold Motto)  .  Erectile dysfunction   . Gallbladder polyp 2013   Noted 06/2011.  Stable on u/s f/u 10/2011.  . Gallstone 05/2018   Asymptomatic (picked up on CT abd/pelv).  . Hearing loss 2004   Dr. Katrinka Blazing, ENT  . History of prostate cancer 12/2014   Robot assisted radical prostatectomy 07/2015: lymph node involvement.  Plan is active surveillance as per urology: As of  09/2018 PSA undetectable.  Marland Kitchen HTN (hypertension)    ACE-I cough.  Fine on ARB.  Marland Kitchen Hyperlipidemia   . NASH (nonalcoholic steatohepatitis) 06/2011   mild transaminasemia (Hep B and C testing neg)  . Pulmonary nodules    small, picked up on CT ab/pelv for abd pains/flank pains-->pt quit smoking 40 yrs ago.  Low risk for lung ca so no f/u CT needed.  . Tinnitus   . Ureterolithiasis    8 mm left distal ureteral stone 05/2018, got ureteroscopy with holmium laser treatment.    Past Surgical History:  Procedure Laterality Date  . APPENDECTOMY  1953  . BACK SURGERY     C-Spine and L-Spine  . CARDIAC CATHETERIZATION    . CARDIOVASCULAR STRESS TEST  2012   Normal  . COLONOSCOPY  02/26/02; 07/2011   Colonoscopy by Dr. Linna Darner 2003 was normal (+hx of polyps (adenomatous?) prior), 07/2011 showed one hyperplastic polyp. NO FURTHER SCREENING COLONOSCOPIES INDICTAED as of 12/2018 GI eval.  . CORONARY STENT PLACEMENT     x 2 (LAD)  .  CYSTOSCOPY WITH RETROGRADE PYELOGRAM, URETEROSCOPY AND STENT PLACEMENT Left 05/10/2018   Procedure: CYSTOSCOPY WITH LEFT RETROGRADE PYELOGRAM, LEFT URETEROSCOPY AND LEFT URETERAL STENT PLACEMENT;  Surgeon: Sebastian Ache, MD;  Location: [facility];  Service: Urology;  Laterality: Left;  . HOLMIUM LASER APPLICATION Left 05/10/2018   Procedure: HOLMIUM LASER APPLICATION;  Surgeon: Sebastian Ache, MD;  Location: [facility];  Service: Urology;  Laterality: Left;  . LEFT HEART CATH AND CORONARY ANGIOGRAPHY N/A 03/21/2017   Stents patent; mod circ dz, nothing for intervention--imdur added.  Procedure: LEFT HEART CATH AND CORONARY ANGIOGRAPHY;   Surgeon: Yvonne Kendall, MD;  Location: [facility];  Service: Cardiovascular;  Laterality: N/A;  . LOBECTOMY     right upper lobectomy for blastomycosis  . LYMPHADENECTOMY Bilateral 07/14/2015   Procedure: LYMPHADENECTOMY;  Surgeon: Heloise Purpura, MD;  Location: [facility];  Service: Urology;  Laterality: Bilateral;  . PROSTATE BIOPSY  04/27/15   4 of 12 bx core's + prostate adenocarcinoma (tentative treatment plan is prostatectomy ? + other therapies (as of 05/18/15)  . ROBOT ASSISTED LAPAROSCOPIC RADICAL PROSTATECTOMY N/A 07/14/2015   One positive pelvic LN.  Procedure: XI ROBOTIC ASSISTED LAPAROSCOPIC RADICAL PROSTATECTOMY LEVEL 2;  Surgeon: Heloise Purpura, MD;  Location: [facility];  Service: Urology;  Laterality: N/A;  . UMBILICAL HERNIA REPAIR  07/14/2015   Procedure: HERNIA REPAIR UMBILICAL ADULT;  Surgeon: Heloise Purpura, MD;  Location: [facility];  Service: Urology;;    Social History   Socioeconomic History  . Marital status: Married    Spouse name: Not on file  . Number of children: 2  . Years of education: Not on file  . Highest education level: Not on file  Occupational History  . Occupation: Retired  Engineer, production  . Financial resource strain: Not on file  . Food insecurity    Worry: Not on file    Inability: Not on file  . Transportation needs    Medical: Not on file    Non-medical: Not on file  Tobacco Use  . Smoking status: Former Smoker    Packs/day: 0.50    Years: 20.00    Pack years: 10.00    Types: Cigarettes    Quit date: 05/07/1976    Years since quitting: 42.8  . Smokeless tobacco: Former Neurosurgeon    Types: Chew    Quit date: 05/07/1998  . Tobacco comment: Scoal  Substance and Sexual Activity  . Alcohol use: Yes    Comment: 1 beer every 6 months  . Drug use: No  . Sexual activity: Not on file  Lifestyle  . Physical activity    Days per week: Not on file    Minutes per session: Not on file  . Stress: Not on file  Relationships  . Social Musician  on phone: Not on file    Gets together: Not on file    Attends religious service: Not on file    Active member of club or organization: Not on file    Attends meetings of clubs or organizations: Not on file    Relationship status: Not on file  . Intimate partner violence    Fear of current or ex partner: Not on file    Emotionally abused: Not on file    Physically abused: Not on file    Forced sexual activity: Not on file  Other Topics Concern  . Not on file  Social History Narrative   Married, 2 adult children ([place] and [place]).  Lives in [place].     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   Tob (smoke and chew) x 20 yrs, quit approx 1980s.   Alcohol: very rare.   One cup coffee each morning.   Exercise: walks 3 miles about 4 times per week.   Active lifestyle.    Family History  Problem Relation Age of Onset  . Cervical cancer Mother        deceased  . Cirrhosis Father   . Colon cancer Neg Hx     Current Facility-Administered Medications  Medication Dose Route Frequency Provider Last Rate Last Dose  . 0.9 %  sodium chloride infusion   Intravenous Continuous Karie Soda, MD 50 mL/hr at 03/19/19 1555    . 0.9 %  sodium chloride infusion  250 mL Intravenous PRN Karie Soda, MD      . acetaminophen (TYLENOL) tablet 1,000 mg  1,000 mg Oral Trixie Deis, MD   1,000 mg at 03/19/19 2212  . aspirin EC tablet 81 mg  81 mg Oral QPM Karie Soda, MD   81 mg at 03/19/19 1712  . bisacodyl (DULCOLAX) suppository 10 mg  10 mg Rectal Q12H PRN Karie Soda, MD      . celecoxib (CELEBREX) capsule 200 mg  200 mg Oral PRN Karie Soda, MD      . cholecalciferol (VITAMIN D3) tablet 5,000 Units  5,000 Units Oral Lionel December Karie Soda, MD   5,000 Units at 03/19/19 1711  . diphenhydrAMINE (BENADRYL) 12.5 MG/5ML elixir 12.5 mg  12.5 mg Oral Q6H PRN Karie Soda, MD       Or  . diphenhydrAMINE (BENADRYL) injection 12.5 mg  12.5 mg Intravenous Q6H PRN Karie Soda, MD       . enoxaparin (LOVENOX) injection 40 mg  40 mg Subcutaneous Q24H Karie Soda, MD   40 mg at 03/20/19 0726  . gabapentin (NEURONTIN) capsule 300 mg  300 mg Oral BID Karie Soda, MD   300 mg at 03/19/19 2212  . HYDROmorphone (DILAUDID) injection 0.5-2 mg  0.5-2 mg Intravenous Q2H PRN Karie Soda, MD   1 mg at 03/19/19 1931  . lactated ringers infusion 1,000 mL  1,000 mL Intravenous Q8H PRN Karie Soda, MD      . lactated ringers infusion   Intravenous Continuous Karie Soda, MD 50 mL/hr at 03/19/19 0736    . lip balm (CARMEX) ointment 1 application  1 application Topical BID Karie Soda, MD   1 application at 03/19/19 2212  . losartan (COZAAR) tablet 25 mg  25 mg Oral Laurena Slimmer, MD   25 mg at 03/19/19 2212  . magic mouthwash  15 mL Oral QID PRN Karie Soda, MD      . methocarbamol (ROBAXIN) 1,000 mg in dextrose 5 % 100 mL IVPB  1,000 mg Intravenous Q6H PRN Karie Soda, MD 200 mL/hr at 03/19/19 1356 1,000 mg at 03/19/19 1356  . methocarbamol (ROBAXIN) tablet 1,000 mg  1,000 mg Oral Q6H PRN Karie Soda, MD      . metoprolol tartrate (LOPRESSOR) injection 5 mg  5 mg Intravenous Q6H PRN Karie Soda, MD      . nitroGLYCERIN (NITROSTAT) SL tablet 0.4 mg  0.4 mg Sublingual Q5 min PRN Karie Soda, MD      . ondansetron (ZOFRAN-ODT) disintegrating tablet 4 mg  4 mg Oral Q6H PRN Karie Soda, MD       Or  . ondansetron [facility]) injection 4 mg  4 mg  Intravenous Q6H PRN Karie Soda, MD   4 mg at 03/19/19 2018  . oxyCODONE (Oxy IR/ROXICODONE) immediate release tablet 5-10 mg  5-10 mg Oral Q4H PRN Karie Soda, MD   5 mg at 03/19/19 1710  . polyethylene glycol (MIRALAX / GLYCOLAX) packet 17 g  17 g Oral Daily Marybella Ethier, Viviann Spare, MD      . polyethylene glycol (MIRALAX / GLYCOLAX) packet 17 g  17 g Oral Q12H PRN Karie Soda, MD      . prochlorperazine (COMPAZINE) tablet 10 mg  10 mg Oral Q6H PRN Karie Soda, MD       Or  . prochlorperazine (COMPAZINE) injection 5-10 mg  5-10 mg  Intravenous Q6H PRN Karie Soda, MD      . rosuvastatin (CRESTOR) tablet 20 mg  20 mg Oral Laurena Slimmer, MD   20 mg at 03/19/19 2212  . simethicone (MYLICON) chewable tablet 40 mg  40 mg Oral Q6H PRN Karie Soda, MD      . sodium chloride flush (NS) 0.9 % injection 3 mL  3 mL Intravenous Catha Gosselin, MD      . sodium chloride flush (NS) 0.9 % injection 3 mL  3 mL Intravenous PRN Karie Soda, MD         Allergies  Allergen Reactions  . Ace Inhibitors Cough  . Imdur [Isosorbide Dinitrate] Other (See Comments)    Headache    Signed: Lorenso Courier, MD, FACS, MASCRS Gastrointestinal and Minimally Invasive Surgery  [facility] [address] [place], [place] [address] [phone] Main / Paging [phone] Fax     03/20/2019, 7:53 AM

## 2019-03-20 NOTE — Progress Notes (Signed)
I discussed operative findings, updated the patient's status, discussed probable steps to recovery, and gave postoperative recommendations to the patient.  Recommendations were made.  Questions were answered.  He expressed understanding & appreciation.

## 2019-03-20 NOTE — Progress Notes (Signed)
Discharge instructions given to pt and all questions were answered.  

## 2019-04-17 DIAGNOSIS — C61 Malignant neoplasm of prostate: Secondary | ICD-10-CM | POA: Diagnosis not present

## 2019-04-21 MED FILL — LOSARTAN POTASSIUM 25 MG TA: 25 | 90 days supply | Qty: 90 | Fill #1

## 2019-04-22 DIAGNOSIS — N5201 Erectile dysfunction due to arterial insufficiency: Secondary | ICD-10-CM | POA: Diagnosis not present

## 2019-04-22 DIAGNOSIS — C61 Malignant neoplasm of prostate: Secondary | ICD-10-CM | POA: Diagnosis not present

## 2019-05-13 MED FILL — ROSUVASTATIN CALCIUM 20 MG: 20 | 90 days supply | Qty: 90 | Fill #1

## 2019-06-02 ENCOUNTER — Other Ambulatory Visit: Payer: Self-pay

## 2019-06-03 ENCOUNTER — Encounter: Payer: Self-pay | Admitting: Family Medicine

## 2019-06-03 ENCOUNTER — Ambulatory Visit (INDEPENDENT_AMBULATORY_CARE_PROVIDER_SITE_OTHER): Payer: 59 | Admitting: Family Medicine

## 2019-06-03 VITALS — BP 120/72 | HR 52 | Temp 97.6°F | Ht 68.0 in | Wt 167.0 lb

## 2019-06-03 DIAGNOSIS — Z0000 Encounter for general adult medical examination without abnormal findings: Secondary | ICD-10-CM

## 2019-06-03 DIAGNOSIS — Z23 Encounter for immunization: Secondary | ICD-10-CM | POA: Diagnosis not present

## 2019-06-03 DIAGNOSIS — I2510 Atherosclerotic heart disease of native coronary artery without angina pectoris: Secondary | ICD-10-CM | POA: Diagnosis not present

## 2019-06-03 DIAGNOSIS — I10 Essential (primary) hypertension: Secondary | ICD-10-CM

## 2019-06-03 DIAGNOSIS — L820 Inflamed seborrheic keratosis: Secondary | ICD-10-CM | POA: Diagnosis not present

## 2019-06-03 DIAGNOSIS — L814 Other melanin hyperpigmentation: Secondary | ICD-10-CM | POA: Diagnosis not present

## 2019-06-03 DIAGNOSIS — L578 Other skin changes due to chronic exposure to nonionizing radiation: Secondary | ICD-10-CM | POA: Diagnosis not present

## 2019-06-03 DIAGNOSIS — L821 Other seborrheic keratosis: Secondary | ICD-10-CM | POA: Diagnosis not present

## 2019-06-03 DIAGNOSIS — D225 Melanocytic nevi of trunk: Secondary | ICD-10-CM | POA: Diagnosis not present

## 2019-06-03 DIAGNOSIS — E7800 Pure hypercholesterolemia, unspecified: Secondary | ICD-10-CM | POA: Diagnosis not present

## 2019-06-03 DIAGNOSIS — L570 Actinic keratosis: Secondary | ICD-10-CM | POA: Diagnosis not present

## 2019-06-03 NOTE — Progress Notes (Signed)
Office Note 06/03/2019  CC:  Chief Complaint  Patient presents with  . Follow-up    HPI:  William Montgomery is a 79 y.o. White male who is here for annual health maintenance exam.  Got inguinal hernias repaired Nov 2020, still a little sore.  Past Medical History:  Diagnosis Date  . Arthritis   . 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 Bloating 2020   suspected to be from chronic probiotic use  . BPH (benign prostatic hyperplasia)   . Chronic renal insufficiency, stage 2 (mild)    borderlines II/III  . Colon polyps 07/2011   hyperplastic 07/25/2011. No further colon ca screening indicated as of 12/2018 GI eval (Dr. Loletha Carrow).  . Coronary artery disease 12/22/2007   Cath 03/2017-->  70 - 80% mid/distal circ stenosis and a small D1 80% stenosis and proximal stents in the prox and mid LAD.   Med mgmt rec'd-->nitrates caused HAs, ACE-I caused cough.  . DDD (degenerative disc disease)    s/p surgery; L/S spine MRI 05/2004 showed DDD/spondylosis with right L3 nerve root abutment, with L5/S1 surgical changes.  . Diverticulosis of sigmoid colon 07/25/11   Severe (endoscopy by Dr. Sharlett Iles)  . Erectile dysfunction   . Gallbladder polyp 2013   Noted 06/2011.  Stable on u/s f/u 10/2011.  . Gallstone 05/2018   Asymptomatic (picked up on CT abd/pelv).  . Hearing loss 2004   Dr. Tamala Julian, ENT  . History of prostate cancer 12/2014   Robot assisted radical prostatectomy 07/2015: lymph node involvement.  Plan is active surveillance as per urology: As of  09/2018 PSA undetectable.  Marland Kitchen HTN (hypertension)    ACE-I cough.  Fine on ARB.  Marland Kitchen Hyperlipidemia   . NASH (nonalcoholic steatohepatitis) 06/2011   mild transaminasemia (Hep B and C testing neg)  . Pulmonary nodules    small, picked up on CT ab/pelv for abd pains/flank pains-->pt quit smoking 40 yrs ago.  Low risk for lung ca so no f/u CT needed.  . Tinnitus   . Ureterolithiasis    8 mm left distal ureteral stone 05/2018, got ureteroscopy with  holmium laser treatment.    Past Surgical History:  Procedure Laterality Date  . APPENDECTOMY  1953  . BACK SURGERY     C-Spine and L-Spine  . CARDIAC CATHETERIZATION    . CARDIOVASCULAR STRESS TEST  2012   Normal  . COLONOSCOPY  02/26/02; 07/2011   Colonoscopy by Dr. Rowe Pavy 2003 was normal (+hx of polyps (adenomatous?) prior), 07/2011 showed one hyperplastic polyp. NO FURTHER SCREENING COLONOSCOPIES INDICTAED as of 12/2018 GI eval.  . CORONARY STENT PLACEMENT     x 2 (LAD)  . CYSTOSCOPY WITH RETROGRADE PYELOGRAM, URETEROSCOPY AND STENT PLACEMENT Left 05/10/2018   Procedure: CYSTOSCOPY WITH LEFT RETROGRADE PYELOGRAM, LEFT URETEROSCOPY AND LEFT URETERAL STENT PLACEMENT;  Surgeon: Alexis Frock, MD;  [facility]: [facility];  Service: Urology;  Laterality: Left;  . HOLMIUM LASER APPLICATION Left 05/12/1094   Procedure: HOLMIUM LASER APPLICATION;  Surgeon: Alexis Frock, MD;  Location: [facility];  Service: Urology;  Laterality: Left;  . INCISIONAL HERNIA REPAIR N/A 03/19/2019   Procedure: LAPAROSCOPIC INCISIONAL HERNIA REPAIR WITH MESH, RECURRENT UMBILICAL HERNIA REPAIR;  Surgeon: Michael Boston, MD;  [facility];  Service: General;  Laterality: N/A;  . INGUINAL HERNIA REPAIR Right 03/19/2019   Procedure: LAPAROSCOPIC BILATERAL FEMORAL AND INGUINAL HERNIA REPAIR WITH MESH, LYSIS OF ADHESIONS;  Surgeon: Michael Boston, MD;  [facility];  Service: General;  Laterality: Right;  .  LEFT HEART CATH AND CORONARY ANGIOGRAPHY N/A 03/21/2017   Stents patent; mod circ dz, nothing for intervention--imdur added.  Procedure: LEFT HEART CATH AND CORONARY ANGIOGRAPHY;  Surgeon: Nelva Bush, MD;  Location: [facility];  Service: Cardiovascular;  Laterality: N/A;  . LOBECTOMY     right upper lobectomy for blastomycosis  . LYMPHADENECTOMY Bilateral 07/14/2015   Procedure: LYMPHADENECTOMY;  Surgeon: Raynelle Bring, MD;  [facility]: [facility];  Service: Urology;  Laterality: Bilateral;  . PROSTATE BIOPSY   04/27/15   4 of 12 bx core's + prostate adenocarcinoma (tentative treatment plan is prostatectomy ? + other therapies (as of 05/18/15)  . ROBOT ASSISTED LAPAROSCOPIC RADICAL PROSTATECTOMY N/A 07/14/2015   One positive pelvic LN.  Procedure: XI ROBOTIC ASSISTED LAPAROSCOPIC RADICAL PROSTATECTOMY LEVEL 2;  Surgeon: Raynelle Bring, MD;  [facility];  Service: Urology;  Laterality: N/A;  . UMBILICAL HERNIA REPAIR  07/14/2015   Procedure: HERNIA REPAIR UMBILICAL ADULT;  Surgeon: Raynelle Bring, MD;  [facility];  Service: Urology;;    Family History  Problem Relation Age of Onset  . Cervical cancer Mother        deceased  . Cirrhosis Father   . Colon cancer Neg Hx     Social History   Socioeconomic History  . Marital status: Married    Spouse name: Not on file  . Number of children: 2  . Years of education: Not on file  . Highest education level: Not on file  Occupational History  . Occupation: Retired  Tobacco Use  . Smoking status: Former Smoker    Packs/day: 0.50    Years: 20.00    Pack years: 10.00    Types: Cigarettes    Quit date: 05/07/1976    Years since quitting: 43.1  . Smokeless tobacco: Former Systems developer    Types: Chew    Quit date: 05/07/1998  . Tobacco comment: Scoal  Substance and Sexual Activity  . Alcohol use: Yes    Comment: 1 beer every 6 months  . Drug use: No  . Sexual activity: Not on file  Other Topics Concern  . Not on file  Social History Narrative   Married, 2 adult children ([place]).   Lives in [place].     Occ: Retired from [employer] [employer] at age 68.   Tob (smoke and chew) x 20 yrs, quit approx 1980s.   Alcohol: very rare.   One cup coffee each morning.   Exercise: walks 3 miles about 4 times per week.   Active lifestyle.   Social Determinants of Health   Financial Resource Strain:   . Difficulty of Paying Living Expenses: Not on file  Food Insecurity:   . Worried About Charity fundraiser in the Last Year: Not on file  .  Ran Out of Food in the Last Year: Not on file  Transportation Needs:   . Lack of Transportation (Medical): Not on file  . Lack of Transportation (Non-Medical): Not on file  Physical Activity:   . Days of Exercise per Week: Not on file  . Minutes of Exercise per Session: Not on file  Stress:   . Feeling of Stress : Not on file  Social Connections:   . Frequency of Communication with Friends and Family: Not on file  . Frequency of Social Gatherings with Friends and Family: Not on file  . Attends Religious Services: Not on file  . Active Member of Clubs or Organizations: Not on file  .  Attends Archivist Meetings: Not on file  . Marital Status: Not on file  Intimate Partner Violence:   . Fear of Current or Ex-Partner: Not on file  . Emotionally Abused: Not on file  . Physically Abused: Not on file  . Sexually Abused: Not on file    Outpatient Medications Prior to Visit  Medication Sig Dispense Refill  . aspirin 81 MG tablet Take 81 mg every evening by mouth.     . celecoxib (CELEBREX) 200 MG capsule TAKE 1 CAPSULE (200 MG) BY MOUTH 2 TIMES A DAY AS NEEDED FOR PAIn (Patient taking differently: Take 200 mg by mouth as needed. ) 60 capsule 3  . Cholecalciferol (VITAMIN D3) 5000 units CAPS Take 5,000 Units every evening by mouth.    . Coenzyme Q10 (COQ10) 100 MG CAPS Take 100 mg every evening by mouth.    . Multiple Vitamins-Minerals (ICAPS AREDS 2 PO) Take by mouth.    . nitroGLYCERIN (NITROSTAT) 0.4 MG SL tablet PLACE 1 TABLET UNDER THE TONGUE EVERY 5 MINUTES AS NEEDED FOR CHEST PAIN. (Patient taking differently: Place 0.4 mg under the tongue every 5 (five) minutes as needed for chest pain. PLACE 1 TABLET UNDER THE TONGUE EVERY 5 MINUTES AS NEEDED FOR CHEST PAIN.) 25 tablet 2  . rosuvastatin (CRESTOR) 20 MG tablet TAKE 1 TABLET (20 MG TOTAL) BY MOUTH DAILY. MUST KEEP APPOINTMENT 01/23/19 WITH DR [facility] FOR FUTURE REFILLS (Patient taking differently: Take 20 mg by mouth at  bedtime. ) 90 tablet 3  . losartan (COZAAR) 25 MG tablet Take 1 tablet (25 mg total) by mouth daily. (Patient taking differently: Take 25 mg by mouth at bedtime. ) 90 tablet 3  . oxyCODONE (OXY IR/ROXICODONE) 5 MG immediate release tablet Take 1-2 tablets (5-10 mg total) by mouth every 6 (six) hours as needed for moderate pain, severe pain or breakthrough pain. 30 tablet 0   No facility-administered medications prior to visit.    Allergies  Allergen Reactions  . Ace Inhibitors Cough  . Imdur [Isosorbide Dinitrate] Other (See Comments)    Headache    ROS Review of Systems  Constitutional: Negative for appetite change, chills, fatigue and fever.  HENT: Negative for congestion, dental problem, ear pain and sore throat.   Eyes: Negative for discharge, redness and visual disturbance.  Respiratory: Negative for cough, chest tightness, shortness of breath and wheezing.   Cardiovascular: Negative for chest pain, palpitations and leg swelling.  Gastrointestinal: Negative for abdominal pain, blood in stool, diarrhea, nausea and vomiting.  Genitourinary: Negative for difficulty urinating, dysuria, flank pain, frequency, hematuria and urgency.  Musculoskeletal: Negative for arthralgias, back pain, joint swelling, myalgias and neck stiffness.  Skin: Negative for pallor and rash.  Neurological: Negative for dizziness, speech difficulty, weakness and headaches.  Hematological: Negative for adenopathy. Does not bruise/bleed easily.  Psychiatric/Behavioral: Negative for confusion and sleep disturbance. The patient is not nervous/anxious.     PE; Blood pressure 120/72, pulse (!) 52, temperature 97.6 F (36.4 C), temperature source Oral, height 5' 8"  (1.727 m), weight 167 lb (75.8 kg), SpO2 97 %. Body mass index is 25.39 kg/m.  Gen: Alert, well appearing.  Patient is oriented to person, place, time, and situation. AFFECT: pleasant, lucid thought and speech. ENT: Ears: EACs clear, normal epithelium.   TMs with good light reflex and landmarks bilaterally.  Eyes: no injection, icteris, swelling, or exudate.  EOMI, PERRLA. Nose: no drainage or turbinate edema/swelling.  No injection or focal lesion.  Mouth: lips without  lesion/swelling.  Oral mucosa pink and moist.  Dentition intact and without obvious caries or gingival swelling.  Oropharynx without erythema, exudate, or swelling.  Neck: supple/nontender.  No LAD, mass, or TM.  Carotid pulses 2+ bilaterally, without bruits. CV: RRR, no m/r/g.   LUNGS: CTA bilat, nonlabored resps, good aeration in all lung fields. ABD: soft, NT, ND, BS normal.  No hepatospenomegaly or mass.  No bruits. EXT: no clubbing, cyanosis, or edema.  Musculoskeletal: no joint swelling, erythema, warmth, or tenderness.  ROM of all joints intact. Skin - no sores or suspicious lesions or rashes or color changes   Pertinent labs:  Lab Results  Component Value Date   TSH 3.080 03/15/2017   Lab Results  Component Value Date   WBC 4.2 03/16/2019   HGB 14.6 03/16/2019   HCT 44.6 03/16/2019   MCV 103.0 (H) 03/16/2019   PLT 144 (L) 03/16/2019   Lab Results  Component Value Date   CREATININE 0.91 03/16/2019   BUN 13 03/16/2019   NA 139 03/16/2019   K 5.1 03/16/2019   CL 106 03/16/2019   CO2 26 03/16/2019   Lab Results  Component Value Date   ALT 23 11/28/2018   AST 21 11/28/2018   ALKPHOS 92 11/28/2018   BILITOT 0.8 11/28/2018   Lab Results  Component Value Date   CHOL 112 11/28/2018   Lab Results  Component Value Date   HDL 39.80 11/28/2018   Lab Results  Component Value Date   LDLCALC 45 11/28/2018   Lab Results  Component Value Date   TRIG 138.0 11/28/2018   Lab Results  Component Value Date   CHOLHDL 3 11/28/2018   Lab Results  Component Value Date   PSA <0.015 07/20/2016   PSA 5.69 (H) 12/24/2014   PSA 4.03 (H) 09/16/2014    ASSESSMENT AND PLAN:   Health maintenance exam: Reviewed age and gender appropriate health maintenance  issues (prudent diet, regular exercise, health risks of tobacco and excessive alcohol, use of seatbelts, fire alarms in home, use of sunscreen).  Also reviewed age and gender appropriate health screening as well as vaccine recommendations. Vaccines: ALL UTD, including pneumovax, prevnar, Tdap, Flu, and shingrix.  He has had 1st shot for covid 19, gets 2nd shot 06/16/19. Labs: CBC, CMET, FLP. Prostate ca screening:  Hx of prostate ca, is s/p prostatectomy and gets followed regularly by urology. Colon ca screening: no further screening colonoscopies are indicated per his GI MD. Pt has taken it upon himself to proceed with GI eval at [facility] in [place]->pt really wants a colonoscopy.  An After Visit Summary was printed and given to the patient.  FOLLOW UP:  Return in about 6 months (around 12/01/2019) for routine chronic illness f/u.  Signed:  Crissie Sickles, MD           06/03/2019

## 2019-06-03 NOTE — Patient Instructions (Signed)

## 2019-06-04 LAB — CBC WITH DIFFERENTIAL/PLATELET
Absolute Monocytes: 230 cells/uL (ref 200–950)
Basophils Absolute: 0 cells/uL (ref 0–200)
Basophils Relative: 0 %
Eosinophils Absolute: 221 cells/uL (ref 15–500)
Eosinophils Relative: 4.7 %
HCT: 43.6 % (ref 38.5–50.0)
Hemoglobin: 14.3 g/dL (ref 13.2–17.1)
Lymphs Abs: 1340 cells/uL (ref 850–3900)
MCH: 32.3 pg (ref 27.0–33.0)
MCHC: 32.8 g/dL (ref 32.0–36.0)
MCV: 98.4 fL (ref 80.0–100.0)
MPV: 11.5 fL (ref 7.5–12.5)
Monocytes Relative: 4.9 %
Neutro Abs: 2909 cells/uL (ref 1500–7800)
Neutrophils Relative %: 61.9 %
Platelets: 143 10*3/uL (ref 140–400)
RBC: 4.43 10*6/uL (ref 4.20–5.80)
RDW: 13.1 % (ref 11.0–15.0)
Total Lymphocyte: 28.5 %
WBC: 4.7 10*3/uL (ref 3.8–10.8)

## 2019-06-04 LAB — COMPREHENSIVE METABOLIC PANEL
AG Ratio: 2 (calc) (ref 1.0–2.5)
ALT: 18 U/L (ref 9–46)
AST: 19 U/L (ref 10–35)
Albumin: 4.4 g/dL (ref 3.6–5.1)
Alkaline phosphatase (APISO): 86 U/L (ref 35–144)
BUN: 8 mg/dL (ref 7–25)
CO2: 26 mmol/L (ref 20–32)
Calcium: 9.3 mg/dL (ref 8.6–10.3)
Chloride: 104 mmol/L (ref 98–110)
Creat: 0.98 mg/dL (ref 0.70–1.18)
Globulin: 2.2 g/dL (calc) (ref 1.9–3.7)
Glucose, Bld: 90 mg/dL (ref 65–99)
Potassium: 3.9 mmol/L (ref 3.5–5.3)
Sodium: 141 mmol/L (ref 135–146)
Total Bilirubin: 0.5 mg/dL (ref 0.2–1.2)
Total Protein: 6.6 g/dL (ref 6.1–8.1)

## 2019-06-04 LAB — LIPID PANEL
Cholesterol: 90 mg/dL (ref <200)
HDL: 39 mg/dL — ABNORMAL LOW (ref >=40)
LDL Cholesterol (Calc): 26 mg/dL (calc)
Non-HDL Cholesterol (Calc): 51 mg/dL (calc) (ref <130)
Total CHOL/HDL Ratio: 2.3 (calc) (ref <5.0)
Triglycerides: 168 mg/dL — ABNORMAL HIGH (ref <150)

## 2019-07-20 ENCOUNTER — Encounter (INDEPENDENT_AMBULATORY_CARE_PROVIDER_SITE_OTHER): Payer: 59 | Admitting: Ophthalmology

## 2019-07-20 DIAGNOSIS — H43813 Vitreous degeneration, bilateral: Secondary | ICD-10-CM | POA: Diagnosis not present

## 2019-07-20 DIAGNOSIS — H2513 Age-related nuclear cataract, bilateral: Secondary | ICD-10-CM

## 2019-07-20 DIAGNOSIS — H353112 Nonexudative age-related macular degeneration, right eye, intermediate dry stage: Secondary | ICD-10-CM

## 2019-07-20 DIAGNOSIS — H353121 Nonexudative age-related macular degeneration, left eye, early dry stage: Secondary | ICD-10-CM

## 2019-07-21 MED FILL — LOSARTAN POTASSIUM 25 MG TA: 25 | 90 days supply | Qty: 90 | Fill #2

## 2019-08-14 MED FILL — ROSUVASTATIN CALCIUM 20 MG: 20 | 90 days supply | Qty: 90 | Fill #2

## 2019-10-23 DIAGNOSIS — C61 Malignant neoplasm of prostate: Secondary | ICD-10-CM | POA: Diagnosis not present

## 2019-10-30 DIAGNOSIS — C61 Malignant neoplasm of prostate: Secondary | ICD-10-CM | POA: Diagnosis not present

## 2019-10-30 DIAGNOSIS — Z87442 Personal history of urinary calculi: Secondary | ICD-10-CM | POA: Diagnosis not present

## 2019-11-06 MED FILL — ROSUVASTATIN CALCIUM 20 MG: 20 | 90 days supply | Qty: 90 | Fill #3

## 2019-11-13 DIAGNOSIS — H524 Presbyopia: Secondary | ICD-10-CM | POA: Diagnosis not present

## 2019-11-13 DIAGNOSIS — H40013 Open angle with borderline findings, low risk, bilateral: Secondary | ICD-10-CM | POA: Diagnosis not present

## 2019-11-13 DIAGNOSIS — G43819 Other migraine, intractable, without status migrainosus: Secondary | ICD-10-CM | POA: Diagnosis not present

## 2019-11-13 DIAGNOSIS — H5203 Hypermetropia, bilateral: Secondary | ICD-10-CM | POA: Diagnosis not present

## 2019-11-13 DIAGNOSIS — H52223 Regular astigmatism, bilateral: Secondary | ICD-10-CM | POA: Diagnosis not present

## 2019-11-13 DIAGNOSIS — H353131 Nonexudative age-related macular degeneration, bilateral, early dry stage: Secondary | ICD-10-CM | POA: Diagnosis not present

## 2019-11-13 DIAGNOSIS — H2513 Age-related nuclear cataract, bilateral: Secondary | ICD-10-CM | POA: Diagnosis not present

## 2019-11-18 ENCOUNTER — Ambulatory Visit: Payer: 59 | Admitting: Family Medicine

## 2019-11-20 ENCOUNTER — Ambulatory Visit (INDEPENDENT_AMBULATORY_CARE_PROVIDER_SITE_OTHER): Payer: 59 | Admitting: Family Medicine

## 2019-11-20 ENCOUNTER — Encounter: Payer: Self-pay | Admitting: Family Medicine

## 2019-11-20 ENCOUNTER — Other Ambulatory Visit: Payer: Self-pay

## 2019-11-20 VITALS — BP 127/74 | HR 49 | Temp 98.2°F | Resp 16 | Ht 68.0 in | Wt 161.0 lb

## 2019-11-20 DIAGNOSIS — I2510 Atherosclerotic heart disease of native coronary artery without angina pectoris: Secondary | ICD-10-CM | POA: Diagnosis not present

## 2019-11-20 DIAGNOSIS — I10 Essential (primary) hypertension: Secondary | ICD-10-CM

## 2019-11-20 DIAGNOSIS — E7800 Pure hypercholesterolemia, unspecified: Secondary | ICD-10-CM | POA: Diagnosis not present

## 2019-11-20 DIAGNOSIS — N182 Chronic kidney disease, stage 2 (mild): Secondary | ICD-10-CM

## 2019-11-20 NOTE — Progress Notes (Signed)
OFFICE VISIT  11/20/2019   CC: No chief complaint on file. F/u chronic illnesses  HPI:    Patient is a 79 y.o. Caucasian male who presents for 6 mo f/u HTN, HLD (with hepatic steatosis), CAD, and CRI II/III.  Feeling good. Eating quite healthy, walks most days and does some toning exercises lately as well.  He checks bp at home and always <130/80. Colonoscopy since last visit??->no.  He saw a GI MD who told him he felt like risk of perf was too high and wouldn't do the procedure.  CRI II/III: takes celebrex rarely (1-2 per month usually).  Pays good attention to hydration.  CAD: no CP, SOB, DOE, palpitations, jaw pain or arm pain. ROS: no fevers, no wheezing, no cough, no dizziness, no HAs, no rashes, no melena/hematochezia.  No polyuria or polydipsia.  No myalgias or arthralgias.  No focal weakness, paresthesias, or tremors.  No acute vision or hearing abnormalities. No n/v/d or abd pain.  No palpitations.     Past Medical History:  Diagnosis Date  . Arthritis   . 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 Bloating 2020   suspected to be from chronic probiotic use  . BPH (benign prostatic hyperplasia)   . Chronic renal insufficiency, stage 2 (mild)    borderlines II/III  . Colon polyps 07/2011   hyperplastic 07/25/2011. No further colon ca screening indicated as of 12/2018 GI eval (Dr. Loletha Carrow).  . Coronary artery disease 12/22/2007   Cath 03/2017-->  70 - 80% mid/distal circ stenosis and a small D1 80% stenosis and proximal stents in the prox and mid LAD.   Med mgmt rec'd-->nitrates caused HAs, ACE-I caused cough.  . DDD (degenerative disc disease)    s/p surgery; L/S spine MRI 05/2004 showed DDD/spondylosis with right L3 nerve root abutment, with L5/S1 surgical changes.  . Diverticulosis of sigmoid colon 07/25/11   Severe (endoscopy by Dr. Sharlett Iles)  . Erectile dysfunction   . Gallbladder polyp 2013   Noted 06/2011.  Stable on u/s f/u 10/2011.  . Gallstone 05/2018    Asymptomatic (picked up on CT abd/pelv).  . Hearing loss 2004   Dr. Tamala Julian, ENT  . History of prostate cancer 12/2014   Robot assisted radical prostatectomy 07/2015: lymph node involvement.  Plan is active surveillance as per urology: As of  09/2018 PSA undetectable.  Marland Kitchen HTN (hypertension)    ACE-I cough.  Fine on ARB.  Marland Kitchen Hyperlipidemia   . NASH (nonalcoholic steatohepatitis) 06/2011   mild transaminasemia (Hep B and C testing neg)  . Pulmonary nodules    small, picked up on CT ab/pelv for abd pains/flank pains-->pt quit smoking 40 yrs ago.  Low risk for lung ca so no f/u CT needed.  . Tinnitus   . Ureterolithiasis    8 mm left distal ureteral stone 05/2018, got ureteroscopy with holmium laser treatment.    Past Surgical History:  Procedure Laterality Date  . APPENDECTOMY  1953  . BACK SURGERY     C-Spine and L-Spine  . CARDIAC CATHETERIZATION    . CARDIOVASCULAR STRESS TEST  2012   Normal  . COLONOSCOPY  02/26/02; 07/2011   Colonoscopy by Dr. Rowe Pavy 2003 was normal (+hx of polyps (adenomatous?) prior), 07/2011 showed one hyperplastic polyp. NO FURTHER SCREENING COLONOSCOPIES INDICTAED as of 12/2018 GI eval.  . CORONARY STENT PLACEMENT     x 2 (LAD)  . CYSTOSCOPY WITH RETROGRADE PYELOGRAM, URETEROSCOPY AND STENT PLACEMENT Left 05/10/2018   Procedure: CYSTOSCOPY WITH  LEFT RETROGRADE PYELOGRAM, LEFT URETEROSCOPY AND LEFT URETERAL STENT PLACEMENT;  Surgeon: Alexis Frock, MD;  [facility];  Service: Urology;  Laterality: Left;  . HOLMIUM LASER APPLICATION Left 08/07/8766   Procedure: HOLMIUM LASER APPLICATION;  Surgeon: Alexis Frock, MD;  [facility];  Service: Urology;  Laterality: Left;  . INCISIONAL HERNIA REPAIR N/A 03/19/2019   Procedure: LAPAROSCOPIC INCISIONAL HERNIA REPAIR WITH MESH, RECURRENT UMBILICAL HERNIA REPAIR;  Surgeon: Michael Boston, MD;  [facility];  Service: General;  Laterality: N/A;  . INGUINAL HERNIA REPAIR Right 03/19/2019   Procedure: LAPAROSCOPIC  BILATERAL FEMORAL AND INGUINAL HERNIA REPAIR WITH MESH, LYSIS OF ADHESIONS;  Surgeon: Michael Boston, MD;  [facility];  Service: General;  Laterality: Right;  . LEFT HEART CATH AND CORONARY ANGIOGRAPHY N/A 03/21/2017   Stents patent; mod circ dz, nothing for intervention--imdur added.  Procedure: LEFT HEART CATH AND CORONARY ANGIOGRAPHY;  Surgeon: Nelva Bush, MD;  Location: [facility];  Service: Cardiovascular;  Laterality: N/A;  . LOBECTOMY     right upper lobectomy for blastomycosis  . LYMPHADENECTOMY Bilateral 07/14/2015   Procedure: LYMPHADENECTOMY;  Surgeon: Raynelle Bring, MD;  [facility];  Service: Urology;  Laterality: Bilateral;  . PROSTATE BIOPSY  04/27/15   4 of 12 bx core's + prostate adenocarcinoma (tentative treatment plan is prostatectomy ? + other therapies (as of 05/18/15)  . ROBOT ASSISTED LAPAROSCOPIC RADICAL PROSTATECTOMY N/A 07/14/2015   One positive pelvic LN.  Procedure: XI ROBOTIC ASSISTED LAPAROSCOPIC RADICAL PROSTATECTOMY LEVEL 2;  Surgeon: Raynelle Bring, MD;  [facility];  Service: Urology;  Laterality: N/A;  . UMBILICAL HERNIA REPAIR  07/14/2015   Procedure: HERNIA REPAIR UMBILICAL ADULT;  Surgeon: Raynelle Bring, MD;  [facility];  Service: Urology;;    Outpatient Medications Prior to Visit  Medication Sig Dispense Refill  . aspirin 81 MG tablet Take 81 mg every evening by mouth.     . celecoxib (CELEBREX) 200 MG capsule TAKE 1 CAPSULE (200 MG) BY MOUTH 2 TIMES A DAY AS NEEDED FOR PAIn (Patient taking differently: Take 200 mg by mouth as needed. ) 60 capsule 3  . Cholecalciferol (VITAMIN D3) 5000 units CAPS Take 5,000 Units every evening by mouth.    . Coenzyme Q10 (COQ10) 100 MG CAPS Take 100 mg every evening by mouth.    . losartan (COZAAR) 25 MG tablet Take 1 tablet (25 mg total) by mouth daily. (Patient taking differently: Take 25 mg by mouth at bedtime. ) 90 tablet 3  . Multiple Vitamins-Minerals (ICAPS AREDS 2 PO) Take by mouth.    .  rosuvastatin (CRESTOR) 20 MG tablet TAKE 1 TABLET (20 MG TOTAL) BY MOUTH DAILY. MUST KEEP APPOINTMENT 01/23/19 WITH DR [facility] FOR FUTURE REFILLS (Patient taking differently: Take 20 mg by mouth at bedtime. ) 90 tablet 3  . nitroGLYCERIN (NITROSTAT) 0.4 MG SL tablet PLACE 1 TABLET UNDER THE TONGUE EVERY 5 MINUTES AS NEEDED FOR CHEST PAIN. (Patient not taking: Reported on 11/20/2019) 25 tablet 2   No facility-administered medications prior to visit.    Allergies  Allergen Reactions  . Ace Inhibitors Cough  . Imdur [Isosorbide Dinitrate] Other (See Comments)    Headache    ROS As per HPI  PE: Vitals with BMI 11/20/2019 06/03/2019 03/20/2019  Height 5' 8"  5' 8"  -  Weight 161 lbs 167 lbs -  BMI 11.57 26.2 -  Systolic 035 597 416  Diastolic 74 72 68  Pulse 49 52 52  O2 sat  on RA today is 99%  Gen: Alert, well appearing.  Patient is oriented to person, place, time, and situation. AFFECT: pleasant, lucid thought and speech. CV: RRR, soft systolic murmur in R and L USB region, w/out diastolic murmur.  No r/g.   LUNGS: CTA bilat, nonlabored resps, good aeration in all lung fields. EXT: no clubbing or cyanosis.  no edema.    LABS:  Lab Results  Component Value Date   TSH 3.080 03/15/2017   Lab Results  Component Value Date   WBC 4.7 06/03/2019   HGB 14.3 06/03/2019   HCT 43.6 06/03/2019   MCV 98.4 06/03/2019   PLT 143 06/03/2019   Lab Results  Component Value Date   CREATININE 0.98 06/03/2019   BUN 8 06/03/2019   NA 141 06/03/2019   K 3.9 06/03/2019   CL 104 06/03/2019   CO2 26 06/03/2019   Lab Results  Component Value Date   ALT 18 06/03/2019   AST 19 06/03/2019   ALKPHOS 92 11/28/2018   BILITOT 0.5 06/03/2019   Lab Results  Component Value Date   CHOL 90 06/03/2019   Lab Results  Component Value Date   HDL 39 (L) 06/03/2019   Lab Results  Component Value Date   LDLCALC 26 06/03/2019   Lab Results  Component Value Date   TRIG 168 (H) 06/03/2019   Lab  Results  Component Value Date   CHOLHDL 2.3 06/03/2019   Lab Results  Component Value Date   PSA <0.015 07/20/2016   PSA 5.69 (H) 12/24/2014   PSA 4.03 (H) 09/16/2014  Gets PSAs followed through Dr. Borden q39mo  IMPRESSION AND PLAN:  1) HTN: The current medical regimen is effective;  continue present plan and medications. BMET today.  2) HLD: tolerating statin. LDL was 26 and hepatic panel normal 6 mo ago. Plan rpt labs 6 mo.  3) CAD, hx of stent.  No BB b/c resting bradycardia. ASA and statin.  Continue routine cardiology f/u.  4) CRI II/III: avoiding NSAIDs almost completely.  Good hydration habits. Lytes/cr today.  5) Prostate ca: he is a little over 4 yrs out from surgery and PSAs have been undetectable on q630morology f/u.  An After Visit Summary was printed and given to the patient.  FOLLOW UP: Return in about 6 months (around 05/22/2020) for annual CPE (fasting).  Signed:  PhCrissie SicklesMD           11/20/2019

## 2019-11-21 LAB — BASIC METABOLIC PANEL
BUN: 19 mg/dL (ref 7–25)
CO2: 24 mmol/L (ref 20–32)
Calcium: 9.7 mg/dL (ref 8.6–10.3)
Chloride: 102 mmol/L (ref 98–110)
Creat: 0.96 mg/dL (ref 0.70–1.18)
Glucose, Bld: 83 mg/dL (ref 65–99)
Potassium: 4.7 mmol/L (ref 3.5–5.3)
Sodium: 138 mmol/L (ref 135–146)

## 2019-12-03 ENCOUNTER — Ambulatory Visit: Payer: 59 | Admitting: Family Medicine

## 2020-01-28 IMAGING — CT CT ABD-PELV W/ CM
2 of 5 series · 15 of 46 positions shown, 17 images · IV contrast (APPLIED)
Comparison: Ultrasound 10/15/2011

CLINICAL DATA: Left flank pain, left lower quadrant pain

EXAM:
CT ABDOMEN AND PELVIS WITH CONTRAST
TECHNIQUE: Multidetector CT imaging of the abdomen and pelvis was performed
using the standard protocol following bolus administration of
intravenous contrast.
CONTRAST:  80mL [id number] IOPAMIDOL ([id number]) INJECTION 61%

[Series 2: axial st · axial · 0.84mm/px · z∈[-518,-28]mm · 12 of 110 slices shown, 14 images]
[im 6/110  soft-tissue]
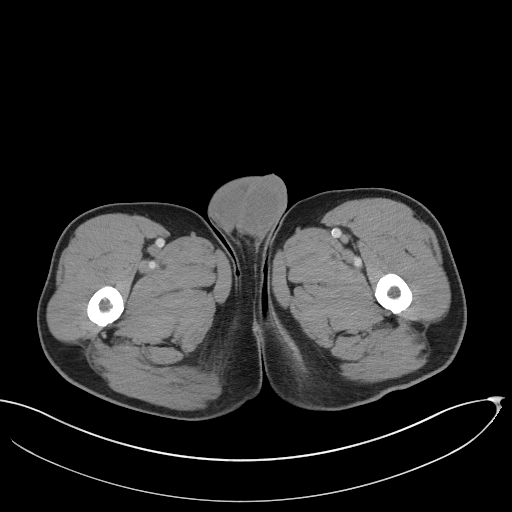
[im 6/110  bone]
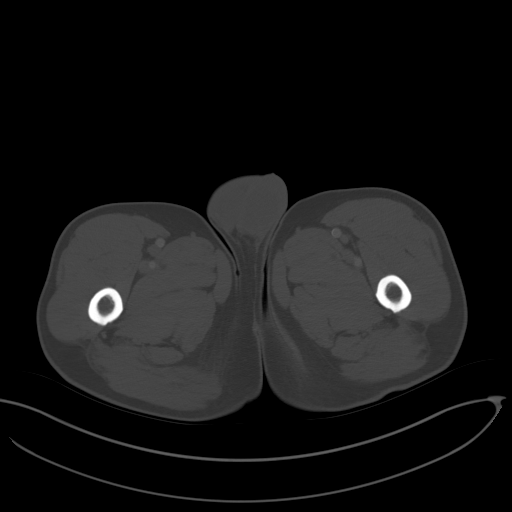
[im 18/110  soft-tissue]
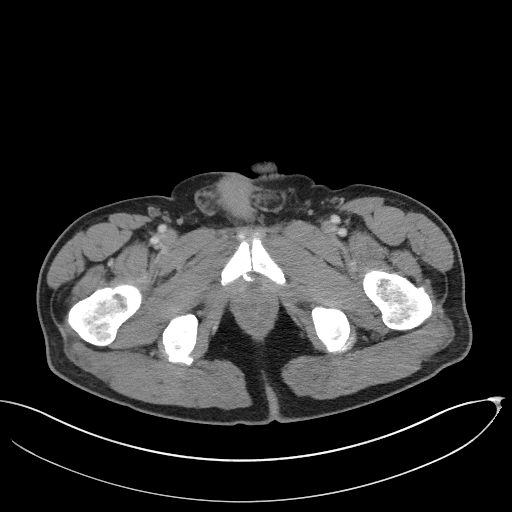
[im 23/110  soft-tissue]
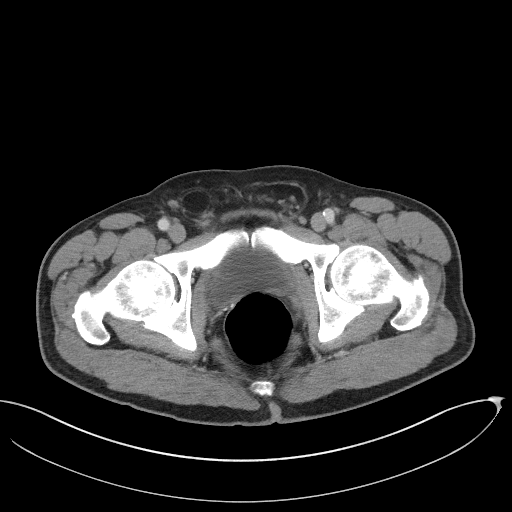
[im 35/110  soft-tissue]
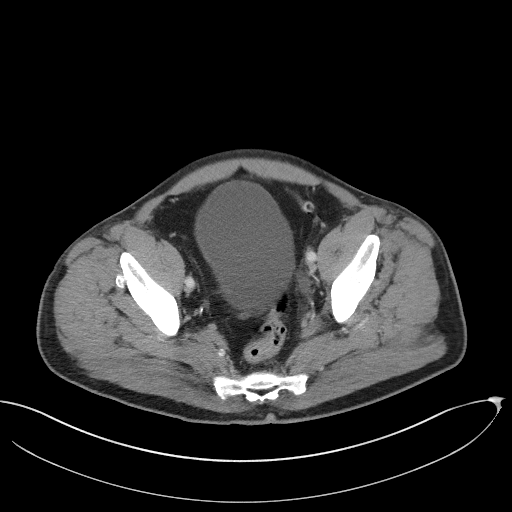
[im 41/110  soft-tissue]
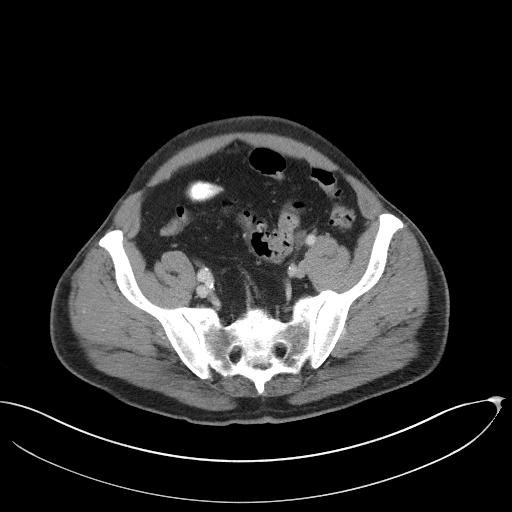
[im 52/110  soft-tissue]
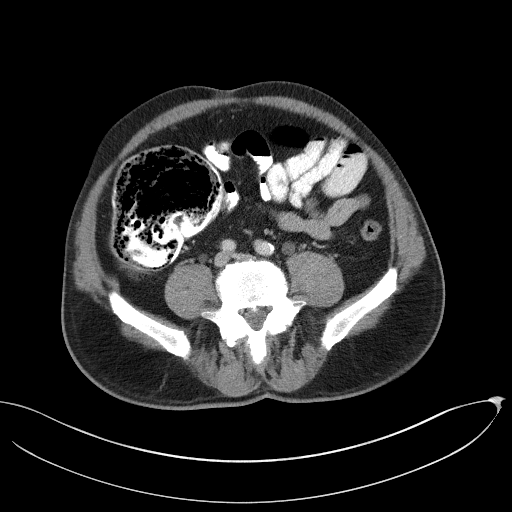
[im 58/110  soft-tissue]
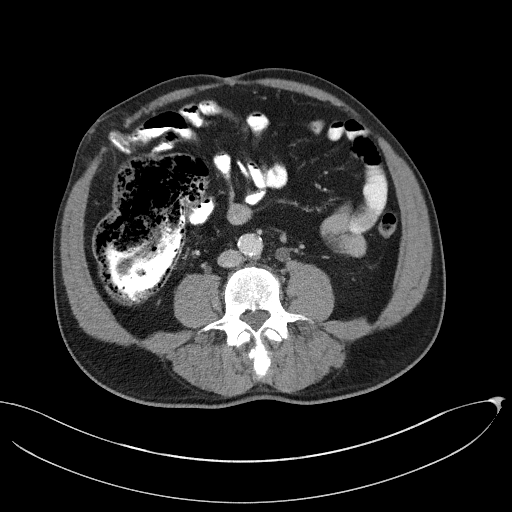
[im 69/110  soft-tissue]
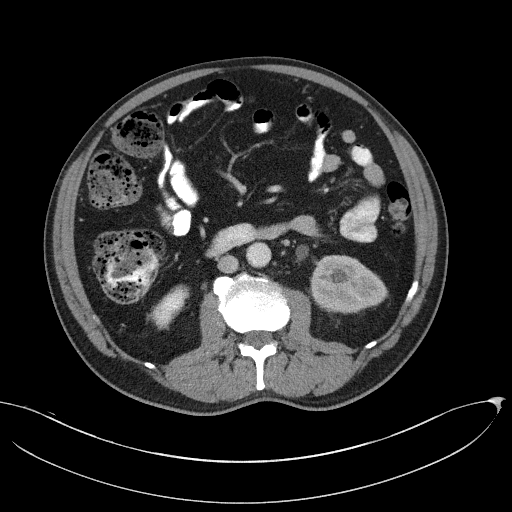
[im 75/110  soft-tissue]
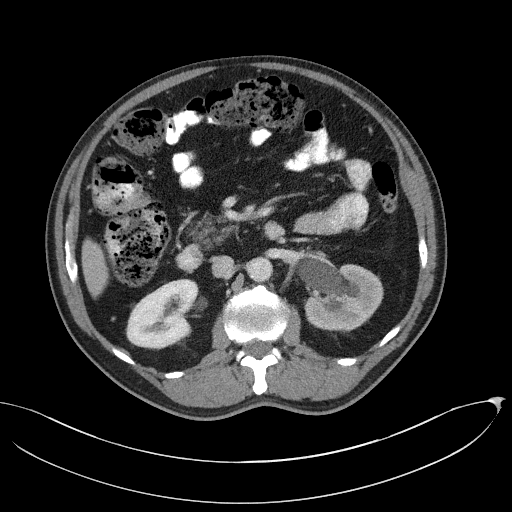
[im 75/110  bone]
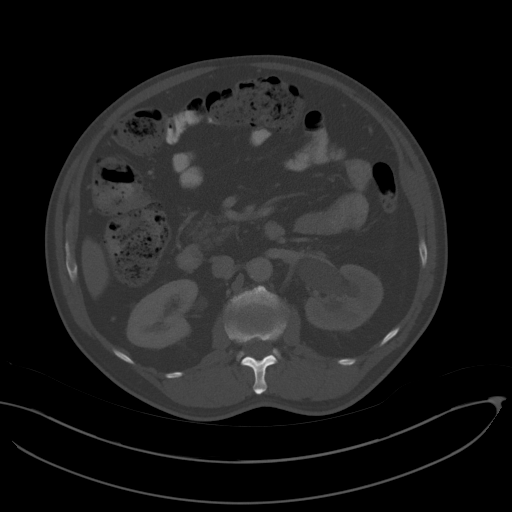
[im 87/110  soft-tissue]
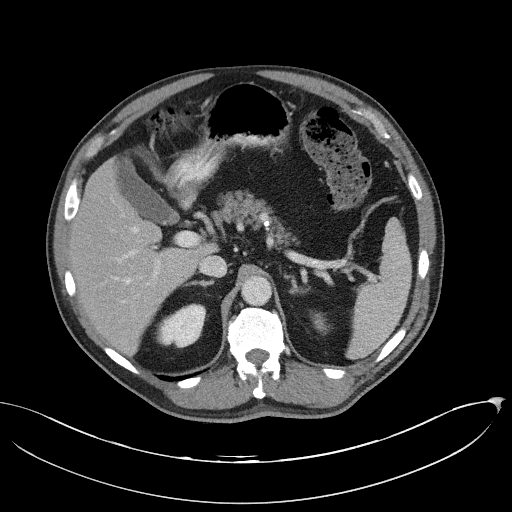
[im 92/110  soft-tissue]
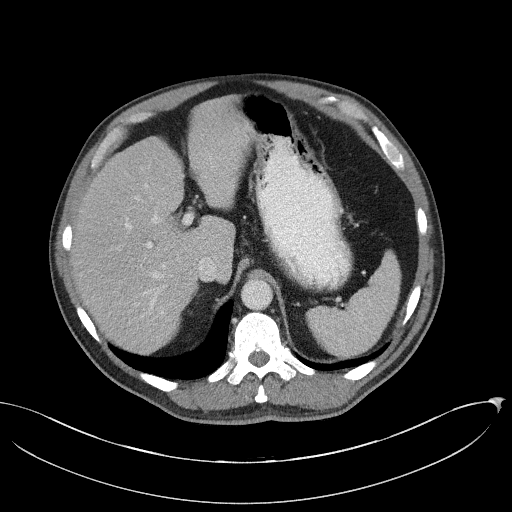
[im 104/110  soft-tissue]
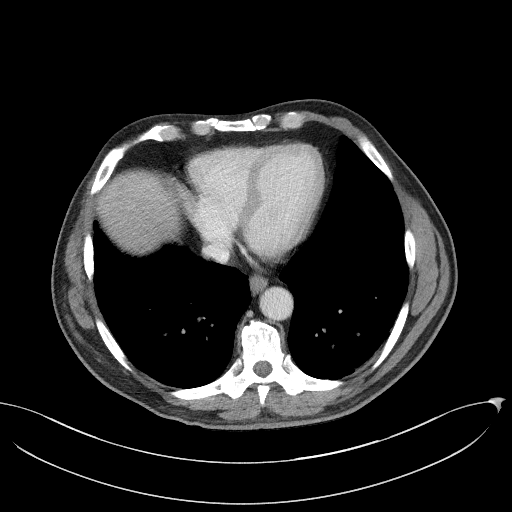

[Series 5: coronal st · coronal · 0.82mm/px · 3 of 108 slices shown]
[im 36/108  soft-tissue]
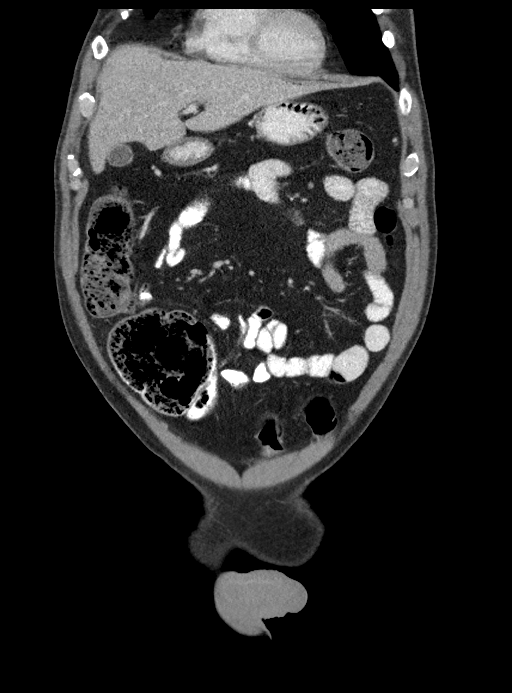
[im 48/108  soft-tissue]
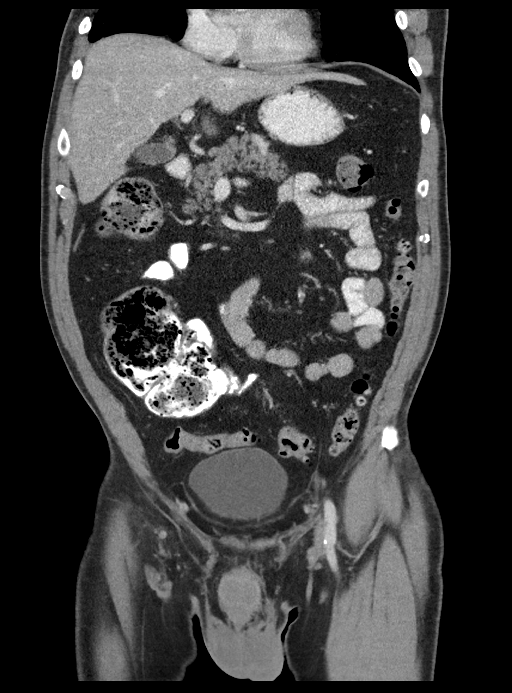
[im 60/108  soft-tissue]
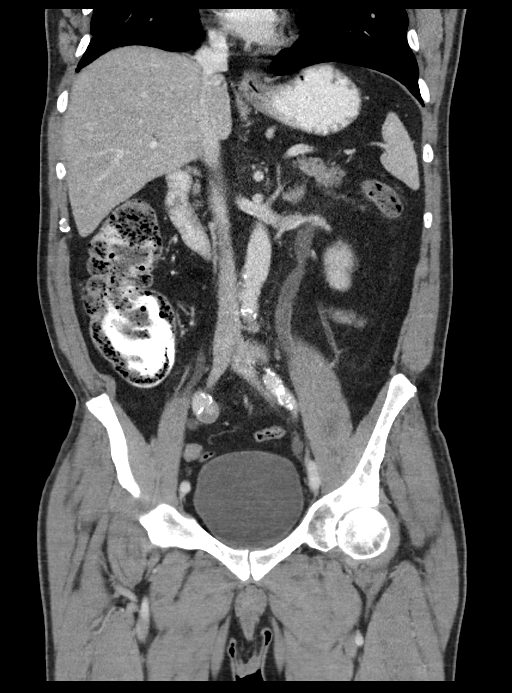

[15 of 46 positions shown; findings below may reference images not displayed]

FINDINGS: Lower chest: 5 mm left lower lobe pulmonary nodule on image 4. 5 mm
lingular nodule on the same image. No effusions. Heart is normal
size.

Hepatobiliary: Small layering gallstone within the gallbladder. No
visible wall thickening. Suspect mild fatty infiltration of the
liver. No biliary ductal dilatation.

Pancreas: No focal abnormality or ductal dilatation.

Spleen: No focal abnormality.  Normal size.

Adrenals/Urinary Tract: Adrenal glands unremarkable. Moderate left
hydronephrosis and hydroureter due to 8 mm distal left ureteral
stone. Delayed excretion of contrast from the left kidney. No stones
or hydronephrosis on the right. Urinary bladder unremarkable.

Stomach/Bowel: Descending colonic and sigmoid diverticulosis. No
active diverticulitis. Large stool burden in the colon. Stomach and
small bowel decompressed, unremarkable.

Vascular/Lymphatic: Aortic atherosclerosis. No enlarged abdominal or
pelvic lymph nodes.

Reproductive: No visible focal abnormality.

Other: No free fluid or free air.

Musculoskeletal: No acute bony abnormality.
IMPRESSION: 8 mm distal left ureteral stone with moderate left hydronephrosis
and hydroureter.

Mild fatty infiltration of the liver.

Small layering gallstone.

Large stool burden in the colon.

Two 5 mm nodules in the visualized left lower lung. No follow-up
needed if patient is low-risk (and has no known or suspected primary
neoplasm). Non-contrast chest CT can be considered in 12 months if
patient is high-risk. This recommendation follows the consensus
statement: Guidelines for Management of Incidental Pulmonary Nodules
Detected on CT Images: From the [HOSPITAL] 4600; Radiology

## 2020-02-05 ENCOUNTER — Ambulatory Visit: Payer: 59 | Admitting: Cardiology

## 2020-02-07 DIAGNOSIS — Z23 Encounter for immunization: Secondary | ICD-10-CM | POA: Diagnosis not present

## 2020-02-15 DIAGNOSIS — H40013 Open angle with borderline findings, low risk, bilateral: Secondary | ICD-10-CM | POA: Diagnosis not present

## 2020-03-07 ENCOUNTER — Ambulatory Visit: Payer: 59 | Admitting: Cardiology

## 2020-03-10 NOTE — Progress Notes (Signed)
Cardiology Office Note   Date:  03/11/2020   ID:  [name], [name] 1941-02-26, MRN [id number]  PCP:  Tammi Sou, MD  Cardiologist:   Minus Breeding, MD   Chief Complaint  Patient presents with  . Coronary Artery Disease      History of Present Illness: [facility] is a 79 y.o. male who presents for follow up of CAD.  At a previous visit he had decreased exercise tolerance.  I sent him for cath and he was found to have 14 - 80% mid/distal circ stenosis and a small D1 80% stenosis and proximal stents in the prox and mid LAD.  He was managed medically.  Following this we try to treat him with Imdur and he developed severe headaches.  He had a cough on lisinopril.   Since I last saw him he is back at work.  He denies any cardiovascular symptoms.  He is not having any chest pressure, neck or arm discomfort.  He is not having any shortness of breath, PND or orthopnea.  His job is physical.  He also exercises.  He does deep knee bends and chin ups.  With all of this he denies any cardiovascular symptoms.  He is hoping to go to [place] this year.  He had to cancel a trip to [place] and [place] last year.  Past Medical History:  Diagnosis Date  . Arthritis   . 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 Bloating 2020   suspected to be from chronic probiotic use  . BPH (benign prostatic hyperplasia)   . Chronic renal insufficiency, stage 2 (mild)    borderlines II/III  . Colon polyps 07/2011   hyperplastic 07/25/2011. No further colon ca screening indicated as of 12/2018 GI eval (Dr. Loletha Carrow).  . Coronary artery disease 12/22/2007   Cath 03/2017-->  70 - 80% mid/distal circ stenosis and a small D1 80% stenosis and proximal stents in the prox and mid LAD.   Med mgmt rec'd-->nitrates caused HAs, ACE-I caused cough.  . DDD (degenerative disc disease)    s/p surgery; L/S spine MRI 05/2004 showed DDD/spondylosis with right L3 nerve root abutment, with L5/S1 surgical changes.  .  Diverticulosis of sigmoid colon 07/25/11   Severe (endoscopy by Dr. Sharlett Iles)  . Erectile dysfunction   . Gallbladder polyp 2013   Noted 06/2011.  Stable on u/s f/u 10/2011.  . Gallstone 05/2018   Asymptomatic (picked up on CT abd/pelv).  . Hearing loss 2004   Dr. Tamala Julian, ENT  . History of prostate cancer 12/2014   Robot assisted radical prostatectomy 07/2015: lymph node involvement.  Plan is active surveillance as per urology: As of  09/2018 PSA undetectable.  Marland Kitchen HTN (hypertension)    ACE-I cough.  Fine on ARB.  Marland Kitchen Hyperlipidemia   . NASH (nonalcoholic steatohepatitis) 06/2011   mild transaminasemia (Hep B and C testing neg)  . Pulmonary nodules    small, picked up on CT ab/pelv for abd pains/flank pains-->pt quit smoking 40 yrs ago.  Low risk for lung ca so no f/u CT needed.  . Tinnitus   . Ureterolithiasis    8 mm left distal ureteral stone 05/2018, got ureteroscopy with holmium laser treatment.    Past Surgical History:  Procedure Laterality Date  . APPENDECTOMY  1953  . BACK SURGERY     C-Spine and L-Spine  . CARDIAC CATHETERIZATION    . CARDIOVASCULAR STRESS TEST  2012   Normal  .  COLONOSCOPY  02/26/02; 07/2011   Colonoscopy by Dr. Rowe Pavy 2003 was normal (+hx of polyps (adenomatous?) prior), 07/2011 showed one hyperplastic polyp. NO FURTHER SCREENING COLONOSCOPIES INDICTAED as of 12/2018 GI eval.  . CORONARY STENT PLACEMENT     x 2 (LAD)  . CYSTOSCOPY WITH RETROGRADE PYELOGRAM, URETEROSCOPY AND STENT PLACEMENT Left 05/10/2018   Procedure: CYSTOSCOPY WITH LEFT RETROGRADE PYELOGRAM, LEFT URETEROSCOPY AND LEFT URETERAL STENT PLACEMENT;  Surgeon: Alexis Frock, MD;  Location: [facility];  Service: Urology;  Laterality: Left;  . HOLMIUM LASER APPLICATION Left 10/12/5914   Procedure: HOLMIUM LASER APPLICATION;  Surgeon: Alexis Frock, MD;  Location: [facility];  Service: Urology;  Laterality: Left;  . INCISIONAL HERNIA REPAIR N/A 03/19/2019   Procedure: LAPAROSCOPIC INCISIONAL HERNIA REPAIR WITH  MESH, RECURRENT UMBILICAL HERNIA REPAIR;  Surgeon: Michael Boston, MD;  Location: [facility];  Service: General;  Laterality: N/A;  . INGUINAL HERNIA REPAIR Right 03/19/2019   Procedure: LAPAROSCOPIC BILATERAL FEMORAL AND INGUINAL HERNIA REPAIR WITH MESH, LYSIS OF ADHESIONS;  Surgeon: Michael Boston, MD;  Location: [facility];  Service: General;  Laterality: Right;  . LEFT HEART CATH AND CORONARY ANGIOGRAPHY N/A 03/21/2017   Stents patent; mod circ dz, nothing for intervention--imdur added.  Procedure: LEFT HEART CATH AND CORONARY ANGIOGRAPHY;  Surgeon: Nelva Bush, MD;  Location: [facility];  Service: Cardiovascular;  Laterality: N/A;  . LOBECTOMY     right upper lobectomy for blastomycosis  . LYMPHADENECTOMY Bilateral 07/14/2015   Procedure: LYMPHADENECTOMY;  Surgeon: Raynelle Bring, MD;  Location: [facility];  Service: Urology;  Laterality: Bilateral;  . PROSTATE BIOPSY  04/27/15   4 of 12 bx core's + prostate adenocarcinoma (tentative treatment plan is prostatectomy ? + other therapies (as of 05/18/15)  . ROBOT ASSISTED LAPAROSCOPIC RADICAL PROSTATECTOMY N/A 07/14/2015   One positive pelvic LN.  Procedure: XI ROBOTIC ASSISTED LAPAROSCOPIC RADICAL PROSTATECTOMY LEVEL 2;  Surgeon: Raynelle Bring, MD;  [facility];  Service: Urology;  Laterality: N/A;  . UMBILICAL HERNIA REPAIR  07/14/2015   Procedure: HERNIA REPAIR UMBILICAL ADULT;  Surgeon: Raynelle Bring, MD;  [facility]: [facility];  Service: Urology;;     Current Outpatient Medications  Medication Sig Dispense Refill  . aspirin 81 MG tablet Take 81 mg every evening by mouth.     . celecoxib (CELEBREX) 200 MG capsule TAKE 1 CAPSULE (200 MG) BY MOUTH 2 TIMES A DAY AS NEEDED FOR PAIn (Patient taking differently: Take 200 mg by mouth as needed. ) 60 capsule 3  . Cholecalciferol (VITAMIN D3) 5000 units CAPS Take 5,000 Units every evening by mouth.    . Coenzyme Q10 (COQ10) 100 MG CAPS Take 100 mg every evening by mouth.    . losartan (COZAAR) 25 MG  tablet Take 1 tablet (25 mg total) by mouth daily. (Patient taking differently: Take 25 mg by mouth at bedtime. ) 90 tablet 3  . Multiple Vitamins-Minerals (ICAPS AREDS 2 PO) Take by mouth.    . nitroGLYCERIN (NITROSTAT) 0.4 MG SL tablet PLACE 1 TABLET UNDER THE TONGUE EVERY 5 MINUTES AS NEEDED FOR CHEST PAIN. 25 tablet 2  . rosuvastatin (CRESTOR) 20 MG tablet TAKE 1 TABLET (20 MG TOTAL) BY MOUTH DAILY. MUST KEEP APPOINTMENT 01/23/19 WITH DR [facility] FOR FUTURE REFILLS (Patient taking differently: Take 20 mg by mouth at bedtime. ) 90 tablet 3   No current facility-administered medications for this visit.    Allergies:   Ace inhibitors and Imdur [isosorbide dinitrate]    ROS:  Please see the  history of present illness.   Otherwise, review of systems are positive for none.   All other systems are reviewed and negative.    PHYSICAL EXAM: VS:  BP 116/71   Pulse (!) 50   Ht 5' 8"  (1.727 m)   Wt 157 lb 12.8 oz (71.6 kg)   SpO2 98%   BMI 23.99 kg/m  , BMI Body mass index is 23.99 kg/m. GENERAL:  Well appearing NECK:  No jugular venous distention, waveform within normal limits, carotid upstroke brisk and symmetric, no bruits, no thyromegaly LUNGS:  Clear to auscultation bilaterally CHEST:  Unremarkable HEART:  PMI not displaced or sustained,S1 and S2 within normal limits, no S3, no S4, no clicks, no rubs, soft brief nonradiating apical systolic murmur, no diastolic murmurs ABD:  Flat, positive bowel sounds normal in frequency in pitch, no bruits, no rebound, no guarding, no midline pulsatile mass, no hepatomegaly, no splenomegaly EXT:  2 plus pulses throughout, no edema, no cyanosis no clubbing   EKG:  EKG is ordered today. The ekg ordered today demonstrates sinus bradycardia, rate 50, axis within normal limits, intervals within normal limits, no acute ST-T wave changes.   Recent Labs: 06/03/2019: ALT 18; Hemoglobin 14.3; Platelets 143 11/20/2019: BUN 19; Creat 0.96; Potassium 4.7; Sodium  138    Lipid Panel    Component Value Date/Time   CHOL 90 06/03/2019 1155   TRIG 168 (H) 06/03/2019 1155   HDL 39 (L) 06/03/2019 1155   CHOLHDL 2.3 06/03/2019 1155   VLDL 27.6 11/28/2018 0914   LDLCALC 26 06/03/2019 1155   LDLDIRECT 55.0 05/30/2018 1001      Wt Readings from Last 3 Encounters:  03/11/20 157 lb 12.8 oz (71.6 kg)  11/20/19 161 lb (73 kg)  06/03/19 167 lb (75.8 kg)      Other studies Reviewed: Additional studies/ records that were reviewed today include: Labs. Review of the above records demonstrates:  Please see elsewhere in the note.     ASSESSMENT AND PLAN:  CAD - The patient has no new sypmtoms.  No further cardiovascular testing is indicated.  We will continue with aggressive risk reduction and meds as listed.  DYSLIPIDEMIA -   LDL is 26 with HDL of 39.  No change in therapy.    HTN (hypertension) -  The blood pressure is well controlled.  Continue the meds as listed.    Current medicines are reviewed at length with the patient today.  The patient does not have concerns regarding medicines.  The following changes have been made:  no change  Labs/ tests ordered today include: none  Orders Placed This Encounter  Procedures  . EKG 12-Lead     Disposition:   FU with me in one year.     Signed, Minus Breeding, MD  03/11/2020 4:46 PM    [place]

## 2020-03-11 ENCOUNTER — Other Ambulatory Visit: Payer: Self-pay

## 2020-03-11 ENCOUNTER — Ambulatory Visit (INDEPENDENT_AMBULATORY_CARE_PROVIDER_SITE_OTHER): Payer: 59 | Admitting: Cardiology

## 2020-03-11 ENCOUNTER — Encounter: Payer: Self-pay | Admitting: Cardiology

## 2020-03-11 VITALS — BP 116/71 | HR 50 | Ht 68.0 in | Wt 157.8 lb

## 2020-03-11 DIAGNOSIS — E785 Hyperlipidemia, unspecified: Secondary | ICD-10-CM | POA: Diagnosis not present

## 2020-03-11 DIAGNOSIS — I2510 Atherosclerotic heart disease of native coronary artery without angina pectoris: Secondary | ICD-10-CM

## 2020-03-11 DIAGNOSIS — I10 Essential (primary) hypertension: Secondary | ICD-10-CM

## 2020-03-11 NOTE — Patient Instructions (Signed)
Medication Instructions:  No changes *If you need a refill on your cardiac medications before your next appointment, please call your pharmacy*   Lab Work: None ordered If you have labs (blood work) drawn today and your tests are completely normal, you will receive your results only by: Marland Kitchen MyChart Message (if you have MyChart) OR . A paper copy in the mail If you have any lab test that is abnormal or we need to change your treatment, we will call you to review the results.   Testing/Procedures: None ordered   Follow-Up: At [facility], you and your health needs are our priority.  As part of our continuing mission to provide you with exceptional heart care, we have created designated Provider Care Teams.  These Care Teams include your primary Cardiologist (physician) and Advanced Practice Providers (APPs -  Physician Assistants and Nurse Practitioners) who all work together to provide you with the care you need, when you need it.  We recommend signing up for the patient portal called "MyChart".  Sign up information is provided on this After Visit Summary.  MyChart is used to connect with patients for Virtual Visits (Telemedicine).  Patients are able to view lab/test results, encounter notes, upcoming appointments, etc.  Non-urgent messages can be sent to your provider as well.   To learn more about what you can do with MyChart, go to [email or web address].    Your next appointment:   12 month(s)  The format for your next appointment:   In Person  Provider:   Minus Breeding, MD   Other Instructions None

## 2020-03-26 ENCOUNTER — Other Ambulatory Visit: Payer: Self-pay

## 2020-03-26 ENCOUNTER — Emergency Department (INDEPENDENT_AMBULATORY_CARE_PROVIDER_SITE_OTHER)
Admission: EM | Admit: 2020-03-26 | Discharge: 2020-03-26 | Disposition: A | Discharge status: Home / Self Care | Payer: 59 | Source: Home / Self Care | Attending: Family Medicine | Admitting: Family Medicine

## 2020-03-26 DIAGNOSIS — R21 Rash and other nonspecific skin eruption: Secondary | ICD-10-CM | POA: Diagnosis not present

## 2020-03-26 DIAGNOSIS — T50905A Adverse effect of unspecified drugs, medicaments and biological substances, initial encounter: Secondary | ICD-10-CM

## 2020-03-26 MED ORDER — METHYLPREDNISOLONE ACETATE 80 MG/ML IJ SUSP
80.0000 mg | Freq: Once | INTRAMUSCULAR | Status: AC
  Administered 2020-03-26: 80 mg via INTRAMUSCULAR

## 2020-03-26 NOTE — Discharge Instructions (Addendum)
May continue Benadryl at bedtime if needed for itching.

## 2020-03-26 NOTE — ED Triage Notes (Signed)
Pt states that he was seen at a urgent care and was prescribed Cipro. Pt states that on the third day of the antibiotic he started to break out in a rash all over. x1 week

## 2020-03-26 NOTE — ED Provider Notes (Signed)
[facility]    CSN: [id number] Arrival date & time: 03/26/20  2671      History   Chief Complaint Chief Complaint  Patient presents with  . Rash    HPI William Montgomery is a 79 y.o. male.   Patient was treated for a sinus infection in an urgent care center about 10 days ago with Cipro.  He subsequently developed a generalized pruritic rash, most prominent on trunk and extremities.  He denies shortness of breath, wheezing, difficulty swallowing and feels well otherwise.  The history is provided by the patient.  Rash Location: trunk and extremities. Quality: dryness, itchiness and redness   Quality: not blistering, not bruising, not burning, not draining, not painful, not peeling, not scaling, not swelling and not weeping   Severity:  Moderate Onset quality:  Sudden Duration:  6 days Timing:  Constant Progression:  Unchanged Chronicity:  New Context: medications   Relieved by:  Nothing Worsened by:  Nothing Ineffective treatments:  Topical steroids Associated symptoms: no abdominal pain, no diarrhea, no fatigue, no fever, no headaches, no hoarse voice, no induration, no joint pain, no myalgias, no nausea, no periorbital edema, no shortness of breath, no sore throat, no throat swelling, no tongue swelling, no URI and not wheezing     Past Medical History:  Diagnosis Date  . Arthritis   . 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 Bloating 2020   suspected to be from chronic probiotic use  . BPH (benign prostatic hyperplasia)   . Chronic renal insufficiency, stage 2 (mild)    borderlines II/III  . Colon polyps 07/2011   hyperplastic 07/25/2011. No further colon ca screening indicated as of 12/2018 GI eval (Dr. Loletha Carrow).  . Coronary artery disease 12/22/2007   Cath 03/2017-->  70 - 80% mid/distal circ stenosis and a small D1 80% stenosis and proximal stents in the prox and mid LAD.   Med mgmt rec'd-->nitrates caused HAs, ACE-I caused cough.  . DDD (degenerative  disc disease)    s/p surgery; L/S spine MRI 05/2004 showed DDD/spondylosis with right L3 nerve root abutment, with L5/S1 surgical changes.  . Diverticulosis of sigmoid colon 07/25/11   Severe (endoscopy by Dr. Sharlett Iles)  . Erectile dysfunction   . Gallbladder polyp 2013   Noted 06/2011.  Stable on u/s f/u 10/2011.  . Gallstone 05/2018   Asymptomatic (picked up on CT abd/pelv).  . Hearing loss 2004   Dr. Tamala Julian, ENT  . History of prostate cancer 12/2014   Robot assisted radical prostatectomy 07/2015: lymph node involvement.  Plan is active surveillance as per urology: As of  09/2018 PSA undetectable.  Marland Kitchen HTN (hypertension)    ACE-I cough.  Fine on ARB.  Marland Kitchen Hyperlipidemia   . NASH (nonalcoholic steatohepatitis) 06/2011   mild transaminasemia (Hep B and C testing neg)  . Pulmonary nodules    small, picked up on CT ab/pelv for abd pains/flank pains-->pt quit smoking 40 yrs ago.  Low risk for lung ca so no f/u CT needed.  . Tinnitus   . Ureterolithiasis    8 mm left distal ureteral stone 05/2018, got ureteroscopy with holmium laser treatment.    Patient Active Problem List   Diagnosis Date Noted  . Incisional hernia s/p lap repair with mesh 03/19/2019 03/19/2019  . Recurrent umbilical hernia s/p lap repair with mesh 03/19/2019 03/19/2019  . Bilateral inguinal hernia s/p lap repair with mesh 03/19/2019 03/19/2019  . Bilateral femoral hernia s/p lap repair with mesh  03/19/2019 03/19/2019  . Overweight (BMI 25.0-29.9) 11/28/2018  . Accelerating angina ([place]) 03/21/2017  . Elevated blood pressure reading without diagnosis of hypertension 09/23/2015  . Prostate cancer ([place]) 07/14/2015  . Preventative health care 03/19/2014  . Insomnia 03/19/2014  . Benign prostatic hypertrophy 09/15/2012  . Chronic low back pain 10/15/2011  . Gallbladder polyp 10/15/2011  . Need for shingles vaccine 10/15/2011  . Special screening for malignant neoplasms, colon 07/25/2011  . Personal history of colonic polyps  07/25/2011  . Benign neoplasm of colon 07/25/2011  . Diverticulosis of colon (without mention of hemorrhage) 07/25/2011  . Colon cancer screening 07/12/2011  . Hx of colonic polyp 07/12/2011  . CAD (coronary artery disease) 07/12/2011  . Low back pain 02/13/2011  . Prostate cancer screening 02/13/2011  . HTN (hypertension) 01/03/2011  . Health maintenance examination 10/13/2010  . Transaminasemia 10/13/2010  . Hyperlipidemia 11/06/2008  . Coronary atherosclerosis 11/06/2008  . [place] DISEASE 11/06/2008    Past Surgical History:  Procedure Laterality Date  . APPENDECTOMY  1953  . BACK SURGERY     C-Spine and L-Spine  . CARDIAC CATHETERIZATION    . CARDIOVASCULAR STRESS TEST  2012   Normal  . COLONOSCOPY  02/26/02; 07/2011   Colonoscopy by Dr. Rowe Pavy 2003 was normal (+hx of polyps (adenomatous?) prior), 07/2011 showed one hyperplastic polyp. NO FURTHER SCREENING COLONOSCOPIES INDICTAED as of 12/2018 GI eval.  . CORONARY STENT PLACEMENT     x 2 (LAD)  . CYSTOSCOPY WITH RETROGRADE PYELOGRAM, URETEROSCOPY AND STENT PLACEMENT Left 05/10/2018   Procedure: CYSTOSCOPY WITH LEFT RETROGRADE PYELOGRAM, LEFT URETEROSCOPY AND LEFT URETERAL STENT PLACEMENT;  Surgeon: Alexis Frock, MD;  [facility];  Service: Urology;  Laterality: Left;  . HOLMIUM LASER APPLICATION Left 07/13/7562   Procedure: HOLMIUM LASER APPLICATION;  Surgeon: Alexis Frock, MD;  [facility];  Service: Urology;  Laterality: Left;  . INCISIONAL HERNIA REPAIR N/A 03/19/2019   Procedure: LAPAROSCOPIC INCISIONAL HERNIA REPAIR WITH MESH, RECURRENT UMBILICAL HERNIA REPAIR;  Surgeon: Michael Boston, MD;  [facility];  Service: General;  Laterality: N/A;  . INGUINAL HERNIA REPAIR Right 03/19/2019   Procedure: LAPAROSCOPIC BILATERAL FEMORAL AND INGUINAL HERNIA REPAIR WITH MESH, LYSIS OF ADHESIONS;  Surgeon: Michael Boston, MD;  [facility];  Service: General;  Laterality: Right;  . LEFT HEART CATH AND CORONARY  ANGIOGRAPHY N/A 03/21/2017   Stents patent; mod circ dz, nothing for intervention--imdur added.  Procedure: LEFT HEART CATH AND CORONARY ANGIOGRAPHY;  Surgeon: Nelva Bush, MD;  Location: [facility];  Service: Cardiovascular;  Laterality: N/A;  . LOBECTOMY     right upper lobectomy for blastomycosis  . LYMPHADENECTOMY Bilateral 07/14/2015   Procedure: LYMPHADENECTOMY;  Surgeon: Raynelle Bring, MD;  [facility];  Service: Urology;  Laterality: Bilateral;  . PROSTATE BIOPSY  04/27/15   4 of 12 bx core's + prostate adenocarcinoma (tentative treatment plan is prostatectomy ? + other therapies (as of 05/18/15)  . ROBOT ASSISTED LAPAROSCOPIC RADICAL PROSTATECTOMY N/A 07/14/2015   One positive pelvic LN.  Procedure: XI ROBOTIC ASSISTED LAPAROSCOPIC RADICAL PROSTATECTOMY LEVEL 2;  Surgeon: Raynelle Bring, MD;  [facility];  Service: Urology;  Laterality: N/A;  . UMBILICAL HERNIA REPAIR  07/14/2015   Procedure: HERNIA REPAIR UMBILICAL ADULT;  Surgeon: Raynelle Bring, MD;  [facility];  Service: Urology;;       Home Medications    Prior to Admission medications   Medication Sig Start Date End Date Taking? Authorizing Provider  aspirin 81  MG tablet Take 81 mg every evening by mouth.    Yes [provider]  celecoxib (CELEBREX) 200 MG capsule TAKE 1 CAPSULE (200 MG) BY MOUTH 2 TIMES A DAY AS NEEDED FOR PAIn Patient taking differently: Take 200 mg by mouth as needed.  07/28/18  Yes McGowen, Adrian Blackwater, MD  Cholecalciferol (VITAMIN D3) 5000 units CAPS Take 5,000 Units every evening by mouth.   Yes [provider]  Coenzyme Q10 (COQ10) 100 MG CAPS Take 100 mg every evening by mouth.   Yes [provider]  Multiple Vitamins-Minerals (ICAPS AREDS 2 PO) Take by mouth.   Yes [provider]  rosuvastatin (CRESTOR) 20 MG tablet TAKE 1 TABLET (20 MG TOTAL) BY MOUTH DAILY. MUST KEEP APPOINTMENT 01/23/19 WITH DR [facility] FOR FUTURE REFILLS Patient taking  differently: Take 20 mg by mouth at bedtime.  02/09/19  Yes Minus Breeding, MD  losartan (COZAAR) 25 MG tablet Take 1 tablet (25 mg total) by mouth daily. Patient taking differently: Take 25 mg by mouth at bedtime.  01/23/19 03/11/20  Minus Breeding, MD  nitroGLYCERIN (NITROSTAT) 0.4 MG SL tablet PLACE 1 TABLET UNDER THE TONGUE EVERY 5 MINUTES AS NEEDED FOR CHEST PAIN. 03/15/17   Minus Breeding, MD    Family History Family History  Problem Relation Age of Onset  . Cervical cancer Mother        deceased  . Cirrhosis Father   . Colon cancer Neg Hx     Social History Social History   Tobacco Use  . Smoking status: Former Smoker    Packs/day: 0.50    Years: 20.00    Pack years: 10.00    Types: Cigarettes    Quit date: 05/07/1976    Years since quitting: 43.9  . Smokeless tobacco: Former Systems developer    Types: Chew    Quit date: 05/07/1998  . Tobacco comment: Scoal  Vaping Use  . Vaping Use: Never used  Substance Use Topics  . Alcohol use: Yes    Comment: 1 beer every 6 months  . Drug use: No     Allergies   Ace inhibitors and Imdur [isosorbide dinitrate]   Review of Systems Review of Systems  Constitutional: Negative for activity change, appetite change, chills, diaphoresis, fatigue and fever.  HENT: Negative for hoarse voice, rhinorrhea and sore throat.   Eyes: Negative.   Respiratory: Negative for cough, chest tightness, shortness of breath and wheezing.   Cardiovascular: Negative.   Gastrointestinal: Negative for abdominal pain, diarrhea and nausea.  Genitourinary: Negative.   Musculoskeletal: Negative for arthralgias and myalgias.  Skin: Positive for rash.  Neurological: Negative for headaches.     Physical Exam Triage Vital Signs ED Triage Vitals  Enc Vitals Group     BP 03/26/20 0823 133/79     Pulse Rate 03/26/20 0823 66     Resp --      Temp 03/26/20 0823 (!) 97.5 F (36.4 C)     Temp Source 03/26/20 0823 Oral     SpO2 03/26/20 0823 97 %     Weight 03/26/20  0821 150 lb (68 kg)     Height 03/26/20 0821 5' 8"  (1.727 m)     Head Circumference --      Peak Flow --      Pain Score 03/26/20 0821 0     Pain Loc --      Pain Edu? --      Excl. in [place]? --    No data found.  Updated Vital Signs BP 133/79 (BP Location: Left Arm)   Pulse 66   Temp (!) 97.5 F (36.4 C) (Oral)   Ht 5' 8"  (1.727 m)   Wt 68 kg   SpO2 97%   BMI 22.81 kg/m   Visual Acuity Right Eye Distance:   Left Eye Distance:   Bilateral Distance:    Right Eye Near:   Left Eye Near:    Bilateral Near:     Physical Exam Vitals and nursing note reviewed.  Constitutional:      General: He is not in acute distress. HENT:     Head: Normocephalic.     Right Ear: External ear normal.     Left Ear: External ear normal.     Nose: Nose normal.     Mouth/Throat:     Pharynx: Oropharynx is clear.  Eyes:     Pupils: Pupils are equal, round, and reactive to light.  Cardiovascular:     Rate and Rhythm: Normal rate and regular rhythm.     Heart sounds: Normal heart sounds.  Pulmonary:     Breath sounds: Normal breath sounds.  Abdominal:     Palpations: Abdomen is soft.  Musculoskeletal:     Cervical back: Neck supple.     Right lower leg: No edema.     Left lower leg: No edema.  Lymphadenopathy:     Cervical: No cervical adenopathy.  Skin:    General: Skin is warm and dry.     Findings: Rash present. Rash is macular and papular. Rash is not crusting, nodular, purpuric, pustular, scaling or vesicular.     Comments: Rash consists of small pink 1-56m erythematous slightly raised macules concentrated on trunk and extremities.  No involvement of face.  Neurological:     Mental Status: He is alert and oriented to person, place, and time.      UC Treatments / Results  Labs (all labs ordered are listed, but only abnormal results are displayed) Labs Reviewed - No data to display  EKG   Radiology No results found.  Procedures Procedures (including critical care  time)  Medications Ordered in UC Medications  methylPREDNISolone acetate (DEPO-MEDROL) injection 80 mg (has no administration in time range)    Initial Impression / Assessment and Plan / UC Course  I have reviewed the triage vital signs and the nursing notes.  Pertinent labs & imaging results that were available during my care of the patient were reviewed by me and considered in my medical decision making (see chart for details).    Administered Depo Medrol 831mIM.   Followup with dermatologist if not improved 6 days.   Final Clinical Impressions(s) / UC Diagnoses   Final diagnoses:  Adverse drug reaction, initial encounter  Rash     Discharge Instructions     May continue Benadryl at bedtime if needed for itching.   ED Prescriptions    None        BeKandra NicolasMD 03/26/20 [phone]

## 2020-03-30 ENCOUNTER — Encounter: Payer: Self-pay | Admitting: Family Medicine

## 2020-03-30 ENCOUNTER — Other Ambulatory Visit: Payer: Self-pay

## 2020-03-30 ENCOUNTER — Other Ambulatory Visit: Payer: Self-pay | Admitting: Family Medicine

## 2020-03-30 ENCOUNTER — Ambulatory Visit (INDEPENDENT_AMBULATORY_CARE_PROVIDER_SITE_OTHER): Payer: 59 | Admitting: Family Medicine

## 2020-03-30 VITALS — BP 128/72 | HR 71 | Temp 97.6°F | Ht 68.0 in | Wt 155.2 lb

## 2020-03-30 DIAGNOSIS — L42 Pityriasis rosea: Secondary | ICD-10-CM

## 2020-03-30 MED ORDER — PREDNISONE 20 MG PO TABS: 40.0000 mg | ORAL_TABLET | Freq: Every day | ORAL | 0 refills | Status: DC

## 2020-03-30 MED ORDER — LEVOCETIRIZINE DIHYDROCHLORIDE 5 MG PO TABS: 5.0000 mg | ORAL_TABLET | Freq: Every evening | ORAL | 0 refills | Status: DC

## 2020-03-30 MED FILL — LEVOCETIRIZINE 5 MG TABLET: 5 | 30 days supply | Qty: 30 | Fill #0

## 2020-03-30 MED FILL — predniSONE 20 MG TABS: 20 | 5 days supply | Qty: 10 | Fill #0

## 2020-03-30 NOTE — Patient Instructions (Addendum)
Try not to itch.  Let us know if you need anything. Pityriasis Rosea Pityriasis rosea is a rash that usually appears on the chest, abdomen, and back. It may also appear on the upper arms and upper legs. It usually begins as a single patch, and then more patches start to develop. The rash may cause mild itching, but it normally does not cause other problems. It usually goes away without treatment. However, it may take weeks or months for the rash to go away completely. What are the causes? The cause of this condition is not known. The condition does not spread from person to person (is not contagious). What increases the risk? It is more common in the spring and fall seasons. What are the signs or symptoms? The main symptom of this condition is a rash.  The rash usually begins with a single oval patch that is larger than the ones that follow. This is called a herald patch. It generally appears a week or more before the rest of the rash appears.  When more patches start to develop, they spread quickly on the chest, abdomen, back, arms, and legs. These patches are smaller than the first one.  The patches that make up the rash are usually oval-shaped and pink or red in color. They are usually flat but may sometimes be raised so that they can be felt with a finger. They may also be finely crinkled and have a scaly ring around the edge. Some people may have mild itching and nonspecific symptoms, such as:  Nausea.  Loss of appetite.  Difficulty concentrating.  Headache.  Irritability.  Sore throat.  Mild fever. How is this diagnosed? This condition may be diagnosed based on:  Your medical history and a physical exam.  Tests to rule out other causes. This may include blood tests or a test in which a small sample of skin is removed from the rash (biopsy) and checked in a lab. How is this treated?     Treatment is not usually needed for this condition. The rash will often go away on  its own in 4-8 weeks. In some cases, a health care provider may recommend or prescribe medicine to reduce itching. Follow these instructions at home:  Take or apply over-the-counter and prescription medicines only as told by your health care provider.  Avoid scratching the affected areas of skin.  Do not take hot baths or use a sauna. Use only warm water when bathing or showering. Heat can increase itching. Adding cornstarch to your bath may help to relieve the itching.  Avoid exposure to the sun and other sources of UV light, such as tanning beds, as told by your health care provider. UV light may help the rash go away but may cause unwanted changes in skin color.  Keep all follow-up visits as told by your health care provider. This is important. Contact a health care provider if:  Your rash does not go away in 8 weeks.  Your rash gets much worse.  You have a fever.  You have swelling or pain in the rash area.  You have fluid, blood, or pus coming from the rash area. Summary  Pityriasis rosea is a rash that usually appears on the trunk of the body. It can also appear on the upper arms and upper legs.  The rash usually begins with a single oval patch (herald patch) that appears a week or more before the rest of the rash appears. The herald patch is  larger than the ones that follow.  The rash may cause mild itching, but it usually does not cause other problems. It usually goes away without treatment in 4-8 weeks.  In some cases, a health care provider may recommend or prescribe medicine to reduce itching. This information is not intended to replace advice given to you by your health care provider. Make sure you discuss any questions you have with your health care provider. Document Revised: 04/22/2017 Document Reviewed: 04/22/2017 Elsevier Patient Education  2020 [employer].

## 2020-03-30 NOTE — Progress Notes (Signed)
Chief Complaint  Patient presents with  . Rash    William Montgomery is a 79 y.o. male here for a skin complaint.  Duration: 1 week, started on back and then spread.  Location: torso and upper legs Pruritic? Yes Painful? No Drainage? No New soaps/lotions/topicals/detergents? No Sick contacts? No Other associated symptoms: no fevers or recent illness Therapies tried thus far: Benadryl cream  Past Medical History:  Diagnosis Date  . Arthritis   . 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 Bloating 2020   suspected to be from chronic probiotic use  . BPH (benign prostatic hyperplasia)   . Chronic renal insufficiency, stage 2 (mild)    borderlines II/III  . Colon polyps 07/2011   hyperplastic 07/25/2011. No further colon ca screening indicated as of 12/2018 GI eval (Dr. Loletha Carrow).  . Coronary artery disease 12/22/2007   Cath 03/2017-->  70 - 80% mid/distal circ stenosis and a small D1 80% stenosis and proximal stents in the prox and mid LAD.   Med mgmt rec'd-->nitrates caused HAs, ACE-I caused cough.  . DDD (degenerative disc disease)    s/p surgery; L/S spine MRI 05/2004 showed DDD/spondylosis with right L3 nerve root abutment, with L5/S1 surgical changes.  . Diverticulosis of sigmoid colon 07/25/11   Severe (endoscopy by Dr. Sharlett Iles)  . Erectile dysfunction   . Gallbladder polyp 2013   Noted 06/2011.  Stable on u/s f/u 10/2011.  . Gallstone 05/2018   Asymptomatic (picked up on CT abd/pelv).  . Hearing loss 2004   Dr. Tamala Julian, ENT  . History of prostate cancer 12/2014   Robot assisted radical prostatectomy 07/2015: lymph node involvement.  Plan is active surveillance as per urology: As of  09/2018 PSA undetectable.  Marland Kitchen HTN (hypertension)    ACE-I cough.  Fine on ARB.  Marland Kitchen Hyperlipidemia   . NASH (nonalcoholic steatohepatitis) 06/2011   mild transaminasemia (Hep B and C testing neg)  . Pulmonary nodules    small, picked up on CT ab/pelv for abd pains/flank pains-->pt quit smoking 40 yrs  ago.  Low risk for lung ca so no f/u CT needed.  . Tinnitus   . Ureterolithiasis    8 mm left distal ureteral stone 05/2018, got ureteroscopy with holmium laser treatment.    BP 128/72 (BP Location: Left Arm, Patient Position: Sitting, Cuff Size: Normal)   Pulse 71   Temp 97.6 F (36.4 C) (Oral)   Ht 5' 8"  (1.727 m)   Wt 155 lb 4 oz (70.4 kg)   SpO2 97%   BMI 23.61 kg/m  Gen: awake, alert, appearing stated age Lungs: No accessory muscle use Skin: There are salmon colored papules and patches confluent over the ant torso and back. There are excoriations from where he was scratching.  No drainage, erythema, TTP, fluctuance, excessive warmth Psych: Age appropriate judgment and insight  Pityriasis rosea - Plan: levocetirizine (XYZAL) 5 MG tablet, predniSONE (DELTASONE) 20 MG tablet  Pred burst as he cannot reach areas on his back. Start PO antihist. Doubt he needs antiviral. Reassurance that this is self-limited. F/u prn. The patient voiced understanding and agreement to the plan.  [place], DO 03/30/20 8:54 AM

## 2020-05-25 ENCOUNTER — Encounter: Payer: 59 | Admitting: Family Medicine

## 2020-05-27 DIAGNOSIS — C61 Malignant neoplasm of prostate: Secondary | ICD-10-CM | POA: Diagnosis not present

## 2020-05-27 LAB — PSA: PSA: <0.015

## 2020-05-30 ENCOUNTER — Telehealth: Payer: Self-pay | Admitting: Family Medicine

## 2020-05-30 NOTE — Telephone Encounter (Signed)
Left message for patient to schedule Annual Wellness Visit.  Please schedule with Nurse Health Advisor Caroleen Hamman, RN at [facility]

## 2020-05-30 NOTE — Telephone Encounter (Signed)
Left message for patient to schedule Annual Wellness Visit.  Please schedule with Nurse Health Advisor Martha Stanley, RN at [facility]  

## 2020-06-01 DIAGNOSIS — Z8546 Personal history of malignant neoplasm of prostate: Secondary | ICD-10-CM | POA: Diagnosis not present

## 2020-06-06 ENCOUNTER — Encounter: Payer: Self-pay | Admitting: Family Medicine

## 2020-07-01 ENCOUNTER — Encounter: Payer: Self-pay | Admitting: Family Medicine

## 2020-07-01 ENCOUNTER — Ambulatory Visit (INDEPENDENT_AMBULATORY_CARE_PROVIDER_SITE_OTHER): Payer: 59 | Admitting: Family Medicine

## 2020-07-01 ENCOUNTER — Encounter: Payer: 59 | Admitting: Family Medicine

## 2020-07-01 ENCOUNTER — Other Ambulatory Visit: Payer: Self-pay

## 2020-07-01 VITALS — BP 148/81 | HR 51 | Temp 97.4°F | Resp 16 | Ht 67.5 in | Wt 156.0 lb

## 2020-07-01 DIAGNOSIS — Z0000 Encounter for general adult medical examination without abnormal findings: Secondary | ICD-10-CM | POA: Diagnosis not present

## 2020-07-01 DIAGNOSIS — E7800 Pure hypercholesterolemia, unspecified: Secondary | ICD-10-CM | POA: Diagnosis not present

## 2020-07-01 DIAGNOSIS — I2510 Atherosclerotic heart disease of native coronary artery without angina pectoris: Secondary | ICD-10-CM | POA: Diagnosis not present

## 2020-07-01 DIAGNOSIS — I10 Essential (primary) hypertension: Secondary | ICD-10-CM

## 2020-07-01 DIAGNOSIS — N182 Chronic kidney disease, stage 2 (mild): Secondary | ICD-10-CM

## 2020-07-01 LAB — COMPREHENSIVE METABOLIC PANEL
ALT: 20 U/L (ref 0–53)
AST: 19 U/L (ref 0–37)
Albumin: 4.8 g/dL (ref 3.5–5.2)
Alkaline Phosphatase: 90 U/L (ref 39–117)
BUN: 20 mg/dL (ref 6–23)
CO2: 29 mEq/L (ref 19–32)
Calcium: 9.7 mg/dL (ref 8.4–10.5)
Chloride: 101 mEq/L (ref 96–112)
Creatinine, Ser: 1.07 mg/dL (ref 0.40–1.50)
GFR: 66.42 mL/min (ref >60.00)
Glucose, Bld: 95 mg/dL (ref 70–99)
Potassium: 5 mEq/L (ref 3.5–5.1)
Sodium: 137 mEq/L (ref 135–145)
Total Bilirubin: 0.9 mg/dL (ref 0.2–1.2)
Total Protein: 7.3 g/dL (ref 6.0–8.3)

## 2020-07-01 LAB — CBC WITH DIFFERENTIAL/PLATELET
Basophils Absolute: 0 10*3/uL (ref 0.0–0.1)
Basophils Relative: 0.2 % (ref 0.0–3.0)
Eosinophils Absolute: 0.1 10*3/uL (ref 0.0–0.7)
Eosinophils Relative: 3.3 % (ref 0.0–5.0)
HCT: 43.5 % (ref 39.0–52.0)
Hemoglobin: 14.7 g/dL (ref 13.0–17.0)
Lymphocytes Relative: 35.2 % (ref 12.0–46.0)
Lymphs Abs: 1.2 10*3/uL (ref 0.7–4.0)
MCHC: 33.7 g/dL (ref 30.0–36.0)
MCV: 98.8 fl (ref 78.0–100.0)
Monocytes Absolute: 0.2 10*3/uL (ref 0.1–1.0)
Monocytes Relative: 5.5 % (ref 3.0–12.0)
Neutro Abs: 1.9 10*3/uL (ref 1.4–7.7)
Neutrophils Relative %: 55.8 % (ref 43.0–77.0)
Platelets: 148 10*3/uL — ABNORMAL LOW (ref 150.0–400.0)
RBC: 4.4 Mil/uL (ref 4.22–5.81)
RDW: 14.1 % (ref 11.5–15.5)
WBC: 3.4 10*3/uL — ABNORMAL LOW (ref 4.0–10.5)

## 2020-07-01 LAB — LIPID PANEL
Cholesterol: 221 mg/dL — ABNORMAL HIGH (ref 0–200)
HDL: 47.2 mg/dL (ref >39.00)
LDL Cholesterol: 153 mg/dL — ABNORMAL HIGH (ref 0–99)
NonHDL: 173.72
Total CHOL/HDL Ratio: 5
Triglycerides: 104 mg/dL (ref 0.0–149.0)
VLDL: 20.8 mg/dL (ref 0.0–40.0)

## 2020-07-01 NOTE — Progress Notes (Signed)
Office Note 07/01/2020  CC:  Chief Complaint  Patient presents with  . Annual Exam    Pt is fasting    HPI:  William Montgomery is a 80 y.o. White male who is here for annual health maintenance exam and 6 mo f/u HTN, HLD (with hepatic steatosis), CAD, and CRI II/III. A/P as of last visit: "1) HTN: The current medical regimen is effective;  continue present plan and medications. BMET today.  2) HLD: tolerating statin. LDL was 26 and hepatic panel normal 6 mo ago. Plan rpt labs 6 mo.  3) CAD, hx of stent.  No BB b/c resting bradycardia. ASA and statin.  Continue routine cardiology f/u.  4) CRI II/III: avoiding NSAIDs almost completely.  Good hydration habits. Lytes/cr today.  5) Prostate ca: he is a little over 4 yrs out from surgery and PSAs have been undetectable on q4mourology f/u."  INTERIM HX: Doing fine, feels well. Stopped taking his meds as of July 2021 b/c got tired of taking meds, expresses bitterness/distrust of pharmaceutical companies.  Last cardiology f/u was about 3 mo ago, note reviewed, all was stable and plan was for ongoing medical mgmt/RF reduction. Daily home bp checks 120s/70s.  Drinks lots of water.  EAts one meal a day. Has cut out sugars.  One double expresso every morning.  Walks regularly for exercise, does some basic home strengthening exercises. 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     Past Medical History:  Diagnosis Date  . Arthritis   . 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 Bloating 2020   suspected to be from chronic probiotic use  . BPH (benign prostatic hyperplasia)   . Chronic renal insufficiency, stage 2 (mild)    borderlines II/III  . Colon polyps 07/2011   hyperplastic 07/25/2011. No further colon ca screening indicated as of 12/2018 GI eval (Dr. DLoletha Carrow.  . Coronary artery disease 12/22/2007   Cath 03/2017-->  70 - 80% mid/distal circ stenosis and  a small D1 80% stenosis and proximal stents in the prox and mid LAD.   Med mgmt rec'd-->nitrates caused HAs, ACE-I caused cough.  . DDD (degenerative disc disease)    s/p surgery; L/S spine MRI 05/2004 showed DDD/spondylosis with right L3 nerve root abutment, with L5/S1 surgical changes.  . Diverticulosis of sigmoid colon 07/25/11   Severe (endoscopy by Dr. PSharlett Iles  . Erectile dysfunction   . Gallbladder polyp 2013   Noted 06/2011.  Stable on u/s f/u 10/2011.  . Gallstone 05/2018   Asymptomatic (picked up on CT abd/pelv).  . Hearing loss 2004   Dr. STamala Julian ENT  . History of prostate cancer 12/2014   Robot assisted radical prostatectomy 07/2015: lymph node involvement.  Plan is active surveillance as per urology: As of 05/2020 PSA undetectable.  .Marland KitchenHTN (hypertension)    ACE-I cough.  Fine on ARB.  .Marland KitchenHyperlipidemia   . NASH (nonalcoholic steatohepatitis) 06/2011   mild transaminasemia (Hep B and C testing neg)  . Pulmonary nodules    small, picked up on CT ab/pelv for abd pains/flank pains-->pt quit smoking 40 yrs ago.  Low risk for lung ca so no f/u CT needed.  . Tinnitus   . Ureterolithiasis    8 mm left distal ureteral stone 05/2018, got ureteroscopy with holmium laser treatment.    Past Surgical History:  Procedure Laterality Date  . APPENDECTOMY  1953  . BACK SURGERY  C-Spine and L-Spine  . CARDIAC CATHETERIZATION    . CARDIOVASCULAR STRESS TEST  2012   Normal  . COLONOSCOPY  02/26/02; 07/2011   Colonoscopy by Dr. Rowe Pavy 2003 was normal (+hx of polyps (adenomatous?) prior), 07/2011 showed one hyperplastic polyp. NO FURTHER SCREENING COLONOSCOPIES INDICTAED as of 12/2018 GI eval.  . CORONARY STENT PLACEMENT     x 2 (LAD)  . CYSTOSCOPY WITH RETROGRADE PYELOGRAM, URETEROSCOPY AND STENT PLACEMENT Left 05/10/2018   Procedure: CYSTOSCOPY WITH LEFT RETROGRADE PYELOGRAM, LEFT URETEROSCOPY AND LEFT URETERAL STENT PLACEMENT;  Surgeon: Alexis Frock, MD;  Location: [facility];  Service: Urology;   Laterality: Left;  . HOLMIUM LASER APPLICATION Left 07/09/3612   Procedure: HOLMIUM LASER APPLICATION;  Surgeon: Alexis Frock, MD;  Location: [facility];  Service: Urology;  Laterality: Left;  . INCISIONAL HERNIA REPAIR N/A 03/19/2019   Procedure: LAPAROSCOPIC INCISIONAL HERNIA REPAIR WITH MESH, RECURRENT UMBILICAL HERNIA REPAIR;  Surgeon: Michael Boston, MD;  Location: [facility];  Service: General;  Laterality: N/A;  . INGUINAL HERNIA REPAIR Right 03/19/2019   Procedure: LAPAROSCOPIC BILATERAL FEMORAL AND INGUINAL HERNIA REPAIR WITH MESH, LYSIS OF ADHESIONS;  Surgeon: Michael Boston, MD;  Location: [facility];  Service: General;  Laterality: Right;  . LEFT HEART CATH AND CORONARY ANGIOGRAPHY N/A 03/21/2017   Stents patent; mod circ dz, nothing for intervention--imdur added.  Procedure: LEFT HEART CATH AND CORONARY ANGIOGRAPHY;  Surgeon: Nelva Bush, MD;  Location: [facility];  Service: Cardiovascular;  Laterality: N/A;  . LOBECTOMY     right upper lobectomy for blastomycosis  . LYMPHADENECTOMY Bilateral 07/14/2015   Procedure: LYMPHADENECTOMY;  Surgeon: Raynelle Bring, MD;  Location: [facility];  Service: Urology;  Laterality: Bilateral;  . PROSTATE BIOPSY  04/27/15   4 of 12 bx core's + prostate adenocarcinoma (tentative treatment plan is prostatectomy ? + other therapies (as of 05/18/15)  . ROBOT ASSISTED LAPAROSCOPIC RADICAL PROSTATECTOMY N/A 07/14/2015   One positive pelvic LN.  Procedure: XI ROBOTIC ASSISTED LAPAROSCOPIC RADICAL PROSTATECTOMY LEVEL 2;  Surgeon: Raynelle Bring, MD;  Location: [facility];  Service: Urology;  Laterality: N/A;  . UMBILICAL HERNIA REPAIR  07/14/2015   Procedure: HERNIA REPAIR UMBILICAL ADULT;  Surgeon: Raynelle Bring, MD;  Location: [facility];  Service: Urology;;    Family History  Problem Relation Age of Onset  . Cervical cancer Mother        deceased  . Cirrhosis Father   . Colon cancer Neg Hx     Social History   Socioeconomic History  . Marital status: Married     Spouse name: Not on file  . Number of children: 2  . Years of education: Not on file  . Highest education level: Not on file  Occupational History  . Occupation: Retired  Tobacco Use  . Smoking status: Former Smoker    Packs/day: 0.50    Years: 20.00    Pack years: 10.00    Types: Cigarettes    Quit date: 05/07/1976    Years since quitting: 44.1  . Smokeless tobacco: Former Systems developer    Types: Chew    Quit date: 05/07/1998  . Tobacco comment: Scoal  Vaping Use  . Vaping Use: Never used  Substance and Sexual Activity  . Alcohol use: Yes    Comment: 1 beer every 6 months  . Drug use: No  . Sexual activity: Not on file  Other Topics Concern  . Not on file  Social History Narrative   Married, 2 adult children ([place]).  Lives in [place].     Occ: Retired from [employer] ([place] work) at age 54.   Tob (smoke and chew) x 20 yrs, quit approx 1980s.   Alcohol: very rare.   One cup coffee each morning.   Exercise: walks 3 miles about 4 times per week.   Active lifestyle.   Social Determinants of Health   Financial Resource Strain: Not on file  Food Insecurity: Not on file  Transportation Needs: Not on file  Physical Activity: Not on file  Stress: Not on file  Social Connections: Not on file  Intimate Partner Violence: Not on file    Outpatient Medications Prior to Visit  Medication Sig Dispense Refill  . Cholecalciferol (VITAMIN D3) 5000 units CAPS Take 5,000 Units every evening by mouth.    . Multiple Vitamins-Minerals (ICAPS AREDS 2 PO) Take by mouth.    Marland Kitchen aspirin 81 MG tablet Take 81 mg every evening by mouth.  (Patient not taking: Reported on 07/01/2020)    . celecoxib (CELEBREX) 200 MG capsule TAKE 1 CAPSULE (200 MG) BY MOUTH 2 TIMES A DAY AS NEEDED FOR PAIn (Patient not taking: Reported on 07/01/2020) 60 capsule 3  . Coenzyme Q10 (COQ10) 100 MG CAPS Take 100 mg every evening by mouth. (Patient not taking: Reported on 07/01/2020)    . levocetirizine (XYZAL) 5 MG  tablet Take 1 tablet (5 mg total) by mouth every evening. (Patient not taking: Reported on 07/01/2020) 30 tablet 0  . losartan (COZAAR) 25 MG tablet Take 1 tablet (25 mg total) by mouth daily. (Patient taking differently: Take 25 mg by mouth at bedtime. ) 90 tablet 3  . nitroGLYCERIN (NITROSTAT) 0.4 MG SL tablet PLACE 1 TABLET UNDER THE TONGUE EVERY 5 MINUTES AS NEEDED FOR CHEST PAIN. (Patient not taking: Reported on 07/01/2020) 25 tablet 2  . rosuvastatin (CRESTOR) 20 MG tablet TAKE 1 TABLET (20 MG TOTAL) BY MOUTH DAILY. MUST KEEP APPOINTMENT 01/23/19 WITH DR [facility] FOR FUTURE REFILLS (Patient not taking: Reported on 07/01/2020) 90 tablet 3   No facility-administered medications prior to visit.    Allergies  Allergen Reactions  . Ace Inhibitors Cough  . Imdur [Isosorbide Dinitrate] Other (See Comments)    Headache    ROS Review of Systems  Constitutional: Negative for appetite change, chills, fatigue and fever.  HENT: Negative for congestion, dental problem, ear pain and sore throat.   Eyes: Negative for discharge, redness and visual disturbance.  Respiratory: Negative for cough, chest tightness, shortness of breath and wheezing.   Cardiovascular: Negative for chest pain, palpitations and leg swelling.  Gastrointestinal: Negative for abdominal pain, blood in stool, diarrhea, nausea and vomiting.  Genitourinary: Negative for difficulty urinating, dysuria, flank pain, frequency, hematuria and urgency.  Musculoskeletal: Negative for arthralgias, back pain, joint swelling, myalgias and neck stiffness.  Skin: Negative for pallor and rash.  Neurological: Negative for dizziness, speech difficulty, weakness and headaches.  Hematological: Negative for adenopathy. Does not bruise/bleed easily.  Psychiatric/Behavioral: Negative for confusion and sleep disturbance. The patient is not nervous/anxious.     PE; Vitals with BMI 07/01/2020 03/30/2020 03/26/2020  Height 5' 7.5" 5' 8"  5' 8"   Weight 156  lbs 155 lbs 4 oz 150 lbs  BMI 24.06 37.34 28.76  Systolic 811 572 620  Diastolic 81 72 79  Pulse 51 71 66    Gen: Alert, well appearing.  Patient is oriented to person, place, time, and situation. AFFECT: pleasant, lucid thought and speech. ENT: Ears: EACs clear, normal epithelium.  TMs with good light reflex and landmarks bilaterally.  Eyes: no injection, icteris, swelling, or exudate.  EOMI, PERRLA. Nose: no drainage or turbinate edema/swelling.  No injection or focal lesion.  Mouth: lips without lesion/swelling.  Oral mucosa pink and moist.  Dentition intact and without obvious caries or gingival swelling.  Oropharynx without erythema, exudate, or swelling.  Neck: supple/nontender.  No LAD, mass, or TM.  Carotid pulses 2+ bilaterally, without bruits. CV: RRR, no m/r/g.   LUNGS: CTA bilat, nonlabored resps, good aeration in all lung fields. ABD: soft, NT, ND, BS normal.  No hepatospenomegaly or mass.  No bruits. EXT: no clubbing, cyanosis, or edema.  Musculoskeletal: no joint swelling, erythema, warmth, or tenderness.  ROM of all joints intact. Skin - no sores or suspicious lesions or rashes or color changes   Pertinent labs:  Lab Results  Component Value Date   TSH 3.080 03/15/2017   Lab Results  Component Value Date   WBC 4.7 06/03/2019   HGB 14.3 06/03/2019   HCT 43.6 06/03/2019   MCV 98.4 06/03/2019   PLT 143 06/03/2019   Lab Results  Component Value Date   CREATININE 0.96 11/20/2019   BUN 19 11/20/2019   NA 138 11/20/2019   K 4.7 11/20/2019   CL 102 11/20/2019   CO2 24 11/20/2019   Lab Results  Component Value Date   ALT 18 06/03/2019   AST 19 06/03/2019   ALKPHOS 92 11/28/2018   BILITOT 0.5 06/03/2019   Lab Results  Component Value Date   CHOL 90 06/03/2019   Lab Results  Component Value Date   HDL 39 (L) 06/03/2019   Lab Results  Component Value Date   LDLCALC 26 06/03/2019   Lab Results  Component Value Date   TRIG 168 (H) 06/03/2019   Lab  Results  Component Value Date   CHOLHDL 2.3 06/03/2019   Lab Results  Component Value Date   PSA <0.015 07/20/2016   PSA 5.69 (H) 12/24/2014   PSA 4.03 (H) 09/16/2014   ASSESSMENT AND PLAN:   1) HTN: great home bp's on no meds since 11/2019 per pt. He declines restart of meds at this time. Lytes/cr today.  2) HLD in the setting of CAD: pt self d/c'd his statin (and ASA and bp med) 11/2019. FLP and hepatic panel today. He will be following up with his cardiologist later this year.  3) CRI II/III: hydrates well, avoids NSAIDs. Lytes/cr today.  4) Health maintenance exam: Reviewed age and gender appropriate health maintenance issues (prudent diet, regular exercise, health risks of tobacco and excessive alcohol, use of seatbelts, fire alarms in home, use of sunscreen).  Also reviewed age and gender appropriate health screening as well as vaccine recommendations. Vaccines: Flu->UTD.  Otherwise all UTD. Labs:  Cbc, cmet, flp Colon ca screening: no further screening colonoscopies indicated -->as per 12/2018 GI eval. Prostate ca screening: hx of prostate ca->he is a little over 4 yrs out from surgery and PSAs have been undetectable on q67mourology f/u."  An After Visit Summary was printed and given to the patient.  FOLLOW UP:  Return in about 6 months (around 12/29/2020) for routine chronic illness f/u.  Signed:  PCrissie Sickles MD           07/01/2020

## 2020-07-06 ENCOUNTER — Encounter: Payer: Self-pay | Admitting: Family Medicine

## 2020-07-19 ENCOUNTER — Other Ambulatory Visit: Payer: Self-pay

## 2020-07-19 ENCOUNTER — Encounter (INDEPENDENT_AMBULATORY_CARE_PROVIDER_SITE_OTHER): Payer: 59 | Admitting: Ophthalmology

## 2020-07-19 DIAGNOSIS — H353132 Nonexudative age-related macular degeneration, bilateral, intermediate dry stage: Secondary | ICD-10-CM

## 2020-07-19 DIAGNOSIS — H43813 Vitreous degeneration, bilateral: Secondary | ICD-10-CM | POA: Diagnosis not present

## 2020-07-19 DIAGNOSIS — H2513 Age-related nuclear cataract, bilateral: Secondary | ICD-10-CM

## 2020-08-31 ENCOUNTER — Ambulatory Visit (INDEPENDENT_AMBULATORY_CARE_PROVIDER_SITE_OTHER): Payer: 59

## 2020-08-31 ENCOUNTER — Ambulatory Visit: Payer: 59

## 2020-08-31 VITALS — Ht 67.5 in | Wt 156.0 lb

## 2020-08-31 DIAGNOSIS — Z0000 Encounter for general adult medical examination without abnormal findings: Secondary | ICD-10-CM | POA: Diagnosis not present

## 2020-08-31 NOTE — Progress Notes (Signed)
Subjective:   William Montgomery is a 80 y.o. male who presents for Medicare Annual/Subsequent preventive examination.  I connected with Gonzalo today by telephone and verified that I am speaking with the correct person using two identifiers. Location patient: home Location provider: work Persons participating in the virtual visit: patient, Marine scientist.    I discussed the limitations, risks, security and privacy concerns of performing an evaluation and management service by telephone and the availability of in person appointments. I also discussed with the patient that there may be a patient responsible charge related to this service. The patient expressed understanding and verbally consented to this telephonic visit.    Interactive audio and video telecommunications were attempted between this provider and patient, however failed, due to patient having technical difficulties OR patient did not have access to video capability.  We continued and completed visit with audio only.  Some vital signs may be absent or patient reported.   Time Spent with patient on telephone encounter: 20 minutes   Review of Systems     Cardiac Risk Factors include: advanced age (>83mn, >>37women);male gender;dyslipidemia;hypertension     Objective:    Today's Vitals   08/31/20 0816  Weight: 156 lb (70.8 kg)  Height: 5' 7.5" (1.715 m)   Body mass index is 24.07 kg/m.  Advanced Directives 08/31/2020 03/19/2019 03/16/2019 05/30/2018 05/09/2018 05/08/2018 05/03/2017  Does Patient Have a Medical Advance Directive? Yes Yes Yes Yes Yes No Yes  Type of [place]amed[place]of [place] will Living will Living will Living will;Healthcare Power of [place] will  Does patient want to make changes to medical advance directive? - No - Patient declined No - Patient declined - - - -  Copy of [place] Chart? Yes - validated most recent copy scanned in  chart (See row information) No - copy requested No - copy requested Yes - validated most recent copy scanned in chart (See row information) - - Yes  Would patient like information on creating a medical advance directive? - - - - - No - Patient declined -    Current Medications (verified) Outpatient Encounter Medications as of 08/31/2020  Medication Sig  . Cholecalciferol (VITAMIN D3) 5000 units CAPS Take 5,000 Units every evening by mouth.  . Multiple Vitamins-Minerals (ICAPS AREDS 2 PO) Take by mouth.  .Marland Kitchenaspirin 81 MG tablet Take 81 mg every evening by mouth.  (Patient not taking: No sig reported)  . celecoxib (CELEBREX) 200 MG capsule TAKE 1 CAPSULE (200 MG) BY MOUTH 2 TIMES A DAY AS NEEDED FOR PAIn (Patient not taking: No sig reported)  . Coenzyme Q10 (COQ10) 100 MG CAPS Take 100 mg every evening by mouth. (Patient not taking: No sig reported)  . levocetirizine (XYZAL) 5 MG tablet TAKE 1 TABLET (5 MG TOTAL) BY MOUTH EVERY EVENING. (Patient not taking: No sig reported)  . losartan (COZAAR) 25 MG tablet Take 1 tablet (25 mg total) by mouth daily. (Patient taking differently: Take 25 mg by mouth at bedtime. )  . nitroGLYCERIN (NITROSTAT) 0.4 MG SL tablet PLACE 1 TABLET UNDER THE TONGUE EVERY 5 MINUTES AS NEEDED FOR CHEST PAIN. (Patient not taking: No sig reported)  . rosuvastatin (CRESTOR) 20 MG tablet TAKE 1 TABLET (20 MG TOTAL) BY MOUTH DAILY. MUST KEEP APPOINTMENT 01/23/19 [facility] (Patient not taking: No sig reported)  . [DISCONTINUED] predniSONE (DELTASONE) 20 MG tablet TAKE 2 TABLETS (40 MG  TOTAL) BY MOUTH DAILY WITH BREAKFAST FOR 5 DAYS.   No facility-administered encounter medications on file as of 08/31/2020.    Allergies (verified) Ace inhibitors and Imdur [isosorbide dinitrate]   History: Past Medical History:  Diagnosis Date  . Arthritis   . 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 Bloating 2020   suspected to be from chronic probiotic use  . BPH  (benign prostatic hyperplasia)   . Chronic renal insufficiency, stage 2 (mild)    borderlines II/III  . Colon polyps 07/2011   hyperplastic 07/25/2011. No further colon ca screening indicated as of 12/2018 GI eval (Dr. Loletha Carrow).  . Coronary artery disease 12/22/2007   Cath 03/2017-->  70 - 80% mid/distal circ stenosis and a small D1 80% stenosis and proximal stents in the prox and mid LAD.   Med mgmt rec'd-->nitrates caused HAs, ACE-I caused cough.  . DDD (degenerative disc disease)    s/p surgery; L/S spine MRI 05/2004 showed DDD/spondylosis with right L3 nerve root abutment, with L5/S1 surgical changes.  . Diverticulosis of sigmoid colon 07/25/11   Severe (endoscopy by Dr. Sharlett Iles)  . Erectile dysfunction   . Gallbladder polyp 2013   Noted 06/2011.  Stable on u/s f/u 10/2011.  . Gallstone 05/2018   Asymptomatic (picked up on CT abd/pelv).  . Hearing loss 2004   Dr. Tamala Julian, ENT  . History of prostate cancer 12/2014   Robot assisted radical prostatectomy 07/2015: lymph node involvement.  Plan is active surveillance as per urology: As of 05/2020 PSA undetectable.  Marland Kitchen HTN (hypertension)    ACE-I cough.  Fine on ARB.  Marland Kitchen Hyperlipidemia   . NASH (nonalcoholic steatohepatitis) 06/2011   mild transaminasemia (Hep B and C testing neg)  . Pulmonary nodules    small, picked up on CT ab/pelv for abd pains/flank pains-->pt quit smoking 40 yrs ago.  Low risk for lung ca so no f/u CT needed.  . Tinnitus   . Ureterolithiasis    8 mm left distal ureteral stone 05/2018, got ureteroscopy with holmium laser treatment.   Past Surgical History:  Procedure Laterality Date  . APPENDECTOMY  1953  . BACK SURGERY     C-Spine and L-Spine  . CARDIAC CATHETERIZATION    . CARDIOVASCULAR STRESS TEST  2012   Normal  . COLONOSCOPY  02/26/02; 07/2011   Colonoscopy by Dr. Rowe Pavy 2003 was normal (+hx of polyps (adenomatous?) prior), 07/2011 showed one hyperplastic polyp. NO FURTHER SCREENING COLONOSCOPIES INDICTAED as of  12/2018 GI eval.  . CORONARY STENT PLACEMENT     x 2 (LAD)  . CYSTOSCOPY WITH RETROGRADE PYELOGRAM, URETEROSCOPY AND STENT PLACEMENT Left 05/10/2018   Procedure: CYSTOSCOPY WITH LEFT RETROGRADE PYELOGRAM, LEFT URETEROSCOPY AND LEFT URETERAL STENT PLACEMENT;  Surgeon: Alexis Frock, MD;  Location: [facility];  Service: Urology;  Laterality: Left;  . HOLMIUM LASER APPLICATION Left 06/10/5571   Procedure: HOLMIUM LASER APPLICATION;  Surgeon: Alexis Frock, MD;  Location: [facility];  Service: Urology;  Laterality: Left;  . INCISIONAL HERNIA REPAIR N/A 03/19/2019   Procedure: LAPAROSCOPIC INCISIONAL HERNIA REPAIR WITH MESH, RECURRENT UMBILICAL HERNIA REPAIR;  Surgeon: Michael Boston, MD;  Location: [facility];  Service: General;  Laterality: N/A;  . INGUINAL HERNIA REPAIR Right 03/19/2019   Procedure: LAPAROSCOPIC BILATERAL FEMORAL AND INGUINAL HERNIA REPAIR WITH MESH, LYSIS OF ADHESIONS;  Surgeon: Michael Boston, MD;  Location: [facility];  Service: General;  Laterality: Right;  . LEFT HEART CATH AND CORONARY ANGIOGRAPHY N/A 03/21/2017   Stents patent; mod circ  dz, nothing for intervention--imdur added.  Procedure: LEFT HEART CATH AND CORONARY ANGIOGRAPHY;  Surgeon: Nelva Bush, MD;  Location: [facility];  Service: Cardiovascular;  Laterality: N/A;  . LOBECTOMY     right upper lobectomy for blastomycosis  . LYMPHADENECTOMY Bilateral 07/14/2015   Procedure: LYMPHADENECTOMY;  Surgeon: Raynelle Bring, MD;  Location: [facility];  Service: Urology;  Laterality: Bilateral;  . PROSTATE BIOPSY  04/27/15   4 of 12 bx core's + prostate adenocarcinoma (tentative treatment plan is prostatectomy ? + other therapies (as of 05/18/15)  . ROBOT ASSISTED LAPAROSCOPIC RADICAL PROSTATECTOMY N/A 07/14/2015   One positive pelvic LN.  Procedure: XI ROBOTIC ASSISTED LAPAROSCOPIC RADICAL PROSTATECTOMY LEVEL 2;  Surgeon: Raynelle Bring, MD;  Location: [facility];  Service: Urology;  Laterality: N/A;  . UMBILICAL HERNIA REPAIR  07/14/2015    Procedure: HERNIA REPAIR UMBILICAL ADULT;  Surgeon: Raynelle Bring, MD;  Location: [facility];  Service: Urology;;   Family History  Problem Relation Age of Onset  . Cervical cancer Mother        deceased  . Cirrhosis Father   . Colon cancer Neg Hx    Social History   Socioeconomic History  . Marital status: Married    Spouse name: Not on file  . Number of children: 2  . Years of education: Not on file  . Highest education level: Not on file  Occupational History  . Occupation: Retired  Tobacco Use  . Smoking status: Former Smoker    Packs/day: 0.50    Years: 20.00    Pack years: 10.00    Types: Cigarettes    Quit date: 05/07/1976    Years since quitting: 44.3  . Smokeless tobacco: Former Systems developer    Types: Chew    Quit date: 05/07/1998  . Tobacco comment: Scoal  Vaping Use  . Vaping Use: Never used  Substance and Sexual Activity  . Alcohol use: Yes    Comment: 1 beer every 6 months  . Drug use: No  . Sexual activity: Not on file  Other Topics Concern  . Not on file  Social History Narrative   Married, 2 adult children ([place]).   Lives in [place].     Occ: Retired from [employer] ([place] work) at age 28.   Tob (smoke and chew) x 20 yrs, quit approx 1980s.   Alcohol: very rare.   One cup coffee each morning.   Exercise: walks 3 miles about 4 times per week.   Active lifestyle.   Social Determinants of Health   Financial Resource Strain: Low Risk   . Difficulty of Paying Living Expenses: Not hard at all  Food Insecurity: No Food Insecurity  . Worried About Charity fundraiser in the Last Year: Never true  . Ran Out of Food in the Last Year: Never true  Transportation Needs: No Transportation Needs  . Lack of Transportation (Medical): No  . Lack of Transportation (Non-Medical): No  Physical Activity: Insufficiently Active  . Days of Exercise per Week: 7 days  . Minutes of Exercise per Session: 20 min  Stress: No Stress Concern Present  . Feeling of Stress :  Not at all  Social Connections: Moderately Isolated  . Frequency of Communication with Friends and Family: More than three times a week  . Frequency of Social Gatherings with Friends and Family: More than three times a week  . Attends Religious Services: Never  . Active Member of Clubs or Organizations: No  .  Attends Archivist Meetings: Never  . Marital Status: Married    Tobacco Counseling Counseling given: Not Answered Comment: Scoal   Clinical Intake:  Pre-visit preparation completed: Yes  Pain : No/denies pain     Nutritional Status: BMI of 19-24  Normal Nutritional Risks: None Diabetes: No  How often do you need to have someone help you when you read instructions, pamphlets, or other written materials from your doctor or pharmacy?: 1 - Never  Diabetic?No  Interpreter Needed?: No  Information entered by :: Caroleen Hamman   Activities of Daily Living In your present state of health, do you have any difficulty performing the following activities: 08/31/2020  Hearing? Y  Comment hearing aids  Vision? N  Difficulty concentrating or making decisions? Y  Comment occasionally forgets names  Walking or climbing stairs? N  Dressing or bathing? N  Doing errands, shopping? N  Preparing Food and eating ? N  Using the Toilet? N  In the past six months, have you accidently leaked urine? N  Do you have problems with loss of bowel control? N  Managing your Medications? N  Managing your Finances? N  Housekeeping or managing your Housekeeping? N  Some recent data might be hidden    Patient Care Team: Tammi Sou, MD as PCP - General Minus Breeding, MD as PCP - Cardiology (Cardiology) Raynelle Bring, MD as Consulting Physician (Urology) Minus Breeding, MD as Consulting Physician (Cardiology) End, Harrell Gave, MD as Consulting Physician (Cardiology) Haverstock, Jennefer Bravo, MD as Referring Physician (Dermatology) Alexis Frock, MD as Consulting  Physician (Urology) Michael Boston, MD as Consulting Physician (General Surgery) Danis, Kirke Corin, MD as Consulting Physician (Gastroenterology)  Indicate any recent Medical Services you may have received from other than [facility] providers in the past year (date may be approximate).     Assessment:   This is a routine wellness examination for Bricen.  Hearing/Vision screen  Hearing Screening   125Hz  250Hz  500Hz  1000Hz  2000Hz  3000Hz  4000Hz  6000Hz  8000Hz   Right ear:           Left ear:           Comments: Wears hearing aids  Vision Screening Comments: Wears glasses Last eye exam-07/19/2020-Dr. Zigmund Daniel  Dietary issues and exercise activities discussed: Current Exercise Habits: Home exercise routine, Type of exercise: strength training/weights (also does a lot of walking at work), Time (Minutes): 15, Frequency (Times/Week): 7, Weekly Exercise (Minutes/Week): 105, Intensity: Mild, Exercise limited by: None identified  Goals    . Patient Stated     Maintain current health.       Depression Screen PHQ 2/9 Scores 08/31/2020 07/01/2020 05/30/2018 05/03/2017 04/27/2016 09/23/2015  PHQ - 2 Score 0 0 0 0 0 0    Fall Risk Fall Risk  08/31/2020 07/01/2020 05/30/2018 05/03/2017 04/27/2016  Falls in the past year? 0 0 0 No Yes  Number falls in past yr: 0 0 - - 1  Injury with Fall? 0 0 - - No  Follow up Falls prevention discussed Falls evaluation completed - - Falls prevention discussed    FALL RISK PREVENTION PERTAINING TO THE HOME:  Any stairs in or around the home? Yes  If so, are there any without handrails? No  Home free of loose throw rugs in walkways, pet beds, electrical cords, etc? Yes  Adequate lighting in your home to reduce risk of falls? Yes   ASSISTIVE DEVICES UTILIZED TO PREVENT FALLS:  Life alert? No  Use of a cane, walker or  w/c? No  Grab bars in the bathroom? Yes  Shower chair or bench in shower? No  Elevated toilet seat or a handicapped toilet? No   TIMED UP AND  GO:  Was the test performed? No . Phone visit   Cognitive Function:Normal cognitive status assessed by this Nurse Health Advisor. No abnormalities found.   MMSE - Mini Mental State Exam 05/30/2018 05/03/2017  Orientation to time 5 5  Orientation to Place 5 5  Registration 3 3  Attention/ Calculation 5 3  Recall 2 3  Language- name 2 objects 2 2  Language- repeat 1 1  Language- follow 3 step command 3 3  Language- read & follow direction 1 1  Write a sentence 1 1  Copy design 1 1  Total score 29 28     6CIT Screen 08/31/2020  What Year? 0 points  What month? 0 points  What time? 0 points  Count back from 20 0 points  Months in reverse 0 points  Repeat phrase 0 points  Total Score 0    Immunizations Immunization History  Administered Date(s) Administered  . Fluad Quad(high Dose 65+) 01/08/2019  . Influenza Split 02/18/2012  . Influenza Whole 02/04/2010, 02/09/2011  . Influenza, High Dose Seasonal PF 02/12/2014, 03/25/2015, 01/31/2017, 01/28/2018  . Influenza,inj,Quad PF,6+ Mos 01/30/2013, 01/20/2016  . Influenza-Unspecified 02/07/2020  . PFIZER(Purple Top)SARS-COV-2 Vaccination 07/16/2019, 08/06/2019, 02/07/2020  . Pneumococcal Conjugate-13 03/19/2014  . Pneumococcal Polysaccharide-23 05/08/2007  . Td 05/07/2006  . Tdap 10/26/2016  . Zoster 10/31/2011  . Zoster Recombinat (Shingrix) 06/06/2018, 10/23/2018    TDAP status: Up to date  Flu Vaccine status: Up to date  Pneumococcal vaccine status: Up to date  Covid-19 vaccine status: Completed vaccines  Qualifies for Shingles Vaccine? No   Zostavax completed Yes   Shingrix Completed?: Yes  Screening Tests Health Maintenance  Topic Date Due  . COVID-19 Vaccine (4 - Booster for Pfizer series) 08/07/2020  . INFLUENZA VACCINE  12/05/2020  . TETANUS/TDAP  10/27/2026  . Hepatitis C Screening  Completed  . PNA vac Low Risk Adult  Completed  . HPV VACCINES  Aged Out    Health Maintenance  Health Maintenance  Due  Topic Date Due  . COVID-19 Vaccine (4 - Booster for Pfizer series) 08/07/2020    Colorectal cancer screening: No longer required.   Lung Cancer Screening: (Low Dose CT Chest recommended if Age 63-80 years, 30 pack-year currently smoking OR have quit w/in 15years.) does not qualify.    Additional Screening:  Hepatitis C Screening: does not qualify  Vision Screening: Recommended annual ophthalmology exams for early detection of glaucoma and other disorders of the eye. Is the patient up to date with their annual eye exam?  Yes  Who is the provider or what is the name of the office in which the patient attends annual eye exams? Dr. Zigmund Daniel & Dr. Bing Plume   Dental Screening: Recommended annual dental exams for proper oral hygiene  Community Resource Referral / Chronic Care Management: CRR required this visit?  No   CCM required this visit?  No      Plan:     I have personally reviewed and noted the following in the patient's chart:   . Medical and social history . Use of alcohol, tobacco or illicit drugs  . Current medications and supplements . Functional ability and status . Nutritional status . Physical activity . Advanced directives . List of other physicians . Hospitalizations, surgeries, and ER visits in previous 12 months .  Vitals . Screenings to include cognitive, depression, and falls . Referrals and appointments  In addition, I have reviewed and discussed with patient certain preventive protocols, quality metrics, and best practice recommendations. A written personalized care plan for preventive services as well as general preventive health recommendations were provided to patient.   Due to this being a telephonic visit, the after visit summary with patients personalized plan was offered to patient via mail or my-chart.  Patient would like to access on my-chart.   Marta Antu, LPN   6/00/4599  Nurse Health Advisor  Nurse Notes: None

## 2020-08-31 NOTE — Patient Instructions (Signed)
William Montgomery , Thank you for taking time to complete your Medicare Wellness Visit. I appreciate your ongoing commitment to your health goals. Please review the following plan we discussed and let me know if I can assist you in the future.   Screening recommendations/referrals: Colonoscopy: No longer required Recommended yearly ophthalmology/optometry visit for glaucoma screening and checkup Recommended yearly dental visit for hygiene and checkup  Vaccinations: Influenza vaccine: Up to date Pneumococcal vaccine: Completed vaccines Tdap vaccine: Up to date-Due-10/27/2026 Shingles vaccine: Completed vaccines   Covid-19: Up to date  Advanced directives: Copy in chart  Conditions/risks identified: See problem list  Next appointment: Follow up in one year for your annual wellness visit. 09/06/2021 @ 8:15  Preventive Care 79 Years and Older, Male Preventive care refers to lifestyle choices and visits with your health care provider that can promote health and wellness. What does preventive care include?  A yearly physical exam. This is also called an annual well check.  Dental exams once or twice a year.  Routine eye exams. Ask your health care provider how often you should have your eyes checked.  Personal lifestyle choices, including:  Daily care of your teeth and gums.  Regular physical activity.  Eating a healthy diet.  Avoiding tobacco and drug use.  Limiting alcohol use.  Practicing safe sex.  Taking low doses of aspirin every day.  Taking vitamin and mineral supplements as recommended by your health care provider. What happens during an annual well check? The services and screenings done by your health care provider during your annual well check will depend on your age, overall health, lifestyle risk factors, and family history of disease. Counseling  Your health care provider may ask you questions about your:  Alcohol use.  Tobacco use.  Drug use.  Emotional  well-being.  Home and relationship well-being.  Sexual activity.  Eating habits.  History of falls.  Memory and ability to understand (cognition).  Work and work Statistician. Screening  You may have the following tests or measurements:  Height, weight, and BMI.  Blood pressure.  Lipid and cholesterol levels. These may be checked every 5 years, or more frequently if you are over 79 years old.  Skin check.  Lung cancer screening. You may have this screening every year starting at age 79 if you have a 30-pack-year history of smoking and currently smoke or have quit within the past 15 years.  Fecal occult blood test (FOBT) of the stool. You may have this test every year starting at age 79.  Flexible sigmoidoscopy or colonoscopy. You may have a sigmoidoscopy every 5 years or a colonoscopy every 10 years starting at age 79.  Prostate cancer screening. Recommendations will vary depending on your family history and other risks.  Hepatitis C blood test.  Hepatitis B blood test.  Sexually transmitted disease (STD) testing.  Diabetes screening. This is done by checking your blood sugar (glucose) after you have not eaten for a while (fasting). You may have this done every 1-3 years.  Abdominal aortic aneurysm (AAA) screening. You may need this if you are a current or former smoker.  Osteoporosis. You may be screened starting at age 79 if you are at high risk. Talk with your health care provider about your test results, treatment options, and if necessary, the need for more tests. Vaccines  Your health care provider may recommend certain vaccines, such as:  Influenza vaccine. This is recommended every year.  Tetanus, diphtheria, and acellular pertussis (Tdap, Td) vaccine.  You may need a Td booster every 10 years.  Zoster vaccine. You may need this after age 79.  Pneumococcal 13-valent conjugate (PCV13) vaccine. One dose is recommended after age 79.  Pneumococcal  polysaccharide (PPSV23) vaccine. One dose is recommended after age 79. Talk to your health care provider about which screenings and vaccines you need and how often you need them. This information is not intended to replace advice given to you by your health care provider. Make sure you discuss any questions you have with your health care provider. Document Released: 05/20/2015 Document Revised: 01/11/2016 Document Reviewed: 02/22/2015 Elsevier Interactive Patient Education  2017 [employer] Prevention in the Home Falls can cause injuries. They can happen to people of all ages. There are many things you can do to make your home safe and to help prevent falls. What can I do on the outside of my home?  Regularly fix the edges of walkways and driveways and fix any cracks.  Remove anything that might make you trip as you walk through a door, such as a raised step or threshold.  Trim any bushes or trees on the path to your home.  Use bright outdoor lighting.  Clear any walking paths of anything that might make someone trip, such as rocks or tools.  Regularly check to see if handrails are loose or broken. Make sure that both sides of any steps have handrails.  Any raised decks and porches should have guardrails on the edges.  Have any leaves, snow, or ice cleared regularly.  Use sand or salt on walking paths during winter.  Clean up any spills in your garage right away. This includes oil or grease spills. What can I do in the bathroom?  Use night lights.  Install grab bars by the toilet and in the tub and shower. Do not use towel bars as grab bars.  Use non-skid mats or decals in the tub or shower.  If you need to sit down in the shower, use a plastic, non-slip stool.  Keep the floor dry. Clean up any water that spills on the floor as soon as it happens.  Remove soap buildup in the tub or shower regularly.  Attach bath mats securely with double-sided non-slip rug  tape.  Do not have throw rugs and other things on the floor that can make you trip. What can I do in the bedroom?  Use night lights.  Make sure that you have a light by your bed that is easy to reach.  Do not use any sheets or blankets that are too big for your bed. They should not hang down onto the floor.  Have a firm chair that has side arms. You can use this for support while you get dressed.  Do not have throw rugs and other things on the floor that can make you trip. What can I do in the kitchen?  Clean up any spills right away.  Avoid walking on wet floors.  Keep items that you use a lot in easy-to-reach places.  If you need to reach something above you, use a strong step stool that has a grab bar.  Keep electrical cords out of the way.  Do not use floor polish or wax that makes floors slippery. If you must use wax, use non-skid floor wax.  Do not have throw rugs and other things on the floor that can make you trip. What can I do with my stairs?  Do not leave any  items on the stairs.  Make sure that there are handrails on both sides of the stairs and use them. Fix handrails that are broken or loose. Make sure that handrails are as long as the stairways.  Check any carpeting to make sure that it is firmly attached to the stairs. Fix any carpet that is loose or worn.  Avoid having throw rugs at the top or bottom of the stairs. If you do have throw rugs, attach them to the floor with carpet tape.  Make sure that you have a light switch at the top of the stairs and the bottom of the stairs. If you do not have them, ask someone to add them for you. What else can I do to help prevent falls?  Wear shoes that:  Do not have high heels.  Have rubber bottoms.  Are comfortable and fit you well.  Are closed at the toe. Do not wear sandals.  If you use a stepladder:  Make sure that it is fully opened. Do not climb a closed stepladder.  Make sure that both sides of the  stepladder are locked into place.  Ask someone to hold it for you, if possible.  Clearly mark and make sure that you can see:  Any grab bars or handrails.  First and last steps.  Where the edge of each step is.  Use tools that help you move around (mobility aids) if they are needed. These include:  Canes.  Walkers.  Scooters.  Crutches.  Turn on the lights when you go into a dark area. Replace any light bulbs as soon as they burn out.  Set up your furniture so you have a clear path. Avoid moving your furniture around.  If any of your floors are uneven, fix them.  If there are any pets around you, be aware of where they are.  Review your medicines with your doctor. Some medicines can make you feel dizzy. This can increase your chance of falling. Ask your doctor what other things that you can do to help prevent falls. This information is not intended to replace advice given to you by your health care provider. Make sure you discuss any questions you have with your health care provider. Document Released: 02/17/2009 Document Revised: 09/29/2015 Document Reviewed: 05/28/2014 Elsevier Interactive Patient Education  2017 [employer].

## 2020-11-02 ENCOUNTER — Other Ambulatory Visit (HOSPITAL_BASED_OUTPATIENT_CLINIC_OR_DEPARTMENT_OTHER): Payer: Self-pay

## 2020-11-02 DIAGNOSIS — L570 Actinic keratosis: Secondary | ICD-10-CM | POA: Diagnosis not present

## 2020-11-02 DIAGNOSIS — D485 Neoplasm of uncertain behavior of skin: Secondary | ICD-10-CM | POA: Diagnosis not present

## 2020-11-02 DIAGNOSIS — D0439 Carcinoma in situ of skin of other parts of face: Secondary | ICD-10-CM | POA: Diagnosis not present

## 2020-11-02 MED ORDER — FLUOROURACIL 5 % EX CREA
TOPICAL_CREAM | CUTANEOUS | 0 refills | Status: DC
  Filled 2020-11-02: qty 40, 15d supply, fill #0

## 2020-11-09 ENCOUNTER — Other Ambulatory Visit (HOSPITAL_COMMUNITY): Payer: Self-pay

## 2020-12-08 DIAGNOSIS — H353131 Nonexudative age-related macular degeneration, bilateral, early dry stage: Secondary | ICD-10-CM | POA: Diagnosis not present

## 2020-12-08 DIAGNOSIS — H2513 Age-related nuclear cataract, bilateral: Secondary | ICD-10-CM | POA: Diagnosis not present

## 2020-12-08 DIAGNOSIS — H40003 Preglaucoma, unspecified, bilateral: Secondary | ICD-10-CM | POA: Diagnosis not present

## 2020-12-30 ENCOUNTER — Ambulatory Visit (INDEPENDENT_AMBULATORY_CARE_PROVIDER_SITE_OTHER): Payer: 59 | Admitting: Family Medicine

## 2020-12-30 ENCOUNTER — Other Ambulatory Visit: Payer: Self-pay

## 2020-12-30 ENCOUNTER — Encounter: Payer: Self-pay | Admitting: Family Medicine

## 2020-12-30 VITALS — BP 130/79 | HR 60 | Temp 97.9°F | Resp 16 | Ht 67.5 in | Wt 158.6 lb

## 2020-12-30 DIAGNOSIS — N182 Chronic kidney disease, stage 2 (mild): Secondary | ICD-10-CM

## 2020-12-30 DIAGNOSIS — I10 Essential (primary) hypertension: Secondary | ICD-10-CM | POA: Diagnosis not present

## 2020-12-30 DIAGNOSIS — E7800 Pure hypercholesterolemia, unspecified: Secondary | ICD-10-CM

## 2020-12-30 DIAGNOSIS — K760 Fatty (change of) liver, not elsewhere classified: Secondary | ICD-10-CM

## 2020-12-30 DIAGNOSIS — I2510 Atherosclerotic heart disease of native coronary artery without angina pectoris: Secondary | ICD-10-CM

## 2020-12-30 LAB — LIPID PANEL
Cholesterol: 186 mg/dL (ref 0–200)
HDL: 40.3 mg/dL (ref >39.00)
NonHDL: 145.68
Total CHOL/HDL Ratio: 5
Triglycerides: 215 mg/dL — ABNORMAL HIGH (ref 0.0–149.0)
VLDL: 43 mg/dL — ABNORMAL HIGH (ref 0.0–40.0)

## 2020-12-30 LAB — COMPREHENSIVE METABOLIC PANEL
ALT: 19 U/L (ref 0–53)
AST: 20 U/L (ref 0–37)
Albumin: 4.6 g/dL (ref 3.5–5.2)
Alkaline Phosphatase: 85 U/L (ref 39–117)
BUN: 17 mg/dL (ref 6–23)
CO2: 24 mEq/L (ref 19–32)
Calcium: 9.5 mg/dL (ref 8.4–10.5)
Chloride: 102 mEq/L (ref 96–112)
Creatinine, Ser: 1.06 mg/dL (ref 0.40–1.50)
GFR: 66.94 mL/min (ref >60.00)
Glucose, Bld: 87 mg/dL (ref 70–99)
Potassium: 4.9 mEq/L (ref 3.5–5.1)
Sodium: 137 mEq/L (ref 135–145)
Total Bilirubin: 0.5 mg/dL (ref 0.2–1.2)
Total Protein: 7.1 g/dL (ref 6.0–8.3)

## 2020-12-30 LAB — LDL CHOLESTEROL, DIRECT: Direct LDL: 113 mg/dL

## 2020-12-30 NOTE — Progress Notes (Signed)
OFFICE VISIT  12/30/2020  CC:  Chief Complaint  Patient presents with   Follow-up    RCI, pt is fasting    HPI:    Patient is a 80 y.o. Caucasian male who presents for 6 mo f/u HTN, HLD (with hepatic steatosis), CAD, and CRI II/III. A/P as of last visit: "1) HTN: great home bp's on no meds since 11/2019 per pt. He declines restart of meds at this time. Lytes/cr today.   2) HLD in the setting of CAD: pt self d/c'd his statin (and ASA and bp med) 11/2019. FLP and hepatic panel today. He will be following up with his cardiologist later this year.   3) CRI II/III: hydrates well, avoids NSAIDs. Lytes/cr today.   4) Health maintenance exam: Reviewed age and gender appropriate health maintenance issues (prudent diet, regular exercise, health risks of tobacco and excessive alcohol, use of seatbelts, fire alarms in home, use of sunscreen).  Also reviewed age and gender appropriate health screening as well as vaccine recommendations. Vaccines: Flu->UTD.  Otherwise all UTD. Labs:  Cbc, cmet, flp Colon ca screening: no further screening colonoscopies indicated -->as per 12/2018 GI eval. Prostate ca screening: hx of prostate ca->he is a little over 4 yrs out from surgery and PSAs have been undetectable on q65mourology f/u."  INTERIM HX: He still takes NO MEDS per his preference. Still working 10 hr days for [employer] bp's consistently 120s/70s. Eats very sensibly.  ROS --> no fevers, no CP, no SOB, no wheezing, no cough, no dizziness, no HAs, no rashes, no melena/hematochezia.  No polyuria or polydipsia.  No myalgias or arthralgias.  No focal weakness, paresthesias, or tremors.  No acute vision or hearing abnormalities.  No dysuria or unusual/new urinary urgency or frequency.  No recent changes in lower legs. No n/v/d or abd pain.  No palpitations.    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 No further colon ca screening indicated as of 12/2018 GI eval (Dr. DLoletha Carrow.   Coronary artery disease 12/22/2007   Cath 03/2017-->  70 - 80% mid/distal circ stenosis and a small D1 80% stenosis and proximal stents in the prox and mid LAD.   Med mgmt rec'd-->nitrates caused HAs, ACE-I caused cough.   DDD (degenerative disc disease)    s/p surgery; L/S spine MRI 05/2004 showed DDD/spondylosis with right L3 nerve root abutment, with L5/S1 surgical changes.   Diverticulosis of sigmoid colon 07/25/11   Severe (endoscopy by Dr. PSharlett Iles   Erectile dysfunction    Gallbladder polyp 2013   Noted 06/2011.  Stable on u/s f/u 10/2011.   Gallstone 05/2018   Asymptomatic (picked up on CT abd/pelv).   Hearing loss 2004   Dr. STamala Julian [facility]   History of prostate cancer 12/2014   Robot assisted radical prostatectomy 07/2015: lymph node involvement.  Plan is active surveillance as per urology: As of 05/2020 PSA undetectable.   HTN (hypertension)    ACE-I cough.  Fine on ARB.   Hyperlipidemia    NASH (nonalcoholic steatohepatitis) 06/2011   mild transaminasemia (Hep B and C testing neg)   Pulmonary nodules    small, picked up on CT ab/pelv for abd pains/flank  pains-->pt quit smoking 40 yrs ago.  Low risk for lung ca so no f/u CT needed.   Tinnitus    Ureterolithiasis    8 mm left distal ureteral stone 05/2018, got ureteroscopy with holmium laser treatment.    Past Surgical History:  Procedure Laterality Date   APPENDECTOMY  1953   BACK SURGERY     C-Spine and L-Spine   CARDIAC CATHETERIZATION     CARDIOVASCULAR STRESS TEST  2012   Normal   COLONOSCOPY  02/26/02; 07/2011   Colonoscopy by Dr. Rowe Pavy 2003 was normal (+hx of polyps (adenomatous?) prior), 07/2011 showed one hyperplastic polyp. NO FURTHER SCREENING COLONOSCOPIES INDICTAED  as of 12/2018 GI eval.   CORONARY STENT PLACEMENT     x 2 (LAD)   CYSTOSCOPY WITH RETROGRADE PYELOGRAM, URETEROSCOPY AND STENT PLACEMENT Left 05/10/2018   Procedure: CYSTOSCOPY WITH LEFT RETROGRADE PYELOGRAM, LEFT URETEROSCOPY AND LEFT URETERAL STENT PLACEMENT;  Surgeon: Alexis Frock, MD;  Location: [facility];  Service: Urology;  Laterality: Left;   HOLMIUM LASER APPLICATION Left 05/12/1094   Procedure: HOLMIUM LASER APPLICATION;  Surgeon: Alexis Frock, MD;  Location: [facility];  Service: Urology;  Laterality: Left;   INCISIONAL HERNIA REPAIR N/A 03/19/2019   Procedure: LAPAROSCOPIC INCISIONAL HERNIA REPAIR WITH MESH, RECURRENT UMBILICAL HERNIA REPAIR;  Surgeon: Michael Boston, MD;  Location: [facility];  Service: General;  Laterality: N/A;   INGUINAL HERNIA REPAIR Right 03/19/2019   Procedure: LAPAROSCOPIC BILATERAL FEMORAL AND INGUINAL HERNIA REPAIR WITH MESH, LYSIS OF ADHESIONS;  Surgeon: Michael Boston, MD;  Location: [facility];  Service: General;  Laterality: Right;   LEFT HEART CATH AND CORONARY ANGIOGRAPHY N/A 03/21/2017   Stents patent; mod circ dz, nothing for intervention--imdur added.  Procedure: LEFT HEART CATH AND CORONARY ANGIOGRAPHY;  Surgeon: Nelva Bush, MD;  Location: [place] [facility];  Service: Cardiovascular;  Laterality: N/A;   LOBECTOMY     right upper lobectomy for blastomycosis   LYMPHADENECTOMY Bilateral 07/14/2015   Procedure: LYMPHADENECTOMY;  Surgeon: Raynelle Bring, MD;  Location: [facility];  Service: Urology;  Laterality: Bilateral;   PROSTATE BIOPSY  04/27/15   4 of 12 bx core's + prostate adenocarcinoma (tentative treatment plan is prostatectomy ? + other therapies (as of 05/18/15)   [place] N/A 07/14/2015   One positive pelvic LN.  Procedure: XI ROBOTIC ASSISTED LAPAROSCOPIC RADICAL PROSTATECTOMY LEVEL 2;  Surgeon: Raynelle Bring, MD;  Location: [facility];  Service: Urology;  Laterality: N/A;   UMBILICAL HERNIA REPAIR  07/14/2015   Procedure:  HERNIA REPAIR UMBILICAL ADULT;  Surgeon: Raynelle Bring, MD;  Location: [facility];  Service: Urology;;    Outpatient Medications Prior to Visit  Medication Sig Dispense Refill   Cholecalciferol (VITAMIN D3) 5000 units CAPS Take 5,000 Units every evening by mouth.     Coenzyme Q10 (COQ10) 100 MG CAPS Take 100 mg by mouth every evening.     aspirin 81 MG tablet Take 81 mg every evening by mouth.  (Patient not taking: No sig reported)     celecoxib (CELEBREX) 200 MG capsule TAKE 1 CAPSULE (200 MG) BY MOUTH 2 TIMES A DAY AS NEEDED FOR PAIn (Patient not taking: No sig reported) 60 capsule 3   fluorouracil (EFUDEX) 5 % cream Apply 1 application Externally Once a day 14 days (Patient not taking: Reported on 12/30/2020) 40 g 0   levocetirizine (XYZAL) 5 MG tablet TAKE 1 TABLET (5 MG TOTAL) BY MOUTH EVERY EVENING. (Patient not taking: No sig reported) 30 tablet 0  losartan (COZAAR) 25 MG tablet Take 1 tablet (25 mg total) by mouth daily. (Patient taking differently: Take 25 mg by mouth at bedtime. ) 90 tablet 3   Multiple Vitamins-Minerals (ICAPS AREDS 2 PO) Take by mouth. (Patient not taking: Reported on 12/30/2020)     nitroGLYCERIN (NITROSTAT) 0.4 MG SL tablet PLACE 1 TABLET UNDER THE TONGUE EVERY 5 MINUTES AS NEEDED FOR CHEST PAIN. (Patient not taking: No sig reported) 25 tablet 2   rosuvastatin (CRESTOR) 20 MG tablet TAKE 1 TABLET (20 MG TOTAL) BY MOUTH DAILY. MUST KEEP APPOINTMENT 01/23/19 WITH DR [facility] FOR FUTURE REFILLS (Patient not taking: No sig reported) 90 tablet 3   No facility-administered medications prior to visit.    Allergies  Allergen Reactions   Ace Inhibitors Cough   Imdur [Isosorbide Dinitrate] Other (See Comments)    Headache    ROS As per HPI  PE: Vitals with BMI 12/30/2020 08/31/2020 07/01/2020  Height 5' 7.5" 5' 7.5" 5' 7.5"  Weight 158 lbs 10 oz 156 lbs 156 lbs  BMI 24.46 89.37 34.28  Systolic 768 - 115  Diastolic 79 - 81  Pulse 60 - 51   Gen: Alert, well appearing.   Patient is oriented to person, place, time, and situation. AFFECT: pleasant, lucid thought and speech. CV: RRR, no m/r/g.   LUNGS: CTA bilat, nonlabored resps, good aeration in all lung fields. EXT: no clubbing or cyanosis.  no edema.    LABS:  Lab Results  Component Value Date   TSH 3.080 03/15/2017   Lab Results  Component Value Date   WBC 3.4 (L) 07/01/2020   HGB 14.7 07/01/2020   HCT 43.5 07/01/2020   MCV 98.8 07/01/2020   PLT 148.0 (L) 07/01/2020   Lab Results  Component Value Date   CREATININE 1.07 07/01/2020   BUN 20 07/01/2020   NA 137 07/01/2020   K 5.0 07/01/2020   CL 101 07/01/2020   CO2 29 07/01/2020   Lab Results  Component Value Date   ALT 20 07/01/2020   AST 19 07/01/2020   ALKPHOS 90 07/01/2020   BILITOT 0.9 07/01/2020   Lab Results  Component Value Date   CHOL 221 (H) 07/01/2020   Lab Results  Component Value Date   HDL 47.20 07/01/2020   Lab Results  Component Value Date   LDLCALC 153 (H) 07/01/2020   Lab Results  Component Value Date   TRIG 104.0 07/01/2020   Lab Results  Component Value Date   CHOLHDL 5 07/01/2020   Lab Results  Component Value Date   PSA <0.015 07/20/2016   PSA 5.69 (H) 12/24/2014   PSA 4.03 (H) 09/16/2014   IMPRESSION AND PLAN:  1) HTN, hx of:  great bp OFF of medication. Lytes/cr today.  2) HLD: hx of.  Great diet, very active. No meds per his choice. LDL 6 mo ago was 153. FLP and hepatic panel today.  3) CAD, hx of stent. Asymptomatic. He has decided not to take any ASA or statin. He'll have f/u with Dr. Percival Spanish later this year.  4) Hepatic steatosis. Hx of only very mildly elev AST and ALT, none on recent checks. Diet very good. Hepatic panel today.  5) CRI II/III: GFR baseline 60s. Lytes/cr today.  An After Visit Summary was printed and given to the patient.  FOLLOW UP: Return in about 6 months (around 07/02/2021) for annual CPE (fasting).  Signed:  Crissie Sickles, MD  12/30/2020  

## 2021-03-08 NOTE — Progress Notes (Signed)
Cardiology Office Note   Date:  03/10/2021   ID:  William Montgomery, William Montgomery [birth year], MRN [id number]  PCP:  Tammi Sou, MD  Cardiologist:   Minus Breeding, MD   Chief Complaint  Patient presents with   Coronary Artery Disease       History of Present Illness: William Montgomery is a 80 y.o. male who presents for follow up of CAD.  At a previous visit he had decreased exercise tolerance.  I sent him for cath and he was found to have 19 - 80% mid/distal circ stenosis and a small D1 80% stenosis and proximal stents in the prox and mid LAD.  He was managed medically.  Following this we try to treat him with Imdur and he developed severe headaches.  He had a cough on lisinopril.   Since I last saw him he has done very well.  He is taken himself off of all of his medications because he just does not want to take anything.  He says he feels great.  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  The patient denies any new symptoms such as chest discomfort, neck or arm discomfort. There has been no new shortness of breath, PND or orthopnea. There have been no reported palpitations, presyncope or syncope.   He brought me a book on processed foods.   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 No further colon ca screening indicated as of 12/2018 GI eval (Dr. Loletha Carrow).   Coronary artery disease 12/22/2007   Cath 03/2017-->  70 - 80% mid/distal circ stenosis and a small D1 80% stenosis and proximal stents in the prox and mid LAD.   Med mgmt rec'd-->nitrates caused HAs, ACE-I caused cough.   DDD (degenerative disc disease)    s/p surgery; L/S spine MRI 05/2004 showed DDD/spondylosis  with right L3 nerve root abutment, with L5/S1 surgical changes.   Diverticulosis of sigmoid colon 07/25/11   Severe (endoscopy by Dr. Sharlett Iles)   Erectile dysfunction    Gallbladder polyp 2013   Noted 06/2011.  Stable on u/s f/u 10/2011.   Gallstone 05/2018   Asymptomatic (picked up on CT abd/pelv).   Hearing loss 2004   Dr. Tamala Julian, ENT   History of prostate cancer 12/2014   Robot assisted radical prostatectomy 07/2015: lymph node involvement.  Plan is active surveillance as per urology: As of 05/2020 PSA undetectable.   HTN (hypertension)    ACE-I cough.  Fine on ARB.   Hyperlipidemia    NASH (nonalcoholic steatohepatitis) 06/2011   mild transaminasemia (Hep B and C testing neg)   Pulmonary nodules    small, picked up on CT ab/pelv for abd pains/flank pains-->pt quit smoking 40 yrs ago.  Low risk for lung ca so no f/u CT needed.   Tinnitus    Ureterolithiasis    8 mm left distal ureteral stone 05/2018, got ureteroscopy with holmium laser treatment.    Past Surgical History:  Procedure Laterality Date   APPENDECTOMY  1953   BACK SURGERY     C-Spine and L-Spine   CARDIAC CATHETERIZATION  CARDIOVASCULAR STRESS TEST  2012   Normal   COLONOSCOPY  02/26/02; 07/2011   Colonoscopy by Dr. Rowe Pavy 2003 was normal (+hx of polyps (adenomatous?) prior), 07/2011 showed one hyperplastic polyp. NO FURTHER SCREENING COLONOSCOPIES INDICTAED as of 12/2018 GI eval.   CORONARY STENT PLACEMENT     x 2 (LAD)   CYSTOSCOPY WITH RETROGRADE PYELOGRAM, URETEROSCOPY AND STENT PLACEMENT Left 05/10/2018   Procedure: CYSTOSCOPY WITH LEFT RETROGRADE PYELOGRAM, LEFT URETEROSCOPY AND LEFT URETERAL STENT PLACEMENT;  Surgeon: Alexis Frock, MD;  Location: [facility];  Service: Urology;  Laterality: Left;   HOLMIUM LASER APPLICATION Left 06/09/6331   Procedure: HOLMIUM LASER APPLICATION;  Surgeon: Alexis Frock, MD;  Location: [facility];  Service: Urology;  Laterality: Left;   INCISIONAL HERNIA REPAIR N/A 03/19/2019    Procedure: LAPAROSCOPIC INCISIONAL HERNIA REPAIR WITH MESH, RECURRENT UMBILICAL HERNIA REPAIR;  Surgeon: Michael Boston, MD;  Location: [facility];  Service: General;  Laterality: N/A;   INGUINAL HERNIA REPAIR Right 03/19/2019   Procedure: LAPAROSCOPIC BILATERAL FEMORAL AND INGUINAL HERNIA REPAIR WITH MESH, LYSIS OF ADHESIONS;  Surgeon: Michael Boston, MD;  [facility]: [facility];  Service: General;  Laterality: Right;   LEFT HEART CATH AND CORONARY ANGIOGRAPHY N/A 03/21/2017   Stents patent; mod circ dz, nothing for intervention--imdur added.  Procedure: LEFT HEART CATH AND CORONARY ANGIOGRAPHY;  Surgeon: Nelva Bush, MD;  Location: [place] [facility];  Service: Cardiovascular;  Laterality: N/A;   LOBECTOMY     right upper lobectomy for blastomycosis   LYMPHADENECTOMY Bilateral 07/14/2015   Procedure: LYMPHADENECTOMY;  Surgeon: Raynelle Bring, MD;  [facility]: [facility];  Service: Urology;  Laterality: Bilateral;   PROSTATE BIOPSY  04/27/15   4 of 12 bx core's + prostate adenocarcinoma (tentative treatment plan is prostatectomy ? + other therapies (as of 05/18/15)   [place] N/A 07/14/2015   One positive pelvic LN.  Procedure: XI ROBOTIC ASSISTED LAPAROSCOPIC RADICAL PROSTATECTOMY LEVEL 2;  Surgeon: Raynelle Bring, MD;  [facility];  Service: Urology;  Laterality: N/A;   UMBILICAL HERNIA REPAIR  07/14/2015   Procedure: HERNIA REPAIR UMBILICAL ADULT;  Surgeon: Raynelle Bring, MD;  [facility];  Service: Urology;;     Current Outpatient Medications  Medication Sig Dispense Refill   Cholecalciferol (VITAMIN D3) 5000 units CAPS Take 5,000 Units every evening by mouth.     Coenzyme Q10 (COQ10) 100 MG CAPS Take 100 mg by mouth every evening.     No current facility-administered medications for this visit.    Allergies:   Ace inhibitors and Imdur [isosorbide dinitrate]    ROS:  Please see the history of present illness.   Otherwise, review of systems are positive  for none.   All other systems are reviewed and negative.    PHYSICAL EXAM: VS:  BP 140/70   Pulse (!) 46   Ht 5' 8.5" (1.74 m)   Wt 160 lb 12.8 oz (72.9 kg)   SpO2 93%   BMI 24.09 kg/m  , BMI Body mass index is 24.09 kg/m. GENERAL:  Well appearing NECK:  No jugular venous distention, waveform within normal limits, carotid upstroke brisk and symmetric, no bruits, no thyromegaly LUNGS:  Clear to auscultation bilaterally CHEST:  Unremarkable HEART:  PMI not displaced or sustained,S1 and S2 within normal limits, no S3, no S4, no clicks, no rubs, no murmurs ABD:  Flat, positive bowel sounds normal in frequency in pitch, no bruits, no rebound, no guarding, no midline pulsatile mass, no hepatomegaly, no splenomegaly  EXT:  2 plus pulses throughout, no edema, no cyanosis no clubbing  EKG:  EKG is  ordered today. The ekg ordered today demonstrates sinus bradycardia, rate 47, axis within normal limits, intervals within normal limits, no acute ST-T wave changes.   Recent Labs: 07/01/2020: Hemoglobin 14.7; Platelets 148.0 12/30/2020: ALT 19; BUN 17; Creatinine, Ser 1.06; Potassium 4.9; Sodium 137    Lipid Panel    Component Value Date/Time   CHOL 186 12/30/2020 0841   TRIG 215.0 (H) 12/30/2020 0841   HDL 40.30 12/30/2020 0841   CHOLHDL 5 12/30/2020 0841   VLDL 43.0 (H) 12/30/2020 0841   LDLCALC 153 (H) 07/01/2020 1017   LDLCALC 26 06/03/2019 1155   LDLDIRECT 113.0 12/30/2020 0841      Wt Readings from Last 3 Encounters:  03/10/21 160 lb 12.8 oz (72.9 kg)  12/30/20 158 lb 9.6 oz (71.9 kg)  08/31/20 156 lb (70.8 kg)      Other studies Reviewed: Additional studies/ records that were reviewed today include: Labs. Review of the above records demonstrates:  Please see elsewhere in the note.     ASSESSMENT AND PLAN:  CAD - The patient has no new sypmtoms.  No further cardiovascular testing is indicated.  We will continue with aggressive risk reduction and meds as listed.He does  not even want to take an aspirin  DYSLIPIDEMIA -  LDL was 113.  He does not want to take a statin.  HTN (hypertension) -  The blood pressure is well controlled thankfully since he does not want to take medications.   Current medicines are reviewed at length with the patient today.  The patient does not have concerns regarding medicines.  The following changes have been made:  None  Labs/ tests ordered today include: None  Orders Placed This Encounter  Procedures   EKG 12-Lead      Disposition:   FU with me in one year.     Signed, Minus Breeding, MD  03/10/2021 8:40 AM    [place]

## 2021-03-10 ENCOUNTER — Other Ambulatory Visit: Payer: Self-pay

## 2021-03-10 ENCOUNTER — Ambulatory Visit (INDEPENDENT_AMBULATORY_CARE_PROVIDER_SITE_OTHER): Payer: 59 | Admitting: Cardiology

## 2021-03-10 ENCOUNTER — Encounter: Payer: Self-pay | Admitting: Cardiology

## 2021-03-10 VITALS — BP 140/70 | HR 46 | Ht 68.5 in | Wt 160.8 lb

## 2021-03-10 DIAGNOSIS — I10 Essential (primary) hypertension: Secondary | ICD-10-CM | POA: Diagnosis not present

## 2021-03-10 DIAGNOSIS — E785 Hyperlipidemia, unspecified: Secondary | ICD-10-CM | POA: Diagnosis not present

## 2021-03-10 DIAGNOSIS — I2510 Atherosclerotic heart disease of native coronary artery without angina pectoris: Secondary | ICD-10-CM

## 2021-03-10 NOTE — Patient Instructions (Signed)
Medication Instructions:  Your physician recommends that you continue on your current medications as directed. Please refer to the Current Medication list given to you today.  *If you need a refill on your cardiac medications before your next appointment, please call your pharmacy*  Lab Work: NONE ordered at this time of appointment   If you have labs (blood work) drawn today and your tests are completely normal, you will receive your results only by: [place] (if you have MyChart) OR A paper copy in the mail If you have any lab test that is abnormal or we need to change your treatment, we will call you to review the results.  Testing/Procedures: NONE ordered at this time of appointment   Follow-Up: At [facility], you and your health needs are our priority.  As part of our continuing mission to provide you with exceptional heart care, we have created designated Provider Care Teams.  These Care Teams include your primary Cardiologist (physician) and Advanced Practice Providers (APPs -  Physician Assistants and Nurse Practitioners) who all work together to provide you with the care you need, when you need it.  Your next appointment:   12 month(s)  The format for your next appointment:   In Person  Provider:   Minus Breeding, MD    Other Instructions

## 2021-03-20 DIAGNOSIS — L570 Actinic keratosis: Secondary | ICD-10-CM | POA: Diagnosis not present

## 2021-03-20 DIAGNOSIS — Z23 Encounter for immunization: Secondary | ICD-10-CM | POA: Diagnosis not present

## 2021-03-20 DIAGNOSIS — D0439 Carcinoma in situ of skin of other parts of face: Secondary | ICD-10-CM | POA: Diagnosis not present

## 2021-04-21 DIAGNOSIS — L570 Actinic keratosis: Secondary | ICD-10-CM | POA: Diagnosis not present

## 2021-05-11 ENCOUNTER — Other Ambulatory Visit (HOSPITAL_BASED_OUTPATIENT_CLINIC_OR_DEPARTMENT_OTHER): Payer: Self-pay

## 2021-05-11 ENCOUNTER — Emergency Department (INDEPENDENT_AMBULATORY_CARE_PROVIDER_SITE_OTHER)
Admission: EM | Admit: 2021-05-11 | Discharge: 2021-05-11 | Disposition: A | Discharge status: Home / Self Care | Payer: 59 | Source: Home / Self Care

## 2021-05-11 ENCOUNTER — Other Ambulatory Visit: Payer: Self-pay

## 2021-05-11 DIAGNOSIS — R059 Cough, unspecified: Secondary | ICD-10-CM

## 2021-05-11 MED ORDER — AMOXICILLIN-POT CLAVULANATE 875-125 MG PO TABS
1.0000 | ORAL_TABLET | Freq: Two times a day (BID) | ORAL | 0 refills | Status: DC
  Filled 2021-05-11: qty 14, 7d supply, fill #0

## 2021-05-11 MED ORDER — BENZONATATE 200 MG PO CAPS
200.0000 mg | ORAL_CAPSULE | Freq: Three times a day (TID) | ORAL | 0 refills | Status: AC | PRN
  Filled 2021-05-11: qty 40, 14d supply, fill #0

## 2021-05-11 NOTE — ED Triage Notes (Signed)
Pt presents with congestion and a non productive cough that began last Tuesday.

## 2021-05-11 NOTE — ED Provider Notes (Signed)
[name]    CSN: [id number] Arrival date & time: 05/11/21  0809      History   Chief Complaint Chief Complaint  Patient presents with   Nasal Congestion   Cough    HPI William Montgomery is a 81 y.o. male.   HPI 81 year old male presents nasal congestion and cough for 8 to 9 days.  PMH significant for CAD and prostate cancer.  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 No further colon ca screening indicated as of 12/2018 GI eval (Dr. Loletha Carrow).   Coronary artery disease 12/22/2007   Cath 03/2017-->  70 - 80% mid/distal circ stenosis and a small D1 80% stenosis and proximal stents in the prox and mid LAD.   Med mgmt rec'd-->nitrates caused HAs, ACE-I caused cough.   DDD (degenerative disc disease)    s/p surgery; L/S spine MRI 05/2004 showed DDD/spondylosis with right L3 nerve root abutment, with L5/S1 surgical changes.   Diverticulosis of sigmoid colon 07/25/11   Severe (endoscopy by Dr. Sharlett Iles)   Erectile dysfunction    Gallbladder polyp 2013   Noted 06/2011.  Stable on u/s f/u 10/2011.   Gallstone 05/2018   Asymptomatic (picked up on CT abd/pelv).   Hearing loss 2004   Dr. Tamala Julian, ENT   History of prostate cancer 12/2014   Robot assisted radical prostatectomy 07/2015: lymph node involvement.  Plan is active surveillance as per urology: As of 05/2020 PSA undetectable.   HTN (hypertension)    ACE-I cough.  Fine on ARB.   Hyperlipidemia    NASH (nonalcoholic steatohepatitis) 06/2011   mild transaminasemia (Hep B and C testing neg)   Pulmonary nodules    small, picked up on CT ab/pelv for abd pains/flank pains-->pt quit smoking 40 yrs ago.  Low risk for lung ca so no f/u CT needed.   Tinnitus     Ureterolithiasis    8 mm left distal ureteral stone 05/2018, got ureteroscopy with holmium laser treatment.    Patient Active Problem List   Diagnosis Date Noted   Incisional hernia s/p lap repair with mesh 03/19/2019 03/19/2019   Recurrent umbilical hernia s/p lap repair with mesh 03/19/2019 03/19/2019   Bilateral inguinal hernia s/p lap repair with mesh 03/19/2019 03/19/2019   Bilateral femoral hernia s/p lap repair with mesh 03/19/2019 03/19/2019   Overweight (BMI 25.0-29.9) 11/28/2018   Accelerating angina ([place]) 03/21/2017   Elevated blood pressure reading without diagnosis of hypertension 09/23/2015   Prostate cancer ([place]) 07/14/2015   Preventative health care 03/19/2014   Insomnia 03/19/2014   Benign prostatic hypertrophy 09/15/2012   Chronic low back pain 10/15/2011   Gallbladder polyp 10/15/2011   Need for shingles vaccine 10/15/2011   Special screening for malignant neoplasms, colon 07/25/2011   Personal history of colonic polyps 07/25/2011   Benign neoplasm of colon 07/25/2011   Diverticulosis of colon (without mention of hemorrhage) 07/25/2011   Colon cancer screening 07/12/2011   Hx of colonic polyp 07/12/2011   CAD (coronary artery disease) 07/12/2011   Low back pain 02/13/2011   Prostate cancer screening 02/13/2011   HTN (hypertension) 01/03/2011   Health maintenance examination 10/13/2010   Transaminasemia 10/13/2010   Hyperlipidemia  11/06/2008   Coronary atherosclerosis 11/06/2008   DEGENERATIVE [place] DISEASE 11/06/2008    Past Surgical History:  Procedure Laterality Date   APPENDECTOMY  1953   BACK SURGERY     C-Spine and L-Spine   CARDIAC CATHETERIZATION     CARDIOVASCULAR STRESS TEST  2012   Normal   COLONOSCOPY  02/26/02; 07/2011   Colonoscopy by Dr. Rowe Pavy 2003 was normal (+hx of polyps (adenomatous?) prior), 07/2011 showed one hyperplastic polyp. NO FURTHER SCREENING COLONOSCOPIES INDICTAED as of 12/2018 GI eval.   CORONARY STENT PLACEMENT     x 2 (LAD)    CYSTOSCOPY WITH RETROGRADE PYELOGRAM, URETEROSCOPY AND STENT PLACEMENT Left 05/10/2018   Procedure: CYSTOSCOPY WITH LEFT RETROGRADE PYELOGRAM, LEFT URETEROSCOPY AND LEFT URETERAL STENT PLACEMENT;  Surgeon: Alexis Frock, MD;  [facility];  Service: Urology;  Laterality: Left;   HOLMIUM LASER APPLICATION Left 06/14/7865   Procedure: HOLMIUM LASER APPLICATION;  Surgeon: Alexis Frock, MD;  [facility];  Service: Urology;  Laterality: Left;   INCISIONAL HERNIA REPAIR N/A 03/19/2019   Procedure: LAPAROSCOPIC INCISIONAL HERNIA REPAIR WITH MESH, RECURRENT UMBILICAL HERNIA REPAIR;  Surgeon: Johnell Bas Boston, MD;  [facility];  Service: General;  Laterality: N/A;   INGUINAL HERNIA REPAIR Right 03/19/2019   Procedure: LAPAROSCOPIC BILATERAL FEMORAL AND INGUINAL HERNIA REPAIR WITH MESH, LYSIS OF ADHESIONS;  Surgeon: Laquanna Veazey Boston, MD;  [facility];  Service: General;  Laterality: Right;   LEFT HEART CATH AND CORONARY ANGIOGRAPHY N/A 03/21/2017   Stents patent; mod circ dz, nothing for intervention--imdur added.  Procedure: LEFT HEART CATH AND CORONARY ANGIOGRAPHY;  Surgeon: Nelva Bush, MD;  Location: [place] [facility];  Service: Cardiovascular;  Laterality: N/A;   LOBECTOMY     right upper lobectomy for blastomycosis   LYMPHADENECTOMY Bilateral 07/14/2015   Procedure: LYMPHADENECTOMY;  Surgeon: Raynelle Bring, MD;  [facility];  Service: Urology;  Laterality: Bilateral;   PROSTATE BIOPSY  04/27/15   4 of 12 bx core's + prostate adenocarcinoma (tentative treatment plan is prostatectomy ? + other therapies (as of 05/18/15)   [place] N/A 07/14/2015   One positive pelvic LN.  Procedure: XI ROBOTIC ASSISTED LAPAROSCOPIC RADICAL PROSTATECTOMY LEVEL 2;  Surgeon: Raynelle Bring, MD;  [facility];  Service: Urology;  Laterality: N/A;   UMBILICAL HERNIA REPAIR  07/14/2015   Procedure: HERNIA REPAIR UMBILICAL ADULT;  Surgeon: Raynelle Bring, MD;   [facility];  Service: Urology;;       Home Medications    Prior to Admission medications   Medication Sig Start Date End Date Taking? Authorizing Provider  amoxicillin-clavulanate (AUGMENTIN) 875-125 MG tablet Take 1 tablet by mouth every 12 (twelve) hours. 05/11/21  Yes Eliezer Lofts, FNP  benzonatate (TESSALON) 200 MG capsule Take 1 capsule (200 mg total) by mouth 3 (three) times daily as needed for up to 7 days for cough. 05/11/21 05/18/21 Yes Eliezer Lofts, FNP  Multiple Vitamin (MULTIVITAMIN) tablet Take 1 tablet by mouth daily.   Yes [provider]  Cholecalciferol (VITAMIN D3) 5000 units CAPS Take 5,000 Units every evening by mouth.    [provider]  Coenzyme Q10 (COQ10) 100 MG CAPS Take 100 mg by mouth every evening.    [provider]    Family History Family History  Problem Relation Age of Onset   Cervical cancer Mother        deceased   Cirrhosis Father    Colon cancer Neg Hx     Social  History Social History   Tobacco Use   Smoking status: Former    Packs/day: 0.50    Years: 20.00    Pack years: 10.00    Types: Cigarettes    Quit date: 05/07/1976    Years since quitting: 45.0   Smokeless tobacco: Former    Types: Chew    Quit date: 05/07/1998   Tobacco comments:    [place]  Vaping Use   Vaping Use: Never used  Substance Use Topics   Alcohol use: Yes    Comment: 1 beer every 6 months   Drug use: No     Allergies   Ace inhibitors and Imdur [isosorbide dinitrate]   Review of Systems Review of Systems  HENT:  Positive for congestion.   Respiratory:  Positive for cough.   All other systems reviewed and are negative.   Physical Exam Triage Vital Signs ED Triage Vitals  Enc Vitals Group     BP      Pulse      Resp      Temp      Temp src      SpO2      Weight      Height      Head Circumference      Peak Flow      Pain Score      Pain Loc      Pain Edu?      Excl. in [place]?    No data found.  Updated  Vital Signs BP 132/75 (BP Location: Left Arm)    Pulse 66    Temp (!) 97.5 F (36.4 C) (Oral)    Resp 14    Wt 152 lb (68.9 kg)    SpO2 99%    BMI 22.78 kg/m      Physical Exam Vitals and nursing note reviewed.  Constitutional:      Appearance: Normal appearance. He is normal weight.  HENT:     Right Ear: Tympanic membrane, ear canal and external ear normal.     Left Ear: Tympanic membrane, ear canal and external ear normal.     Mouth/Throat:     Mouth: Mucous membranes are moist.     Pharynx: Oropharynx is clear.  Eyes:     Extraocular Movements: Extraocular movements intact.     Conjunctiva/sclera: Conjunctivae normal.     Pupils: Pupils are equal, round, and reactive to light.  Cardiovascular:     Rate and Rhythm: Normal rate and regular rhythm.     Pulses: Normal pulses.     Heart sounds: Normal heart sounds.  Pulmonary:     Effort: Pulmonary effort is normal.     Breath sounds: Rhonchi present. No wheezing or rales.     Comments: Mild diffuse scattered rhonchi noted throughout Musculoskeletal:     Cervical back: Normal range of motion and neck supple.  Skin:    General: Skin is warm and dry.  Neurological:     General: No focal deficit present.     Mental Status: He is alert and oriented to person, place, and time.     UC Treatments / Results  Labs (all labs ordered are listed, but only abnormal results are displayed) Labs Reviewed - No data to display  EKG   Radiology No results found.  Procedures Procedures (including critical care time)  Medications Ordered in UC Medications - No data to display  Initial Impression / Assessment and Plan / UC Course  I have reviewed the  triage vital signs and the nursing notes.  Pertinent labs & imaging results that were available during my care of the patient were reviewed by me and considered in my medical decision making (see chart for details).     MDM: 1.  Cough-Rx'd Augmentin and Tessalon Perles.  Advised/instructed patient to take medication as directed with food to completion.  Advised may use Tessalon Perles daily or as needed for cough.  Encouraged patient to increase daily water intake while taking these medications.  Patient discharged home, hemodynamically stable. Final Clinical Impressions(s) / UC Diagnoses   Final diagnoses:  Cough, unspecified type     Discharge Instructions      Advised/instructed patient to take medication as directed with food to completion.  Advised may use Tessalon Perles daily or as needed for cough.  Encouraged patient to increase daily water intake while taking these medications.     ED Prescriptions     Medication Sig Dispense Auth. Provider   amoxicillin-clavulanate (AUGMENTIN) 875-125 MG tablet Take 1 tablet by mouth every 12 (twelve) hours. 14 tablet Eliezer Lofts, FNP   benzonatate (TESSALON) 200 MG capsule Take 1 capsule (200 mg total) by mouth 3 (three) times daily as needed for up to 7 days for cough. 40 capsule Eliezer Lofts, FNP      PDMP not reviewed this encounter.   Eliezer Lofts, [place] 05/11/21 [id number]

## 2021-05-11 NOTE — Discharge Instructions (Addendum)
Advised/instructed patient to take medication as directed with food to completion.  Advised may use Tessalon Perles daily or as needed for cough.  Encouraged patient to increase daily water intake while taking these medications.

## 2021-05-19 DIAGNOSIS — Z8546 Personal history of malignant neoplasm of prostate: Secondary | ICD-10-CM | POA: Diagnosis not present

## 2021-05-19 LAB — PSA: PSA: <0.015

## 2021-05-26 DIAGNOSIS — Z8546 Personal history of malignant neoplasm of prostate: Secondary | ICD-10-CM | POA: Diagnosis not present

## 2021-06-09 DIAGNOSIS — Z8546 Personal history of malignant neoplasm of prostate: Secondary | ICD-10-CM | POA: Diagnosis not present

## 2021-06-09 DIAGNOSIS — N5201 Erectile dysfunction due to arterial insufficiency: Secondary | ICD-10-CM | POA: Diagnosis not present

## 2021-06-12 DIAGNOSIS — L821 Other seborrheic keratosis: Secondary | ICD-10-CM | POA: Diagnosis not present

## 2021-06-12 DIAGNOSIS — L570 Actinic keratosis: Secondary | ICD-10-CM | POA: Diagnosis not present

## 2021-06-12 DIAGNOSIS — D225 Melanocytic nevi of trunk: Secondary | ICD-10-CM | POA: Diagnosis not present

## 2021-06-12 DIAGNOSIS — L814 Other melanin hyperpigmentation: Secondary | ICD-10-CM | POA: Diagnosis not present

## 2021-06-12 DIAGNOSIS — Z23 Encounter for immunization: Secondary | ICD-10-CM | POA: Diagnosis not present

## 2021-06-12 DIAGNOSIS — L578 Other skin changes due to chronic exposure to nonionizing radiation: Secondary | ICD-10-CM | POA: Diagnosis not present

## 2021-06-12 DIAGNOSIS — Z85828 Personal history of other malignant neoplasm of skin: Secondary | ICD-10-CM | POA: Diagnosis not present

## 2021-07-10 ENCOUNTER — Other Ambulatory Visit: Payer: Self-pay

## 2021-07-10 ENCOUNTER — Encounter: Payer: Self-pay | Admitting: Family Medicine

## 2021-07-10 ENCOUNTER — Ambulatory Visit (INDEPENDENT_AMBULATORY_CARE_PROVIDER_SITE_OTHER): Payer: 59 | Admitting: Family Medicine

## 2021-07-10 VITALS — BP 128/70 | HR 58 | Temp 97.4°F | Ht 68.75 in | Wt 161.8 lb

## 2021-07-10 DIAGNOSIS — I2510 Atherosclerotic heart disease of native coronary artery without angina pectoris: Secondary | ICD-10-CM

## 2021-07-10 DIAGNOSIS — N5232 Erectile dysfunction following radical cystectomy: Secondary | ICD-10-CM | POA: Diagnosis not present

## 2021-07-10 DIAGNOSIS — Z0000 Encounter for general adult medical examination without abnormal findings: Secondary | ICD-10-CM

## 2021-07-10 DIAGNOSIS — I10 Essential (primary) hypertension: Secondary | ICD-10-CM

## 2021-07-10 LAB — CBC WITH DIFFERENTIAL/PLATELET
Basophils Absolute: 0 10*3/uL (ref 0.0–0.1)
Basophils Relative: 0.3 % (ref 0.0–3.0)
Eosinophils Absolute: 0.2 10*3/uL (ref 0.0–0.7)
Eosinophils Relative: 4.9 % (ref 0.0–5.0)
HCT: 45.1 % (ref 39.0–52.0)
Hemoglobin: 15.2 g/dL (ref 13.0–17.0)
Lymphocytes Relative: 34.7 % (ref 12.0–46.0)
Lymphs Abs: 1.1 10*3/uL (ref 0.7–4.0)
MCHC: 33.8 g/dL (ref 30.0–36.0)
MCV: 100.5 fl — ABNORMAL HIGH (ref 78.0–100.0)
Monocytes Absolute: 0.2 10*3/uL (ref 0.1–1.0)
Monocytes Relative: 6.3 % (ref 3.0–12.0)
Neutro Abs: 1.7 10*3/uL (ref 1.4–7.7)
Neutrophils Relative %: 53.8 % (ref 43.0–77.0)
Platelets: 133 10*3/uL — ABNORMAL LOW (ref 150.0–400.0)
RBC: 4.49 Mil/uL (ref 4.22–5.81)
RDW: 14.6 % (ref 11.5–15.5)
WBC: 3.2 10*3/uL — ABNORMAL LOW (ref 4.0–10.5)

## 2021-07-10 LAB — COMPREHENSIVE METABOLIC PANEL
ALT: 16 U/L (ref 0–53)
AST: 19 U/L (ref 0–37)
Albumin: 4.6 g/dL (ref 3.5–5.2)
Alkaline Phosphatase: 77 U/L (ref 39–117)
BUN: 16 mg/dL (ref 6–23)
CO2: 27 mEq/L (ref 19–32)
Calcium: 9 mg/dL (ref 8.4–10.5)
Chloride: 102 mEq/L (ref 96–112)
Creatinine, Ser: 0.92 mg/dL (ref 0.40–1.50)
GFR: 79.05 mL/min (ref >60.00)
Glucose, Bld: 84 mg/dL (ref 70–99)
Potassium: 4.1 mEq/L (ref 3.5–5.1)
Sodium: 138 mEq/L (ref 135–145)
Total Bilirubin: 0.4 mg/dL (ref 0.2–1.2)
Total Protein: 6.9 g/dL (ref 6.0–8.3)

## 2021-07-10 LAB — LIPID PANEL
Cholesterol: 170 mg/dL (ref 0–200)
HDL: 41.3 mg/dL (ref >39.00)
LDL Cholesterol: 100 mg/dL — ABNORMAL HIGH (ref 0–99)
NonHDL: 128.74
Total CHOL/HDL Ratio: 4
Triglycerides: 146 mg/dL (ref 0.0–149.0)
VLDL: 29.2 mg/dL (ref 0.0–40.0)

## 2021-07-10 NOTE — Telephone Encounter (Signed)
Ok, referral ordered ?

## 2021-07-10 NOTE — Progress Notes (Signed)
Office Note 07/10/2021  CC:  Chief Complaint  Patient presents with   Annual Exam    Pt is fasting    HPI:  Patient is a 81 y.o. male who is here for annual health maintenance exam. A/P as of last visit: "1) HTN, hx of:  great bp OFF of medication. Lytes/cr today.   2) HLD: hx of.  Great diet, very active. No meds per his choice. LDL 6 mo ago was 153. FLP and hepatic panel today.   3) CAD, hx of stent. Asymptomatic. He has decided not to take any ASA or statin. He'll have f/u with Dr. Percival Spanish later this year.   4) Hepatic steatosis. Hx of only very mildly elev AST and ALT, none on recent checks. Diet very good. Hepatic panel today.   5) CRI II/III: GFR baseline 60s. Lytes/cr today."  INTERIM HX: He is feeling well. He is on furlough from work--feels a little bored but is trying to keep himself busy.  He saw cardiology since I saw him last, no changes made.  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 No further colon ca screening indicated as of 12/2018 GI eval (Dr. Loletha Carrow).   Coronary artery disease 12/22/2007   Cath 03/2017-->  70 - 80% mid/distal circ stenosis and a small D1 80% stenosis and proximal stents in the prox and mid LAD.   Med mgmt rec'd-->nitrates caused HAs, ACE-I caused cough.   DDD (degenerative disc disease)    s/p surgery; L/S spine MRI 05/2004 showed DDD/spondylosis with right L3 nerve root abutment, with L5/S1 surgical changes.   Diverticulosis of sigmoid colon 07/25/11   Severe (endoscopy by Dr. Sharlett Iles)   Erectile dysfunction    Gallbladder polyp 2013   Noted 06/2011.  Stable on u/s f/u 10/2011.   Gallstone 05/2018   Asymptomatic  (picked up on CT abd/pelv).   Hearing loss 2004   Dr. Tamala Julian, ENT   History of prostate cancer 12/2014   Robot assisted radical prostatectomy 07/2015: lymph node involvement.  Plan is active surveillance as per urology: As of 05/2020 PSA undetectable.   HTN (hypertension)    ACE-I cough.  Fine on ARB.   Hyperlipidemia    NASH (nonalcoholic steatohepatitis) 06/2011   mild transaminasemia (Hep B and C testing neg)   Pulmonary nodules    small, picked up on CT ab/pelv for abd pains/flank pains-->pt quit smoking 40 yrs ago.  Low risk for lung ca so no f/u CT needed.   Tinnitus    Ureterolithiasis    8 mm left distal ureteral stone 05/2018, got ureteroscopy with holmium laser treatment.    Past Surgical History:  Procedure Laterality Date   APPENDECTOMY  1953   BACK SURGERY     C-Spine and L-Spine   CARDIAC CATHETERIZATION     CARDIOVASCULAR STRESS TEST  2012   Normal   COLONOSCOPY  02/26/02; 07/2011   Colonoscopy by Dr. Rowe Pavy 2003 was normal (+hx of polyps (adenomatous?) prior), 07/2011 showed one hyperplastic polyp. NO FURTHER SCREENING COLONOSCOPIES INDICTAED as of 12/2018 GI eval.   CORONARY STENT PLACEMENT     x 2 (LAD)  CYSTOSCOPY WITH RETROGRADE PYELOGRAM, URETEROSCOPY AND STENT PLACEMENT Left 05/10/2018   Procedure: CYSTOSCOPY WITH LEFT RETROGRADE PYELOGRAM, LEFT URETEROSCOPY AND LEFT URETERAL STENT PLACEMENT;  Surgeon: Alexis Frock, MD;  [facility];  Service: Urology;  Laterality: Left;   HOLMIUM LASER APPLICATION Left 0/07/5595   Procedure: HOLMIUM LASER APPLICATION;  Surgeon: Alexis Frock, MD;  [facility];  Service: Urology;  Laterality: Left;   INCISIONAL HERNIA REPAIR N/A 03/19/2019   Procedure: LAPAROSCOPIC INCISIONAL HERNIA REPAIR WITH MESH, RECURRENT UMBILICAL HERNIA REPAIR;  Surgeon: Michael Boston, MD;  [facility]: [facility];  Service: General;  Laterality: N/A;   INGUINAL HERNIA REPAIR Right 03/19/2019   Procedure: LAPAROSCOPIC BILATERAL FEMORAL AND INGUINAL  HERNIA REPAIR WITH MESH, LYSIS OF ADHESIONS;  Surgeon: Michael Boston, MD;  [facility];  Service: General;  Laterality: Right;   LEFT HEART CATH AND CORONARY ANGIOGRAPHY N/A 03/21/2017   Stents patent; mod circ dz, nothing for intervention--imdur added.  Procedure: LEFT HEART CATH AND CORONARY ANGIOGRAPHY;  Surgeon: Nelva Bush, MD;  Location: [place] [facility];  Service: Cardiovascular;  Laterality: N/A;   LOBECTOMY     right upper lobectomy for blastomycosis   LYMPHADENECTOMY Bilateral 07/14/2015   Procedure: LYMPHADENECTOMY;  Surgeon: Raynelle Bring, MD;  [facility];  Service: Urology;  Laterality: Bilateral;   PROSTATE BIOPSY  04/27/15   4 of 12 bx core's + prostate adenocarcinoma (tentative treatment plan is prostatectomy ? + other therapies (as of 05/18/15)   Liborio Negron Torres N/A 07/14/2015   One positive pelvic LN.  Procedure: XI ROBOTIC ASSISTED LAPAROSCOPIC RADICAL PROSTATECTOMY LEVEL 2;  Surgeon: Raynelle Bring, MD;  [facility];  Service: Urology;  Laterality: N/A;   UMBILICAL HERNIA REPAIR  07/14/2015   Procedure: HERNIA REPAIR UMBILICAL ADULT;  Surgeon: Raynelle Bring, MD;  [facility];  Service: Urology;;    Family History  Problem Relation Age of Onset   Cervical cancer Mother        deceased   Cirrhosis Father    Colon cancer Neg Hx     Social History   Socioeconomic History   Marital status: Married    Spouse name: Not on file   Number of children: 2   Years of education: Not on file   Highest education level: Not on file  Occupational History   Occupation: Retired  Tobacco Use   Smoking status: Former    Packs/day: 0.50    Years: 20.00    Pack years: 10.00    Types: Cigarettes    Quit date: 05/07/1976    Years since quitting: 45.2   Smokeless tobacco: Former    Types: Chew    Quit date: 05/07/1998   Tobacco comments:    Scoal  Vaping Use   Vaping Use: Never used  Substance and Sexual Activity   Alcohol  use: Yes    Comment: 1 beer every 6 months   Drug use: No   Sexual activity: Not on file  Other Topics Concern   Not on file  Social History Narrative   Married, 2 adult children ([place] and [place]).   Lives in [place].     Occ: Retired from [employer] ([place] work) at age 81.   Tob (smoke and chew) x 20 yrs, quit approx 1980s.   Alcohol: very rare.   One cup coffee each morning.   Exercise: walks 3 miles about 4 times per week.   Active lifestyle.   Social Determinants of Health  Financial Resource Strain: Low Risk    Difficulty of Paying Living Expenses: Not hard at all  Food Insecurity: No Food Insecurity   Worried About Charity fundraiser in the Last Year: Never true   Ran Out of Food in the Last Year: Never true  Transportation Needs: No Transportation Needs   Lack of Transportation (Medical): No   Lack of Transportation (Non-Medical): No  Physical Activity: Insufficiently Active   Days of Exercise per Week: 7 days   Minutes of Exercise per Session: 20 min  Stress: No Stress Concern Present   Feeling of Stress : Not at all  Social Connections: Moderately Isolated   Frequency of Communication with Friends and Family: More than three times a week   Frequency of Social Gatherings with Friends and Family: More than three times a week   Attends Religious Services: Never   Marine scientist or Organizations: No   Attends Music therapist: Never   Marital Status: Married  Human resources officer Violence: Not At Risk   Fear of Current or Ex-Partner: No   Emotionally Abused: No   Physically Abused: No   Sexually Abused: No    Outpatient Medications Prior to Visit  Medication Sig Dispense Refill   Cholecalciferol (VITAMIN D3) 5000 units CAPS Take 5,000 Units every evening by mouth.     Coenzyme Q10 (COQ10) 100 MG CAPS Take 100 mg by mouth every evening.     Multiple Vitamin (MULTIVITAMIN) tablet Take 1 tablet by mouth daily.     amoxicillin-clavulanate  (AUGMENTIN) 875-125 MG tablet Take 1 tablet by mouth every 12 (twelve) hours. (Patient not taking: Reported on 07/10/2021) 14 tablet 0   No facility-administered medications prior to visit.    Allergies  Allergen Reactions   Ace Inhibitors Cough   Imdur [Isosorbide Dinitrate] Other (See Comments)    Headache    ROS Review of Systems  Constitutional:  Negative for appetite change, chills, fatigue and fever.  HENT:  Negative for congestion, dental problem, ear pain and sore throat.   Eyes:  Negative for discharge, redness and visual disturbance.  Respiratory:  Negative for cough, chest tightness, shortness of breath and wheezing.   Cardiovascular:  Negative for chest pain, palpitations and leg swelling.  Gastrointestinal:  Negative for abdominal pain, blood in stool, diarrhea, nausea and vomiting.  Genitourinary:  Negative for difficulty urinating, dysuria, flank pain, frequency, hematuria and urgency.  Musculoskeletal:  Negative for arthralgias, back pain, joint swelling, myalgias and neck stiffness.  Skin:  Negative for pallor and rash.  Neurological:  Negative for dizziness, speech difficulty, weakness and headaches.  Hematological:  Negative for adenopathy. Does not bruise/bleed easily.  Psychiatric/Behavioral:  Negative for confusion and sleep disturbance. The patient is not nervous/anxious.    PE; Vitals with BMI 07/10/2021 05/11/2021 03/10/2021  Height 5' 8.75" - 5' 8.5"  Weight 161 lbs 13 oz 152 lbs 160 lbs 13 oz  BMI 86.16 - 83.72  Systolic 902 111 552  Diastolic 70 75 70  Pulse 58 66 46   Gen: Alert, well appearing.  Patient is oriented to person, place, time, and situation. AFFECT: pleasant, lucid thought and speech. ENT: Ears: EACs clear, normal epithelium.  TMs with good light reflex and landmarks bilaterally.  Eyes: no injection, icteris, swelling, or exudate.  EOMI, PERRLA. Nose: no drainage or turbinate edema/swelling.  No injection or focal lesion.  Mouth: lips without  lesion/swelling.  Oral mucosa pink and moist.  Dentition intact and without obvious caries  or gingival swelling.  Oropharynx without erythema, exudate, or swelling.  Neck: supple/nontender.  No LAD, mass, or TM.  Carotid pulses 2+ bilaterally, without bruits. CV: RRR, no m/r/g.   LUNGS: CTA bilat, nonlabored resps, good aeration in all lung fields. ABD: soft, NT, ND, BS normal.  No hepatospenomegaly or mass.  No bruits. EXT: no clubbing, cyanosis, or edema.  Musculoskeletal: no joint swelling, erythema, warmth, or tenderness.  ROM of all joints intact. Skin - no sores or suspicious lesions or rashes or color changes  Pertinent labs:  Lab Results  Component Value Date   TSH 3.080 03/15/2017   Lab Results  Component Value Date   WBC 3.4 (L) 07/01/2020   HGB 14.7 07/01/2020   HCT 43.5 07/01/2020   MCV 98.8 07/01/2020   PLT 148.0 (L) 07/01/2020   Lab Results  Component Value Date   CREATININE 1.06 12/30/2020   BUN 17 12/30/2020   NA 137 12/30/2020   K 4.9 12/30/2020   CL 102 12/30/2020   CO2 24 12/30/2020   Lab Results  Component Value Date   ALT 19 12/30/2020   AST 20 12/30/2020   ALKPHOS 85 12/30/2020   BILITOT 0.5 12/30/2020   Lab Results  Component Value Date   CHOL 186 12/30/2020   Lab Results  Component Value Date   HDL 40.30 12/30/2020   Lab Results  Component Value Date   LDLCALC 153 (H) 07/01/2020   Lab Results  Component Value Date   TRIG 215.0 (H) 12/30/2020   Lab Results  Component Value Date   CHOLHDL 5 12/30/2020   Lab Results  Component Value Date   PSA <0.015 05/19/2021   PSA <0.015 05/27/2020   PSA <0.015 07/20/2016   Lab Results  Component Value Date   TESTOSTERONE 372.14 10/13/2010   ASSESSMENT AND PLAN:    Health maintenance exam: Reviewed age and gender appropriate health maintenance issues (prudent diet, regular exercise, health risks of tobacco and excessive alcohol, use of seatbelts, fire alarms in home, use of sunscreen).   Also reviewed age and gender appropriate health screening as well as vaccine recommendations. Vaccines: ALL UTD. Labs: cbc w/diff, cmet, flp. Prostate ca screening: pt w/hx of prostate ca, gets PSA surveillance by urologist. Colon ca screening: no further screening is indicated.  Erectile dysfunction. He has had this since prostatectomy and has good libido and wishes to pursue penile implant. He has a specific physician picked up and cannot recall the exact name so he will call back with this information and I will make referral.  Coronary artery disease, hypertension and hyperlipidemia. Blood pressure good off medication. He prefers to stay off all medications but does want to follow labs at this point  An After Visit Summary was printed and given to the patient.  FOLLOW UP:  No follow-ups on file.  Signed:  Crissie Sickles, MD           07/10/2021

## 2021-07-10 NOTE — Patient Instructions (Signed)

## 2021-07-12 ENCOUNTER — Other Ambulatory Visit: Payer: Self-pay

## 2021-07-12 ENCOUNTER — Encounter (INDEPENDENT_AMBULATORY_CARE_PROVIDER_SITE_OTHER): Payer: 59 | Admitting: Ophthalmology

## 2021-07-12 DIAGNOSIS — H353132 Nonexudative age-related macular degeneration, bilateral, intermediate dry stage: Secondary | ICD-10-CM

## 2021-07-12 DIAGNOSIS — H43813 Vitreous degeneration, bilateral: Secondary | ICD-10-CM | POA: Diagnosis not present

## 2021-07-19 ENCOUNTER — Encounter (INDEPENDENT_AMBULATORY_CARE_PROVIDER_SITE_OTHER): Payer: 59 | Admitting: Ophthalmology

## 2021-09-06 ENCOUNTER — Ambulatory Visit: Payer: 59

## 2021-09-06 ENCOUNTER — Other Ambulatory Visit: Payer: Self-pay | Admitting: Cardiology

## 2021-09-06 ENCOUNTER — Other Ambulatory Visit (HOSPITAL_BASED_OUTPATIENT_CLINIC_OR_DEPARTMENT_OTHER): Payer: Self-pay

## 2021-09-06 ENCOUNTER — Ambulatory Visit (INDEPENDENT_AMBULATORY_CARE_PROVIDER_SITE_OTHER): Payer: 59

## 2021-09-06 DIAGNOSIS — Z0000 Encounter for general adult medical examination without abnormal findings: Secondary | ICD-10-CM

## 2021-09-06 NOTE — Progress Notes (Signed)
Virtual Visit via Telephone Note ? ?I connected with  William Montgomery on 09/06/21 at  8:00 AM EDT by telephone and verified that I am speaking with the correct person using two identifiers. ? ?Medicare Annual Wellness visit completed telephonically due to Covid-19 pandemic.  ? ?Persons participating in this call: This Health Coach and this patient.  ? ?Location: ?Patient: home ?Provider: office ?  ?I discussed the limitations, risks, security and privacy concerns of performing an evaluation and management service by telephone and the availability of in person appointments. The patient expressed understanding and agreed to proceed. ? ?Unable to perform video visit due to video visit attempted and failed and/or patient does not have video capability.  ? ?Some vital signs may be absent or patient reported.  ? ?Willette Brace, LPN ? ? ?Subjective:  ? William Montgomery is a 81 y.o. male who presents for Medicare Annual/Subsequent preventive examination. ? ?Review of Systems    ? ?Cardiac Risk Factors include: advanced age (>34mn, >>32women);dyslipidemia;hypertension;male gender ? ?   ?Objective:  ?  ?There were no vitals filed for this visit. ?There is no height or weight on file to calculate BMI. ? ? ?  09/06/2021  ?  7:59 AM 08/31/2020  ?  8:20 AM 03/19/2019  ?  7:23 AM 03/16/2019  ?  9:09 AM 05/30/2018  ?  9:12 AM 05/09/2018  ?  1:56 PM 05/08/2018  ?  8:56 AM  ?Advanced Directives  ?Does Patient Have a Medical Advance Directive? Yes Yes Yes Yes Yes Yes No  ?Type of [place] Living will Living will Living will;Healthcare Power of Attorney    ?Does patient want to make changes to medical advance directive?   No - Patient declined No - Patient declined     ?[place]? Yes - validated most recent copy scanned in chart (See row information) Yes - validated most recent copy scanned in chart (See row information) No -  copy requested No - copy requested Yes - validated most recent copy scanned in chart (See row information)    ?Would patient like information on creating a medical advance directive?       No - Patient declined  ? ? ?Current Medications (verified) ?Outpatient Encounter Medications as of 09/06/2021  ?Medication Sig  ? Cholecalciferol (VITAMIN D3) 5000 units CAPS Take 5,000 Units every evening by mouth.  ? Coenzyme Q10 (COQ10) 100 MG CAPS Take 100 mg by mouth every evening.  ? Multiple Vitamin (MULTIVITAMIN) tablet Take 1 tablet by mouth daily.  ? Multiple Vitamins-Minerals (ICAPS AREDS 2 PO)   ? niacin 100 MG tablet Take by mouth.  ? ?No facility-administered encounter medications on file as of 09/06/2021.  ? ? ?Allergies (verified) ?Ace inhibitors and Imdur [isosorbide dinitrate]  ? ?History: ?Past Medical History:  ?Diagnosis Date  ? Arthritis   ? Blastomycosis 1977  ? Right upper lobe  ? Bloating 2020  ? suspected to be from chronic probiotic use  ? BPH (benign prostatic hyperplasia)   ? Chronic renal insufficiency, stage 2 (mild)   ? borderlines II/III  ? Colon polyps 07/2011  ? hyperplastic 07/25/2011. No further colon ca screening indicated as of 12/2018 GI eval (Dr. DLoletha Carrow.  ? Coronary artery disease 12/22/2007  ? Cath 03/2017-->  70 - 80% mid/distal circ stenosis and a small D1 80% stenosis and proximal stents in the prox and mid LAD.  Med mgmt rec'd-->nitrates caused HAs, ACE-I caused cough.  ? DDD (degenerative disc disease)   ? s/p surgery; L/S spine MRI 05/2004 showed DDD/spondylosis with right L3 nerve root abutment, with L5/S1 surgical changes.  ? Diverticulosis of sigmoid colon 07/25/2011  ? Severe (endoscopy by Dr. Sharlett Iles)  ? Erectile dysfunction   ? Gallbladder polyp 2013  ? Noted 06/2011.  Stable on u/s f/u 10/2011.  ? Gallstone 05/2018  ? Asymptomatic (picked up on CT abd/pelv).  ? Hearing loss 2004  ? Dr. Tamala Julian, ENT  ? History of prostate cancer 12/2014  ? Robot assisted radical prostatectomy 07/2015:  lymph node involvement.  Plan is active surveillance as per urology: As of 05/2020 PSA undetectable.  ? HTN (hypertension)   ? ACE-I cough.  Fine on ARB.  ? Hyperlipidemia   ? NASH (nonalcoholic steatohepatitis) 06/2011  ? Hep B and C testing neg.  Mild fatty liver on CT 2020  ? Pulmonary nodules   ? small, picked up on CT ab/pelv for abd pains/flank pains-->pt quit smoking 40 yrs ago.  Low risk for lung ca so no f/u CT needed.  ? Tinnitus   ? Ureterolithiasis   ? 8 mm left distal ureteral stone 05/2018, got ureteroscopy with holmium laser treatment.  ? ?Past Surgical History:  ?Procedure Laterality Date  ? APPENDECTOMY  1953  ? BACK SURGERY    ? C-Spine and L-Spine  ? CARDIAC CATHETERIZATION    ? CARDIOVASCULAR STRESS TEST  2012  ? Normal  ? COLONOSCOPY  02/26/02; 07/2011  ? Colonoscopy by Dr. Rowe Pavy 2003 was normal (+hx of polyps (adenomatous?) prior), 07/2011 showed one hyperplastic polyp. NO FURTHER SCREENING COLONOSCOPIES INDICTAED as of 12/2018 GI eval.  ? CORONARY STENT PLACEMENT    ? x 2 (LAD)  ? CYSTOSCOPY WITH RETROGRADE PYELOGRAM, URETEROSCOPY AND STENT PLACEMENT Left 05/10/2018  ? Procedure: CYSTOSCOPY WITH LEFT RETROGRADE PYELOGRAM, LEFT URETEROSCOPY AND LEFT URETERAL STENT PLACEMENT;  Surgeon: Alexis Frock, MD;  [facility];  Service: Urology;  Laterality: Left;  ? HOLMIUM LASER APPLICATION Left 4/0/1027  ? Procedure: HOLMIUM LASER APPLICATION;  Surgeon: Alexis Frock, MD;  [facility];  Service: Urology;  Laterality: Left;  ? INCISIONAL HERNIA REPAIR N/A 03/19/2019  ? Procedure: LAPAROSCOPIC INCISIONAL HERNIA REPAIR WITH MESH, RECURRENT UMBILICAL HERNIA REPAIR;  Surgeon: Michael Boston, MD;  [facility];  Service: General;  Laterality: N/A;  ? INGUINAL HERNIA REPAIR Right 03/19/2019  ? Procedure: LAPAROSCOPIC BILATERAL FEMORAL AND INGUINAL HERNIA REPAIR WITH MESH, LYSIS OF ADHESIONS;  Surgeon: Michael Boston, MD;  [facility];  Service: General;  Laterality: Right;  ? LEFT HEART CATH  AND CORONARY ANGIOGRAPHY N/A 03/21/2017  ? Stents patent; mod circ dz, nothing for intervention--imdur added.  Procedure: LEFT HEART CATH AND CORONARY ANGIOGRAPHY;  Surgeon: Nelva Bush, MD;  Location: [facility];  Service: Cardiovascular;  Laterality: N/A;  ? LOBECTOMY    ? right upper lobectomy for blastomycosis  ? LYMPHADENECTOMY Bilateral 07/14/2015  ? Procedure: LYMPHADENECTOMY;  Surgeon: Raynelle Bring, MD;  [facility];  Service: Urology;  Laterality: Bilateral;  ? PROSTATE BIOPSY  04/27/15  ? 4 of 12 bx core's + prostate adenocarcinoma (tentative treatment plan is prostatectomy ? + other therapies (as of 05/18/15)  ? ROBOT ASSISTED LAPAROSCOPIC RADICAL PROSTATECTOMY N/A 07/14/2015  ? One positive pelvic LN.  Procedure: XI ROBOTIC ASSISTED LAPAROSCOPIC RADICAL PROSTATECTOMY LEVEL 2;  Surgeon: Raynelle Bring, MD;  [facility];  Service: Urology;  Laterality: N/A;  ? UMBILICAL  HERNIA REPAIR  07/14/2015  ? Procedure: HERNIA REPAIR UMBILICAL ADULT;  Surgeon: Raynelle Bring, MD;  [facility];  Service: Urology;;  ? ?Family History  ?Problem Relation Age of Onset  ? Cervical cancer Mother   ?     deceased  ? Cirrhosis Father   ? Colon cancer Neg Hx   ? ?Social History  ? ?Socioeconomic History  ? Marital status: Married  ?  Spouse name: Not on file  ? Number of children: 2  ? Years of education: Not on file  ? Highest education level: Not on file  ?Occupational History  ? Occupation: Retired  ?Tobacco Use  ? Smoking status: Former  ?  Packs/day: 0.50  ?  Years: 20.00  ?  Pack years: 10.00  ?  Types: Cigarettes  ?  Quit date: 05/07/1976  ?  Years since quitting: 45.3  ? Smokeless tobacco: Former  ?  Types: Chew  ?  Quit date: 05/07/1998  ? Tobacco comments:  ?  Scoal  ?Vaping Use  ? Vaping Use: Never used  ?Substance and Sexual Activity  ? Alcohol use: Yes  ?  Comment: 1 beer every 6 months  ? Drug use: No  ? Sexual activity: Not on file  ?Other Topics Concern  ? Not on file  ?Social History Narrative   ? Married, 2 adult children ([place] and [place]).  ? Lives in [place].    ? Occ: Retired from [employer] ([place] work) at age 74.  ? Tob (smoke and chew) x 20 yrs, quit approx 1980s.  ? Alcohol: very rare.

## 2021-09-06 NOTE — Patient Instructions (Signed)
William Montgomery , ?Thank you for taking time to come for your Medicare Wellness Visit. I appreciate your ongoing commitment to your health goals. Please review the following plan we discussed and let me know if I can assist you in the future.  ? ?Screening recommendations/referrals: ?Colonoscopy: no longer required  ?Recommended yearly ophthalmology/optometry visit for glaucoma screening and checkup ?Recommended yearly dental visit for hygiene and checkup ? ?Vaccinations: ?Influenza vaccine: postponed  ?Pneumococcal vaccine: Up to date ?Tdap vaccine: Done 10/26/16 repeat every 10 years  ?Shingles vaccine: Completed 1/31, 10/23/18   ?Covid-19: Completed 3/11, 4/1, 02/07/20 ? ?Advanced directives: Copies in chart ? ?Conditions/risks identified: Stay healthy ? ?Next appointment: Follow up in one year for your annual wellness visit.  ? ?Preventive Care 32 Years and Older, Male ?Preventive care refers to lifestyle choices and visits with your health care provider that can promote health and wellness. ?What does preventive care include? ?A yearly physical exam. This is also called an annual well check. ?Dental exams once or twice a year. ?Routine eye exams. Ask your health care provider how often you should have your eyes checked. ?Personal lifestyle choices, including: ?Daily care of your teeth and gums. ?Regular physical activity. ?Eating a healthy diet. ?Avoiding tobacco and drug use. ?Limiting alcohol use. ?Practicing safe sex. ?Taking low doses of aspirin every day. ?Taking vitamin and mineral supplements as recommended by your health care provider. ?What happens during an annual well check? ?The services and screenings done by your health care provider during your annual well check will depend on your age, overall health, lifestyle risk factors, and family history of disease. ?Counseling  ?Your health care provider may ask you questions about your: ?Alcohol use. ?Tobacco use. ?Drug use. ?Emotional well-being. ?Home and  relationship well-being. ?Sexual activity. ?Eating habits. ?History of falls. ?Memory and ability to understand (cognition). ?Work and work Statistician. ?Screening  ?You may have the following tests or measurements: ?Height, weight, and BMI. ?Blood pressure. ?Lipid and cholesterol levels. These may be checked every 5 years, or more frequently if you are over 29 years old. ?Skin check. ?Lung cancer screening. You may have this screening every year starting at age 76 if you have a 30-pack-year history of smoking and currently smoke or have quit within the past 15 years. ?Fecal occult blood test (FOBT) of the stool. You may have this test every year starting at age 34. ?Flexible sigmoidoscopy or colonoscopy. You may have a sigmoidoscopy every 5 years or a colonoscopy every 10 years starting at age 39. ?Prostate cancer screening. Recommendations will vary depending on your family history and other risks. ?Hepatitis C blood test. ?Hepatitis B blood test. ?Sexually transmitted disease (STD) testing. ?Diabetes screening. This is done by checking your blood sugar (glucose) after you have not eaten for a while (fasting). You may have this done every 1-3 years. ?Abdominal aortic aneurysm (AAA) screening. You may need this if you are a current or former smoker. ?Osteoporosis. You may be screened starting at age 40 if you are at high risk. ?Talk with your health care provider about your test results, treatment options, and if necessary, the need for more tests. ?Vaccines  ?Your health care provider may recommend certain vaccines, such as: ?Influenza vaccine. This is recommended every year. ?Tetanus, diphtheria, and acellular pertussis (Tdap, Td) vaccine. You may need a Td booster every 10 years. ?Zoster vaccine. You may need this after age 52. ?Pneumococcal 13-valent conjugate (PCV13) vaccine. One dose is recommended after age 9. ?Pneumococcal polysaccharide (PPSV23)  vaccine. One dose is recommended after age 73. ?Talk to your  health care provider about which screenings and vaccines you need and how often you need them. ?This information is not intended to replace advice given to you by your health care provider. Make sure you discuss any questions you have with your health care provider. ?Document Released: 05/20/2015 Document Revised: 01/11/2016 Document Reviewed: 02/22/2015 ?Elsevier Interactive Patient Education ? 2017 [place]. ? ?Fall Prevention in the Home ?Falls can cause injuries. They can happen to people of all ages. There are many things you can do to make your home safe and to help prevent falls. ?What can I do on the outside of my home? ?Regularly fix the edges of walkways and driveways and fix any cracks. ?Remove anything that might make you trip as you walk through a door, such as a raised step or threshold. ?Trim any bushes or trees on the path to your home. ?Use bright outdoor lighting. ?Clear any walking paths of anything that might make someone trip, such as rocks or tools. ?Regularly check to see if handrails are loose or broken. Make sure that both sides of any steps have handrails. ?Any raised decks and porches should have guardrails on the edges. ?Have any leaves, snow, or ice cleared regularly. ?Use sand or salt on walking paths during winter. ?Clean up any spills in your garage right away. This includes oil or grease spills. ?What can I do in the bathroom? ?Use night lights. ?Install grab bars by the toilet and in the tub and shower. Do not use towel bars as grab bars. ?Use non-skid mats or decals in the tub or shower. ?If you need to sit down in the shower, use a plastic, non-slip stool. ?Keep the floor dry. Clean up any water that spills on the floor as soon as it happens. ?Remove soap buildup in the tub or shower regularly. ?Attach bath mats securely with double-sided non-slip rug tape. ?Do not have throw rugs and other things on the floor that can make you trip. ?What can I do in the bedroom? ?Use night  lights. ?Make sure that you have a light by your bed that is easy to reach. ?Do not use any sheets or blankets that are too big for your bed. They should not hang down onto the floor. ?Have a firm chair that has side arms. You can use this for support while you get dressed. ?Do not have throw rugs and other things on the floor that can make you trip. ?What can I do in the kitchen? ?Clean up any spills right away. ?Avoid walking on wet floors. ?Keep items that you use a lot in easy-to-reach places. ?If you need to reach something above you, use a strong step stool that has a grab bar. ?Keep electrical cords out of the way. ?Do not use floor polish or wax that makes floors slippery. If you must use wax, use non-skid floor wax. ?Do not have throw rugs and other things on the floor that can make you trip. ?What can I do with my stairs? ?Do not leave any items on the stairs. ?Make sure that there are handrails on both sides of the stairs and use them. Fix handrails that are broken or loose. Make sure that handrails are as long as the stairways. ?Check any carpeting to make sure that it is firmly attached to the stairs. Fix any carpet that is loose or worn. ?Avoid having throw rugs at the top or bottom  of the stairs. If you do have throw rugs, attach them to the floor with carpet tape. ?Make sure that you have a light switch at the top of the stairs and the bottom of the stairs. If you do not have them, ask someone to add them for you. ?What else can I do to help prevent falls? ?Wear shoes that: ?Do not have high heels. ?Have rubber bottoms. ?Are comfortable and fit you well. ?Are closed at the toe. Do not wear sandals. ?If you use a stepladder: ?Make sure that it is fully opened. Do not climb a closed stepladder. ?Make sure that both sides of the stepladder are locked into place. ?Ask someone to hold it for you, if possible. ?Clearly mark and make sure that you can see: ?Any grab bars or handrails. ?First and last  steps. ?Where the edge of each step is. ?Use tools that help you move around (mobility aids) if they are needed. These include: ?Canes. ?Walkers. ?Scooters. ?Crutches. ?Turn on the lights when you go into a da

## 2021-09-11 DIAGNOSIS — Z87442 Personal history of urinary calculi: Secondary | ICD-10-CM | POA: Diagnosis not present

## 2021-09-11 DIAGNOSIS — Z8546 Personal history of malignant neoplasm of prostate: Secondary | ICD-10-CM | POA: Diagnosis not present

## 2021-09-11 DIAGNOSIS — N5231 Erectile dysfunction following radical prostatectomy: Secondary | ICD-10-CM | POA: Diagnosis not present

## 2021-09-12 ENCOUNTER — Other Ambulatory Visit: Payer: Self-pay | Admitting: Cardiology

## 2021-09-12 ENCOUNTER — Other Ambulatory Visit (HOSPITAL_BASED_OUTPATIENT_CLINIC_OR_DEPARTMENT_OTHER): Payer: Self-pay

## 2021-09-12 ENCOUNTER — Other Ambulatory Visit: Payer: Self-pay | Admitting: Family Medicine

## 2021-09-13 ENCOUNTER — Other Ambulatory Visit (HOSPITAL_BASED_OUTPATIENT_CLINIC_OR_DEPARTMENT_OTHER): Payer: Self-pay

## 2021-09-15 ENCOUNTER — Other Ambulatory Visit (HOSPITAL_BASED_OUTPATIENT_CLINIC_OR_DEPARTMENT_OTHER): Payer: Self-pay

## 2021-09-15 ENCOUNTER — Emergency Department (HOSPITAL_BASED_OUTPATIENT_CLINIC_OR_DEPARTMENT_OTHER): Payer: 59

## 2021-09-15 ENCOUNTER — Other Ambulatory Visit: Payer: Self-pay

## 2021-09-15 ENCOUNTER — Encounter (HOSPITAL_BASED_OUTPATIENT_CLINIC_OR_DEPARTMENT_OTHER): Payer: Self-pay

## 2021-09-15 ENCOUNTER — Emergency Department (HOSPITAL_BASED_OUTPATIENT_CLINIC_OR_DEPARTMENT_OTHER)
Admission: EM | Admit: 2021-09-15 | Discharge: 2021-09-15 | Disposition: A | Discharge status: Home / Self Care | Payer: 59 | Attending: Emergency Medicine | Admitting: Emergency Medicine

## 2021-09-15 DIAGNOSIS — M542 Cervicalgia: Secondary | ICD-10-CM

## 2021-09-15 DIAGNOSIS — R519 Headache, unspecified: Secondary | ICD-10-CM | POA: Diagnosis not present

## 2021-09-15 DIAGNOSIS — Z8546 Personal history of malignant neoplasm of prostate: Secondary | ICD-10-CM | POA: Insufficient documentation

## 2021-09-15 DIAGNOSIS — M47812 Spondylosis without myelopathy or radiculopathy, cervical region: Secondary | ICD-10-CM

## 2021-09-15 DIAGNOSIS — M47892 Other spondylosis, cervical region: Secondary | ICD-10-CM | POA: Diagnosis not present

## 2021-09-15 DIAGNOSIS — I10 Essential (primary) hypertension: Secondary | ICD-10-CM | POA: Diagnosis not present

## 2021-09-15 MED ORDER — CYCLOBENZAPRINE HCL 5 MG PO TABS
5.0000 mg | ORAL_TABLET | Freq: Two times a day (BID) | ORAL | 0 refills | Status: DC | PRN
  Filled 2021-09-15: qty 20, 10d supply, fill #0

## 2021-09-15 MED ORDER — LIDOCAINE 5 % EX PTCH
1.0000 | MEDICATED_PATCH | CUTANEOUS | Status: DC
  Administered 2021-09-15: 1 via TRANSDERMAL
  Filled 2021-09-15: qty 1

## 2021-09-15 NOTE — ED Triage Notes (Signed)
Pt c/o headache in the back of the head that's off and on X 1 month. Pt states he has  "sore" spot on the right side of his head behind the right ear. Pt denies N/V/dizziness or sensitivity. NAD in triage, VSS. Pt ambulatory to room 10 with steady gate.  ?

## 2021-09-15 NOTE — ED Provider Notes (Signed)
?[facility]PART[facility] ?Provider Note ? ? ?CSN: [id number] ?Arrival date & time: 09/15/21  6834 ? ?  ? ?History ? ?Chief Complaint  ?Patient presents with  ? Headache  ? ? ?William Montgomery is a 81 y.o. male with a past medical history of hypertension, prostate cancer in remission presenting to the ED with a chief complaint of headache.  Reports for the past several months he has had pain at the base of his skull on the right side.  Typically he is able to manage his pain with taking Tylenol.  However for the past week he has noticed that the pain has gotten worse to the point any type of palpation or laying down to sleep at night causes discomfort.  He is still able to take Tylenol but does not notice much of a difference lately.  He denies any injury or trauma that may have caused this pain.  He denies any fever, ear pain, neck stiffness, rash, prior neck surgeries, numbness, weakness, paresthesias, changes to gait, vision changes or chest pain. ? ? ?Headache ?Associated symptoms: no abdominal pain, no cough, no diarrhea, no dizziness, no ear pain, no fever, no myalgias, no nausea, no photophobia, no sore throat, no vomiting and no weakness   ? ?  ? ?Home Medications ?Prior to Admission medications   ?Medication Sig Start Date End Date Taking? Authorizing Provider  ?cyclobenzaprine (FLEXERIL) 5 MG tablet Take 1 tablet (5 mg total) by mouth 2 (two) times daily as needed for muscle spasms. 09/15/21  Yes Gareth Morgan, MD  ?niacin 100 MG tablet Take by mouth.   Yes [provider]  ?Cholecalciferol (VITAMIN D3) 5000 units CAPS Take 5,000 Units every evening by mouth.    [provider]  ?Coenzyme Q10 (COQ10) 100 MG CAPS Take 100 mg by mouth every evening.    [provider]  ?Multiple Vitamin (MULTIVITAMIN) tablet Take 1 tablet by mouth daily.    [provider]  ?Multiple Vitamins-Minerals (ICAPS AREDS 2 PO)     [provider]  ?   ? ?Allergies     ?Ace inhibitors and Imdur [isosorbide dinitrate]   ? ?Review of Systems   ?Review of Systems  ?Constitutional:  Negative for appetite change, chills and fever.  ?HENT:  Negative for ear pain, rhinorrhea, sneezing and sore throat.   ?Eyes:  Negative for photophobia and visual disturbance.  ?Respiratory:  Negative for cough, chest tightness, shortness of breath and wheezing.   ?Cardiovascular:  Negative for chest pain and palpitations.  ?Gastrointestinal:  Negative for abdominal pain, blood in stool, constipation, diarrhea, nausea and vomiting.  ?Genitourinary:  Negative for dysuria, hematuria and urgency.  ?Musculoskeletal:  Negative for myalgias.  ?Skin:  Negative for rash.  ?Neurological:  Positive for headaches. Negative for dizziness, weakness and light-headedness.  ? ?Physical Exam ?Updated Vital Signs ?BP 124/75   Pulse (!) 53   Temp (!) 97.5 ?F (36.4 ?C) (Oral)   Resp 14   Ht 5' 9"  (1.753 m)   Wt 70.3 kg   SpO2 95%   BMI 22.89 kg/m?  ?Physical Exam ?Vitals and nursing note reviewed.  ?Constitutional:   ?   General: He is not in acute distress. ?   Appearance: He is well-developed.  ?HENT:  ?   Head: Normocephalic and atraumatic.  ? ?   Comments: Focal tenderness in the indicated area without overlying skin changes or midline tenderness. ?   Nose: Nose normal.  ?Eyes:  ?  General: No scleral icterus.    ?   Left eye: No discharge.  ?   Conjunctiva/sclera: Conjunctivae normal.  ?Cardiovascular:  ?   Rate and Rhythm: Normal rate and regular rhythm.  ?   Heart sounds: Normal heart sounds. No murmur heard. ?  No friction rub. No gallop.  ?Pulmonary:  ?   Effort: Pulmonary effort is normal. No respiratory distress.  ?   Breath sounds: Normal breath sounds.  ?Abdominal:  ?   General: Bowel sounds are normal. There is no distension.  ?   Palpations: Abdomen is soft.  ?   Tenderness: There is no abdominal tenderness. There is no guarding.  ?Musculoskeletal:     ?   General: Normal range of motion.  ?    Cervical back: Normal range of motion and neck supple.  ?Skin: ?   General: Skin is warm and dry.  ?   Findings: No rash.  ?Neurological:  ?   General: No focal deficit present.  ?   Mental Status: He is alert and oriented to person, place, and time.  ?   Cranial Nerves: No cranial nerve deficit.  ?   Sensory: No sensory deficit.  ?   Motor: No weakness or abnormal muscle tone.  ?   Coordination: Coordination normal.  ?   Comments: Pupils reactive. No facial asymmetry noted. Cranial nerves appear grossly intact. Sensation intact to light touch on face, BUE and BLE. Strength 5/5 in BUE and BLE. Normal coordination.  ? ? ?ED Results / Procedures / Treatments   ?Labs ?(all labs ordered are listed, but only abnormal results are displayed) ?Labs Reviewed - No data to display ? ?EKG ?None ? ?Radiology ?CT Head Wo Contrast ? ?Result Date: 09/15/2021 ?CLINICAL DATA:  Headache EXAM: CT HEAD WITHOUT CONTRAST TECHNIQUE: Contiguous axial images were obtained from the base of the skull through the vertex without intravenous contrast. RADIATION DOSE REDUCTION: This exam was performed according to the departmental dose-optimization program which includes automated exposure control, adjustment of the mA and/or kV according to patient size and/or use of iterative reconstruction technique. COMPARISON:  None Available. FINDINGS: Brain: No evidence of acute infarction, hemorrhage, hydrocephalus, extra-axial collection or mass lesion/mass effect. Mild periventricular and deep white matter hypodensity. Vascular: No hyperdense vessel or unexpected calcification. Skull: Normal. Negative for fracture or focal lesion. Sinuses/Orbits: No acute finding. Other: None. IMPRESSION: No acute intracranial pathology.  Small-vessel white matter disease. Electronically Signed   By: Delanna Ahmadi M.D.   On: 09/15/2021 10:28  ? ?CT Cervical Spine Wo Contrast ? ?Result Date: 09/15/2021 ?CLINICAL DATA:  Neck pain, acute. Patient complains of headache in the  back of the head, that is off and on since 1 month with a sore spot on the right side of the head behind the right ear. EXAM: CT CERVICAL SPINE WITHOUT CONTRAST TECHNIQUE: Multidetector CT imaging of the cervical spine was performed without intravenous contrast. Multiplanar CT image reconstructions were also generated. RADIATION DOSE REDUCTION: This exam was performed according to the departmental dose-optimization program which includes automated exposure control, adjustment of the mA and/or kV according to patient size and/or use of iterative reconstruction technique. COMPARISON:  None Available. FINDINGS: Alignment: There is absence of the normal lordotic curvature of the C-spine seen. There is mild 2 mm anterolisthesis of C3 on C4 and C4 on C5 as well as C7 on T1 seen. Skull base and vertebrae: No fracture or dislocation. There is fusion of C6-C7 vertebrae seen. Severe cervical spondylosis  with prominent marginal osteophytes, loss of the disc space at C5-C6 and C7-T1. Severe facet hypertrophic changes throughout the cervical spine. At C2-C3, there is moderate left facet hypertrophic changes seen. At C3-C4, posterior osteophytic ridge and severe hypertrophic changes cause moderate narrowing of the neural foramina. At C4-C5, severe posterior osteophyte ridge/uncovertebral hypertrophic changes and severe facet hypertrophic changes greater on the right cause severe right and moderately severe left narrowing of the neural foramina. At C5-C6, prominent posterior osteophyte ridge likely abuts the ventral cervical cord. At C6-C7, no significant spinal canal or neural foraminal narrowing. At C7-T1, no significant spinal canal or neural foraminal narrowing. Soft tissues and spinal canal: Prevertebral soft tissues are unremarkable. Disc levels: Loss of the disc space at C5-C6 and disc fusion at C6-C7. Upper chest: Pleural pulmonary scarring at the right pulmonary apex. Other: None. IMPRESSION: 1. 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

## 2021-09-18 ENCOUNTER — Encounter: Payer: Self-pay | Admitting: Family Medicine

## 2021-09-22 ENCOUNTER — Ambulatory Visit: Payer: 59 | Admitting: Family Medicine

## 2021-10-03 DIAGNOSIS — M542 Cervicalgia: Secondary | ICD-10-CM | POA: Diagnosis not present

## 2021-10-09 DIAGNOSIS — H2513 Age-related nuclear cataract, bilateral: Secondary | ICD-10-CM | POA: Diagnosis not present

## 2021-10-09 DIAGNOSIS — H52223 Regular astigmatism, bilateral: Secondary | ICD-10-CM | POA: Diagnosis not present

## 2021-10-09 DIAGNOSIS — H40013 Open angle with borderline findings, low risk, bilateral: Secondary | ICD-10-CM | POA: Diagnosis not present

## 2021-10-09 DIAGNOSIS — H5203 Hypermetropia, bilateral: Secondary | ICD-10-CM | POA: Diagnosis not present

## 2021-10-09 DIAGNOSIS — H353131 Nonexudative age-related macular degeneration, bilateral, early dry stage: Secondary | ICD-10-CM | POA: Diagnosis not present

## 2021-10-09 DIAGNOSIS — H524 Presbyopia: Secondary | ICD-10-CM | POA: Diagnosis not present

## 2021-10-17 DIAGNOSIS — M542 Cervicalgia: Secondary | ICD-10-CM | POA: Diagnosis not present

## 2021-10-17 DIAGNOSIS — R293 Abnormal posture: Secondary | ICD-10-CM | POA: Diagnosis not present

## 2021-10-18 ENCOUNTER — Ambulatory Visit (INDEPENDENT_AMBULATORY_CARE_PROVIDER_SITE_OTHER): Payer: 59 | Admitting: Family Medicine

## 2021-10-18 ENCOUNTER — Encounter: Payer: Self-pay | Admitting: Family Medicine

## 2021-10-18 VITALS — BP 128/73 | HR 68 | Temp 97.7°F | Ht 69.0 in | Wt 160.2 lb

## 2021-10-18 DIAGNOSIS — M47812 Spondylosis without myelopathy or radiculopathy, cervical region: Secondary | ICD-10-CM

## 2021-10-18 DIAGNOSIS — I2510 Atherosclerotic heart disease of native coronary artery without angina pectoris: Secondary | ICD-10-CM

## 2021-10-18 DIAGNOSIS — G8929 Other chronic pain: Secondary | ICD-10-CM | POA: Diagnosis not present

## 2021-10-18 DIAGNOSIS — M542 Cervicalgia: Secondary | ICD-10-CM

## 2021-10-18 DIAGNOSIS — R9082 White matter disease, unspecified: Secondary | ICD-10-CM | POA: Diagnosis not present

## 2021-10-18 NOTE — Progress Notes (Signed)
OFFICE VISIT  10/18/2021  CC:  Chief Complaint  Patient presents with   Headache    Not using any otc meds; did not pick up or take flexeril prescribed during ED visit   Neck Ache    Patient is a 81 y.o. male who presents for headaches, neck ache: emergency dept f/u  HPI: Patient presented to [place] emergency department 09/15/2021 with persistent headache. Exam was remarkable for pinpoint tenderness in the right occipital area.  CT head imaging unremarkable.  CT neck imaging showed severe spondylosis. It was recommended that he see a neurosurgeon.  He was treated with muscle relaxer and lidocaine patch. He does have a prior history of neck surgery: C6-7 fusion.  Currently: His pain is not as severe as when he went to the ED. He is tolerating it.  Rarely takes Soma half of the 350 mg tab and this helps some. He has seen Dr. Ronnald Ramp with [place] [facility] and reports that the plan is to do PT as the initial step. His does have a bit more focused pain on the right in the sternocleidomastoid region.  Denies tingling or numbness anywhere.  He is concerned about the comment on the CT brain report stating "white matter disease". He wondered if he should see a neurologist.  CT cervical spine without contrast on 09/15/21 "IMPRESSION: 1. Severe cervical spondylosis with prominent marginal osteophytes, loss of the disc space at C5-C6, disc fusion at C6-C7, posterior osteophyte ridge and facet hypertrophic changes cause severe right and moderately severe left narrowing of the neural foramina at C4-C5.  2.  Mild anterolisthesis of C3 on C4, C4 on C5 and C7 on T1".   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  No further colon ca screening indicated as of 12/2018 GI eval (Dr. Loletha Carrow).   Coronary artery disease 12/22/2007   Cath 03/2017-->  70 - 80% mid/distal circ stenosis and a small D1 80% stenosis and proximal stents in the prox and mid LAD.   Med mgmt rec'd-->nitrates caused HAs, ACE-I caused cough.   DDD (degenerative disc disease)    s/p surgery; L/S spine MRI 05/2004 showed DDD/spondylosis with right L3 nerve root abutment, with L5/S1 surgical changes.   Diverticulosis of sigmoid colon 07/25/2011   Severe (endoscopy by Dr. Sharlett Iles)   Erectile dysfunction    Gallbladder polyp 2013   Noted 06/2011.  Stable on u/s f/u 10/2011.   Gallstone 05/2018   Asymptomatic (picked up on CT abd/pelv).   Hearing loss 2004   Dr. Tamala Julian, ENT   History of prostate cancer 12/2014   Robot assisted radical prostatectomy 07/2015: lymph node involvement.  Plan is active surveillance as per urology: As of 05/2020 PSA undetectable.   HTN (hypertension)    ACE-I cough.  Fine on ARB.   Hyperlipidemia    NASH (nonalcoholic steatohepatitis) 06/2011   Hep B and C testing neg.  Mild fatty liver on CT 2020   Pulmonary nodules    small, picked up on CT ab/pelv for abd pains/flank pains-->pt quit smoking 40 yrs ago.  Low risk for lung ca so no f/u CT needed.   Tinnitus    Ureterolithiasis  8 mm left distal ureteral stone 05/2018, got ureteroscopy with holmium laser treatment.    Past Surgical History:  Procedure Laterality Date   APPENDECTOMY  1953   BACK SURGERY     C-Spine and L-Spine   CARDIAC CATHETERIZATION     CARDIOVASCULAR STRESS TEST  2012   Normal   COLONOSCOPY  02/26/02; 07/2011   Colonoscopy by Dr. Rowe Pavy 2003 was normal (+hx of polyps (adenomatous?) prior), 07/2011 showed one hyperplastic polyp. NO FURTHER SCREENING COLONOSCOPIES INDICTAED as of 12/2018 GI eval.   CORONARY STENT PLACEMENT     x 2 (LAD)   CYSTOSCOPY WITH RETROGRADE PYELOGRAM, URETEROSCOPY AND STENT PLACEMENT Left 05/10/2018   Procedure:  CYSTOSCOPY WITH LEFT RETROGRADE PYELOGRAM, LEFT URETEROSCOPY AND LEFT URETERAL STENT PLACEMENT;  Surgeon: Alexis Frock, MD;  Location: [facility];  Service: Urology;  Laterality: Left;   HOLMIUM LASER APPLICATION Left 05/15/2991   Procedure: HOLMIUM LASER APPLICATION;  Surgeon: Alexis Frock, MD;  [facility]: [facility];  Service: Urology;  Laterality: Left;   INCISIONAL HERNIA REPAIR N/A 03/19/2019   Procedure: LAPAROSCOPIC INCISIONAL HERNIA REPAIR WITH MESH, RECURRENT UMBILICAL HERNIA REPAIR;  Surgeon: Michael Boston, MD;  [facility];  Service: General;  Laterality: N/A;   INGUINAL HERNIA REPAIR Right 03/19/2019   Procedure: LAPAROSCOPIC BILATERAL FEMORAL AND INGUINAL HERNIA REPAIR WITH MESH, LYSIS OF ADHESIONS;  Surgeon: Michael Boston, MD;  [facility];  Service: General;  Laterality: Right;   LEFT HEART CATH AND CORONARY ANGIOGRAPHY N/A 03/21/2017   Stents patent; mod circ dz, nothing for intervention--imdur added.  Procedure: LEFT HEART CATH AND CORONARY ANGIOGRAPHY;  Surgeon: Nelva Bush, MD;  Location: [facility];  Service: Cardiovascular;  Laterality: N/A;   LOBECTOMY     right upper lobectomy for blastomycosis   LYMPHADENECTOMY Bilateral 07/14/2015   Procedure: LYMPHADENECTOMY;  Surgeon: Raynelle Bring, MD;  [facility];  Service: Urology;  Laterality: Bilateral;   PROSTATE BIOPSY  04/27/15   4 of 12 bx core's + prostate adenocarcinoma (tentative treatment plan is prostatectomy ? + other therapies (as of 05/18/15)   [place] N/A 07/14/2015   One positive pelvic LN.  Procedure: XI ROBOTIC ASSISTED LAPAROSCOPIC RADICAL PROSTATECTOMY LEVEL 2;  Surgeon: Raynelle Bring, MD;  [facility];  Service: Urology;  Laterality: N/A;   UMBILICAL HERNIA REPAIR  07/14/2015   Procedure: HERNIA REPAIR UMBILICAL ADULT;  Surgeon: Raynelle Bring, MD;  [facility];  Service: Urology;;    Outpatient Medications Prior to Visit  Medication Sig  Dispense Refill   carisoprodol (SOMA) 350 MG tablet Take 350 mg by mouth 4 (four) times daily as needed for muscle spasms. 1 bid prn     Cholecalciferol (VITAMIN D3) 5000 units CAPS Take 5,000 Units every evening by mouth.     Coenzyme Q10 (COQ10) 100 MG CAPS Take 100 mg by mouth every evening.     Multiple Vitamin (MULTIVITAMIN) tablet Take 1 tablet by mouth daily.     Multiple Vitamins-Minerals (ICAPS AREDS 2 PO)      niacin 100 MG tablet Take by mouth.     cyclobenzaprine (FLEXERIL) 5 MG tablet Take 1 tablet (5 mg total) by mouth 2 (two) times daily as needed for muscle spasms. 20 tablet 0   No facility-administered medications prior to visit.    Allergies  Allergen Reactions   Ace Inhibitors Cough   Imdur [Isosorbide Dinitrate] Other (See Comments)    Headache    ROS As per HPI  PE:  10/18/2021   10:47 AM 09/15/2021   11:30 AM 09/15/2021   10:00 AM  Vitals with BMI  Height 5' 9"     Weight 160 lbs 3 oz    BMI 42.70    Systolic [phone]  Diastolic 73 82 75  Pulse 68 74 53    Physical Exam  Gen: Alert, well appearing.  Patient is oriented to person, place, time, and situation. AFFECT: pleasant, lucid thought and speech. Neck: mild TTP over R SCM muscle, with mild increase in neck pain with resisted rotation of head to the right. No tenderness over occiput. Neuro: CN 2-12 intact bilaterally, strength 5/5 in proximal and distal upper extremities and lower extremities bilaterally.  No tremor.    No ataxia.  UNo pronator drift.   LABS:  Last CBC Lab Results  Component Value Date   WBC 3.2 (L) 07/10/2021   HGB 15.2 07/10/2021   HCT 45.1 07/10/2021   MCV 100.5 (H) 07/10/2021   MCH 32.3 06/03/2019   RDW 14.6 07/10/2021   PLT 133.0 (L) 51/76/1607   Last metabolic panel Lab Results  Component Value Date   GLUCOSE 84 07/10/2021   NA 138 07/10/2021   K 4.1 07/10/2021   CL 102 07/10/2021   CO2 27 07/10/2021   BUN 16 07/10/2021   CREATININE 0.92 07/10/2021    GFRNONAA >60 03/16/2019   CALCIUM 9.0 07/10/2021   PROT 6.9 07/10/2021   ALBUMIN 4.6 07/10/2021   BILITOT 0.4 07/10/2021   ALKPHOS 77 07/10/2021   AST 19 07/10/2021   ALT 16 07/10/2021   ANIONGAP 7 03/16/2019   IMPRESSION AND PLAN:  #1 progressive neck pain, cervical spondylosis. He is to start PT soon, is followed by Dr. Ronnald Ramp in neurosurgery. He will continue occasional use of Soma.  He did not need a new prescription for this today.  2.  Small vessel white matter dz changes on recent CT scan. Reassured patient that neurologist consultation not needed. Discussed the nature of this finding today.  Emphasized the need to minimize risk factors for progression.  An After Visit Summary was printed and given to the patient.  FOLLOW UP: Return for as needed.  Signed:  Crissie Sickles, MD           10/18/2021

## 2021-10-20 ENCOUNTER — Other Ambulatory Visit (HOSPITAL_BASED_OUTPATIENT_CLINIC_OR_DEPARTMENT_OTHER): Payer: Self-pay

## 2021-10-20 ENCOUNTER — Encounter: Payer: Self-pay | Admitting: Family Medicine

## 2021-10-20 DIAGNOSIS — R293 Abnormal posture: Secondary | ICD-10-CM | POA: Diagnosis not present

## 2021-10-20 DIAGNOSIS — M542 Cervicalgia: Secondary | ICD-10-CM | POA: Diagnosis not present

## 2021-10-20 MED ORDER — CARISOPRODOL 350 MG PO TABS
350.0000 mg | ORAL_TABLET | Freq: Four times a day (QID) | ORAL | 1 refills | Status: DC | PRN
  Filled 2021-10-20: qty 60, 15d supply, fill #0
  Filled 2021-12-27: qty 60, 15d supply, fill #1

## 2021-10-20 MED ORDER — NITROGLYCERIN 0.4 MG SL SUBL
0.4000 mg | SUBLINGUAL_TABLET | SUBLINGUAL | 3 refills | Status: DC | PRN
  Filled 2021-10-20: qty 25, 10d supply, fill #0
  Filled 2022-07-30: qty 25, 10d supply, fill #1

## 2021-10-20 NOTE — Telephone Encounter (Signed)
I apologize, this is my fault. I sent the rx's just now.

## 2021-10-20 NOTE — Telephone Encounter (Signed)
Pt requesting refills, Please fill, if appropriate.

## 2021-10-23 DIAGNOSIS — M542 Cervicalgia: Secondary | ICD-10-CM | POA: Diagnosis not present

## 2021-10-23 DIAGNOSIS — R293 Abnormal posture: Secondary | ICD-10-CM | POA: Diagnosis not present

## 2021-10-25 DIAGNOSIS — R293 Abnormal posture: Secondary | ICD-10-CM | POA: Diagnosis not present

## 2021-10-25 DIAGNOSIS — M542 Cervicalgia: Secondary | ICD-10-CM | POA: Diagnosis not present

## 2021-11-03 ENCOUNTER — Telehealth: Payer: Self-pay | Admitting: *Deleted

## 2021-11-03 ENCOUNTER — Telehealth: Payer: Self-pay | Admitting: Cardiology

## 2021-11-03 ENCOUNTER — Other Ambulatory Visit: Payer: Self-pay | Admitting: Urology

## 2021-11-03 NOTE — Telephone Encounter (Signed)
   Pre-operative Risk Assessment    Patient Name: William Montgomery  DOB: 1941/04/27 MRN: [id number]      Request for Surgical Clearance    Procedure:   insertion of inflatable penile prosthesis  Date of Surgery:  Clearance 12/05/21                                 Surgeon:  Dr. Lutricia Feil Group or Practice Name:  [facility] Phone number:  [phone] ext [place] Fax number:  [phone]   Type of Clearance Requested:   - Medical    Type of Anesthesia:  General    Additional requests/questions:      Marquita Palms   11/03/2021, 11:31 AM

## 2021-11-03 NOTE — Telephone Encounter (Signed)
   Name: William Montgomery  DOB: 04-Oct-1940  MRN: [id number]  Primary Cardiologist: Minus Breeding, MD  Chart reviewed as part of pre-operative protocol coverage. Because of [name]'s past medical history and time since last visit, he will require a follow-up tele visit in order to better assess preoperative cardiovascular risk.  Pre-op covering staff: - Please schedule appointment and call patient to inform them. If patient already had an upcoming appointment within acceptable timeframe, please add "pre-op clearance" to the appointment notes so provider is aware. - Please contact requesting surgeon's office via preferred method (i.e, phone, fax) to inform them of need for appointment prior to surgery.  No pharmaceutical clearance was requested.  [name], PA-C  11/03/2021, 2:45 PM

## 2021-11-03 NOTE — Telephone Encounter (Signed)
Pt agreeable to plan of tele visit for pre op clearance. Med rec and consent are done.     Patient Consent for Virtual Visit        William Montgomery has provided verbal consent on 11/03/2021 for a virtual visit (video or telephone).   CONSENT FOR VIRTUAL VISIT FOR:  William Montgomery  By participating in this virtual visit I agree to the following:  I hereby voluntarily request, consent and authorize [facility] and its employed or contracted physicians, physician assistants, nurse practitioners or other licensed health care professionals (the Practitioner), to provide me with telemedicine health care services (the "Services") as deemed necessary by the treating Practitioner. I acknowledge and consent to receive the Services by the Practitioner via telemedicine. I understand that the telemedicine visit will involve communicating with the Practitioner through live audiovisual communication technology and the disclosure of certain medical information by electronic transmission. I acknowledge that I have been given the opportunity to request an in-person assessment or other available alternative prior to the telemedicine visit and am voluntarily participating in the telemedicine visit.  I understand that I have the right to withhold or withdraw my consent to the use of telemedicine in the course of my care at any time, without affecting my right to future care or treatment, and that the Practitioner or I may terminate the telemedicine visit at any time. I understand that I have the right to inspect all information obtained and/or recorded in the course of the telemedicine visit and may receive copies of available information for a reasonable fee.  I understand that some of the potential risks of receiving the Services via telemedicine include:  Delay or interruption in medical evaluation due to technological equipment failure or disruption; Information transmitted may not be sufficient (e.g. poor  resolution of images) to allow for appropriate medical decision making by the Practitioner; and/or  In rare instances, security protocols could fail, causing a breach of personal health information.  Furthermore, I acknowledge that it is my responsibility to provide information about my medical history, conditions and care that is complete and accurate to the best of my ability. I acknowledge that Practitioner's advice, recommendations, and/or decision may be based on factors not within their control, such as incomplete or inaccurate data provided by me or distortions of diagnostic images or specimens that may result from electronic transmissions. I understand that the practice of medicine is not an exact science and that Practitioner makes no warranties or guarantees regarding treatment outcomes. I acknowledge that a copy of this consent can be made available to me via my patient portal ([facility]), or I can request a printed copy by calling the office of [facility].    I understand that my insurance will be billed for this visit.   I have read or had this consent read to me. I understand the contents of this consent, which adequately explains the benefits and risks of the Services being provided via telemedicine.  I have been provided ample opportunity to ask questions regarding this consent and the Services and have had my questions answered to my satisfaction. I give my informed consent for the services to be provided through the use of telemedicine in my medical care

## 2021-11-03 NOTE — Telephone Encounter (Signed)
Pt agreeable to plan of tele visit for pre op clearance. Med rec and consent are done.

## 2021-11-09 ENCOUNTER — Ambulatory Visit (INDEPENDENT_AMBULATORY_CARE_PROVIDER_SITE_OTHER): Payer: 59 | Admitting: Nurse Practitioner

## 2021-11-09 DIAGNOSIS — Z01810 Encounter for preprocedural cardiovascular examination: Secondary | ICD-10-CM

## 2021-11-09 NOTE — Progress Notes (Signed)
Virtual Visit via Telephone Note   Because of William Montgomery's co-morbid illnesses, he is at least at moderate risk for complications without adequate follow up.  This format is felt to be most appropriate for this patient at this time.  The patient did not have access to video technology/had technical difficulties with video requiring transitioning to audio format only (telephone).  All issues noted in this document were discussed and addressed.  No physical exam could be performed with this format.  Please refer to the patient's chart for his consent to telehealth for [facility].  Evaluation Performed:  Preoperative cardiovascular risk assessment _____________   Date:  11/09/2021   Patient ID:  William Montgomery, DOB 04-24-1941, MRN [id number] Patient Location:  Home Provider location:   Office  Primary Care Provider:  Tammi Sou, MD Primary Cardiologist:  Minus Breeding, MD  Chief Complaint / Patient Profile   81 y.o. y/o male with a h/o CAD s/p DES-p-mLAD, pretension, hyperlipidemia, CKD stage II-III, DDD, and BPH who is pending insertion of inflatable penile prosthesis on 12/05/2021 with Dr. Cain Sieve of [facility] and presents today for telephonic preoperative cardiovascular risk assessment.  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 No further colon ca screening indicated as of 12/2018 GI eval (Dr. Loletha Carrow).   Coronary artery disease 12/22/2007   Cath 03/2017-->  70 - 80% mid/distal circ stenosis and a small D1 80% stenosis and proximal stents in the prox and mid LAD.   Med mgmt rec'd-->nitrates caused HAs, ACE-I caused cough.   DDD (degenerative disc disease)    s/p surgery;  L/S spine MRI 05/2004 showed DDD/spondylosis with right L3 nerve root abutment, with L5/S1 surgical changes.   Diverticulosis of sigmoid colon 07/25/2011   Severe (endoscopy by Dr. Sharlett Iles)   Erectile dysfunction    Gallbladder polyp 2013   Noted 06/2011.  Stable on u/s f/u 10/2011.   Gallstone 05/2018   Asymptomatic (picked up on CT abd/pelv).   Hearing loss 2004   Dr. Tamala Julian, ENT   History of prostate cancer 12/2014   Robot assisted radical prostatectomy 07/2015: lymph node involvement.  Plan is active surveillance as per urology: As of 05/2020 PSA undetectable.   HTN (hypertension)    ACE-I cough.  Fine on ARB.   Hyperlipidemia    NASH (nonalcoholic steatohepatitis) 06/2011   Hep B and C testing neg.  Mild fatty liver on CT 2020   Pulmonary nodules    small, picked up on CT ab/pelv for abd pains/flank pains-->pt quit smoking 40 yrs ago.  Low risk for lung ca so no f/u CT needed.   Tinnitus    Ureterolithiasis    8 mm left distal ureteral stone 05/2018, got ureteroscopy with holmium laser treatment.   Past Surgical History:  Procedure Laterality Date   APPENDECTOMY  1953   BACK SURGERY     C-Spine and L-Spine   CARDIAC CATHETERIZATION     CARDIOVASCULAR STRESS TEST  2012   Normal   COLONOSCOPY  02/26/02; 07/2011   Colonoscopy by Dr. Rowe Pavy 2003 was normal (+hx of polyps (adenomatous?) prior), 07/2011 showed one hyperplastic polyp. NO FURTHER  SCREENING COLONOSCOPIES INDICTAED as of 12/2018 GI eval.   CORONARY STENT PLACEMENT     x 2 (LAD)   CYSTOSCOPY WITH RETROGRADE PYELOGRAM, URETEROSCOPY AND STENT PLACEMENT Left 05/10/2018   Procedure: CYSTOSCOPY WITH LEFT RETROGRADE PYELOGRAM, LEFT URETEROSCOPY AND LEFT URETERAL STENT PLACEMENT;  Surgeon: Alexis Frock, MD;  [facility];  Service: Urology;  Laterality: Left;   HOLMIUM LASER APPLICATION Left 09/11/8467   Procedure: HOLMIUM LASER APPLICATION;  Surgeon: Alexis Frock, MD;  [facility];  Service: Urology;  Laterality: Left;    INCISIONAL HERNIA REPAIR N/A 03/19/2019   Procedure: LAPAROSCOPIC INCISIONAL HERNIA REPAIR WITH MESH, RECURRENT UMBILICAL HERNIA REPAIR;  Surgeon: Michael Boston, MD;  [facility];  Service: General;  Laterality: N/A;   INGUINAL HERNIA REPAIR Right 03/19/2019   Procedure: LAPAROSCOPIC BILATERAL FEMORAL AND INGUINAL HERNIA REPAIR WITH MESH, LYSIS OF ADHESIONS;  Surgeon: Michael Boston, MD;  [facility];  Service: General;  Laterality: Right;   LEFT HEART CATH AND CORONARY ANGIOGRAPHY N/A 03/21/2017   Stents patent; mod circ dz, nothing for intervention--imdur added.  Procedure: LEFT HEART CATH AND CORONARY ANGIOGRAPHY;  Surgeon: Nelva Bush, MD;  Location: [facility];  Service: Cardiovascular;  Laterality: N/A;   LOBECTOMY     right upper lobectomy for blastomycosis   LYMPHADENECTOMY Bilateral 07/14/2015   Procedure: LYMPHADENECTOMY;  Surgeon: Raynelle Bring, MD;  [facility];  Service: Urology;  Laterality: Bilateral;   PROSTATE BIOPSY  04/27/15   4 of 12 bx core's + prostate adenocarcinoma (tentative treatment plan is prostatectomy ? + other therapies (as of 05/18/15)   [place] N/A 07/14/2015   One positive pelvic LN.  Procedure: XI ROBOTIC ASSISTED LAPAROSCOPIC RADICAL PROSTATECTOMY LEVEL 2;  Surgeon: Raynelle Bring, MD;  [facility];  Service: Urology;  Laterality: N/A;   UMBILICAL HERNIA REPAIR  07/14/2015   Procedure: HERNIA REPAIR UMBILICAL ADULT;  Surgeon: Raynelle Bring, MD;  [facility];  Service: Urology;;    Allergies  Allergies  Allergen Reactions   Ace Inhibitors Cough   Imdur [Isosorbide Dinitrate] Other (See Comments)    Headache    History of Present Illness    William Montgomery is a 81 y.o. male who presents via audio/video conferencing for a telehealth visit today.  Pt was last seen in cardiology clinic on 03/10/2021 by Dr. Percival Spanish.  At that time BRILEY SULTON was doing well.  The patient is now pending  procedure as outlined above. Since his last visit, he has done well from a cardiac standpoint. He denies chest pain, palpitations, dyspnea, pnd, orthopnea, n, v, dizziness, syncope, edema, weight gain, or early satiety. All other systems reviewed and are otherwise negative except as noted above.   Home Medications    Prior to Admission medications   Medication Sig Start Date End Date Taking? Authorizing Provider  carisoprodol (SOMA) 350 MG tablet Take 1 tablet by mouth 4 times daily as needed for muscle spasms 10/20/21   McGowen, Adrian Blackwater, MD  Cholecalciferol (VITAMIN D3) 5000 units CAPS Take 5,000 Units every evening by mouth.    [provider]  Coenzyme Q10 (COQ10) 100 MG CAPS Take 100 mg by mouth every evening.    [provider]  Multiple Vitamin (MULTIVITAMIN) tablet Take 1 tablet by mouth daily.    [provider]  Multiple Vitamins-Minerals (ICAPS AREDS 2 PO)     [provider]  niacin 100 MG tablet Take by mouth.    [provider]  nitroGLYCERIN (NITROSTAT) 0.4 MG SL tablet Place 1 tablet (0.4 mg total) under the tongue every 5 (five) minutes as needed for chest pain. 10/20/21   Tammi Sou, MD    Physical Exam    Vital Signs:  ARCANGEL MINION does not have vital signs available for review today.  Given telephonic nature of communication, physical exam is limited. AAOx3. NAD. Normal affect.  Speech and respirations are unlabored.  Accessory Clinical Findings    None  Assessment & Plan    1.  Preoperative Cardiovascular Risk Assessment:  According to the Revised Cardiac Risk Index (RCRI), his Perioperative Risk of Major Cardiac Event is (%): 0.9. His Functional Capacity in METs is: 7.99 according to the [facility] Activity Status Index (DASI).Therefore, based on ACC/AHA guidelines, patient would be at acceptable risk for the planned procedure without further cardiovascular testing.   A copy of this note will be routed to requesting  surgeon.  Time:   Today, I have spent 4 minutes with the patient with telehealth technology discussing medical history, symptoms, and management plan.     Lenna Sciara, NP  11/09/2021, 1:08 PM

## 2021-11-13 ENCOUNTER — Other Ambulatory Visit (HOSPITAL_BASED_OUTPATIENT_CLINIC_OR_DEPARTMENT_OTHER): Payer: Self-pay

## 2021-11-14 DIAGNOSIS — M542 Cervicalgia: Secondary | ICD-10-CM | POA: Diagnosis not present

## 2021-11-15 ENCOUNTER — Other Ambulatory Visit (HOSPITAL_BASED_OUTPATIENT_CLINIC_OR_DEPARTMENT_OTHER): Payer: Self-pay

## 2021-11-15 MED ORDER — DIAZEPAM 5 MG PO TABS
ORAL_TABLET | ORAL | 0 refills | Status: DC
  Filled 2021-11-15: qty 1, 1d supply, fill #0

## 2021-11-16 DIAGNOSIS — M4312 Spondylolisthesis, cervical region: Secondary | ICD-10-CM | POA: Diagnosis not present

## 2021-11-16 DIAGNOSIS — M542 Cervicalgia: Secondary | ICD-10-CM | POA: Diagnosis not present

## 2021-11-27 ENCOUNTER — Encounter (HOSPITAL_BASED_OUTPATIENT_CLINIC_OR_DEPARTMENT_OTHER): Payer: Self-pay | Admitting: Urology

## 2021-11-27 DIAGNOSIS — Z8546 Personal history of malignant neoplasm of prostate: Secondary | ICD-10-CM | POA: Diagnosis not present

## 2021-11-27 DIAGNOSIS — N5201 Erectile dysfunction due to arterial insufficiency: Secondary | ICD-10-CM | POA: Diagnosis not present

## 2021-11-27 NOTE — Progress Notes (Signed)
Spoke w/ via phone for pre-op interview--- pt Lab needs dos---- [employer] results------ current ekg in epic/ chart COVID test -----patient states asymptomatic no test needed Arrive at ------- 0530 on 12-05-2021 NPO after MN NO Solid Food.  Clear liquids from MN until--- 0430 Med rec completed Medications to take morning of surgery ----- none Diabetic medication ----- n/a Patient instructed no nail polish to be worn day of surgery Patient instructed to bring photo id and insurance card day of surgery Patient aware to have Driver (ride ) / caregiver   for 24 hours after surgery -- wife, brenda Patient Special Instructions ----- reviewed [facility] / visitor guidelines.  Pt stated was given instructions to do hibiclens shower morning of surgery Pre-Op special Istructions ----- pt has cardiac telephone office visit clearance by Diona Browner NP on 11-09-2021 in epic/ chart Patient verbalized understanding of instructions that were given at this phone interview. Patient denies shortness of breath, chest pain, fever, cough at this phone interview.    Anesthesia Review: HTN; CAD s/p DES's 1999, 2009, and 2018 (total 6) w/ per last cath 11/ 2018 non-critical disease mid/ distal CFx and stable chronic D1 stenosis with no change since 2009;  s/p  RULobectomy for blastomycosis in 1978;  severe cervical spondylosis  Pt denies cardiac s&s, denies sob (per clearance note able to do >4 METs).  Pt stated last taken a nitro greater than 10 years.  PCP: Dr Anitra Lauth Cardiologist : Dr Percival Spanish (lov 03-10-2021 epic) Chest xray:  05-30-2017 EKG : 03-10-2021 Echo : no Stress test: ETT in 2012 Cardiac Cath : 03-21-2017 Activity level: see above  Blood Thinner/ Instructions /Last Dose: no ASA / Instructions/ Last Dose :  no

## 2021-11-29 DIAGNOSIS — M542 Cervicalgia: Secondary | ICD-10-CM | POA: Diagnosis not present

## 2021-12-04 MED ORDER — GENTAMICIN SULFATE 40 MG/ML IJ SOLN
5.0000 mg/kg | INTRAVENOUS | Status: DC
  Filled 2021-12-04 (×2): qty 8.75

## 2021-12-05 ENCOUNTER — Ambulatory Visit (HOSPITAL_BASED_OUTPATIENT_CLINIC_OR_DEPARTMENT_OTHER): Admission: RE | Admit: 2021-12-05 | Payer: Medicare Other | Source: Ambulatory Visit | Admitting: Urology

## 2021-12-05 DIAGNOSIS — Z01818 Encounter for other preprocedural examination: Secondary | ICD-10-CM

## 2021-12-05 HISTORY — DX: Other chronic pain: G89.29

## 2021-12-05 HISTORY — DX: Presence of external hearing-aid: Z97.4

## 2021-12-05 HISTORY — DX: Spondylolysis, cervical region: M43.02

## 2021-12-05 HISTORY — DX: Erectile dysfunction following radical prostatectomy: N52.31

## 2021-12-05 HISTORY — DX: Personal history of urinary calculi: Z87.442

## 2021-12-05 HISTORY — DX: Headache, unspecified: R51.9

## 2021-12-05 HISTORY — DX: Presence of spectacles and contact lenses: Z97.3

## 2021-12-05 HISTORY — DX: Nocturia: R35.1

## 2021-12-05 SURGERY — INSERTION, PENILE PROSTHESIS
Anesthesia: General

## 2021-12-27 ENCOUNTER — Other Ambulatory Visit (HOSPITAL_BASED_OUTPATIENT_CLINIC_OR_DEPARTMENT_OTHER): Payer: Self-pay

## 2021-12-27 DIAGNOSIS — M5412 Radiculopathy, cervical region: Secondary | ICD-10-CM | POA: Diagnosis not present

## 2022-01-11 ENCOUNTER — Encounter: Payer: Self-pay | Admitting: Family Medicine

## 2022-01-11 ENCOUNTER — Ambulatory Visit (INDEPENDENT_AMBULATORY_CARE_PROVIDER_SITE_OTHER): Payer: 59 | Admitting: Family Medicine

## 2022-01-11 VITALS — BP 121/73 | HR 69 | Temp 98.0°F | Ht 69.0 in | Wt 164.0 lb

## 2022-01-11 DIAGNOSIS — I2510 Atherosclerotic heart disease of native coronary artery without angina pectoris: Secondary | ICD-10-CM

## 2022-01-11 DIAGNOSIS — M5481 Occipital neuralgia: Secondary | ICD-10-CM | POA: Diagnosis not present

## 2022-01-11 DIAGNOSIS — E7800 Pure hypercholesterolemia, unspecified: Secondary | ICD-10-CM | POA: Diagnosis not present

## 2022-01-11 DIAGNOSIS — Z23 Encounter for immunization: Secondary | ICD-10-CM | POA: Diagnosis not present

## 2022-01-11 DIAGNOSIS — Z8679 Personal history of other diseases of the circulatory system: Secondary | ICD-10-CM | POA: Diagnosis not present

## 2022-01-11 DIAGNOSIS — N182 Chronic kidney disease, stage 2 (mild): Secondary | ICD-10-CM

## 2022-01-11 LAB — LIPID PANEL
Cholesterol: 194 mg/dL (ref 0–200)
HDL: 39.9 mg/dL (ref >39.00)
LDL Cholesterol: 120 mg/dL — ABNORMAL HIGH (ref 0–99)
NonHDL: 154.33
Total CHOL/HDL Ratio: 5
Triglycerides: 171 mg/dL — ABNORMAL HIGH (ref 0.0–149.0)
VLDL: 34.2 mg/dL (ref 0.0–40.0)

## 2022-01-11 LAB — COMPREHENSIVE METABOLIC PANEL
ALT: 30 U/L (ref 0–53)
AST: 25 U/L (ref 0–37)
Albumin: 4.6 g/dL (ref 3.5–5.2)
Alkaline Phosphatase: 71 U/L (ref 39–117)
BUN: 17 mg/dL (ref 6–23)
CO2: 28 mEq/L (ref 19–32)
Calcium: 9.4 mg/dL (ref 8.4–10.5)
Chloride: 104 mEq/L (ref 96–112)
Creatinine, Ser: 1.06 mg/dL (ref 0.40–1.50)
GFR: 66.46 mL/min (ref >60.00)
Glucose, Bld: 94 mg/dL (ref 70–99)
Potassium: 4.8 mEq/L (ref 3.5–5.1)
Sodium: 139 mEq/L (ref 135–145)
Total Bilirubin: 0.5 mg/dL (ref 0.2–1.2)
Total Protein: 7.1 g/dL (ref 6.0–8.3)

## 2022-01-11 MED ORDER — LIDOCAINE HCL (PF) 1 % IJ SOLN
5.0000 mL | Freq: Once | INTRAMUSCULAR | Status: AC
  Administered 2022-01-11: 5 mL

## 2022-01-11 MED ORDER — TRIAMCINOLONE ACETONIDE 40 MG/ML IJ SUSP
40.0000 mg | Freq: Once | INTRAMUSCULAR | Status: AC
  Administered 2022-01-11: 40 mg via INTRA_ARTICULAR

## 2022-01-11 NOTE — Telephone Encounter (Signed)
FYI  Please see below

## 2022-01-11 NOTE — Addendum Note (Signed)
Addended by: Beatrix Fetters on: 01/11/2022 11:01 AM   Modules accepted: Orders

## 2022-01-11 NOTE — Progress Notes (Signed)
OFFICE VISIT  01/11/2022  CC:  Chief Complaint  Patient presents with   Hypertension   Hyperlipidemia    Pt is fasting   HPI:    Patient is a 81 y.o. male who presents for 15-monthfollow-up history of hypertension, history of hyperlipidemia, and chronic renal insufficiency stage II/III. He has coronary artery disease, with history of PCI/stent in 1999 and 2001.  He feels well other than occipital headaches. He got C spine injections and no help. Fusion surgery next possible step but pt holding off. Still having constant HA pain in R occipital area rad up over ear onto top R head. He does ROM exercises.  Pain variable in intensity.  Tylenol prn and soma prn helps some.  ROS as above, plus--> no fevers, no CP, no SOB, no wheezing, no cough, no dizziness, no HAs, no rashes, no melena/hematochezia.  No polyuria or polydipsia.  No myalgias. No focal weakness, paresthesias, or tremors.  No acute vision or hearing abnormalities.  No dysuria or unusual/new urinary urgency or frequency.  No recent changes in lower legs. No n/v/d or abd pain.  No palpitations.    Past Medical History:  Diagnosis Date   Arthritis    joints/  back/  neck   Chronic headaches    due to cervical spondylosis   Chronic renal insufficiency, stage 2 (mild)    borderlines II/III   Coronary artery disease 1999   cardiologist--- dr hPercival Spanish--  (per cardiology note pt refused to take asa/ statin) hx PCI w/ stenting in 1999 to LAD and 12-23-2007 x2 DES to proximal LAD;  Cath 03/2017-->  non- critical 70 - 80% mid/distal circ stenosis,  chronic small D1 80% stenosis (stable) and DES to proximal and mid LAD--   Med mgmt rec'd-->nitrates caused HAs, ACE-I caused cough.   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 PSharlett Iles   Erectile dysfunction after radical prostatectomy    History of kidney stones    History of prostate cancer  12/2014   urologist-- dr dDiona Fanti--   s/p Robot assisted radical prostatectomy 07/2015: lymph node involvement//   undetactable PSA since   HTN (hypertension)    ACE-I cough.  Fine on ARB.   Hx of blastomycosis 1978   s/p  right upper lobectomy   Hyperlipidemia    NASH (nonalcoholic steatohepatitis) 06/2011   normal LFTS  /  Hep B and C testing neg.  Mild fatty liver on CT 2020   Nocturia    Pulmonary nodules    small, picked up on CT ab/pelv for abd pains/flank pains-->pt quit smoking 40 yrs ago.  Low risk for lung ca so no f/u CT needed.   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   Tinnitus    Wears glasses    Wears hearing aid in both ears     Past Surgical History:  Procedure Laterality Date   ANTERIOR CERVICAL DECOMP/DISCECTOMY FUSION  1982   C6--7   [place] 02[place]   @[facility]  by dr hochrein ---  single vessel w/ preserved LVSF   CORONARY ANGIOPLASTY WITH STENT PLACEMENT  12/23/2007   by Dr End--- PCI w/ DES x2 to  pLAD  CORONARY STENT PLACEMENT  1999   PTCA w/ stenting to [place], URETEROSCOPY AND STENT PLACEMENT Left 05/10/2018   Procedure: CYSTOSCOPY WITH LEFT RETROGRADE PYELOGRAM, LEFT URETEROSCOPY AND LEFT URETERAL STENT PLACEMENT;  Surgeon: Alexis Frock, MD;  Location: [facility];  Service: Urology;  Laterality: Left;   HOLMIUM LASER APPLICATION Left 44/31/5400   Procedure: HOLMIUM LASER APPLICATION;  Surgeon: Alexis Frock, MD;  Location: [facility];  Service: Urology;  Laterality: Left;   INCISIONAL HERNIA REPAIR N/A 03/19/2019   Procedure: LAPAROSCOPIC INCISIONAL HERNIA REPAIR WITH MESH, RECURRENT UMBILICAL HERNIA REPAIR;  Surgeon: Michael Boston, MD;  Location: [facility];  Service: General;  Laterality: N/A;   INGUINAL HERNIA REPAIR Right 03/19/2019   Procedure:  LAPAROSCOPIC BILATERAL FEMORAL AND INGUINAL HERNIA REPAIR WITH MESH, LYSIS OF ADHESIONS;  Surgeon: Michael Boston, MD;  Location: [facility];  Service: General;  Laterality: Right;   LEFT HEART CATH AND CORONARY ANGIOGRAPHY N/A 03/21/2017   Stents patent; mod circ dz, nothing for intervention--imdur added.  Procedure: LEFT HEART CATH AND CORONARY ANGIOGRAPHY;  Surgeon: Nelva Bush, MD;  Location: [place] [facility];  Service: Cardiovascular;  Laterality: N/A;   LUMBAR SPINE SURGERY  1978   L5--S1   LUNG LOBECTOMY Right 1978   right upper lobectomy for blastomycosis   LYMPHADENECTOMY Bilateral 07/14/2015   Procedure: LYMPHADENECTOMY;  Surgeon: Raynelle Bring, MD;  Location: [facility];  Service: Urology;  Laterality: Bilateral;   ROBOT ASSISTED LAPAROSCOPIC RADICAL PROSTATECTOMY N/A 07/14/2015   One positive pelvic LN.  Procedure: XI ROBOTIC ASSISTED LAPAROSCOPIC RADICAL PROSTATECTOMY LEVEL 2;  Surgeon: Raynelle Bring, MD;  Location: [facility];  Service: Urology;  Laterality: N/A;   ROTATOR CUFF REPAIR  [id number]   UMBILICAL HERNIA REPAIR  07/14/2015   Procedure: HERNIA REPAIR UMBILICAL ADULT;  Surgeon: Raynelle Bring, MD;  Location: [facility];  Service: Urology;;    Outpatient Medications Prior to Visit  Medication Sig Dispense Refill   acetaminophen (TYLENOL) 500 MG tablet Take 500 mg by mouth every 6 (six) hours as needed.     carisoprodol (SOMA) 350 MG tablet Take 1 tablet by mouth 4 times daily as needed for muscle spasms 60 tablet 1   Cholecalciferol (VITAMIN D3) 5000 units CAPS Take 5,000 Units by mouth daily.     Coenzyme Q10 (COQ10) 100 MG CAPS Take 100 mg by mouth every evening.     Multiple Vitamin (MULTIVITAMIN) tablet Take 1 tablet by mouth daily.     Multiple Vitamins-Minerals (ICAPS AREDS 2 PO) Take 1 capsule by mouth daily.     niacin 100 MG tablet Take 100 mg by mouth at bedtime.     nitroGLYCERIN (NITROSTAT) 0.4 MG SL tablet Place 1 tablet (0.4 mg total) under the tongue every 5 (five)  minutes as needed for chest pain. (Patient taking differently: Place 0.4 mg under the tongue every 5 (five) minutes as needed for chest pain.) 25 tablet 3   Facility-Administered Medications Prior to Visit  Medication Dose Route Frequency Provider Last Rate Last Admin   gentamicin (GARAMYCIN) 350 mg in dextrose 5 % 50 mL IVPB  5 mg/kg Intravenous 30 min Pre-Op Dimple Nanas, RPH        Allergies  Allergen Reactions   Ace Inhibitors Cough   Imdur [Isosorbide Dinitrate] Other (See Comments)    Headache    ROS As per HPI  PE:    01/11/2022    8:02 AM 11/27/2021    5:05 PM 10/18/2021   10:47 AM  Vitals with BMI  Height 5' 9"  5' 9"  5' 9"   Weight 164 lbs 155 lbs 160 lbs 3 oz  BMI 24.21 09.40 76.80  Systolic 881  103  Diastolic 73  73  Pulse 69  68   Physical Exam  Gen: Alert, well appearing.  Patient is oriented to person, place, time, and situation. C-spine: No tenderness in midline or facet regions.  He has focal tenderness to palpation over the right posterior skull base region and this tenderness does extend some up in the retroauricular area on the right. AFFECT: pleasant, lucid thought and speech. CV: RRR, no m/r/g.   LUNGS: CTA bilat, nonlabored resps, good aeration in all lung fields. EXT: no clubbing or cyanosis.  no edema.   LABS:  Last CBC Lab Results  Component Value Date   WBC 3.2 (L) 07/10/2021   HGB 15.2 07/10/2021   HCT 45.1 07/10/2021   MCV 100.5 (H) 07/10/2021   MCH 32.3 06/03/2019   RDW 14.6 07/10/2021   PLT 133.0 (L) 15/94/5859   Last metabolic panel Lab Results  Component Value Date   GLUCOSE 84 07/10/2021   NA 138 07/10/2021   K 4.1 07/10/2021   CL 102 07/10/2021   CO2 27 07/10/2021   BUN 16 07/10/2021   CREATININE 0.92 07/10/2021   GFRNONAA >60 03/16/2019   CALCIUM 9.0 07/10/2021   PROT 6.9 07/10/2021   ALBUMIN 4.6 07/10/2021   BILITOT 0.4 07/10/2021   ALKPHOS 77 07/10/2021   AST 19 07/10/2021   ALT 16 07/10/2021   ANIONGAP 7  03/16/2019   Last lipids Lab Results  Component Value Date   CHOL 170 07/10/2021   HDL 41.30 07/10/2021   LDLCALC 100 (H) 07/10/2021   LDLDIRECT 113.0 12/30/2020   TRIG 146.0 07/10/2021   CHOLHDL 4 07/10/2021   Last thyroid functions Lab Results  Component Value Date   TSH 3.080 03/15/2017   IMPRESSION AND PLAN:  1) Right occipital neuralgia. Discussed option of injection today and pt was in favor of proceeding. Ultrasound-guided injection is preferred based on studies that show increased duration, increased effect, greater accuracy, decreased procedural pain, increased response rate, and decreased cost with ultrasound-guided versus blind injection. Procedure: Real-time ultrasound guided injection of L greater occipital nerve. Device: [employer] informed consent obtained.  Timeout conducted.  No overlying erythema, induration, or other signs of local infection. After sterile prep with Betadine, injected mixture of 40 mg triamcinolone and 5 cc of 1% lidocaine without epinephrine using 5 gauge 1-1/2 inch needle.  Injectate seen filling the fascial plane around the greater occipital nerve just superficial to the obliquus capitis inferior muscle. Patient tolerated the procedure well.  No immediate complications.  Post-injection care discussed. Advised to call if fever/chills, erythema, drainage, or persistent bleeding.  Impression: Technically successful ultrasound-guided injection of the right greater occipital nerve.  Patient had immediate relief of his pain.  2 history of hypertension, great blood pressure and is currently on no medication. Electrolytes and creatinine today.  3.  Hyperlipidemia in the setting of coronary artery disease.  He prefers no medications. We will monitor lipids and hepatic panel today.  #4 chronic renal insufficiency stage II/III. Monitor electrolytes and creatinine today.  He avoids NSAIDs.  An After Visit Summary was printed and given to the  patient.  FOLLOW UP: Return in about 4 weeks (around 02/08/2022) for f/u neck pain.  Signed:  Crissie Sickles, MD           01/11/2022

## 2022-02-07 ENCOUNTER — Ambulatory Visit (INDEPENDENT_AMBULATORY_CARE_PROVIDER_SITE_OTHER): Payer: 59 | Admitting: Family Medicine

## 2022-02-07 ENCOUNTER — Encounter: Payer: Self-pay | Admitting: Family Medicine

## 2022-02-07 VITALS — BP 133/67 | HR 62 | Temp 97.8°F | Ht 69.0 in | Wt 159.2 lb

## 2022-02-07 DIAGNOSIS — M5481 Occipital neuralgia: Secondary | ICD-10-CM | POA: Diagnosis not present

## 2022-02-07 DIAGNOSIS — R42 Dizziness and giddiness: Secondary | ICD-10-CM | POA: Diagnosis not present

## 2022-02-07 DIAGNOSIS — I2510 Atherosclerotic heart disease of native coronary artery without angina pectoris: Secondary | ICD-10-CM

## 2022-02-07 NOTE — Progress Notes (Signed)
OFFICE VISIT  02/07/2022  CC:  Chief Complaint  Patient presents with   Follow-up    Neck pain; pt states it is 100% better since steroid injection. Neck is a little tender today.    HPI:    Patient is a 81 y.o. male who presents for 1 month follow-up right sided neck pain and occipital headaches. I did a steroid injection at the greater occipital nerve on the right last visit and he improved significantly.  INTERIM HX: The injection helped very very well.  However, he still has some days of pain in the same region of the right side of his occiput and he notes this occurring on the days he works out and on the days he wakes up in the morning with after lying on his right side.  He takes a muscle relaxer and this alleviates it. He also notes gradual feeling of some off-balance sensation when standing and doing any normal physical activities during his day.  He thinks it started after the headache issue started. Feels like his legs are generally weak but he finds a little bit hard to describe.  He is not falling but feels like sometimes he is about 2.  He denies any vertigo and it sounds like he does not have distinct dizziness. He does have a history of cervical spondylosis.  He does have a remote history of cervical spine and lumbar spine surgeries.  Past Medical History:  Diagnosis Date   Arthritis    joints/  back/  neck   Chronic headaches    due to cervical spondylosis   Chronic renal insufficiency, stage 2 (mild)    borderlines II/III   Coronary artery disease 1999   cardiologist--- dr Percival Spanish---  (per cardiology note pt refused to take asa/ statin) hx PCI w/ stenting in 1999 to LAD and 12-23-2007 x2 DES to proximal LAD;  Cath 03/2017-->  non- critical 70 - 80% mid/distal circ stenosis,  chronic small D1 80% stenosis (stable) and DES to proximal and mid LAD--   Med mgmt rec'd-->nitrates caused HAs, ACE-I caused cough.   DDD (degenerative disc disease)    lumbar and cervical  neck   Diverticulosis of sigmoid colon 07/25/2011   Severe (endoscopy by Dr. Sharlett Iles)   Erectile dysfunction after radical prostatectomy    History of kidney stones    History of prostate cancer 12/2014   urologist-- dr Diona Fanti---   s/p Robot assisted radical prostatectomy 07/2015: lymph node involvement//   undetactable PSA since   HTN (hypertension)    ACE-I cough.  Fine on ARB.   Hx of blastomycosis 1978   s/p  right upper lobectomy   Hyperlipidemia    NASH (nonalcoholic steatohepatitis) 06/2011   normal LFTS  /  Hep B and C testing neg.  Mild fatty liver on CT 2020   Nocturia    Pulmonary nodules    small, picked up on CT ab/pelv for abd pains/flank pains-->pt quit smoking 40 yrs ago.  Low risk for lung ca so no f/u CT needed.   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   Tinnitus    Wears glasses    Wears hearing aid in both ears     Past Surgical History:  Procedure Laterality Date   ANTERIOR CERVICAL DECOMP/DISCECTOMY FUSION  1982  C6--7   APPENDECTOMY  1953   CARDIAC CATHETERIZATION  02/27/2000   @[facility]  by dr hochrein ---  single vessel w/ preserved LVSF   CORONARY ANGIOPLASTY WITH STENT PLACEMENT  12/23/2007   by Dr End--- PCI w/ DES x2 to  [place]   PTCA w/ stenting to [place], URETEROSCOPY AND STENT PLACEMENT Left 05/10/2018   Procedure: CYSTOSCOPY WITH LEFT RETROGRADE PYELOGRAM, LEFT URETEROSCOPY AND LEFT URETERAL STENT PLACEMENT;  Surgeon: Alexis Frock, MD;  Location: [facility];  Service: Urology;  Laterality: Left;   HOLMIUM LASER APPLICATION Left 20/35/5974   Procedure: HOLMIUM LASER APPLICATION;  Surgeon: Alexis Frock, MD;  Location: [facility];  Service: Urology;  Laterality: Left;   INCISIONAL HERNIA REPAIR N/A 03/19/2019   Procedure: LAPAROSCOPIC INCISIONAL  HERNIA REPAIR WITH MESH, RECURRENT UMBILICAL HERNIA REPAIR;  Surgeon: Michael Boston, MD;  Location: [facility];  Service: General;  Laterality: N/A;   INGUINAL HERNIA REPAIR Right 03/19/2019   Procedure: LAPAROSCOPIC BILATERAL FEMORAL AND INGUINAL HERNIA REPAIR WITH MESH, LYSIS OF ADHESIONS;  Surgeon: Michael Boston, MD;  Location: [facility];  Service: General;  Laterality: Right;   LEFT HEART CATH AND CORONARY ANGIOGRAPHY N/A 03/21/2017   Stents patent; mod circ dz, nothing for intervention--imdur added.  Procedure: LEFT HEART CATH AND CORONARY ANGIOGRAPHY;  Surgeon: Nelva Bush, MD;  Location: [place] [facility];  Service: Cardiovascular;  Laterality: N/A;   LUMBAR SPINE SURGERY  1978   L5--S1   LUNG LOBECTOMY Right 1978   right upper lobectomy for blastomycosis   LYMPHADENECTOMY Bilateral 07/14/2015   Procedure: LYMPHADENECTOMY;  Surgeon: Raynelle Bring, MD;  Location: [facility];  Service: Urology;  Laterality: Bilateral;   ROBOT ASSISTED LAPAROSCOPIC RADICAL PROSTATECTOMY N/A 07/14/2015   One positive pelvic LN.  Procedure: XI ROBOTIC ASSISTED LAPAROSCOPIC RADICAL PROSTATECTOMY LEVEL 2;  Surgeon: Raynelle Bring, MD;  Location: [facility];  Service: Urology;  Laterality: N/A;   ROTATOR CUFF REPAIR  1638   UMBILICAL HERNIA REPAIR  07/14/2015   Procedure: HERNIA REPAIR UMBILICAL ADULT;  Surgeon: Raynelle Bring, MD;  Location: [facility];  Service: Urology;;    Outpatient Medications Prior to Visit  Medication Sig Dispense Refill   acetaminophen (TYLENOL) 500 MG tablet Take 500 mg by mouth every 6 (six) hours as needed.     carisoprodol (SOMA) 350 MG tablet Take 1 tablet by mouth 4 times daily as needed for muscle spasms 60 tablet 1   Cholecalciferol (VITAMIN D3) 5000 units CAPS Take 5,000 Units by mouth daily.     Coenzyme Q10 (COQ10) 100 MG CAPS Take 100 mg by mouth every evening.     Multiple Vitamin (MULTIVITAMIN) tablet Take 1 tablet by mouth daily.     Multiple Vitamins-Minerals (ICAPS AREDS 2 PO) Take  1 capsule by mouth daily.     niacin (VITAMIN B3) 500 MG tablet Take 500 mg by mouth at bedtime.     nitroGLYCERIN (NITROSTAT) 0.4 MG SL tablet Place 1 tablet (0.4 mg total) under the tongue every 5 (five) minutes as needed for chest pain. (Patient not taking: Reported on 02/07/2022) 25 tablet 3   Facility-Administered Medications Prior to Visit  Medication Dose Route Frequency Provider Last Rate Last Admin   gentamicin (GARAMYCIN) 350 mg in dextrose 5 % 50 mL IVPB  5 mg/kg Intravenous 30 min Pre-Op Dimple Nanas, RPH        Allergies  Allergen Reactions   Ace Inhibitors Cough   Imdur [  Isosorbide Dinitrate] Other (See Comments)    Headache    ROS As per HPI  PE:    02/07/2022    8:12 AM 01/11/2022    8:02 AM 11/27/2021    5:05 PM  Vitals with BMI  Height 5' 9"  5' 9"  5' 9"   Weight 159 lbs 3 oz 164 lbs 155 lbs  BMI 23.5 93.90 30.09  Systolic 233 007   Diastolic 67 73   Pulse 62 69    Physical Exam  General: Alert and well-appearing. Affect is pleasant, lucid thought and speech. He has some focal tenderness to palpation behind the right ear on the occipital area. Neuro: CN 2-12 intact bilaterally, strength 5/5 in proximal and distal upper extremities and lower extremities bilaterally.  No sensory deficits.  No tremor.    No ataxia.  Upper extremity and lower extremity DTRs symmetric.  No pronator drift.  LABS:  Last metabolic panel Lab Results  Component Value Date   GLUCOSE 94 01/11/2022   NA 139 01/11/2022   K 4.8 01/11/2022   CL 104 01/11/2022   CO2 28 01/11/2022   BUN 17 01/11/2022   CREATININE 1.06 01/11/2022   GFRNONAA >60 03/16/2019   CALCIUM 9.4 01/11/2022   PROT 7.1 01/11/2022   ALBUMIN 4.6 01/11/2022   BILITOT 0.5 01/11/2022   ALKPHOS 71 01/11/2022   AST 25 01/11/2022   ALT 30 01/11/2022   ANIONGAP 7 03/16/2019   IMPRESSION AND PLAN:  1 occipital neuralgia. Great response to steroid and lidocaine injection about 1 month ago. Still with  intermittent pain in he feels like he would like another injection to see if we can get complete response and he would not have to use muscle relaxer. I think this is a fine next step and he will make an appointment for this.  2.  Unsteady gait, generalized lower extremity weakness. I will know if this may have to do anything with his C-spine arthritis, possibly some cervical stenosis occurring. Discussed option of MRI neck and or getting back to his neurosurgeon.  He does not want to do either of these things right now.  He is starting to work out again and we will see if this helps.  An After Visit Summary was printed and given to the patient.  FOLLOW UP: No follow-ups on file.  Signed:  Crissie Sickles, MD           02/07/2022

## 2022-02-28 ENCOUNTER — Ambulatory Visit (INDEPENDENT_AMBULATORY_CARE_PROVIDER_SITE_OTHER): Payer: 59 | Admitting: Family Medicine

## 2022-02-28 ENCOUNTER — Encounter: Payer: Self-pay | Admitting: Family Medicine

## 2022-02-28 VITALS — BP 138/70 | HR 61 | Temp 97.9°F | Ht 69.0 in | Wt 165.0 lb

## 2022-02-28 DIAGNOSIS — M5481 Occipital neuralgia: Secondary | ICD-10-CM

## 2022-02-28 MED ORDER — TRIAMCINOLONE ACETONIDE 40 MG/ML IJ SUSP
40.0000 mg | Freq: Once | INTRAMUSCULAR | Status: AC
  Administered 2022-02-28: 40 mg via INTRA_ARTICULAR

## 2022-02-28 NOTE — Progress Notes (Signed)
OFFICE VISIT  02/28/2022  CC:  Chief Complaint  Patient presents with   Follow-up    Occipital HA, per pt pain never stopped completely so he decided to return for another injection.    HPI:    Patient is a 81 y.o. male who presents for occipital neuralgia. William Montgomery has a long history of occipital headaches.  I did an injection of the greater occipital nerve on the right on 01/11/2022 and he got good results.    He would like another injection today. States his headache is mild but pretty much there all the time: Right side of occiput extending around right parietal region. He continues to do postural and strengthening workouts of his neck and trunk.   Past Medical History:  Diagnosis Date   Arthritis    joints/  back/  neck   Chronic headaches    due to cervical spondylosis   Chronic renal insufficiency, stage 2 (mild)    borderlines II/III   Coronary artery disease 1999   cardiologist--- dr Percival Spanish---  (per cardiology note pt refused to take asa/ statin) hx PCI w/ stenting in 1999 to LAD and 12-23-2007 x2 DES to proximal LAD;  Cath 03/2017-->  non- critical 70 - 80% mid/distal circ stenosis,  chronic small D1 80% stenosis (stable) and DES to proximal and mid LAD--   Med mgmt rec'd-->nitrates caused HAs, ACE-I caused cough.   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 Sharlett Iles)   Erectile dysfunction after radical prostatectomy    History of kidney stones    History of prostate cancer 12/2014   urologist-- dr Diona Fanti---   s/p Robot assisted radical prostatectomy 07/2015: lymph node involvement//   undetactable PSA since   HTN (hypertension)    ACE-I cough.  Fine on ARB.   Hx of blastomycosis 1978   s/p  right upper lobectomy   Hyperlipidemia    NASH (nonalcoholic steatohepatitis) 06/2011   normal LFTS  /  Hep B and C testing neg.  Mild fatty liver on CT 2020   Nocturia    Pulmonary  nodules    small, picked up on CT ab/pelv for abd pains/flank pains-->pt quit smoking 40 yrs ago.  Low risk for lung ca so no f/u CT needed.   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   Tinnitus    Wears glasses    Wears hearing aid in both ears     Past Surgical History:  Procedure Laterality Date   ANTERIOR CERVICAL DECOMP/DISCECTOMY FUSION  1982   C6--7   [place]  02/27/2000   @[facility]  by dr hochrein ---  single vessel w/ preserved LVSF   CORONARY ANGIOPLASTY WITH STENT PLACEMENT  12/23/2007   by Dr End--- PCI w/ DES x2 to  [place]   PTCA w/ stenting to [place], URETEROSCOPY AND STENT PLACEMENT Left 05/10/2018   Procedure: CYSTOSCOPY WITH LEFT RETROGRADE PYELOGRAM, LEFT URETEROSCOPY AND LEFT URETERAL STENT PLACEMENT;  Surgeon: Alexis Frock, MD;  Location: [facility];  Service: Urology;  Laterality: Left;   HOLMIUM LASER APPLICATION Left 50/01/3817   Procedure: HOLMIUM LASER APPLICATION;  Surgeon: Alexis Frock,  MD;  Location: [facility];  Service: Urology;  Laterality: Left;   INCISIONAL HERNIA REPAIR N/A 03/19/2019   Procedure: LAPAROSCOPIC INCISIONAL HERNIA REPAIR WITH MESH, RECURRENT UMBILICAL HERNIA REPAIR;  Surgeon: Michael Boston, MD;  Location: [facility];  Service: General;  Laterality: N/A;   INGUINAL HERNIA REPAIR Right 03/19/2019   Procedure: LAPAROSCOPIC BILATERAL FEMORAL AND INGUINAL HERNIA REPAIR WITH MESH, LYSIS OF ADHESIONS;  Surgeon: Michael Boston, MD;  Location: [facility];  Service: General;  Laterality: Right;   LEFT HEART CATH AND CORONARY ANGIOGRAPHY N/A 03/21/2017   Stents patent; mod circ dz, nothing for intervention--imdur added.  Procedure: LEFT HEART CATH AND CORONARY ANGIOGRAPHY;  Surgeon: Nelva Bush, MD;  Location: [place]  [facility];  Service: Cardiovascular;  Laterality: N/A;   LUMBAR SPINE SURGERY  1978   L5--S1   LUNG LOBECTOMY Right 1978   right upper lobectomy for blastomycosis   LYMPHADENECTOMY Bilateral 07/14/2015   Procedure: LYMPHADENECTOMY;  Surgeon: Raynelle Bring, MD;  Location: [facility];  Service: Urology;  Laterality: Bilateral;   ROBOT ASSISTED LAPAROSCOPIC RADICAL PROSTATECTOMY N/A 07/14/2015   One positive pelvic LN.  Procedure: XI ROBOTIC ASSISTED LAPAROSCOPIC RADICAL PROSTATECTOMY LEVEL 2;  Surgeon: Raynelle Bring, MD;  Location: [facility];  Service: Urology;  Laterality: N/A;   ROTATOR CUFF REPAIR  1219   UMBILICAL HERNIA REPAIR  07/14/2015   Procedure: HERNIA REPAIR UMBILICAL ADULT;  Surgeon: Raynelle Bring, MD;  Location: [facility];  Service: Urology;;    Outpatient Medications Prior to Visit  Medication Sig Dispense Refill   acetaminophen (TYLENOL) 500 MG tablet Take 500 mg by mouth every 6 (six) hours as needed.     carisoprodol (SOMA) 350 MG tablet Take 1 tablet by mouth 4 times daily as needed for muscle spasms 60 tablet 1   Cholecalciferol (VITAMIN D3) 5000 units CAPS Take 5,000 Units by mouth daily.     Coenzyme Q10 (COQ10) 100 MG CAPS Take 100 mg by mouth every evening.     Multiple Vitamin (MULTIVITAMIN) tablet Take 1 tablet by mouth daily.     Multiple Vitamins-Minerals (ICAPS AREDS 2 PO) Take 1 capsule by mouth daily.     niacin (VITAMIN B3) 500 MG tablet Take 500 mg by mouth at bedtime.     nitroGLYCERIN (NITROSTAT) 0.4 MG SL tablet Place 1 tablet (0.4 mg total) under the tongue every 5 (five) minutes as needed for chest pain. (Patient not taking: Reported on 02/07/2022) 25 tablet 3   Facility-Administered Medications Prior to Visit  Medication Dose Route Frequency Provider Last Rate Last Admin   gentamicin (GARAMYCIN) 350 mg in dextrose 5 % 50 mL IVPB  5 mg/kg Intravenous 30 min Pre-Op Dimple Nanas, [facility]        Allergies  Allergen Reactions   Ace Inhibitors Cough   Imdur  [Isosorbide Dinitrate] Other (See Comments)    Headache    ROS As per HPI  PE:    02/28/2022    8:34 AM 02/07/2022    8:12 AM 01/11/2022    8:02 AM  Vitals with BMI  Height 5' 9"  5' 9"  5' 9"   Weight 165 lbs 159 lbs 3 oz 164 lbs  BMI 24.36 75.8 83.25  Systolic 498 264 158  Diastolic 70 67 73  Pulse 61 62 69   Physical Exam  Gen: Alert, well appearing.  Patient is oriented to person, place, time, and situation. AFFECT: pleasant, lucid thought and speech. Some tenderness to palpation over the greater occipital nerve on the right.  LABS:  Last CBC Lab Results  Component Value Date   WBC 3.2 (L) 07/10/2021   HGB 15.2 07/10/2021   HCT 45.1 07/10/2021   MCV 100.5 (H) 07/10/2021   MCH 32.3 06/03/2019   RDW 14.6 07/10/2021   PLT 133.0 (L) 72/89/7915   Last metabolic panel Lab Results  Component Value Date   GLUCOSE 94 01/11/2022   NA 139 01/11/2022   K 4.8 01/11/2022   CL 104 01/11/2022   CO2 28 01/11/2022   BUN 17 01/11/2022   CREATININE 1.06 01/11/2022   GFRNONAA >60 03/16/2019   CALCIUM 9.4 01/11/2022   PROT 7.1 01/11/2022   ALBUMIN 4.6 01/11/2022   BILITOT 0.5 01/11/2022   ALKPHOS 71 01/11/2022   AST 25 01/11/2022   ALT 30 01/11/2022   ANIONGAP 7 03/16/2019   IMPRESSION AND PLAN:  Occipital neuralgia, right side. Moderate improvement from steroid injection 7 weeks ago. Patient desires another injection today.  Ultrasound-guided injection is preferred based on studies that show increased duration, increased effect, greater accuracy, decreased procedural pain, increased response rate, and decreased cost with ultrasound-guided versus blind injection. Procedure: Real-time ultrasound guided injection of right greater occipital nerve. Device: [employer] informed consent obtained.  Timeout conducted.  No overlying erythema, induration, or other signs of local infection. After sterile prep with Betadine, injected a mixture of 40 mg triamcinolone with 5 cc  1% plain lidocaine using 25-gauge 1-1/2 inch needle, injectate seen filling around the greater occipital nerve. Patient tolerated the procedure well.  No immediate complications.  Post-injection care discussed. Advised to call if fever/chills, erythema, drainage, or persistent bleeding.  Impression: Technically successful ultrasound-guided injection.  An After Visit Summary was printed and given to the patient.  FOLLOW UP: Return if symptoms worsen or fail to improve.  Signed:  Crissie Sickles, MD           02/28/2022

## 2022-03-18 NOTE — Progress Notes (Unsigned)
Cardiology Office Note   Date:  03/21/2022   ID:  William Montgomery, William Montgomery [birth year], MRN [id number]  PCP:  Jeoffrey Massed, MD  Cardiologist:   Rollene Rotunda, MD   Chief Complaint  Patient presents with   Coronary Artery Disease     History of Present Illness: William Montgomery is a 81 y.o. male who presents for follow up of CAD.  At a previous visit he had decreased exercise tolerance.  I sent him for cath and he was found to have 70 - 80% mid/distal circ stenosis and a small D1 80% stenosis and proximal stents in the prox and mid LAD.  He was managed medically.  Following this we try to treat him with Imdur and he developed severe headaches.  He had a cough on lisinopril.   Since I last saw him he has done very well.  He has started walking 14 flights of stairs every day.  He denies any chest pressure, neck or arm discomfort.  He denies any palpitations, presyncope or syncope.  He has no shortness of breath, PND or orthopnea.  He has had no weight gain or edema.   Past Medical History:  Diagnosis Date   Arthritis    joints/  back/  neck   Chronic headaches    due to cervical spondylosis   Chronic renal insufficiency, stage 2 (mild)    borderlines II/III   Coronary artery disease 1999   cardiologist--- dr Antoine Poche---  (per cardiology note pt refused to take asa/ statin) hx PCI w/ stenting in 1999 to LAD and 12-23-2007 x2 DES to proximal LAD;  Cath 03/2017-->  non- critical 70 - 80% mid/distal circ stenosis,  chronic small D1 80% stenosis (stable) and DES to proximal and mid LAD--   Med mgmt rec'd-->nitrates caused HAs, ACE-I caused cough.   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 Jarold Motto)   Erectile dysfunction after radical prostatectomy    History of kidney stones    History of prostate cancer 12/2014   urologist-- dr Retta Diones---   s/p Robot assisted radical prostatectomy  07/2015: lymph node involvement//   undetactable PSA since   HTN (hypertension)    ACE-I cough.  Fine on ARB.   Hx of blastomycosis 1978   s/p  right upper lobectomy   Hyperlipidemia    NASH (nonalcoholic steatohepatitis) 06/2011   normal LFTS  /  Hep B and C testing neg.  Mild fatty liver on CT 2020   Nocturia    Pulmonary nodules    small, picked up on CT ab/pelv for abd pains/flank pains-->pt quit smoking 40 yrs ago.  Low risk for lung ca so no f/u CT needed.   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   Tinnitus    Wears glasses    Wears hearing aid in both ears     Past Surgical History:  Procedure Laterality Date   ANTERIOR CERVICAL DECOMP/DISCECTOMY FUSION  1982   C6--7   APPENDECTOMY  1953   CARDIAC CATHETERIZATION  02/27/2000   @[facility]  by dr Bodee Lafoe ---  single vessel w/ preserved LVSF   CORONARY ANGIOPLASTY WITH STENT PLACEMENT  12/23/2007   by Dr End--- PCI w/ DES  x2 to  pLAD   CORONARY STENT PLACEMENT  1999   PTCA w/ stenting to LAd   CYSTOSCOPY WITH RETROGRADE PYELOGRAM, URETEROSCOPY AND STENT PLACEMENT Left 05/10/2018   Procedure: CYSTOSCOPY WITH LEFT RETROGRADE PYELOGRAM, LEFT URETEROSCOPY AND LEFT URETERAL STENT PLACEMENT;  Surgeon: Sebastian Ache, MD;  Location: [facility];  Service: Urology;  Laterality: Left;   HOLMIUM LASER APPLICATION Left 05/10/2018   Procedure: HOLMIUM LASER APPLICATION;  Surgeon: Sebastian Ache, MD;  Location: [facility];  Service: Urology;  Laterality: Left;   INCISIONAL HERNIA REPAIR N/A 03/19/2019   Procedure: LAPAROSCOPIC INCISIONAL HERNIA REPAIR WITH MESH, RECURRENT UMBILICAL HERNIA REPAIR;  Surgeon: Karie Soda, MD;  Location: [facility];  Service: General;  Laterality: N/A;   INGUINAL HERNIA REPAIR Right 03/19/2019   Procedure: LAPAROSCOPIC BILATERAL FEMORAL AND INGUINAL HERNIA REPAIR WITH MESH, LYSIS OF  ADHESIONS;  Surgeon: Karie Soda, MD;  Location: [facility];  Service: General;  Laterality: Right;   LEFT HEART CATH AND CORONARY ANGIOGRAPHY N/A 03/21/2017   Stents patent; mod circ dz, nothing for intervention--imdur added.  Procedure: LEFT HEART CATH AND CORONARY ANGIOGRAPHY;  Surgeon: Yvonne Kendall, MD;  Location: [facility];  Service: Cardiovascular;  Laterality: N/A;   LUMBAR SPINE SURGERY  1978   L5--S1   LUNG LOBECTOMY Right 1978   right upper lobectomy for blastomycosis   LYMPHADENECTOMY Bilateral 07/14/2015   Procedure: LYMPHADENECTOMY;  Surgeon: Heloise Purpura, MD;  Location: [facility];  Service: Urology;  Laterality: Bilateral;   ROBOT ASSISTED LAPAROSCOPIC RADICAL PROSTATECTOMY N/A 07/14/2015   One positive pelvic LN.  Procedure: XI ROBOTIC ASSISTED LAPAROSCOPIC RADICAL PROSTATECTOMY LEVEL 2;  Surgeon: Heloise Purpura, MD;  Location: [facility];  Service: Urology;  Laterality: N/A;   ROTATOR CUFF REPAIR  2007   UMBILICAL HERNIA REPAIR  07/14/2015   Procedure: HERNIA REPAIR UMBILICAL ADULT;  Surgeon: Heloise Purpura, MD;  Location: [facility];  Service: Urology;;     Current Outpatient Medications  Medication Sig Dispense Refill   acetaminophen (TYLENOL) 500 MG tablet Take 500 mg by mouth every 6 (six) hours as needed.     carisoprodol (SOMA) 350 MG tablet Take 1 tablet by mouth 4 times daily as needed for muscle spasms 60 tablet 1   Cholecalciferol (VITAMIN D3) 5000 units CAPS Take 5,000 Units by mouth daily.     Coenzyme Q10 (COQ10) 100 MG CAPS Take 100 mg by mouth every evening.     Multiple Vitamin (MULTIVITAMIN) tablet Take 1 tablet by mouth daily.     Multiple Vitamins-Minerals (ICAPS AREDS 2 PO) Take 1 capsule by mouth daily.     niacin (VITAMIN B3) 500 MG tablet Take 500 mg by mouth at bedtime.     nitroGLYCERIN (NITROSTAT) 0.4 MG SL tablet Place 1 tablet (0.4 mg total) under the tongue every 5 (five) minutes as needed for chest pain. 25 tablet 3   No current  facility-administered medications for this visit.   Facility-Administered Medications Ordered in Other Visits  Medication Dose Route Frequency Provider Last Rate Last Admin   gentamicin (GARAMYCIN) 350 mg in dextrose 5 % 50 mL IVPB  5 mg/kg Intravenous 30 min Pre-Op Rexford Maus, RPH        Allergies:   Ace inhibitors and Imdur [isosorbide dinitrate]    ROS:  Please see the history of present illness.   Otherwise, review of systems are positive for none.   All other systems are reviewed and negative.    PHYSICAL EXAM: VS:  BP 118/68  Pulse (!) 59   Ht 5\' 9"  (1.753 m)   Wt 163 lb 3.2 oz (74 kg)   SpO2 95%   BMI 24.10 kg/m  , BMI Body mass index is 24.1 kg/m. GENERAL:  Well appearing NECK:  No jugular venous distention, waveform within normal limits, carotid upstroke brisk and symmetric, no bruits, no thyromegaly LUNGS:  Clear to auscultation bilaterally CHEST:  Unremarkable HEART:  PMI not displaced or sustained,S1 and S2 within normal limits, no S3, no S4, no clicks, no rubs, no murmurs ABD:  Flat, positive bowel sounds normal in frequency in pitch, no bruits, no rebound, no guarding, no midline pulsatile mass, no hepatomegaly, no splenomegaly EXT:  2 plus pulses throughout, no edema, no cyanosis no clubbing    EKG:  EKG is  ordered today. The ekg ordered today demonstrates sinus bradycardia, rate 59, axis within normal limits, intervals within normal limits, no acute ST-T wave changes.   Recent Labs: 07/10/2021: Hemoglobin 15.2; Platelets 133.0 01/11/2022: ALT 30; BUN 17; Creatinine, Ser 1.06; Potassium 4.8; Sodium 139    Lipid Panel    Component Value Date/Time   CHOL 194 01/11/2022 0847   TRIG 171.0 (H) 01/11/2022 0847   HDL 39.90 01/11/2022 0847   CHOLHDL 5 01/11/2022 0847   VLDL 34.2 01/11/2022 0847   LDLCALC 120 (H) 01/11/2022 0847   LDLCALC 26 06/03/2019 1155   LDLDIRECT 113.0 12/30/2020 0841      Wt Readings from Last 3 Encounters:  03/20/22 163 lb  3.2 oz (74 kg)  02/28/22 165 lb (74.8 kg)  02/07/22 159 lb 3.2 oz (72.2 kg)      Other studies Reviewed: Additional studies/ records that were reviewed today include: None. Review of the above records demonstrates:  Please see elsewhere in the note.     ASSESSMENT AND PLAN:  CAD - The patient has no new sypmtoms.  No further cardiovascular testing is indicated.  We will continue with aggressive risk reduction and meds as listed.he does not want to take an aspirin  DYSLIPIDEMIA -  LDL was 120.  However, he absolutely does not want to take statin.  He has not on any other medications.  Is very much against the pharmaceutical industry.   HTN (hypertension) -  The blood pressure is at target.  No change in therapy.   Current medicines are reviewed at length with the patient today.  The patient does not have concerns regarding medicines.  The following changes have been made:  None  Labs/ tests ordered today include:  None  Orders Placed This Encounter  Procedures   EKG 12-Lead      Disposition:   FU with me in one year.     Signed, Rollene Rotunda, MD  03/21/2022 12:54 PM    [facility]

## 2022-03-20 ENCOUNTER — Encounter: Payer: Self-pay | Admitting: Cardiology

## 2022-03-20 ENCOUNTER — Ambulatory Visit: Payer: 59 | Attending: Cardiology | Admitting: Cardiology

## 2022-03-20 VITALS — BP 118/68 | HR 59 | Ht 69.0 in | Wt 163.2 lb

## 2022-03-20 DIAGNOSIS — I2510 Atherosclerotic heart disease of native coronary artery without angina pectoris: Secondary | ICD-10-CM | POA: Diagnosis not present

## 2022-03-20 DIAGNOSIS — E785 Hyperlipidemia, unspecified: Secondary | ICD-10-CM | POA: Diagnosis not present

## 2022-03-20 DIAGNOSIS — I10 Essential (primary) hypertension: Secondary | ICD-10-CM | POA: Diagnosis not present

## 2022-03-20 NOTE — Patient Instructions (Signed)
Medication Instructions:   Your physician recommends that you continue on your current medications as directed. Please refer to the Current Medication list given to you today.  *If you need a refill on your cardiac medications before your next appointment, please call your pharmacy*  Lab Work: NONE ordered at this time of appointment   If you have labs (blood work) drawn today and your tests are completely normal, you will receive your results only by: [place] (if you have MyChart) OR A paper copy in the mail If you have any lab test that is abnormal or we need to change your treatment, we will call you to review the results.  Testing/Procedures: NONE ordered at this time of appointment   Follow-Up: At [facility], you and your health needs are our priority.  As part of our continuing mission to provide you with exceptional heart care, we have created designated Provider Care Teams.  These Care Teams include your primary Cardiologist (physician) and Advanced Practice Providers (APPs -  Physician Assistants and Nurse Practitioners) who all work together to provide you with the care you need, when you need it.  Your next appointment:   1 year(s)  The format for your next appointment:   In Person  Provider:   Minus Breeding, MD     Other Instructions  Important Information About Sugar

## 2022-03-21 ENCOUNTER — Encounter: Payer: Self-pay | Admitting: Cardiology

## 2022-03-26 ENCOUNTER — Other Ambulatory Visit (HOSPITAL_BASED_OUTPATIENT_CLINIC_OR_DEPARTMENT_OTHER): Payer: Self-pay

## 2022-03-26 ENCOUNTER — Other Ambulatory Visit: Payer: Self-pay | Admitting: Family Medicine

## 2022-03-26 MED ORDER — CARISOPRODOL 350 MG PO TABS
350.0000 mg | ORAL_TABLET | Freq: Four times a day (QID) | ORAL | 3 refills | Status: DC | PRN
Start: 2022-03-26 — End: 2022-09-11
  Filled 2022-03-26: qty 60, 15d supply, fill #0
  Filled 2022-08-01: qty 60, 15d supply, fill #1

## 2022-03-26 NOTE — Telephone Encounter (Signed)
Requesting: [place] Contract: n/a UDS: n/a Last Visit: 02/28/22 Next Visit: n/a Last Refill: 10/20/21(60,1)  Please Advise. Med pending

## 2022-04-16 ENCOUNTER — Other Ambulatory Visit (HOSPITAL_BASED_OUTPATIENT_CLINIC_OR_DEPARTMENT_OTHER): Payer: Self-pay

## 2022-04-16 ENCOUNTER — Encounter: Payer: Self-pay | Admitting: Family Medicine

## 2022-04-16 ENCOUNTER — Ambulatory Visit (INDEPENDENT_AMBULATORY_CARE_PROVIDER_SITE_OTHER): Payer: 59 | Admitting: Family Medicine

## 2022-04-16 VITALS — BP 132/80 | HR 56 | Temp 97.7°F | Ht 69.0 in | Wt 166.8 lb

## 2022-04-16 DIAGNOSIS — F410 Panic disorder [episodic paroxysmal anxiety] without agoraphobia: Secondary | ICD-10-CM | POA: Diagnosis not present

## 2022-04-16 DIAGNOSIS — I2510 Atherosclerotic heart disease of native coronary artery without angina pectoris: Secondary | ICD-10-CM

## 2022-04-16 MED ORDER — PROPRANOLOL HCL 10 MG PO TABS
10.0000 mg | ORAL_TABLET | Freq: Two times a day (BID) | ORAL | 1 refills | Status: DC | PRN
  Filled 2022-04-16: qty 30, 8d supply, fill #0
  Filled 2022-04-16: qty 5, 2d supply, fill #0
  Filled 2022-04-16: qty 25, 6d supply, fill #0
  Filled 2022-09-20: qty 30, 8d supply, fill #1

## 2022-04-16 NOTE — Progress Notes (Signed)
OFFICE VISIT  04/16/2022  CC:  Chief Complaint  Patient presents with   Panic Attacks    Has been having them for a long time but recently they have been more frequently, every day at night when going to bed.    Patient is a 81 y.o. male who presents for "panic attacks"  HPI: Describes a long history of feeling intermittent sensation of the world closing in on him, mild lightheadedness, some shortness of breath.  Has to get up and walk around and it gradually subsides. A long time ago he read or watched a story about a young lady getting kidnapped and put in a box for a few days.  This had a profound effect on him and he sometimes relives these thoughts and starts to feel very anxious.  Claustrophobia is how he describes it. He dreads going to sleep because he dreads waking up in the night feeling the sensation. He is under a lot more stress lately because he is laying a wood floor for his sister-in-law and he is very stressed about it. This has these episodes occurring nearly every night now.  He does not awaken in the night choking or sweating.  Denies excessive daytime sleepiness.  His wife has not noted any apneic spells in sleep.  Past Medical History:  Diagnosis Date   Arthritis    joints/  back/  neck   Chronic headaches    due to cervical spondylosis   Chronic renal insufficiency, stage 2 (mild)    borderlines II/III   Coronary artery disease 1999   cardiologist--- dr Percival Spanish---  (per cardiology note pt refused to take asa/ statin) hx PCI w/ stenting in 1999 to LAD and 12-23-2007 x2 DES to proximal LAD;  Cath 03/2017-->  non- critical 70 - 80% mid/distal circ stenosis,  chronic small D1 80% stenosis (stable) and DES to proximal and mid LAD--   Med mgmt rec'd-->nitrates caused HAs, ACE-I caused cough.   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  Fine on ARB.   Hx of blastomycosis 1978   s/p  right upper lobectomy   Hyperlipidemia    NASH (nonalcoholic steatohepatitis) 06/2011   normal LFTS  /  Hep B and C testing neg.  Mild fatty liver on CT 2020   Nocturia    Pulmonary nodules    small, picked up on CT ab/pelv for abd pains/flank pains-->pt quit smoking 40 yrs ago.  Low risk for lung ca so no f/u CT needed.   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   Tinnitus    Wears glasses    Wears hearing aid in both ears     Past Surgical History:  Procedure Laterality Date   ANTERIOR CERVICAL DECOMP/DISCECTOMY FUSION  1982   C6--7   [place]  02/27/2000   @[facility]  by dr hochrein ---  single vessel w/  preserved LVSF   CORONARY ANGIOPLASTY WITH STENT PLACEMENT  12/23/2007   by Dr End--- PCI w/ DES x2 to  [place]   PTCA w/ stenting to [place], URETEROSCOPY AND STENT PLACEMENT Left 05/10/2018   Procedure: CYSTOSCOPY WITH LEFT RETROGRADE PYELOGRAM, LEFT URETEROSCOPY AND LEFT URETERAL STENT PLACEMENT;  Surgeon: Alexis Frock, MD;  Location: [facility];  Service: Urology;  Laterality: Left;   HOLMIUM LASER APPLICATION Left 87/68/1157   Procedure: HOLMIUM LASER APPLICATION;  Surgeon: Alexis Frock, MD;  Location: [facility];  Service: Urology;  Laterality: Left;   INCISIONAL HERNIA REPAIR N/A 03/19/2019   Procedure: LAPAROSCOPIC INCISIONAL HERNIA REPAIR WITH MESH, RECURRENT UMBILICAL HERNIA REPAIR;  Surgeon: Michael Boston, MD;  Location: [facility];  Service:  General;  Laterality: N/A;   INGUINAL HERNIA REPAIR Right 03/19/2019   Procedure: LAPAROSCOPIC BILATERAL FEMORAL AND INGUINAL HERNIA REPAIR WITH MESH, LYSIS OF ADHESIONS;  Surgeon: Michael Boston, MD;  [facility]: [facility];  Service: General;  Laterality: Right;   LEFT HEART CATH AND CORONARY ANGIOGRAPHY N/A 03/21/2017   Stents patent; mod circ dz, nothing for intervention--imdur added.  Procedure: LEFT HEART CATH AND CORONARY ANGIOGRAPHY;  Surgeon: Nelva Bush, MD;  Location: [facility];  Service: Cardiovascular;  Laterality: N/A;   LUMBAR SPINE SURGERY  1978   L5--S1   LUNG LOBECTOMY Right 1978   right upper lobectomy for blastomycosis   LYMPHADENECTOMY Bilateral 07/14/2015   Procedure: LYMPHADENECTOMY;  Surgeon: Raynelle Bring, MD;  [facility]: [facility];  Service: Urology;  Laterality: Bilateral;   ROBOT ASSISTED LAPAROSCOPIC RADICAL PROSTATECTOMY N/A 07/14/2015   One positive pelvic LN.  Procedure: XI ROBOTIC ASSISTED LAPAROSCOPIC RADICAL PROSTATECTOMY LEVEL 2;  Surgeon: Raynelle Bring, MD;  [facility]: [facility];  Service: Urology;  Laterality: N/A;   ROTATOR CUFF REPAIR  2620   UMBILICAL HERNIA REPAIR  07/14/2015   Procedure: HERNIA REPAIR UMBILICAL ADULT;  Surgeon: Raynelle Bring, MD;  [facility]: [facility];  Service: Urology;;    Outpatient Medications Prior to Visit  Medication Sig Dispense Refill   acetaminophen (TYLENOL) 500 MG tablet Take 500 mg by mouth every 6 (six) hours as needed.     carisoprodol (SOMA) 350 MG tablet Take 1 tablet by mouth 4 times daily as needed for muscle spasms 60 tablet 3   Cholecalciferol (VITAMIN D3) 5000 units CAPS Take 5,000 Units by mouth daily.     Coenzyme Q10 (COQ10) 100 MG CAPS Take 100 mg by mouth every evening.     Multiple Vitamin (MULTIVITAMIN) tablet Take 1 tablet by mouth daily.     Multiple Vitamins-Minerals (ICAPS AREDS 2 PO) Take 1 capsule by mouth daily.     niacin (VITAMIN B3) 500 MG tablet Take 500 mg by mouth at bedtime.     nitroGLYCERIN  (NITROSTAT) 0.4 MG SL tablet Place 1 tablet (0.4 mg total) under the tongue every 5 (five) minutes as needed for chest pain. (Patient not taking: Reported on 04/16/2022) 25 tablet 3   Facility-Administered Medications Prior to Visit  Medication Dose Route Frequency Provider Last Rate Last Admin   gentamicin (GARAMYCIN) 350 mg in dextrose 5 % 50 mL IVPB  5 mg/kg Intravenous 30 min Pre-Op Dimple Nanas, RPH        Allergies  Allergen Reactions   Ace Inhibitors Cough   Imdur [Isosorbide Dinitrate] Other (See Comments)    Headache    ROS As per HPI  PE:    04/16/2022  10:03 AM 04/16/2022    9:27 AM 03/20/2022    1:43 PM  Vitals with BMI  Height  5' 9"  5' 9"   Weight  166 lbs 13 oz 163 lbs 3 oz  BMI  16.10 96.04  Systolic 540 981 191  Diastolic 80 74 68  Pulse  56 59     Physical Exam  Gen: Alert, well appearing.  Patient is oriented to person, place, time, and situation. AFFECT: pleasant, lucid thought and speech. No further exam today.  LABS:  none  IMPRESSION AND PLAN:  Panic attacks. He has not wanted take a daily manage present. 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 His baseline heart rate is around 60. We will go ahead and try low-dose propranolol: 10 mg, 1-2 twice daily as needed. He will monitor heart rate and blood pressure and symptoms closely.  An After Visit Summary was printed and given to the patient.  FOLLOW UP: Return in about 1 week (around 04/23/2022) for f/u panic.  Signed:  Crissie Sickles, MD           04/16/2022

## 2022-04-24 ENCOUNTER — Ambulatory Visit (INDEPENDENT_AMBULATORY_CARE_PROVIDER_SITE_OTHER): Payer: 59 | Admitting: Family Medicine

## 2022-04-24 ENCOUNTER — Encounter: Payer: Self-pay | Admitting: Family Medicine

## 2022-04-24 VITALS — BP 122/67 | HR 57 | Temp 97.8°F | Ht 69.0 in | Wt 165.8 lb

## 2022-04-24 DIAGNOSIS — F410 Panic disorder [episodic paroxysmal anxiety] without agoraphobia: Secondary | ICD-10-CM | POA: Diagnosis not present

## 2022-04-24 DIAGNOSIS — I2510 Atherosclerotic heart disease of native coronary artery without angina pectoris: Secondary | ICD-10-CM

## 2022-04-24 NOTE — Progress Notes (Signed)
OFFICE VISIT  04/24/2022  CC:  Chief Complaint  Patient presents with   Follow-up    Panic, pt is feeling better. Has only taken 2 tablets of Propanolol since prescribed.    Patient is a 81 y.o. male who presents for 1 week follow-up panic attacks. A/P as of last visit: "Panic attacks. He has not wanted take a daily manage present. 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 His baseline heart rate is around 60. We will go ahead and try low-dose propranolol: 10 mg, 1-2 twice daily as needed. He will monitor heart rate and blood pressure and symptoms closely"  INTERIM HX: Doing much better, essentially back to baseline now.  He did take the propranolol the day I saw him last.  Tolerated it well but could not tell really that it did anything. Fortunately, he has not had the panic come back. 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   Past Medical History:  Diagnosis Date   Arthritis    joints/  back/  neck   Chronic headaches    due to cervical spondylosis   Chronic renal insufficiency, stage 2 (mild)    borderlines II/III   Coronary artery disease 1999   cardiologist--- dr Percival Spanish---  (per cardiology note pt refused to take asa/ statin) hx PCI w/ stenting in 1999 to LAD and 12-23-2007 x2 DES to proximal LAD;  Cath 03/2017-->  non- critical 70 - 80% mid/distal circ stenosis,  chronic small D1 80% stenosis (stable) and DES to proximal and mid LAD--   Med mgmt rec'd-->nitrates caused HAs, ACE-I caused cough.   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 Sharlett Iles)   Erectile dysfunction after radical prostatectomy    History of kidney stones    History of prostate cancer 12/2014   urologist-- dr Diona Fanti---   s/p Robot assisted radical prostatectomy 07/2015:  lymph node involvement//   undetactable PSA since   HTN (hypertension)    ACE-I cough.  Fine on ARB.   Hx of blastomycosis 1978   s/p  right upper lobectomy   Hyperlipidemia    NASH (nonalcoholic steatohepatitis) 06/2011   normal LFTS  /  Hep B and C testing neg.  Mild fatty liver on CT 2020   Nocturia    Pulmonary nodules    small, picked up on CT ab/pelv for abd pains/flank pains-->pt quit smoking 40 yrs ago.  Low risk for lung ca so no f/u CT needed.   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   Tinnitus    Wears glasses    Wears hearing aid in both ears     Past Surgical History:  Procedure Laterality Date   ANTERIOR CERVICAL DECOMP/DISCECTOMY FUSION  1982   C6--7   [place]  02/27/2000   @[facility]  by dr hochrein ---  single vessel w/ preserved LVSF   CORONARY ANGIOPLASTY WITH STENT PLACEMENT  12/23/2007   by Dr End--- PCI w/ DES x2 to  [place]   PTCA w/ stenting to LAd  CYSTOSCOPY WITH RETROGRADE PYELOGRAM, URETEROSCOPY AND STENT PLACEMENT Left 05/10/2018   Procedure: CYSTOSCOPY WITH LEFT RETROGRADE PYELOGRAM, LEFT URETEROSCOPY AND LEFT URETERAL STENT PLACEMENT;  Surgeon: Alexis Frock, MD;  [facility];  Service: Urology;  Laterality: Left;   HOLMIUM LASER APPLICATION Left 85/27/7824   Procedure: HOLMIUM LASER APPLICATION;  Surgeon: Alexis Frock, MD;  [facility];  Service: Urology;  Laterality: Left;   INCISIONAL HERNIA REPAIR N/A 03/19/2019   Procedure: LAPAROSCOPIC INCISIONAL HERNIA REPAIR WITH MESH, RECURRENT UMBILICAL HERNIA REPAIR;  Surgeon: Michael Boston, MD;  [facility];  Service: General;  Laterality: N/A;   INGUINAL HERNIA REPAIR Right 03/19/2019   Procedure: LAPAROSCOPIC BILATERAL FEMORAL AND INGUINAL HERNIA REPAIR WITH MESH, LYSIS OF ADHESIONS;   Surgeon: Michael Boston, MD;  [facility];  Service: General;  Laterality: Right;   LEFT HEART CATH AND CORONARY ANGIOGRAPHY N/A 03/21/2017   Stents patent; mod circ dz, nothing for intervention--imdur added.  Procedure: LEFT HEART CATH AND CORONARY ANGIOGRAPHY;  Surgeon: Nelva Bush, MD;  Location: [facility];  Service: Cardiovascular;  Laterality: N/A;   LUMBAR SPINE SURGERY  1978   L5--S1   LUNG LOBECTOMY Right 1978   right upper lobectomy for blastomycosis   LYMPHADENECTOMY Bilateral 07/14/2015   Procedure: LYMPHADENECTOMY;  Surgeon: Raynelle Bring, MD;  [facility];  Service: Urology;  Laterality: Bilateral;   ROBOT ASSISTED LAPAROSCOPIC RADICAL PROSTATECTOMY N/A 07/14/2015   One positive pelvic LN.  Procedure: XI ROBOTIC ASSISTED LAPAROSCOPIC RADICAL PROSTATECTOMY LEVEL 2;  Surgeon: Raynelle Bring, MD;  [facility];  Service: Urology;  Laterality: N/A;   ROTATOR CUFF REPAIR  2353   UMBILICAL HERNIA REPAIR  07/14/2015   Procedure: HERNIA REPAIR UMBILICAL ADULT;  Surgeon: Raynelle Bring, MD;  [facility];  Service: Urology;;    Outpatient Medications Prior to Visit  Medication Sig Dispense Refill   acetaminophen (TYLENOL) 500 MG tablet Take 500 mg by mouth every 6 (six) hours as needed.     carisoprodol (SOMA) 350 MG tablet Take 1 tablet by mouth 4 times daily as needed for muscle spasms 60 tablet 3   Cholecalciferol (VITAMIN D3) 5000 units CAPS Take 5,000 Units by mouth daily.     Coenzyme Q10 (COQ10) 100 MG CAPS Take 100 mg by mouth every evening.     Multiple Vitamin (MULTIVITAMIN) tablet Take 1 tablet by mouth daily.     Multiple Vitamins-Minerals (ICAPS AREDS 2 PO) Take 1 capsule by mouth daily.     niacin (VITAMIN B3) 500 MG tablet Take 500 mg by mouth at bedtime.     nitroGLYCERIN (NITROSTAT) 0.4 MG SL tablet Place 1 tablet (0.4 mg total) under the tongue every 5 (five) minutes as needed for chest pain. 25 tablet 3   propranolol (INDERAL) 10 MG tablet  Take 1-2 tablets (10-20 mg total) by mouth 2 (two) times daily as needed for anxiety / panic 30 tablet 1   Facility-Administered Medications Prior to Visit  Medication Dose Route Frequency Provider Last Rate Last Admin   gentamicin (GARAMYCIN) 350 mg in dextrose 5 % 50 mL IVPB  5 mg/kg Intravenous 30 min Pre-Op Dimple Nanas, RPH        Allergies  Allergen Reactions   Ace Inhibitors Cough   Imdur [Isosorbide Dinitrate] Other (See Comments)    Headache    ROS As per HPI  PE:    04/24/2022   10:35 AM 04/16/2022   10:03 AM 04/16/2022    9:27 AM  Vitals with  BMI  Height 5' 9"   5' 9"   Weight 165 lbs 13 oz  166 lbs 13 oz  BMI 56.15  37.94  Systolic 327 614 709  Diastolic 67 80 74  Pulse 57  56     Physical Exam  Gen: Alert, well appearing.  Patient is oriented to person, place, time, and situation. AFFECT: pleasant, lucid thought and speech. No further exam today.  LABS:  Last metabolic panel Lab Results  Component Value Date   GLUCOSE 94 01/11/2022   NA 139 01/11/2022   K 4.8 01/11/2022   CL 104 01/11/2022   CO2 28 01/11/2022   BUN 17 01/11/2022   CREATININE 1.06 01/11/2022   GFRNONAA >60 03/16/2019   CALCIUM 9.4 01/11/2022   PROT 7.1 01/11/2022   ALBUMIN 4.6 01/11/2022   BILITOT 0.5 01/11/2022   ALKPHOS 71 01/11/2022   AST 25 01/11/2022   ALT 30 01/11/2022   ANIONGAP 7 03/16/2019   IMPRESSION AND PLAN:  Panic attacks. Resolved. He has propranolol to use as needed in the future if these return.  An After Visit Summary was printed and given to the patient.  FOLLOW UP: Return if symptoms worsen or fail to improve.  Signed:  Crissie Sickles, MD           04/24/2022

## 2022-05-10 DIAGNOSIS — Z8546 Personal history of malignant neoplasm of prostate: Secondary | ICD-10-CM | POA: Diagnosis not present

## 2022-05-10 LAB — PSA: PSA: <0.015

## 2022-05-25 DIAGNOSIS — N5201 Erectile dysfunction due to arterial insufficiency: Secondary | ICD-10-CM | POA: Diagnosis not present

## 2022-05-25 DIAGNOSIS — Z8546 Personal history of malignant neoplasm of prostate: Secondary | ICD-10-CM | POA: Diagnosis not present

## 2022-06-18 DIAGNOSIS — L821 Other seborrheic keratosis: Secondary | ICD-10-CM | POA: Diagnosis not present

## 2022-06-18 DIAGNOSIS — L814 Other melanin hyperpigmentation: Secondary | ICD-10-CM | POA: Diagnosis not present

## 2022-06-18 DIAGNOSIS — L570 Actinic keratosis: Secondary | ICD-10-CM | POA: Diagnosis not present

## 2022-06-18 DIAGNOSIS — L820 Inflamed seborrheic keratosis: Secondary | ICD-10-CM | POA: Diagnosis not present

## 2022-06-18 DIAGNOSIS — L578 Other skin changes due to chronic exposure to nonionizing radiation: Secondary | ICD-10-CM | POA: Diagnosis not present

## 2022-06-18 DIAGNOSIS — D225 Melanocytic nevi of trunk: Secondary | ICD-10-CM | POA: Diagnosis not present

## 2022-06-18 DIAGNOSIS — Z85828 Personal history of other malignant neoplasm of skin: Secondary | ICD-10-CM | POA: Diagnosis not present

## 2022-07-13 ENCOUNTER — Encounter (INDEPENDENT_AMBULATORY_CARE_PROVIDER_SITE_OTHER): Payer: Medicare Other | Admitting: Ophthalmology

## 2022-07-13 DIAGNOSIS — H353132 Nonexudative age-related macular degeneration, bilateral, intermediate dry stage: Secondary | ICD-10-CM | POA: Diagnosis not present

## 2022-07-13 DIAGNOSIS — H43813 Vitreous degeneration, bilateral: Secondary | ICD-10-CM | POA: Diagnosis not present

## 2022-07-31 ENCOUNTER — Other Ambulatory Visit (HOSPITAL_BASED_OUTPATIENT_CLINIC_OR_DEPARTMENT_OTHER): Payer: Self-pay

## 2022-08-01 ENCOUNTER — Other Ambulatory Visit: Payer: Self-pay

## 2022-08-14 NOTE — Progress Notes (Unsigned)
Spoke to patient about coming in ealier than scheduled because of balancing issues that he is reporting.  Patient scheduled for tomorrow 4/10 at 11:00am with Dr. Milinda [facility].

## 2022-08-15 ENCOUNTER — Ambulatory Visit: Payer: Medicare Other | Admitting: Family Medicine

## 2022-08-16 ENCOUNTER — Ambulatory Visit (INDEPENDENT_AMBULATORY_CARE_PROVIDER_SITE_OTHER): Payer: Commercial Managed Care - PPO | Admitting: Family Medicine

## 2022-08-16 ENCOUNTER — Encounter: Payer: Self-pay | Admitting: Family Medicine

## 2022-08-16 VITALS — BP 115/68 | HR 60 | Wt 162.8 lb

## 2022-08-16 DIAGNOSIS — R29898 Other symptoms and signs involving the musculoskeletal system: Secondary | ICD-10-CM | POA: Diagnosis not present

## 2022-08-16 DIAGNOSIS — M4802 Spinal stenosis, cervical region: Secondary | ICD-10-CM

## 2022-08-16 DIAGNOSIS — M48062 Spinal stenosis, lumbar region with neurogenic claudication: Secondary | ICD-10-CM | POA: Diagnosis not present

## 2022-08-16 NOTE — Progress Notes (Signed)
OFFICE VISIT  08/16/2022  CC:  Chief Complaint  Patient presents with   Acute Visit    States that he has been having some balance issues. Denies any nausea.     Patient is a 82 y.o. male who presents for poor balance.  HPI: William Montgomery has had progressive difficulty with feeling off balance when standing up and even worse when walking.  No dizziness. Feels a bit of a vague ache and abnormal sensation in both biceps.  Feels like right leg is just a bit weaker than the left. No numbness or tingling in the hands or legs.  Has difficulty standing up when he bends down.  Feels like arms are weak and he cannot do the things that he used to be able to do. He is very upset because he is unable to do his normal activities due to his generalized weakness, feels like he may be turning into an invalid, this has him very depressed and hopeless. He denies suicidal or homicidal ideation.  Review of systems: Recurrent headaches in the back of the head on the right consistent with occipital neuralgia. No generalized headaches.  No nausea, no vomiting.  No loss of bowel or bladder function.  No tremor.   Past Medical History:  Diagnosis Date   Arthritis    joints/  back/  neck   Chronic headaches    due to cervical spondylosis   Chronic renal insufficiency, stage 2 (mild)    borderlines II/III   Coronary artery disease 1999   cardiologist--- dr Antoine Pochehochrein---  (per cardiology note pt refused to take asa/ statin) hx PCI w/ stenting in 1999 to LAD and 12-23-2007 x2 DES to proximal LAD;  Cath 03/2017-->  non- critical 70 - 80% mid/distal circ stenosis,  chronic small D1 80% stenosis (stable) and DES to proximal and mid LAD--   Med mgmt rec'd-->nitrates caused HAs, ACE-I caused cough.   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 Jarold MottoPatterson)   Erectile dysfunction after radical prostatectomy    History of kidney stones     History of prostate cancer 12/2014   urologist-- dr Retta Dionesdahlstedt---   s/p Robot assisted radical prostatectomy 07/2015: lymph node involvement//   undetactable PSA since   HTN (hypertension)    ACE-I cough.  Fine on ARB.   Hx of blastomycosis 1978   s/p  right upper lobectomy   Hyperlipidemia    NASH (nonalcoholic steatohepatitis) 06/2011   normal LFTS  /  Hep B and C testing neg.  Mild fatty liver on CT 2020   Nocturia    Pulmonary nodules    small, picked up on CT ab/pelv for abd pains/flank pains-->pt quit smoking 40 yrs ago.  Low risk for lung ca so no f/u CT needed.   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   Tinnitus    Wears glasses    Wears hearing aid in both ears     Past Surgical History:  Procedure Laterality Date   ANTERIOR CERVICAL DECOMP/DISCECTOMY FUSION  1982   C6--7   APPENDECTOMY  1953   CARDIAC CATHETERIZATION  02/27/2000   @[facility]  by dr hochrein ---  single vessel w/ preserved LVSF   CORONARY ANGIOPLASTY WITH STENT PLACEMENT  12/23/2007   by Dr End--- PCI w/ DES x2 to  pLAD   CORONARY STENT PLACEMENT  1999   PTCA w/ stenting to LAd   CYSTOSCOPY WITH RETROGRADE PYELOGRAM, URETEROSCOPY AND STENT PLACEMENT Left 05/10/2018   Procedure: CYSTOSCOPY WITH LEFT RETROGRADE PYELOGRAM, LEFT URETEROSCOPY AND LEFT URETERAL STENT PLACEMENT;  Surgeon: Sebastian Ache, MD;  [facility];  Service: Urology;  Laterality: Left;   HOLMIUM LASER APPLICATION Left 05/10/2018   Procedure: HOLMIUM LASER APPLICATION;  Surgeon: Sebastian Ache, MD;  [facility];  Service: Urology;  Laterality: Left;   INCISIONAL HERNIA REPAIR N/A 03/19/2019   Procedure: LAPAROSCOPIC INCISIONAL HERNIA REPAIR WITH MESH, RECURRENT UMBILICAL HERNIA REPAIR;  Surgeon: Karie Soda, MD;  [facility];  Service: General;  Laterality: N/A;   INGUINAL HERNIA REPAIR  Right 03/19/2019   Procedure: LAPAROSCOPIC BILATERAL FEMORAL AND INGUINAL HERNIA REPAIR WITH MESH, LYSIS OF ADHESIONS;  Surgeon: Karie Soda, MD;  [facility];  Service: General;  Laterality: Right;   LEFT HEART CATH AND CORONARY ANGIOGRAPHY N/A 03/21/2017   Stents patent; mod circ dz, nothing for intervention--imdur added.  Procedure: LEFT HEART CATH AND CORONARY ANGIOGRAPHY;  Surgeon: Yvonne Kendall, MD;  Location: [facility];  Service: Cardiovascular;  Laterality: N/A;   LUMBAR SPINE SURGERY  1978   L5--S1   LUNG LOBECTOMY Right 1978   right upper lobectomy for blastomycosis   LYMPHADENECTOMY Bilateral 07/14/2015   Procedure: LYMPHADENECTOMY;  Surgeon: Heloise Purpura, MD;  [facility];  Service: Urology;  Laterality: Bilateral;   ROBOT ASSISTED LAPAROSCOPIC RADICAL PROSTATECTOMY N/A 07/14/2015   One positive pelvic LN.  Procedure: XI ROBOTIC ASSISTED LAPAROSCOPIC RADICAL PROSTATECTOMY LEVEL 2;  Surgeon: Heloise Purpura, MD;  [facility];  Service: Urology;  Laterality: N/A;   ROTATOR CUFF REPAIR  2007   UMBILICAL HERNIA REPAIR  07/14/2015   Procedure: HERNIA REPAIR UMBILICAL ADULT;  Surgeon: Heloise Purpura, MD;  [facility]: [facility];  Service: Urology;;    Outpatient Medications Prior to Visit  Medication Sig Dispense Refill   acetaminophen (TYLENOL) 500 MG tablet Take 500 mg by mouth every 6 (six) hours as needed.     carisoprodol (SOMA) 350 MG tablet Take 1 tablet by mouth 4 times daily as needed for muscle spasms 60 tablet 3   Cholecalciferol (VITAMIN D3) 5000 units CAPS Take 5,000 Units by mouth daily.     Coenzyme Q10 (COQ10) 100 MG CAPS Take 100 mg by mouth every evening.     Multiple Vitamin (MULTIVITAMIN) tablet Take 1 tablet by mouth daily.     Multiple Vitamins-Minerals (ICAPS AREDS 2 PO) Take 1 capsule by mouth daily.     niacin (VITAMIN B3) 500 MG tablet Take 500 mg by mouth at bedtime.     nitroGLYCERIN (NITROSTAT) 0.4 MG SL tablet Place 1 tablet (0.4 mg  total) under the tongue every 5 (five) minutes as needed for chest pain. 25 tablet 3   propranolol (INDERAL) 10 MG tablet Take 1-2 tablets (10-20 mg total) by mouth 2 (two) times daily as needed for anxiety / panic 30 tablet 1   Facility-Administered Medications Prior to Visit  Medication Dose Route Frequency Provider Last Rate Last Admin   gentamicin (GARAMYCIN) 350 mg in dextrose 5 % 50 mL IVPB  5 mg/kg Intravenous 30 min Pre-Op Rexford Maus, RPH        Allergies  Allergen Reactions   Ace Inhibitors Cough   Imdur [Isosorbide Dinitrate] Other (See Comments)    Headache  Review of Systems  As per HPI  PE:    08/16/2022    3:42 PM 04/24/2022   10:35 AM 04/16/2022   10:03 AM  Vitals with BMI  Height  5\' 9"    Weight 162 lbs 13 oz 165 lbs 13 oz   BMI  24.47   Systolic 115 122 939  Diastolic 68 67 80  Pulse 60 57      Physical Exam  General: Alert, no acute distress. Affect is worried and sad.  He cries at times. Gait is a little bit wide-based and unsteady, steps are a little bit short.  No shuffling. CN 2-12 intact bilaterally, strength 4/5 in proximal and distal upper extremities and lower extremities bilaterally.  No sensory deficits.  No tremor.  Finger-nose-finger normal bilaterally.    Upper extremity and lower extremity DTRs symmetric.  No pronator drift.  LABS:  Last metabolic panel Lab Results  Component Value Date   GLUCOSE 94 01/11/2022   NA 139 01/11/2022   K 4.8 01/11/2022   CL 104 01/11/2022   CO2 28 01/11/2022   BUN 17 01/11/2022   CREATININE 1.06 01/11/2022   GFRNONAA >60 03/16/2019   CALCIUM 9.4 01/11/2022   PROT 7.1 01/11/2022   ALBUMIN 4.6 01/11/2022   BILITOT 0.5 01/11/2022   ALKPHOS 71 01/11/2022   AST 25 01/11/2022   ALT 30 01/11/2022   ANIONGAP 7 03/16/2019   IMPRESSION AND PLAN:  #1 progressive ataxia and generalized weakness. Suspicious for cervical stenosis. He does not want to see neurosurgery at this time.  He is open  to seeing neurology so I have ordered referral today. I have also ordered MRI of the cervical, thoracic, and lumbar spines without contrast.  #2 major depressive disorder, recurrent, active, without psychosis. Discussed in depth today.  He is not interested in medication at this time. He denies being suicidal.  An After Visit Summary was printed and given to the patient.  FOLLOW UP: Return in about 1 week (around 08/23/2022) for f/u weakness/dep.  Signed:  Santiago Bumpers, MD           08/16/2022

## 2022-08-20 ENCOUNTER — Ambulatory Visit: Payer: Commercial Managed Care - PPO | Admitting: Family Medicine

## 2022-08-22 ENCOUNTER — Encounter: Payer: Self-pay | Admitting: Neurology

## 2022-08-23 ENCOUNTER — Other Ambulatory Visit (HOSPITAL_BASED_OUTPATIENT_CLINIC_OR_DEPARTMENT_OTHER): Payer: Self-pay

## 2022-08-23 ENCOUNTER — Ambulatory Visit (INDEPENDENT_AMBULATORY_CARE_PROVIDER_SITE_OTHER): Payer: Commercial Managed Care - PPO | Admitting: Family Medicine

## 2022-08-23 ENCOUNTER — Encounter: Payer: Self-pay | Admitting: Family Medicine

## 2022-08-23 VITALS — BP 135/69 | HR 58 | Wt 164.2 lb

## 2022-08-23 DIAGNOSIS — R531 Weakness: Secondary | ICD-10-CM

## 2022-08-23 DIAGNOSIS — E538 Deficiency of other specified B group vitamins: Secondary | ICD-10-CM

## 2022-08-23 LAB — COMPREHENSIVE METABOLIC PANEL
ALT: 21 U/L (ref 0–53)
AST: 20 U/L (ref 0–37)
Albumin: 4.7 g/dL (ref 3.5–5.2)
Alkaline Phosphatase: 87 U/L (ref 39–117)
BUN: 21 mg/dL (ref 6–23)
CO2: 29 mEq/L (ref 19–32)
Calcium: 9.3 mg/dL (ref 8.4–10.5)
Chloride: 101 mEq/L (ref 96–112)
Creatinine, Ser: 1.2 mg/dL (ref 0.40–1.50)
GFR: 57.02 mL/min — ABNORMAL LOW (ref >60.00)
Glucose, Bld: 85 mg/dL (ref 70–99)
Potassium: 4.8 mEq/L (ref 3.5–5.1)
Sodium: 138 mEq/L (ref 135–145)
Total Bilirubin: 0.4 mg/dL (ref 0.2–1.2)
Total Protein: 7 g/dL (ref 6.0–8.3)

## 2022-08-23 LAB — CBC WITH DIFFERENTIAL/PLATELET
Basophils Absolute: 0 10*3/uL (ref 0.0–0.1)
Basophils Relative: 0.3 % (ref 0.0–3.0)
Eosinophils Absolute: 0.1 10*3/uL (ref 0.0–0.7)
Eosinophils Relative: 1.9 % (ref 0.0–5.0)
HCT: 42.6 % (ref 39.0–52.0)
Hemoglobin: 14.5 g/dL (ref 13.0–17.0)
Lymphocytes Relative: 36.3 % (ref 12.0–46.0)
Lymphs Abs: 1.3 10*3/uL (ref 0.7–4.0)
MCHC: 34 g/dL (ref 30.0–36.0)
MCV: 104.2 fl — ABNORMAL HIGH (ref 78.0–100.0)
Monocytes Absolute: 0.2 10*3/uL (ref 0.1–1.0)
Monocytes Relative: 5.2 % (ref 3.0–12.0)
Neutro Abs: 2.1 10*3/uL (ref 1.4–7.7)
Neutrophils Relative %: 56.3 % (ref 43.0–77.0)
Platelets: 170 10*3/uL (ref 150.0–400.0)
RBC: 4.09 Mil/uL — ABNORMAL LOW (ref 4.22–5.81)
RDW: 13.7 % (ref 11.5–15.5)
WBC: 3.7 10*3/uL — ABNORMAL LOW (ref 4.0–10.5)

## 2022-08-23 LAB — VITAMIN B12: Vitamin B-12: 648 pg/mL (ref 211–911)

## 2022-08-23 LAB — TSH: TSH: 2.67 u[IU]/mL (ref 0.35–5.50)

## 2022-08-23 MED ORDER — ALPRAZOLAM 1 MG PO TABS
1.0000 mg | ORAL_TABLET | Freq: Every day | ORAL | 1 refills | Status: DC | PRN
  Filled 2022-08-23: qty 2, 2d supply, fill #0

## 2022-08-23 NOTE — Progress Notes (Signed)
OFFICE VISIT  08/23/2022  CC:  Chief Complaint  Patient presents with   Follow-up    1 week follow up. MRI is scheduled for Saturday and he has an appt with Neurology 5/16.    Patient is a 82 y.o. male who presents for 1 week follow-up generalized weakness and depression. A/P as of last visit: "#1 progressive ataxia and generalized weakness. Suspicious for cervical stenosis. He does not want to see neurosurgery at this time.  He is open to seeing neurology so I have ordered referral today. I have also ordered MRI of the cervical, thoracic, and lumbar spines without contrast.   #2 major depressive disorder, recurrent, active, without psychosis. Discussed in depth today.  He is not interested in medication at this time. He denies being suicidal."  INTERIM HX: Rosanne Ashing feels no change. Reiterates mostly lower extremity weakness and feeling of significantly unstable when walking.  No dizziness.  No sensory symptoms. He requests a Xanax to take prior to getting his MRI due to significant claustrophobia.  His MRIs are scheduled for 2 days from now.  He got a consult appointment with the a neurologist set for 09/20/2022.  Overall mood is still depressed but stable.   Past Medical History:  Diagnosis Date   Arthritis    joints/  back/  neck   Chronic headaches    due to cervical spondylosis   Chronic renal insufficiency, stage 2 (mild)    borderlines II/III   Coronary artery disease 1999   cardiologist--- dr Antoine Poche---  (per cardiology note pt refused to take asa/ statin) hx PCI w/ stenting in 1999 to LAD and 12-23-2007 x2 DES to proximal LAD;  Cath 03/2017-->  non- critical 70 - 80% mid/distal circ stenosis,  chronic small D1 80% stenosis (stable) and DES to proximal and mid LAD--   Med mgmt rec'd-->nitrates caused HAs, ACE-I caused cough.   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 Jarold Motto)    Erectile dysfunction after radical prostatectomy    History of kidney stones    History of prostate cancer 12/2014   urologist-- dr Retta Diones---   s/p Robot assisted radical prostatectomy 07/2015: lymph node involvement//   undetactable PSA since   HTN (hypertension)    ACE-I cough.  Fine on ARB.   Hx of blastomycosis 1978   s/p  right upper lobectomy   Hyperlipidemia    NASH (nonalcoholic steatohepatitis) 06/2011   normal LFTS  /  Hep B and C testing neg.  Mild fatty liver on CT 2020   Nocturia    Pulmonary nodules    small, picked up on CT ab/pelv for abd pains/flank pains-->pt quit smoking 40 yrs ago.  Low risk for lung ca so no f/u CT needed.   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   Tinnitus    Wears glasses    Wears hearing aid in both ears     Past Surgical History:  Procedure Laterality Date   ANTERIOR CERVICAL DECOMP/DISCECTOMY FUSION  1982   C6--7   APPENDECTOMY  1953   CARDIAC CATHETERIZATION  02/27/2000    by dr hochrein ---  single vessel w/ preserved LVSF   CORONARY ANGIOPLASTY WITH STENT  PLACEMENT  12/23/2007   by Dr End--- PCI w/ DES x2 to  pLAD   CORONARY STENT PLACEMENT  1999   PTCA w/ stenting to LAd   CYSTOSCOPY WITH RETROGRADE PYELOGRAM, URETEROSCOPY AND STENT PLACEMENT Left 05/10/2018   Procedure: CYSTOSCOPY WITH LEFT RETROGRADE PYELOGRAM, LEFT URETEROSCOPY AND LEFT URETERAL STENT PLACEMENT;  Surgeon: Sebastian Ache, MD;  Location: [facility];  Service: Urology;  Laterality: Left;   HOLMIUM LASER APPLICATION Left 05/10/2018   Procedure: HOLMIUM LASER APPLICATION;  Surgeon: Sebastian Ache, MD;  Location: [facility];  Service: Urology;  Laterality: Left;   INCISIONAL HERNIA REPAIR N/A 03/19/2019   Procedure: LAPAROSCOPIC INCISIONAL HERNIA REPAIR WITH MESH, RECURRENT UMBILICAL HERNIA REPAIR;   Surgeon: Karie Soda, MD;  Location: [facility];  Service: General;  Laterality: N/A;   INGUINAL HERNIA REPAIR Right 03/19/2019   Procedure: LAPAROSCOPIC BILATERAL FEMORAL AND INGUINAL HERNIA REPAIR WITH MESH, LYSIS OF ADHESIONS;  Surgeon: Karie Soda, MD;  Location: [facility];  Service: General;  Laterality: Right;   LEFT HEART CATH AND CORONARY ANGIOGRAPHY N/A 03/21/2017   Stents patent; mod circ dz, nothing for intervention--imdur added.  Procedure: LEFT HEART CATH AND CORONARY ANGIOGRAPHY;  Surgeon: Yvonne Kendall, MD;  Location: [facility];  Service: Cardiovascular;  Laterality: N/A;   LUMBAR SPINE SURGERY  1978   L5--S1   LUNG LOBECTOMY Right 1978   right upper lobectomy for blastomycosis   LYMPHADENECTOMY Bilateral 07/14/2015   Procedure: LYMPHADENECTOMY;  Surgeon: Heloise Purpura, MD;  Location: [facility];  Service: Urology;  Laterality: Bilateral;   ROBOT ASSISTED LAPAROSCOPIC RADICAL PROSTATECTOMY N/A 07/14/2015   One positive pelvic LN.  Procedure: XI ROBOTIC ASSISTED LAPAROSCOPIC RADICAL PROSTATECTOMY LEVEL 2;  Surgeon: Heloise Purpura, MD;  Location: [facility];  Service: Urology;  Laterality: N/A;   ROTATOR CUFF REPAIR  2007   UMBILICAL HERNIA REPAIR  07/14/2015   Procedure: HERNIA REPAIR UMBILICAL ADULT;  Surgeon: Heloise Purpura, MD;  Location: [facility];  Service: Urology;;    Outpatient Medications Prior to Visit  Medication Sig Dispense Refill   acetaminophen (TYLENOL) 500 MG tablet Take 500 mg by mouth every 6 (six) hours as needed.     carisoprodol (SOMA) 350 MG tablet Take 1 tablet by mouth 4 times daily as needed for muscle spasms 60 tablet 3   Cholecalciferol (VITAMIN D3) 5000 units CAPS Take 5,000 Units by mouth daily.     Coenzyme Q10 (COQ10) 100 MG CAPS Take 100 mg by mouth every evening.     Multiple Vitamin (MULTIVITAMIN) tablet Take 1 tablet by mouth daily.     Multiple Vitamins-Minerals (ICAPS AREDS 2 PO) Take 1 capsule by mouth daily.     niacin (VITAMIN B3) 500 MG  tablet Take 500 mg by mouth at bedtime.     nitroGLYCERIN (NITROSTAT) 0.4 MG SL tablet Place 1 tablet (0.4 mg total) under the tongue every 5 (five) minutes as needed for chest pain. 25 tablet 3   propranolol (INDERAL) 10 MG tablet Take 1-2 tablets (10-20 mg total) by mouth 2 (two) times daily as needed for anxiety / panic 30 tablet 1   Facility-Administered Medications Prior to Visit  Medication Dose Route Frequency Provider Last Rate Last Admin   gentamicin (GARAMYCIN) 350 mg in dextrose 5 % 50 mL IVPB  5 mg/kg Intravenous 30 min Pre-Op Rexford Maus, RPH        Allergies  Allergen Reactions   Ace Inhibitors Cough   Imdur [Isosorbide Dinitrate] Other (See Comments)    Headache  Review of Systems As per HPI  PE:    08/23/2022   11:07 AM 08/16/2022    3:42 PM 04/24/2022   10:35 AM  Vitals with BMI  Height   5\' 9"   Weight 164 lbs 3 oz 162 lbs 13 oz 165 lbs 13 oz  BMI   24.47  Systolic 135 115 798  Diastolic 69 68 67  Pulse 58 60 57     Physical Exam  Gen: Alert, well appearing.  Patient is oriented to person, place, time, and situation. AFFECT: pleasant, lucid thought and speech. No further exam today  LABS:  Last CBC Lab Results  Component Value Date   WBC 3.2 (L) 07/10/2021   HGB 15.2 07/10/2021   HCT 45.1 07/10/2021   MCV 100.5 (H) 07/10/2021   MCH 32.3 06/03/2019   RDW 14.6 07/10/2021   PLT 133.0 (L) 07/10/2021   Last metabolic panel Lab Results  Component Value Date   GLUCOSE 94 01/11/2022   NA 139 01/11/2022   K 4.8 01/11/2022   CL 104 01/11/2022   CO2 28 01/11/2022   BUN 17 01/11/2022   CREATININE 1.06 01/11/2022   GFRNONAA >60 03/16/2019   CALCIUM 9.4 01/11/2022   PROT 7.1 01/11/2022   ALBUMIN 4.6 01/11/2022   BILITOT 0.5 01/11/2022   ALKPHOS 71 01/11/2022   AST 25 01/11/2022   ALT 30 01/11/2022   ANIONGAP 7 03/16/2019   Lab Results  Component Value Date   TSH 3.080 03/15/2017   IMPRESSION AND PLAN:  1 progressive ataxia and  generalized weakness (legs >>arms). Suspicious for cervical stenosis. He does not want to see neurosurgery at this time (he is established with Dr. Yetta Barre). He has an initial neurology consultation set for 09/20/2022. MRIs of cervical, thoracic, and lumbar spines without contrast are set to be done in 2 days. Alprazolam 1 mg prescribed today to be taken about 1 hour prior to MRI.   #2 major depressive disorder, recurrent, active, without psychosis. Overall stable.  He is not interested in medication at this time. He denies being suicidal.  Will check general lab panel today: CBC, c-Met, TSH, B12 level. (Of note, he takes a vit b12 and mixed b vitamin tab daily)  An After Visit Summary was printed and given to the patient.  FOLLOW UP: Return for f/u to be determined based on results of imaging.  Signed:  Santiago Bumpers, MD           08/23/2022

## 2022-08-25 ENCOUNTER — Ambulatory Visit (HOSPITAL_BASED_OUTPATIENT_CLINIC_OR_DEPARTMENT_OTHER)
Admission: RE | Admit: 2022-08-25 | Discharge: 2022-08-25 | Disposition: A | Discharge status: Home / Self Care | Payer: Commercial Managed Care - PPO | Source: Ambulatory Visit | Attending: Family Medicine | Admitting: Family Medicine

## 2022-08-25 ENCOUNTER — Encounter (HOSPITAL_BASED_OUTPATIENT_CLINIC_OR_DEPARTMENT_OTHER): Payer: Self-pay

## 2022-08-25 DIAGNOSIS — M4802 Spinal stenosis, cervical region: Secondary | ICD-10-CM | POA: Insufficient documentation

## 2022-08-25 DIAGNOSIS — R29898 Other symptoms and signs involving the musculoskeletal system: Secondary | ICD-10-CM

## 2022-08-25 DIAGNOSIS — M549 Dorsalgia, unspecified: Secondary | ICD-10-CM | POA: Diagnosis not present

## 2022-08-25 DIAGNOSIS — M48062 Spinal stenosis, lumbar region with neurogenic claudication: Secondary | ICD-10-CM | POA: Diagnosis not present

## 2022-08-25 DIAGNOSIS — M4316 Spondylolisthesis, lumbar region: Secondary | ICD-10-CM | POA: Diagnosis not present

## 2022-08-25 DIAGNOSIS — M4312 Spondylolisthesis, cervical region: Secondary | ICD-10-CM | POA: Diagnosis not present

## 2022-08-25 DIAGNOSIS — M545 Low back pain, unspecified: Secondary | ICD-10-CM | POA: Diagnosis not present

## 2022-08-29 ENCOUNTER — Encounter: Payer: Self-pay | Admitting: Family Medicine

## 2022-09-03 ENCOUNTER — Encounter: Payer: Self-pay | Admitting: Family Medicine

## 2022-09-03 ENCOUNTER — Observation Stay (HOSPITAL_BASED_OUTPATIENT_CLINIC_OR_DEPARTMENT_OTHER)
Admission: EM | Admit: 2022-09-03 | Discharge: 2022-09-04 | Disposition: A | Discharge status: Home / Self Care | Payer: Commercial Managed Care - PPO | Attending: Student | Admitting: Student

## 2022-09-03 ENCOUNTER — Emergency Department (HOSPITAL_BASED_OUTPATIENT_CLINIC_OR_DEPARTMENT_OTHER): Payer: Commercial Managed Care - PPO

## 2022-09-03 ENCOUNTER — Encounter (HOSPITAL_BASED_OUTPATIENT_CLINIC_OR_DEPARTMENT_OTHER): Payer: Self-pay | Admitting: Emergency Medicine

## 2022-09-03 ENCOUNTER — Other Ambulatory Visit: Payer: Self-pay

## 2022-09-03 ENCOUNTER — Observation Stay (HOSPITAL_COMMUNITY): Payer: Commercial Managed Care - PPO

## 2022-09-03 DIAGNOSIS — R4789 Other speech disturbances: Secondary | ICD-10-CM | POA: Diagnosis not present

## 2022-09-03 DIAGNOSIS — R531 Weakness: Secondary | ICD-10-CM | POA: Diagnosis not present

## 2022-09-03 DIAGNOSIS — M6281 Muscle weakness (generalized): Secondary | ICD-10-CM

## 2022-09-03 DIAGNOSIS — I2510 Atherosclerotic heart disease of native coronary artery without angina pectoris: Secondary | ICD-10-CM | POA: Insufficient documentation

## 2022-09-03 DIAGNOSIS — Z8546 Personal history of malignant neoplasm of prostate: Secondary | ICD-10-CM | POA: Insufficient documentation

## 2022-09-03 DIAGNOSIS — R4781 Slurred speech: Secondary | ICD-10-CM | POA: Insufficient documentation

## 2022-09-03 DIAGNOSIS — R278 Other lack of coordination: Secondary | ICD-10-CM

## 2022-09-03 DIAGNOSIS — R479 Unspecified speech disturbances: Secondary | ICD-10-CM | POA: Diagnosis not present

## 2022-09-03 DIAGNOSIS — R262 Difficulty in walking, not elsewhere classified: Secondary | ICD-10-CM

## 2022-09-03 DIAGNOSIS — R42 Dizziness and giddiness: Secondary | ICD-10-CM

## 2022-09-03 DIAGNOSIS — I129 Hypertensive chronic kidney disease with stage 1 through stage 4 chronic kidney disease, or unspecified chronic kidney disease: Secondary | ICD-10-CM | POA: Insufficient documentation

## 2022-09-03 DIAGNOSIS — N182 Chronic kidney disease, stage 2 (mild): Secondary | ICD-10-CM | POA: Diagnosis not present

## 2022-09-03 DIAGNOSIS — Z955 Presence of coronary angioplasty implant and graft: Secondary | ICD-10-CM | POA: Diagnosis not present

## 2022-09-03 DIAGNOSIS — R29818 Other symptoms and signs involving the nervous system: Secondary | ICD-10-CM | POA: Diagnosis not present

## 2022-09-03 DIAGNOSIS — Z7982 Long term (current) use of aspirin: Secondary | ICD-10-CM | POA: Insufficient documentation

## 2022-09-03 LAB — RAPID URINE DRUG SCREEN, HOSP PERFORMED
Amphetamines: NOT DETECTED
Barbiturates: NOT DETECTED
Benzodiazepines: NOT DETECTED
Cocaine: NOT DETECTED
Opiates: NOT DETECTED
Tetrahydrocannabinol: POSITIVE — AB

## 2022-09-03 LAB — COMPREHENSIVE METABOLIC PANEL
ALT: 26 U/L (ref 0–44)
AST: 23 U/L (ref 15–41)
Albumin: 4.3 g/dL (ref 3.5–5.0)
Alkaline Phosphatase: 86 U/L (ref 38–126)
Anion gap: 11 (ref 5–15)
BUN: 22 mg/dL (ref 8–23)
CO2: 26 mmol/L (ref 22–32)
Calcium: 8.9 mg/dL (ref 8.9–10.3)
Chloride: 99 mmol/L (ref 98–111)
Creatinine, Ser: 1.02 mg/dL (ref 0.61–1.24)
GFR, Estimated: >60 mL/min (ref >60)
Glucose, Bld: 116 mg/dL — ABNORMAL HIGH (ref 70–99)
Potassium: 3.9 mmol/L (ref 3.5–5.1)
Sodium: 136 mmol/L (ref 135–145)
Total Bilirubin: 0.7 mg/dL (ref 0.3–1.2)
Total Protein: 7.2 g/dL (ref 6.5–8.1)

## 2022-09-03 LAB — LIPID PANEL
Cholesterol: 163 mg/dL (ref 0–200)
HDL: 50 mg/dL (ref >40)
LDL Cholesterol: 92 mg/dL (ref 0–99)
Total CHOL/HDL Ratio: 3.3 RATIO
Triglycerides: 104 mg/dL (ref <150)
VLDL: 21 mg/dL (ref 0–40)

## 2022-09-03 LAB — URINALYSIS, ROUTINE W REFLEX MICROSCOPIC
Bilirubin Urine: NEGATIVE
Glucose, UA: NEGATIVE mg/dL
Hgb urine dipstick: NEGATIVE
Ketones, ur: NEGATIVE mg/dL
Leukocytes,Ua: NEGATIVE
Nitrite: NEGATIVE
Protein, ur: NEGATIVE mg/dL
Specific Gravity, Urine: 1.01 (ref 1.005–1.030)
pH: 6.5 (ref 5.0–8.0)

## 2022-09-03 LAB — DIFFERENTIAL
Abs Immature Granulocytes: 0.01 10*3/uL (ref 0.00–0.07)
Basophils Absolute: 0 10*3/uL (ref 0.0–0.1)
Basophils Relative: 0 %
Eosinophils Absolute: 0.1 10*3/uL (ref 0.0–0.5)
Eosinophils Relative: 1 %
Immature Granulocytes: 0 %
Lymphocytes Relative: 18 %
Lymphs Abs: 1 10*3/uL (ref 0.7–4.0)
Monocytes Absolute: 0.3 10*3/uL (ref 0.1–1.0)
Monocytes Relative: 5 %
Neutro Abs: 4.3 10*3/uL (ref 1.7–7.7)
Neutrophils Relative %: 76 %

## 2022-09-03 LAB — CBG MONITORING, ED: Glucose-Capillary: 100 mg/dL — ABNORMAL HIGH (ref 70–99)

## 2022-09-03 LAB — CBC
HCT: 41.7 % (ref 39.0–52.0)
Hemoglobin: 14.3 g/dL (ref 13.0–17.0)
MCH: 35 pg — ABNORMAL HIGH (ref 26.0–34.0)
MCHC: 34.3 g/dL (ref 30.0–36.0)
MCV: 102 fL — ABNORMAL HIGH (ref 80.0–100.0)
Platelets: 120 10*3/uL — ABNORMAL LOW (ref 150–400)
RBC: 4.09 MIL/uL — ABNORMAL LOW (ref 4.22–5.81)
RDW: 12.8 % (ref 11.5–15.5)
WBC: 5.7 10*3/uL (ref 4.0–10.5)
nRBC: 0 % (ref 0.0–0.2)

## 2022-09-03 LAB — AMMONIA: Ammonia: <10 umol/L (ref 9–35)

## 2022-09-03 LAB — TSH: TSH: 3.373 u[IU]/mL (ref 0.350–4.500)

## 2022-09-03 LAB — CK: Total CK: 83 U/L (ref 49–397)

## 2022-09-03 LAB — ETHANOL: Alcohol, Ethyl (B): <10 mg/dL (ref <10)

## 2022-09-03 MED ORDER — ACETAMINOPHEN 650 MG RE SUPP: 650.0000 mg | Freq: Four times a day (QID) | RECTAL | Status: DC | PRN

## 2022-09-03 MED ORDER — ALPRAZOLAM 0.5 MG PO TABS
0.5000 mg | ORAL_TABLET | Freq: Once | ORAL | Status: AC | PRN
  Administered 2022-09-03: 0.5 mg via ORAL
  Filled 2022-09-03: qty 1

## 2022-09-03 MED ORDER — ACETAMINOPHEN 325 MG PO TABS: 650.0000 mg | ORAL_TABLET | Freq: Four times a day (QID) | ORAL | Status: DC | PRN

## 2022-09-03 MED ORDER — ORAL CARE MOUTH RINSE: 15.0000 mL | OROMUCOSAL | Status: DC | PRN

## 2022-09-03 MED ORDER — HEPARIN SODIUM (PORCINE) 5000 UNIT/ML IJ SOLN
5000.0000 [IU] | Freq: Three times a day (TID) | INTRAMUSCULAR | Status: DC
  Administered 2022-09-03: 5000 [IU] via SUBCUTANEOUS
  Filled 2022-09-03 (×2): qty 1

## 2022-09-03 MED ORDER — ONDANSETRON HCL 4 MG/2ML IJ SOLN: 4.0000 mg | Freq: Four times a day (QID) | INTRAMUSCULAR | Status: DC | PRN

## 2022-09-03 MED ORDER — ONDANSETRON HCL 4 MG PO TABS: 4.0000 mg | ORAL_TABLET | Freq: Four times a day (QID) | ORAL | Status: DC | PRN

## 2022-09-03 MED ORDER — MELATONIN 5 MG PO TABS: 10.0000 mg | ORAL_TABLET | Freq: Every evening | ORAL | Status: DC | PRN

## 2022-09-03 MED ORDER — METHOCARBAMOL 1000 MG/10ML IJ SOLN: 500.0000 mg | Freq: Four times a day (QID) | INTRAVENOUS | Status: DC | PRN

## 2022-09-03 NOTE — ED Notes (Signed)
ED Provider at bedside. 

## 2022-09-03 NOTE — Assessment & Plan Note (Deleted)
Patient states that he has had worsening handwriting for several weeks now.  He is finds that his handwriting is overall much smaller in characterization and it is becoming harder and harder to read.  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 

## 2022-09-03 NOTE — Subjective & Objective (Signed)
CC: weakness, word finding, gait imbalance HPI: 82 year old white male prior history of coronary disease status post stent not taking aspirin or statin due to his preference, hypertension, presents to the ER today with intermittent slurred speech, gait imbalance, and truncal/proximal muscle weakness.  This has been ongoing for several weeks now.  Patient to call his family doctor told him about the intermittent arm weakness and his word finding.  ED want him to go to the ER for evaluation.  Patient thinks that sometime last year, he had laid down about 50 feet of sidewalk concrete by himself.  He began having severe headaches afterwards.  He went to see several specialists including neurosurgery.  Ultimately ended getting a occipital nerve injection by his family doctor which did relieve the headache.  His headache came back several weeks later and had another occipital nerve injection.  This time this did not work.  Since that time, he has had intermittent headaches.  He has also noticed increasing difficulty with walking.  He has a wide-based gait now.  He is shuffling his feet.  He states that he has difficulty going from sitting to standing.  He needs to brace himself or he will fall backwards after he stands.  He notices that he is stronger in the morning than in the evening.  He is usually able to walk up and down the stairs without difficulty morning but finds that he has much more difficulty with walking up the stairs in the evening.  He denies any diplopia.  No bowel or bladder incontinence.  He denies any chest pain or shortness of breath.  Due to his persistent symptoms of gait imbalance, headache, generalized weakness, a neurology referral was made.  He is actually scheduled to see them on Sep 05, 2022.  He is also had a MRI of the cervical, thoracic and lumbar spine performed.  He has had a prior C6-C7 fusion.  He has chronic bilateral neural foraminal narrowing and mild spinal canal stenosis.   This is not significantly changed compared to his MRI in July 2023.  His B12 level was normal at 648 on August 23, 2022. TSH was normal at 2.67  CBC showed a white count of 5.7, hemoglobin of 14.3, platelets of 120.  MCV elevated at 102.  Sodium 136, potassium 3.9, bicarb 26, BUN 22, creatinine 1.0  UA was unremarkable. UDS positive for THC.  CT head without contrast demonstrated no acute intracranial abnormality.  [facility] hospitalist contacted for admission.

## 2022-09-03 NOTE — Progress Notes (Signed)
Pt has arrived to [address]. Alert and oriented x 4, identified appropriately.  No orders on file, so admission paged and MD assigned.  VS stable, denied chest pain, SOB, and no signs of acute distress. Pt ambulatory wit unsteady gait, bed alarm activated and pt instructed to call for assistance. Call bell left within reach. Cardiac monitor in place, and CCMD notified.  Will continue to monitor pt and terat per MD orders.

## 2022-09-03 NOTE — Assessment & Plan Note (Signed)
Patient states that he is has difficulty with some word finding.  This is not all the time.  Clearly this is causing him much emotional distress.  MRI brain to rule out stroke.

## 2022-09-03 NOTE — Assessment & Plan Note (Signed)
Patient states that he has had worsening handwriting for several weeks now.  He is finds that his handwriting is overall much smaller in characterization and it is becoming harder and harder to read.  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

## 2022-09-03 NOTE — Progress Notes (Unsigned)
Initial neurology clinic note  SERVICE DATE: 09/05/22  Reason for Evaluation: Consultation requested by McGowen, Maryjean Morn, MD for an opinion regarding imbalance. My final recommendations will be communicated back to the requesting physician by way of shared medical record or letter to requesting physician via [place] mail.  HPI: This is Mr. William Montgomery, a 82 y.o. right-handed male with a medical history of cervical and lumbar degenerative disc disease s/p ACDF (C6-7 and L5-S1), OA, chronic headaches, CAD, HTN, CKD, HLD, NASH, hearing loss, prostate cancer who presents to neurology clinic with the chief complaint of imbalance. The patient is accompanied by wife.  He had a headache on his right side of head/neck, behind the ear that was severe in earlier in 2023. He had a CT head and cervical spine. Head was unremarkable, but cervical spine showed severe cervical spondylosis. He had dry needling and injections in the neck without benefit in pain. He was then diagnosed with occipital neuralgia by PCP. He got an injection with immediate relief (12/2021). The headache came back after a few weeks. When he got another shot, it did not help. Imbalance started in 02/2022. He saw his PCP who recommended exercise and monitoring. The symptoms continued to progress though. He feels like his balance is getting worse on a daily basis. He is shuffling while walking to get around. He endorses falls about 3 falls in the last month, working in yard. It is more when leaning over or to the side.  He feels he is getting worse rapidly. He has noticed slurred speech and word finding difficulty a couple of months ago that he feels is getting worse recently. He has difficulty writing, and his signature looks small and not like his signature normally would. He has difficulty getting in and out of chairs. Symptoms gets worse as the day goes on usually. When he gets close to a step, he will find himself stopping, having to think  about picking his leg up and having difficulty with this. He is very emotional, including tearing up during the history taking. Wife says this is not like him. He feels more depressed due to symptoms and has a lot of anxiety. He was prescribed propranolol for this but he has never taken it.  Per wife, patient can have leg jerking when falling asleep or when in a deep sleep. There is no clear REM sleep behavior though. Patient does not feel like he sleeps well though. He has difficulty falling asleep (was taking CBD to help with this). He denies any changes to smell.  He denies ptosis, diplopia, difficulty chewing or swallowing. He denies orthopnea. He feels weaker but denies clear muscle atrophy, cramps, or twitching.  Pseudobulbar affect is present.  The patient does not report symptoms referable to autonomic dysfunction including impaired sweating, heat or cold intolerance, excessive mucosal dryness, gastroparetic early satiety, postprandial abdominal bloating, constipation, bowel or bladder dyscontrol, or syncope/presyncope/orthostatic intolerance.  The patient has not noticed any recent skin rashes nor does he report any constitutional symptoms like fever, night sweats, anorexia or unintentional weight loss.  Patient was advised to go to ED on 09/03/22 by PCP. Patient was admitted for more expedited work up from 09/03/22-09/04/22. 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 Labs were significant for CRP of 9.4, normal ESR, A1c of 5.2, normal TSH, CBC with MCV of 102 (B12 on 08/23/22 was 648). Other labs are pending. MRI brain showed no acute process with chronic small  vessel ischemia and volume loss.  EtOH use: Very rare  Restrictive diet? No Family history of neuropathy/myopathy/neurologic disease? Brother with seizure disorder who passed away recently  Patient is most concerned he is getting dementia, parkinson's disease, or  ALS.   MEDICATIONS:  Outpatient Encounter Medications as of 09/05/2022  Medication Sig   carbidopa-levodopa (SINEMET IR) 25-100 MG tablet Take 0.5 tablets by mouth 3 (three) times daily.   carisoprodol (SOMA) 350 MG tablet Take 1 tablet by mouth 4 times daily as needed for muscle spasms   Cholecalciferol (VITAMIN D3) 5000 units CAPS Take 5,000 Units by mouth daily.   escitalopram (LEXAPRO) 10 MG tablet Take 1 tablet (10 mg total) by mouth daily.   Multiple Vitamins-Minerals (ICAPS AREDS 2 PO) Take 1 capsule by mouth daily.   nitroGLYCERIN (NITROSTAT) 0.4 MG SL tablet Place 1 tablet (0.4 mg total) under the tongue every 5 (five) minutes as needed for chest pain.   acetaminophen (TYLENOL) 500 MG tablet Take 500 mg by mouth every 6 (six) hours as needed. (Patient not taking: Reported on 09/05/2022)   ALPRAZolam (XANAX) 1 MG tablet Take 1 tablet (1 mg total) by mouth daily as needed. (Patient not taking: Reported on 09/05/2022)   Coenzyme Q10 (COQ10) 100 MG CAPS Take 100 mg by mouth every evening. (Patient not taking: Reported on 09/05/2022)   niacin (VITAMIN B3) 500 MG tablet Take 500 mg by mouth at bedtime. (Patient not taking: Reported on 09/05/2022)   propranolol (INDERAL) 10 MG tablet Take 1-2 tablets (10-20 mg total) by mouth 2 (two) times daily as needed for anxiety / panic (Patient not taking: Reported on 09/05/2022)   [DISCONTINUED] Multiple Vitamin (MULTIVITAMIN) tablet Take 1 tablet by mouth daily.   Facility-Administered Encounter Medications as of 09/05/2022  Medication   gentamicin (GARAMYCIN) 350 mg in dextrose 5 % 50 mL IVPB    PAST MEDICAL HISTORY: Past Medical History:  Diagnosis Date   Arthritis    joints/  back/  neck   Chronic headaches    due to cervical spondylosis   Chronic renal insufficiency, stage 2 (mild)    borderlines II/III   Coronary artery disease 1999   cardiologist--- dr Antoine Poche---  (per cardiology note pt refused to take asa/ statin) hx PCI w/ stenting in 1999 to  LAD and 12-23-2007 x2 DES to proximal LAD;  Cath 03/2017-->  non- critical 70 - 80% mid/distal circ stenosis,  chronic small D1 80% stenosis (stable) and DES to proximal and mid LAD--   Med mgmt rec'd-->nitrates caused HAs, ACE-I caused cough.   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 Jarold Motto)   Erectile dysfunction after radical prostatectomy    History of kidney stones    History of prostate cancer 12/2014   urologist-- dr Retta Diones---   s/p Robot assisted radical prostatectomy 07/2015: lymph node involvement//   undetactable PSA since   HTN (hypertension)    ACE-I cough.  Fine on ARB.   Hx of blastomycosis 1978   s/p  right upper lobectomy   Hyperlipidemia    NASH (nonalcoholic steatohepatitis) 06/2011   normal LFTS  /  Hep B and C testing neg.  Mild fatty liver on CT 2020   Nocturia    Pulmonary nodules    small, picked up on CT ab/pelv for abd pains/flank pains-->pt quit smoking 40 yrs ago.  Low risk for lung ca so no f/u CT  needed.   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   Tinnitus    Wears glasses    Wears hearing aid in both ears     PAST SURGICAL HISTORY: Past Surgical History:  Procedure Laterality Date   ANTERIOR CERVICAL DECOMP/DISCECTOMY FUSION  1982   C6--7   APPENDECTOMY  1953   CARDIAC CATHETERIZATION  02/27/2000   @[facility]  by dr hochrein ---  single vessel w/ preserved LVSF   CORONARY ANGIOPLASTY WITH STENT PLACEMENT  12/23/2007   by Dr End--- PCI w/ DES x2 to  pLAD   CORONARY STENT PLACEMENT  1999   PTCA w/ stenting to LAd   CYSTOSCOPY WITH RETROGRADE PYELOGRAM, URETEROSCOPY AND STENT PLACEMENT Left 05/10/2018   Procedure: CYSTOSCOPY WITH LEFT RETROGRADE PYELOGRAM, LEFT URETEROSCOPY AND LEFT URETERAL  STENT PLACEMENT;  Surgeon: Sebastian Ache, MD;  Location: [facility];  Service: Urology;  Laterality: Left;   HOLMIUM LASER APPLICATION Left 05/10/2018   Procedure: HOLMIUM LASER APPLICATION;  Surgeon: Sebastian Ache, MD;  Location: [facility];  Service: Urology;  Laterality: Left;   INCISIONAL HERNIA REPAIR N/A 03/19/2019   Procedure: LAPAROSCOPIC INCISIONAL HERNIA REPAIR WITH MESH, RECURRENT UMBILICAL HERNIA REPAIR;  Surgeon: Karie Soda, MD;  Location: [facility];  Service: General;  Laterality: N/A;   INGUINAL HERNIA REPAIR Right 03/19/2019   Procedure: LAPAROSCOPIC BILATERAL FEMORAL AND INGUINAL HERNIA REPAIR WITH MESH, LYSIS OF ADHESIONS;  Surgeon: Karie Soda, MD;  Location: [facility];  Service: General;  Laterality: Right;   LEFT HEART CATH AND CORONARY ANGIOGRAPHY N/A 03/21/2017   Stents patent; mod circ dz, nothing for intervention--imdur added.  Procedure: LEFT HEART CATH AND CORONARY ANGIOGRAPHY;  Surgeon: Yvonne Kendall, MD;  Location: [facility];  Service: Cardiovascular;  Laterality: N/A;   LUMBAR SPINE SURGERY  1978   L5--S1   LUNG LOBECTOMY Right 1978   right upper lobectomy for blastomycosis   LYMPHADENECTOMY Bilateral 07/14/2015   Procedure: LYMPHADENECTOMY;  Surgeon: Heloise Purpura, MD;  Location: [facility];  Service: Urology;  Laterality: Bilateral;   ROBOT ASSISTED LAPAROSCOPIC RADICAL PROSTATECTOMY N/A 07/14/2015   One positive pelvic LN.  Procedure: XI ROBOTIC ASSISTED LAPAROSCOPIC RADICAL PROSTATECTOMY LEVEL 2;  Surgeon: Heloise Purpura, MD;  Location: [facility];  Service: Urology;  Laterality: N/A;   ROTATOR CUFF REPAIR  2007   UMBILICAL HERNIA REPAIR  07/14/2015   Procedure: HERNIA REPAIR UMBILICAL ADULT;  Surgeon: Heloise Purpura, MD;  Location: [facility];  Service: Urology;;    ALLERGIES: Allergies  Allergen Reactions   Ace Inhibitors Cough   Imdur [Isosorbide Dinitrate] Other (See Comments)    Headache    FAMILY HISTORY: Family History  Problem Relation Age of Onset    Cervical cancer Mother        deceased   Cirrhosis Father    Colon cancer Neg Hx     SOCIAL HISTORY: Social History   Tobacco Use   Smoking status: Former    Packs/day: 0.50    Years: 20.00    Additional pack years: 0.00    Total pack years: 10.00    Types: Cigarettes    Quit date: 1978    Years since quitting: 46.3   Smokeless tobacco: Former    Types: Chew    Quit date: 2000   Tobacco comments:  Scoal  Vaping Use   Vaping Use: Never used  Substance Use Topics   Alcohol use: Yes    Comment: seldom   Drug use: Never   Social History   Social History Narrative   Married, 2 adult children ([place] and [place]).   Lives in [place].     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   Tob (smoke and chew) x 20 yrs, quit approx 1980s.   Alcohol: very rare.   One cup coffee each morning.   Exercise: walks 3 miles about 4 times per week.   Active lifestyle   Two story home   Right handed     OBJECTIVE: PHYSICAL EXAM: BP 131/79   Pulse 60   Ht 5\' 9"  (1.753 m)   Wt 160 lb (72.6 kg)   SpO2 95%   BMI 23.63 kg/m   Orthostatic vitals with no significant change in BP or pulse.  General: General appearance: Awake and alert. No distress. Cooperative with exam.  Skin: No obvious rash or jaundice. HEENT: Atraumatic. Anicteric. Lungs: Non-labored breathing on room air  Extremities: No edema.  Neurological: Mental Status: Alert. Speech with delays and word finding difficulties at times. Positive for pseudobulbar affect Cranial Nerves: CNII: No RAPD. Visual fields grossly intact. CNIII, IV, VI: PERRL. No nystagmus. EOMI. CN V: Facial sensation intact bilaterally to fine touch. Masseter clench strong. Jaw jerk negative. CN VII: Facial muscles symmetric and strong. No ptosis at rest or with sustained up gaze. CN VIII: Hearing grossly intact bilaterally. CN IX: No hypophonia. CN X: Palate elevates symmetrically. CN XI: Full strength shoulder shrug  bilaterally. CN XII: Tongue protrusion full and midline. No atrophy or fasciculations. Mild dysarthria Motor: Tone is increased mildly in LUE. No fasciculations in any extremities. No atrophy. Strength is 5/5 in bilateral upper and lower extremities. Reflexes:  Right Left   Bicep 3+ 3+   Tricep 3+ 3+   BrRad 3+ 3+   Knee 3+ 3+   Ankle 3+ 3+    Pathological Reflexes: Babinski: mute response bilaterally Hoffman: absent bilaterally Troemner: absent bilaterally Palmomental: equivocal Facial: positive bilaterally Midline tap: positive Sensation: Pinprick: Intact except in right lower leg, medial aspect of calf which felt dull Proprioception: Intact in bilateral great toes. Coordination: Intact finger-to- nose-finger bilaterally. Romberg negative. Difficulty with RAM in left upper extremity. Otherwise normal. Gait: Able to rise from chair with arms crossed unassisted. Decreased arm swing. Shuffling steps. No clear freezing. No clear en bloc turning.  Lab and Test Review: Internal labs: 09/04/22: HIV non-reactive Multiple myeloma panel pending ANA negative CRP: 9.4 ESR: 15 HbA1c: 5.2 Vit E, copper, zinc pending  09/03/22: Folate pending Lipid panel unremarkable Aldolase wnl (3.6) CK: 83 TSH: 3.373 AChR ab pending UA unremarkable Urine drug screen positive for tetrahydrocannabinol EtOH: negative Ammonia < 10 CMP unremarkable CBC significant for MCV of 102, platelets 120  08/23/22: B12: 648 CMP: Cr 1.20, GFR 57.02 TSH: 2.67 CBC: MCV 104.2  Imaging: MRI brain wo contrast (09/03/22): FINDINGS: Brain: No acute infarct, mass effect or extra-axial collection. No acute or chronic hemorrhage. There is multifocal hyperintense T2-weighted signal within the white matter. Generalized volume loss. The midline structures are normal.   Vascular: Major flow voids are preserved.   Skull and upper cervical spine: Normal calvarium and skull base. Visualized upper cervical spine  and soft tissues are normal.   Sinuses/Orbits:No paranasal sinus fluid levels or advanced mucosal thickening. No mastoid or middle ear effusion. Normal  orbits.   IMPRESSION: 1. No acute intracranial abnormality. 2. 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  CT head wo contrast (09/03/22): FINDINGS: Brain: There is no acute intracranial hemorrhage, extra-axial fluid collection, or acute infarct   There is background parenchymal volume loss with prominence of the ventricular system and extra-axial CSF spaces. The ventricles are slightly increased in size compared to the head CT from 2023 but without evidence of transependymal flow of CSF to suggest acute hydrocephalus. Gray-white differentiation is preserved. Mild hypodensity in the supratentorial white matter likely reflects sequela of mild chronic small-vessel ischemic change.   The pituitary and suprasellar region are normal. There is no mass lesion there is no mass effect or midline shift.   Vascular: No hyperdense vessel or unexpected calcification.   Skull: Normal. Negative for fracture or focal lesion.   Sinuses/Orbits: The imaged paranasal sinuses are clear. The imaged globes and orbits are unremarkable.   Other: None.   IMPRESSION: No acute intracranial pathology.  MRI cervical, thoracic, and lumbar spine wo contrast (08/25/22): FINDINGS: MRI CERVICAL SPINE FINDINGS   Alignment: Straightening of the normal cervical lordosis. Trace anterolisthesis of C4 on C5 and C7 on T1.   Vertebrae: No acute fracture, suspicious osseous lesion, or evidence of discitis. Redemonstrated fusion of C6 and C7, favored to be the sequela of prior surgery. Unchanged vertebral body height loss C5.   Cord: Normal signal and morphology.   Posterior Fossa, vertebral arteries, paraspinal tissues: Negative.   Disc levels:   C2-C3: No significant disc bulge. Left-greater-than-right facet arthropathy. No spinal canal  stenosis. Mild left neural foraminal narrowing, unchanged.   C3-C4: No significant disc bulge. Left-greater-than-right facet arthropathy and bilateral uncovertebral hypertrophy. No spinal canal stenosis. Severe right and moderate left neural foraminal narrowing, unchanged.   C4-C5: Trace anterolisthesis with disc unroofing. Right-greater-than-left facet and uncovertebral hypertrophy. No spinal canal stenosis. Severe bilateral neural foraminal narrowing, right greater than left, unchanged   C5-C6: Disc height loss and mild disc bulge. Facet and uncovertebral hypertrophy. Mild spinal canal stenosis and moderate to severe bilateral neural foraminal narrowing, unchanged.   C6-C7: Osseous fusion across the disc space. Facet and uncovertebral hypertrophy. No spinal canal stenosis. Mild bilateral neural foraminal narrowing, unchanged   C7-T1: Trace anterolisthesis and disc unroofing. Facet and uncovertebral hypertrophy. Mild spinal canal stenosis and mild-to-moderate bilateral neural foraminal narrowing, unchanged.   MRI THORACIC SPINE FINDINGS   Alignment: 3 mm anterolisthesis of T1 on T2. No other listhesis in the thoracic spine. Mild S-shaped curvature of the thoracolumbar spine.   Vertebrae: No acute fracture, suspicious osseous lesion, or evidence of discitis.   Cord:  Normal signal and morphology.   Paraspinal and other soft tissues: No acute finding.   Disc levels:   T1-T2: Trace anterolisthesis with disc unroofing. Facet arthropathy. Mild spinal canal stenosis. Mild right and moderate left neural foraminal narrowing.   No other significant spinal canal stenosis or neural foraminal narrowing in the thoracic spine.   MRI LUMBAR SPINE FINDINGS   Segmentation:  5 lumbar type vertebral bodies.   Alignment: S-shaped curvature of the thoracolumbar spine. Trace retrolisthesis of L3 on L4.   Vertebrae: No acute fracture, suspicious osseous lesion, or evidence of  discitis.   Conus medullaris and cauda equina: Conus extends to the L1-L2 level. Conus and cauda equina appear normal.   Paraspinal and other soft tissues: Increased T2 signal between the spinous processes of L3 and L4 (series 3, image 12).   Disc levels:  T12-L1: No significant disc bulge. Small left paracentral annular fissure. No spinal canal stenosis or neural foraminal narrowing.   L1-L2: Mild disc bulge with right foraminal/extreme lateral protrusion. Mild facet arthropathy. No spinal canal stenosis or neural foraminal narrowing.   L2-L3: Mild disc bulge. Mild facet arthropathy. Narrowing of the right lateral recess. No spinal canal stenosis or neural foraminal narrowing.   L3-L4: Trace retrolisthesis and mild disc bulge, eccentric to the right with right extreme lateral disc protrusion, which may contact the exiting right L3 nerve. Moderate facet arthropathy. Narrowing of the right lateral recess. No spinal canal stenosis. Mild right neural foraminal narrowing.   L4-L5: Mild disc bulge with superimposed right foraminal and extreme lateral protrusion, which may contact the exiting right L4 nerve. Mild facet arthropathy. Narrowing of the lateral recesses. No spinal canal stenosis. Mild right neural foraminal narrowing.   L5-S1: Disc height loss and mild broad-based disc bulge with superimposed left paracentral disc protrusion. Mild facet arthropathy. No spinal canal stenosis. Moderate right and mild left neural foraminal narrowing.   IMPRESSION: CERVICAL SPINE:   1. C5-C6 mild spinal canal stenosis and moderate to severe bilateral neural foraminal narrowing. 2. C7-T1 mild spinal canal stenosis and mild-to-moderate bilateral neural foraminal narrowing. 3. C3-C4 severe right and moderate left neural foraminal narrowing. 4. C4-C5 severe bilateral neural foraminal narrowing.   THORACIC SPINE:   T1-T2 mild spinal canal stenosis, with moderate left and mild  right neural foraminal narrowing.   LUMBAR SPINE:   1. L5-S1 moderate right and mild left neural foraminal narrowing. 2. L3-L4 and L4-L5 mild right neural foraminal narrowing. At L3-L4 and L4-L5, right extreme lateral disc protrusion may contact the exiting right L3 and L4 nerves, as well as narrowing of the right lateral recesses, which could affect the descending right L4 and L5 nerve roots, respectively. 3. No spinal canal stenosis. 4. Multilevel facet arthropathy, which can be a cause of back pain. 5. 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  ASSESSMENT: DEANO TOMASZEWSKI is a 82 y.o. male who presents for evaluation of progressive decline in balance, speech, and emotional disturbance. He has a relevant medical history of cervical and lumbar degenerative disc disease s/p ACDF (C6-7 and L5-S1), OA, chronic headaches, CAD, HTN, CKD, HLD, NASH, hearing loss, prostate cancer. His neurological examination is pertinent for pseudobulbar affect, increased tone and decreased rapid alternating movements of LUE, and hyperreflexia. Primitive reflexes such as facial and midline reflexes are present as well.   Extensive work up has thus far not shown clear etiology. MRI neuroaxis did not show a structural cause for symptoms. Some lab work is still pending (see above). Overall, patient's symptoms are concerning for a neurodegenerative process (?parkinsonism, CBD).   PLAN: -Blood work: Will follow up on pending labs from hospitalization -DATscan -Will consider LP with possible paraneoplastic panel -Will consider malignancy work up -PT discussed, patient deferred as he is very active -Sinemet trial 1/2 tablet TID -Nuedexta 20mg /10mg  sample for pseudobulbar affect (once daily) - 13 tablets. Patient will let me know if this works for him and wants prescription.  -Return to clinic in 1 month  The impression above as well as the plan as  outlined below were extensively discussed with the patient (in the company of wife) who voiced understanding. All questions were answered to their satisfaction.  The patient was counseled on pertinent fall precautions per the printed material provided today, and as noted under the "  Patient Instructions" section below.  When available, results of the above investigations and possible further recommendations will be communicated to the patient via telephone/MyChart. Patient to call office if not contacted after expected testing turnaround time.   Total time spent reviewing records, interview, history/exam, documentation, and coordination of care on day of encounter:  100 min   Thank you for allowing me to participate in patient's care.  If I can answer any additional questions, I would be pleased to do so.  Jacquelyne Balint, MD   CC: McGowen, Maryjean Morn, MD [address] [place] [address]  CC: Referring provider: Jeoffrey Massed, MD [address] [place],  [place] [address]

## 2022-09-03 NOTE — ED Notes (Signed)
[facility] on site to transport patient to [facility] 

## 2022-09-03 NOTE — ED Triage Notes (Signed)
Recurrent weakness , unsteady gait , was seen here last week , 3 MRIs.  He states I have no sense of balance , fell this morning . No assistance devices . N ohead or neck injury .  Alert and oriented x 4 .

## 2022-09-03 NOTE — ED Notes (Signed)
ED TO INPATIENT HANDOFF REPORT  ED Nurse Name and Phone #: Dominga Ferry, [place] Paramedic [phone]  S Name/Age/Gender William Montgomery 82 y.o. male Room/Bed: MH06/MH06  Code Status   Code Status: Prior  Home/SNF/Other Home Patient oriented to: self, place, time, and situation Is this baseline? Yes   Triage Complete: Triage complete  Chief Complaint TIA (transient ischemic attack) [G45.9]  Triage Note Recurrent weakness , unsteady gait , was seen here last week , 3 MRIs.  He states I have no sense of balance , fell this morning . No assistance devices . N ohead or neck injury .  Alert and oriented x 4 .    Allergies Allergies  Allergen Reactions   Ace Inhibitors Cough   Imdur [Isosorbide Dinitrate] Other (See Comments)    Headache    Level of Care/Admitting Diagnosis ED Disposition     ED Disposition  Admit   Condition  --   Comment  Hospital Area: [facility] [[id number]]  Level of Care: Telemetry Medical [104]  Interfacility transfer: Yes  May place patient in observation at [facility] or [facility] if equivalent level of care is available:: No  Covid Evaluation: Asymptomatic - no recent exposure (last 10 days) testing not required  Diagnosis: TIA (transient ischemic attack) [308657]  Admitting Physician: Coralie Keens [[id number]]  Attending Physician: Coralie Keens [[id number]]          B Medical/Surgery History Past Medical History:  Diagnosis Date   Arthritis    joints/  back/  neck   Chronic headaches    due to cervical spondylosis   Chronic renal insufficiency, stage 2 (mild)    borderlines II/III   Coronary artery disease 1999   cardiologist--- dr Antoine Poche---  (per cardiology note pt refused to take asa/ statin) hx PCI w/ stenting in 1999 to LAD and 12-23-2007 x2 DES to proximal LAD;  Cath 03/2017-->  non- critical 70 - 80% mid/distal circ stenosis,  chronic small D1 80% stenosis (stable) and DES to proximal and  mid LAD--   Med mgmt rec'd-->nitrates caused HAs, ACE-I caused cough.   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 Jarold Motto)   Erectile dysfunction after radical prostatectomy    History of kidney stones    History of prostate cancer 12/2014   urologist-- dr Retta Diones---   s/p Robot assisted radical prostatectomy 07/2015: lymph node involvement//   undetactable PSA since   HTN (hypertension)    ACE-I cough.  Fine on ARB.   Hx of blastomycosis 1978   s/p  right upper lobectomy   Hyperlipidemia    NASH (nonalcoholic steatohepatitis) 06/2011   normal LFTS  /  Hep B and C testing neg.  Mild fatty liver on CT 2020   Nocturia    Pulmonary nodules    small, picked up on CT ab/pelv for abd pains/flank pains-->pt quit smoking 40 yrs ago.  Low risk for lung ca so no f/u CT needed.   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   Tinnitus    Wears glasses    Wears hearing aid in both ears    Past Surgical History:  Procedure Laterality Date  ANTERIOR CERVICAL DECOMP/DISCECTOMY FUSION  1982   C6--7   APPENDECTOMY  1953   CARDIAC CATHETERIZATION  02/27/2000   @[facility]  by dr hochrein ---  single vessel w/ preserved LVSF   CORONARY ANGIOPLASTY WITH STENT PLACEMENT  12/23/2007   by Dr End--- PCI w/ DES x2 to  pLAD   CORONARY STENT PLACEMENT  1999   PTCA w/ stenting to LAd   CYSTOSCOPY WITH RETROGRADE PYELOGRAM, URETEROSCOPY AND STENT PLACEMENT Left 05/10/2018   Procedure: CYSTOSCOPY WITH LEFT RETROGRADE PYELOGRAM, LEFT URETEROSCOPY AND LEFT URETERAL STENT PLACEMENT;  Surgeon: Sebastian Ache, MD;  Location: [facility];  Service: Urology;  Laterality: Left;   HOLMIUM LASER APPLICATION Left 05/10/2018   Procedure: HOLMIUM LASER APPLICATION;  Surgeon:  Sebastian Ache, MD;  Location: [facility];  Service: Urology;  Laterality: Left;   INCISIONAL HERNIA REPAIR N/A 03/19/2019   Procedure: LAPAROSCOPIC INCISIONAL HERNIA REPAIR WITH MESH, RECURRENT UMBILICAL HERNIA REPAIR;  Surgeon: Karie Soda, MD;  Location: [facility];  Service: General;  Laterality: N/A;   INGUINAL HERNIA REPAIR Right 03/19/2019   Procedure: LAPAROSCOPIC BILATERAL FEMORAL AND INGUINAL HERNIA REPAIR WITH MESH, LYSIS OF ADHESIONS;  Surgeon: Karie Soda, MD;  Location: [facility];  Service: General;  Laterality: Right;   LEFT HEART CATH AND CORONARY ANGIOGRAPHY N/A 03/21/2017   Stents patent; mod circ dz, nothing for intervention--imdur added.  Procedure: LEFT HEART CATH AND CORONARY ANGIOGRAPHY;  Surgeon: Yvonne Kendall, MD;  Location: [facility];  Service: Cardiovascular;  Laterality: N/A;   LUMBAR SPINE SURGERY  1978   L5--S1   LUNG LOBECTOMY Right 1978   right upper lobectomy for blastomycosis   LYMPHADENECTOMY Bilateral 07/14/2015   Procedure: LYMPHADENECTOMY;  Surgeon: Heloise Purpura, MD;  Location: [facility];  Service: Urology;  Laterality: Bilateral;   ROBOT ASSISTED LAPAROSCOPIC RADICAL PROSTATECTOMY N/A 07/14/2015   One positive pelvic LN.  Procedure: XI ROBOTIC ASSISTED LAPAROSCOPIC RADICAL PROSTATECTOMY LEVEL 2;  Surgeon: Heloise Purpura, MD;  Location: [facility];  Service: Urology;  Laterality: N/A;   ROTATOR CUFF REPAIR  2007   UMBILICAL HERNIA REPAIR  07/14/2015   Procedure: HERNIA REPAIR UMBILICAL ADULT;  Surgeon: Heloise Purpura, MD;  Location: [facility];  Service: Urology;;     A IV Location/Drains/Wounds Patient Lines/Drains/Airways Status     Active Line/Drains/Airways     Name Placement date Placement time Site Days   Peripheral IV 09/03/22 20 G Left Antecubital 09/03/22  1439  Antecubital  less than 1   Sheath 03/21/17 Right Arterial;Radial 03/21/17  1013  Arterial;Radial  1992   Ureteral Drain/Stent Left ureter 5 Fr. 05/10/18  0825  Left ureter  1577    Incision - 4 Ports Abdomen Right;Mid Left;Mid;Upper Left;Mid Left;Lower 03/19/19  0945  -- 1264   Incision - 6 Ports Abdomen 1: Left;Lateral;Lower 2: Left;Lateral;Upper 3: Umbilicus;Superior Right;Lateral;Lower 5: Right;Medial;Upper 6: Right;Lateral;Upper 07/14/15  1158  -- 2608            Intake/Output Last 24 hours No intake or output data in the 24 hours ending 09/03/22 1901  Labs/Imaging Results for orders placed or performed during the hospital encounter of 09/03/22 (from the past 48 hour(s))  CBG monitoring, ED     Status: Abnormal   Collection Time: 09/03/22  2:19 PM  Result Value Ref Range   Glucose-Capillary 100 (H) 70 - 99 mg/dL    Comment: Glucose reference range applies only to samples taken after fasting for at least 8 hours.   Comment 1 Notify RN   Ethanol  Status: None   Collection Time: 09/03/22  2:41 PM  Result Value Ref Range   Alcohol, Ethyl (B) <10 <10 mg/dL    Comment: (NOTE) Lowest detectable limit for serum alcohol is 10 mg/dL.  For medical purposes only. Performed at [facility], [address] [place], [place] [address]   CBC     Status: Abnormal   Collection Time: 09/03/22  2:41 PM  Result Value Ref Range   WBC 5.7 4.0 - 10.5 K/uL   RBC 4.09 (L) 4.22 - 5.81 MIL/uL   Hemoglobin 14.3 13.0 - 17.0 g/dL   HCT 60.4 54.0 - 98.1 %   MCV 102.0 (H) 80.0 - 100.0 fL   MCH 35.0 (H) 26.0 - 34.0 pg   MCHC 34.3 30.0 - 36.0 g/dL   RDW 19.1 47.8 - 29.5 %   Platelets 120 (L) 150 - 400 K/uL   nRBC 0.0 0.0 - 0.2 %    Comment: Performed at [facility], [address] [place], [place] [address]  Differential     Status: None   Collection Time: 09/03/22  2:41 PM  Result Value Ref Range   Neutrophils Relative % 76 %   Neutro Abs 4.3 1.7 - 7.7 K/uL   Lymphocytes Relative 18 %   Lymphs Abs 1.0 0.7 - 4.0 K/uL   Monocytes Relative 5 %   Monocytes Absolute 0.3 0.1 - 1.0 K/uL   Eosinophils Relative 1 %   Eosinophils Absolute 0.1 0.0 - 0.5  K/uL   Basophils Relative 0 %   Basophils Absolute 0.0 0.0 - 0.1 K/uL   Immature Granulocytes 0 %   Abs Immature Granulocytes 0.01 0.00 - 0.07 K/uL    Comment: Performed at [facility], [address] [place], [place] [address]  Comprehensive metabolic panel     Status: Abnormal   Collection Time: 09/03/22  2:41 PM  Result Value Ref Range   Sodium 136 135 - 145 mmol/L   Potassium 3.9 3.5 - 5.1 mmol/L   Chloride 99 98 - 111 mmol/L   CO2 26 22 - 32 mmol/L   Glucose, Bld 116 (H) 70 - 99 mg/dL    Comment: Glucose reference range applies only to samples taken after fasting for at least 8 hours.   BUN 22 8 - 23 mg/dL   Creatinine, Ser 4.69 0.61 - 1.24 mg/dL   Calcium 8.9 8.9 - 62.9 mg/dL   Total Protein 7.2 6.5 - 8.1 g/dL   Albumin 4.3 3.5 - 5.0 g/dL   AST 23 15 - 41 U/L   ALT 26 0 - 44 U/L   Alkaline Phosphatase 86 38 - 126 U/L   Total Bilirubin 0.7 0.3 - 1.2 mg/dL   GFR, Estimated >52 >84 mL/min    Comment: (NOTE) Calculated using the CKD-EPI Creatinine Equation (2021)    Anion gap 11 5 - 15    Comment: Performed at [facility], [address] [place], [place] [address]  Ammonia     Status: None   Collection Time: 09/03/22  2:41 PM  Result Value Ref Range   Ammonia <10 9 - 35 umol/L    Comment: Performed at [facility], [address] [place], [place] [address]   CT Head Wo Contrast  Result Date: 09/03/2022 CLINICAL DATA:  Weakness, unsteady gait. EXAM: CT HEAD WITHOUT CONTRAST TECHNIQUE: Contiguous axial images were obtained from the base of the skull through the  vertex without intravenous contrast. RADIATION DOSE REDUCTION: This exam was performed according to the departmental dose-optimization program which includes automated exposure control, adjustment of the mA and/or kV according to patient size and/or use of iterative reconstruction technique. COMPARISON:  CT head 09/15/2021. FINDINGS: Brain: There is no acute intracranial hemorrhage,  extra-axial fluid collection, or acute infarct There is background parenchymal volume loss with prominence of the ventricular system and extra-axial CSF spaces. The ventricles are slightly increased in size compared to the head CT from 2023 but without evidence of transependymal flow of CSF to suggest acute hydrocephalus. Gray-white differentiation is preserved. Mild hypodensity in the supratentorial white matter likely reflects sequela of mild chronic small-vessel ischemic change. The pituitary and suprasellar region are normal. There is no mass lesion there is no mass effect or midline shift. Vascular: No hyperdense vessel or unexpected calcification. Skull: Normal. Negative for fracture or focal lesion. Sinuses/Orbits: The imaged paranasal sinuses are clear. The imaged globes and orbits are unremarkable. Other: None. IMPRESSION: No acute intracranial pathology. Electronically Signed   By: Lesia Hausen M.D.   On: 09/03/2022 15:33    Pending Labs Unresulted Labs (From admission, onward)     Start     Ordered   09/03/22 1431  Urine rapid drug screen (hosp performed)  Once,   STAT        09/03/22 1431   09/03/22 1431  Urinalysis, Routine w reflex microscopic -Urine, Clean Catch  Once,   URGENT       Question:  Specimen Source  Answer:  Urine, Clean Catch   09/03/22 1431            Vitals/Pain Today's Vitals   09/03/22 1412 09/03/22 1820 09/03/22 1855 09/03/22 1859  BP:    (!) 147/75  Pulse:    85  Resp:    19  Temp:  98.3 F (36.8 C)  98.5 F (36.9 C)  TempSrc:  Oral  Oral  SpO2:    99%  PainSc: 0-No pain  0-No pain     Isolation Precautions No active isolations  Medications Medications - No data to display  Mobility walks     Focused Assessments Neuro Assessment Handoff:  Swallow screen pass? Yes    NIH Stroke Scale  Dizziness Present: No Headache Present: No Interval: Initial Level of Consciousness (1a.)   : Alert, keenly responsive LOC Questions (1b. )   :  Answers both questions correctly LOC Commands (1c. )   : Performs both tasks correctly Best Gaze (2. )  : Normal Visual (3. )  : No visual loss Facial Palsy (4. )    : Normal symmetrical movements Motor Arm, Left (5a. )   : (S) Drift (Patient states that this outcome has become normal for him and was the same at his PCP office the other day.) Motor Arm, Right (5b. ) : No drift Motor Leg, Left (6a. )  : (S) Drift (Patient states that this outcome has become normal for him and was the same at his PCP office the other day.) Motor Leg, Right (6b. ) : (S) Drift (Patient states that this outcome has become normal for him and was the same at his PCP office the other day.) Limb Ataxia (7. ): Absent Sensory (8. )  : Normal, no sensory loss Best Language (9. )  : No aphasia Dysarthria (10. ): Normal Extinction/Inattention (11.)   : No Abnormality Complete NIHSS TOTAL: 3     Neuro Assessment:   Neuro Checks:  Initial (09/03/22 1838)  Has TPA been given? No If patient is a Neuro Trauma and patient is going to OR before floor call report to 4N Charge nurse: [phone] or [phone]   R Recommendations: See Admitting Provider Note  Report given to:   Additional Notes:

## 2022-09-03 NOTE — Assessment & Plan Note (Signed)
Patient has more proximal muscle weakness in his hip flexors and abdominal musculature.  Biceps and quadriceps motor function is quite good.  There is no history of any sort of muscular dystrophy in the family.  Patient's B12 level is normal.  Check a CK for evaluation of PMR although he does not have a lot of upper arm/shoulder girdle pain.

## 2022-09-03 NOTE — ED Provider Notes (Signed)
[facility] Provider Note   CSN: [id number] Arrival date & time: 09/03/22  1356     History  Chief Complaint  Patient presents with   Weakness    William Montgomery Montgomery is a 82 y.o. male.  81 year old male with a history of hypertension, William Montgomery cirrhosis, CAD status post PCI, and chronic tinnitus who presents emergency department with generalized weakness and dizziness.  Patient reports that since October has been having generalized weakness as well as dizziness.  Says that he will occasionally start to fall because he feels dizzy.  Has chronic tinnitus but denies any new hearing loss.  Primary doctor performed an MRI of the cervical, thoracic, and lumbar spine earlier in the month that did not explain his symptoms.  Also reports that he has been having word finding difficulty and occasional slurred speech and that his writing has started to change as well.  But his primary doctor about his symptoms who referred him into the emergency department for evaluation today.  Denies any new diets, gastric bypass, or heavy alcohol use.       Home Medications Prior to Admission medications   Medication Sig Start Date End Date Taking? Authorizing Provider  acetaminophen (TYLENOL) 500 MG tablet Take 500 mg by mouth every 6 (six) hours as needed.    [provider]  ALPRAZolam Prudy Feeler) 1 MG tablet Take 1 tablet (1 mg total) by mouth daily as needed. 08/23/22   McGowen, Maryjean Morn, MD  carisoprodol (SOMA) 350 MG tablet Take 1 tablet by mouth 4 times daily as needed for muscle spasms 03/26/22   McGowen, Maryjean Morn, MD  Cholecalciferol (VITAMIN D3) 5000 units CAPS Take 5,000 Units by mouth daily.    [provider]  Coenzyme Q10 (COQ10) 100 MG CAPS Take 100 mg by mouth every evening.    [provider]  Multiple Vitamin (MULTIVITAMIN) tablet Take 1 tablet by mouth daily.    [provider]  Multiple Vitamins-Minerals (ICAPS AREDS 2  PO) Take 1 capsule by mouth daily.    [provider]  niacin (VITAMIN B3) 500 MG tablet Take 500 mg by mouth at bedtime.    [provider]  nitroGLYCERIN (NITROSTAT) 0.4 MG SL tablet Place 1 tablet (0.4 mg total) under the tongue every 5 (five) minutes as needed for chest pain. 10/20/21   McGowen, Maryjean Morn, MD  propranolol (INDERAL) 10 MG tablet Take 1-2 tablets (10-20 mg total) by mouth 2 (two) times daily as needed for anxiety / panic 04/16/22   McGowen, Maryjean Morn, MD      Allergies    Ace inhibitors and Imdur [isosorbide dinitrate]    Review of Systems   Review of Systems  Physical Exam Updated Vital Signs BP (!) 158/78 (BP Location: Right Arm)   Pulse 82   Temp 98.2 F (36.8 C)   Resp 20   SpO2 98%  Physical Exam Vitals and nursing note reviewed.  Constitutional:      General: He is not in acute distress.    Appearance: He is well-developed.  HENT:     Head: Normocephalic and atraumatic.     Right Ear: Tympanic membrane, ear canal and external ear normal.     Left Ear: Tympanic membrane, ear canal and external ear normal.     Nose: Nose normal.  Eyes:     Extraocular Movements: Extraocular movements intact.     Conjunctiva/sclera: Conjunctivae normal.     Pupils: Pupils are  equal, round, and reactive to light.  Cardiovascular:     Rate and Rhythm: Normal rate and regular rhythm.     Heart sounds: Normal heart sounds.  Pulmonary:     Effort: Pulmonary effort is normal. No respiratory distress.     Breath sounds: Normal breath sounds.  Musculoskeletal:     Cervical back: Normal range of motion and neck supple.  Skin:    General: Skin is warm and dry.  Neurological:     Mental Status: He is alert. Mental status is at baseline.     Comments: MENTAL STATUS: AAOx3 CRANIAL NERVES: II: Pupils equal and reactive 4 mm BL, no RAPD, no VF deficits III, IV, VI: EOM intact, no gaze preference or deviation, no nystagmus. V: normal sensation to light touch in  V1, V2, and V3 segments bilaterally VII: no facial weakness or asymmetry, no nasolabial fold flattening VIII: normal hearing to speech and finger friction IX, X: normal palatal elevation, no uvular deviation XI: 5/5 head turn and 5/5 shoulder shrug bilaterally XII: midline tongue protrusion MOTOR: 5/5 strength in R shoulder flexion, elbow flexion and extension, and grip strength. 5/5 strength in L shoulder flexion, elbow flexion and extension, and grip strength.  5/5 strength in R hip and knee flexion, knee extension, ankle plantar and dorsiflexion. 5/5 strength in L hip and knee flexion, knee extension, ankle plantar and dorsiflexion. SENSORY: Normal sensation to light touch in all extremities COORD: Normal finger to nose and heel to shin, no tremor, no dysmetria GAIT: shuffling gait   Psychiatric:        Mood and Affect: Mood normal.        Behavior: Behavior normal.     ED Results / Procedures / Treatments   Labs (all labs ordered are listed, but only abnormal results are displayed) Labs Reviewed  CBC - Abnormal; Notable for the following components:      Result Value   RBC 4.09 (*)    MCV 102.0 (*)    MCH 35.0 (*)    Platelets 120 (*)    All other components within normal limits  COMPREHENSIVE METABOLIC PANEL - Abnormal; Notable for the following components:   Glucose, Bld 116 (*)    All other components within normal limits  CBG MONITORING, ED - Abnormal; Notable for the following components:   Glucose-Capillary 100 (*)    All other components within normal limits  ETHANOL  DIFFERENTIAL  AMMONIA  RAPID URINE DRUG SCREEN, HOSP PERFORMED  URINALYSIS, ROUTINE W REFLEX MICROSCOPIC    EKG EKG Interpretation  Date/Time:  Monday September 03 2022 14:49:30 EDT Ventricular Rate:  76 PR Interval:  171 QRS Duration: 86 QT Interval:  338 QTC Calculation: 380 R Axis:   54 Text Interpretation: Sinus rhythm Probable left atrial enlargement Confirmed by Vonita Moss [id number]  on 09/03/2022 3:52:33 PM  Radiology CT Head Wo Contrast  Result Date: 09/03/2022 CLINICAL DATA:  Weakness, unsteady gait. EXAM: CT HEAD WITHOUT CONTRAST TECHNIQUE: Contiguous axial images were obtained from the base of the skull through the vertex without intravenous contrast. RADIATION DOSE REDUCTION: This exam was performed according to the departmental dose-optimization program which includes automated exposure control, adjustment of the mA and/or kV according to patient size and/or use of iterative reconstruction technique. COMPARISON:  CT head 09/15/2021. FINDINGS: Brain: There is no acute intracranial hemorrhage, extra-axial fluid collection, or acute infarct There is background parenchymal volume loss with prominence of the ventricular system and extra-axial CSF spaces. The ventricles are slightly  increased in size compared to the head CT from 2023 but without evidence of transependymal flow of CSF to suggest acute hydrocephalus. Gray-white differentiation is preserved. Mild hypodensity in the supratentorial white matter likely reflects sequela of mild chronic small-vessel ischemic change. The pituitary and suprasellar region are normal. There is no mass lesion there is no mass effect or midline shift. Vascular: No hyperdense vessel or unexpected calcification. Skull: Normal. Negative for fracture or focal lesion. Sinuses/Orbits: The imaged paranasal sinuses are clear. The imaged globes and orbits are unremarkable. Other: None. IMPRESSION: No acute intracranial pathology. Electronically Signed   By: Lesia Hausen M.D.   On: 09/03/2022 15:33    Procedures Procedures   Medications Ordered in ED Medications - No data to display  ED Course/ Medical Decision Making/ A&P Clinical Course as of 09/03/22 1554  Mon Sep 03, 2022  1516 Care patient received from prior provider at 1500, please see their note for initial care and management.  82 year old male with weakness/dizziness since last October,  possibly some recent interval worsening, multiple falls.  Seen by PCP had a full spine MRI without clear diagnosis comes in today for further care and management given the fall today.  CT head is pending.  Pending reassessment after CT head, likely outpatient neurologic referral. On chart review, [facility] and has an appointment scheduled for 2 weeks.  Additionally he has declined neurosurgery referral in the outpatient setting. [CC]    Clinical Course User Index [CC] Glyn Ade, MD                             Medical Decision Making Amount and/or Complexity of Data Reviewed Labs: ordered. Radiology: ordered.   William Montgomery Montgomery is a 82 y.o. male with comorbidities that complicate the patient evaluation including hypertension, William Montgomery cirrhosis, CAD status post PCI, and chronic tinnitus who presents to the emergency department with generalized weakness and dizziness  Initial Ddx:  Stroke, normal pressure hydrocephalus, B12/folate deficiency, hepatic encephalopathy  MDM:  Unclear exactly what is causing the patient's symptoms at this time.  Given his ataxia concern about possible stroke.  Could also potentially have normal pressure hydrocephalus would be causing his difficulty walking and shuffling gait.  Does not have any risk factors for malnutrition that would suggest B12 or folate deficiency.  At this time is also had MRIs by his primary doctor to rule out any sort of peripheral neuropathy.  Plan:  Labs Pneumonia Urinalysis CT head without contrast  ED Summary/Re-evaluation:  Patient had lab work that was reassuring.  He is awaiting his head CT at this time.  Was signed out to Dr. Doran Durand awaiting final disposition and workup.  Admitted for dyspnea symptoms may be eligible for outpatient workup with his head CT is unremarkable.  This patient presents to the ED for concern of complaints listed in HPI, this involves an extensive number of  treatment options, and is a complaint that carries with it a high risk of complications and morbidity. Disposition including potential need for admission considered.   Dispo: Pending remainder of workup  Additional history obtained from  PCP Records reviewed Outpatient Clinic Notes The following labs were independently interpreted: Chemistry and show no acute abnormality I personally reviewed and interpreted cardiac monitoring: normal sinus rhythm  I personally reviewed and interpreted the pt's EKG: see above for interpretation  I have reviewed the patients home medications and made adjustments as  needed Social Determinants of health:  Elderly  Final Clinical Impression(s) / ED Diagnoses Final diagnoses:  Generalized weakness  Difficulty walking  Dizziness    Rx / DC Orders ED Discharge Orders     None         Rondel Baton, MD 09/03/22 1554

## 2022-09-03 NOTE — Assessment & Plan Note (Signed)
Observation telemetry bed.  Unclear the cause of his generalized weakness.  MRI of the brain has been ordered.  Patient states that he gets claustrophobic.  0.5 mg of Xanax ordered.  Etiology of his symptoms including weakness, word finding, worsening handwriting, gait imbalance is not completely clear.  Will check a CK, acetylcholine receptor binding study.  Patient does have a upcoming appoint with neurology on 09/05/2022.  May benefit from EMG testing to evaluate for ALS.

## 2022-09-03 NOTE — Telephone Encounter (Signed)
I reviewed the MRI of C-spine, T-spine, and L-spine with William Montgomery today. I really do not see anything in the results that would explain William Montgomery progressive generalized weakness and significant gait instability.  On the phone today William Montgomery told me about some symptoms that have come on since I last saw him--significant word finding difficulty and sometimes speech that is slurred.  Weakness bad enough in the arms now that William Montgomery has difficulty writing. William Montgomery feels like William Montgomery left arm is a little weaker than the right.  William Montgomery describes no numbness or paresthesias.  Bilateral leg weakness unchanged--William Montgomery cannot get up from a squatted position. Denies any visual symptoms or temporal headaches.  William Montgomery does have occipital headaches but these are unchanged and usually are just in the mornings. William Montgomery denies any tremor.  No loss of bowel or bladder control.  Fortunately, William Montgomery did move William Montgomery neurology appointment up to this Wednesday, May 1.  Given William Montgomery progression of symptoms plus the new subtle weakness in the left arm and the slurred speech I recommended that William Montgomery get urgent evaluation in the emergency department and I suspect they would do CT scan of the brain to rule out hydrocephalus, acute stroke, prominent mass, subdural hematoma, etc. William Montgomery understood and expressed agreement with the plan but at this point William Montgomery is choosing to wait until tomorrow morning to go. William Montgomery understands to call 911 if William Montgomery should progress significantly over the next few hours.  Signed:  Santiago Bumpers, MD           09/03/2022

## 2022-09-03 NOTE — H&P (Signed)
History and Physical    William Montgomery WUJ:[id number] DOB: 09/27/1940 DOA: 09/03/2022  DOS: the patient was seen and examined on 09/03/2022  PCP: Jeoffrey Massed, MD   Patient coming from: Home  I have personally briefly reviewed patient's old medical records in [facility]  CC: weakness, word finding, gait imbalance HPI: 82 year old white male prior history of coronary disease status post stent not taking aspirin or statin due to his preference, hypertension, presents to the ER today with intermittent slurred speech, gait imbalance, and truncal/proximal muscle weakness.  This has been ongoing for several weeks now.  Patient to call his family doctor told him about the intermittent arm weakness and his word finding.  ED want him to go to the ER for evaluation.  Patient thinks that sometime last year, he had laid down about 50 feet of sidewalk concrete by himself.  He began having severe headaches afterwards.  He went to see several specialists including neurosurgery.  Ultimately ended getting a occipital nerve injection by his family doctor which did relieve the headache.  His headache came back several weeks later and had another occipital nerve injection.  This time this did not work.  Since that time, he has had intermittent headaches.  He has also noticed increasing difficulty with walking.  He has a wide-based gait now.  He is shuffling his feet.  He states that he has difficulty going from sitting to standing.  He needs to brace himself or he will fall backwards after he stands.  He notices that he is stronger in the morning than in the evening.  He is usually able to walk up and down the stairs without difficulty morning but finds that he has much more difficulty with walking up the stairs in the evening.  He denies any diplopia.  No bowel or bladder incontinence.  He denies any chest pain or shortness of breath.  Due to his persistent symptoms of gait imbalance, headache,  generalized weakness, a neurology referral was made.  He is actually scheduled to see them on Sep 05, 2022.  He is also had a MRI of the cervical, thoracic and lumbar spine performed.  He has had a prior C6-C7 fusion.  He has chronic bilateral neural foraminal narrowing and mild spinal canal stenosis.  This is not significantly changed compared to his MRI in July 2023.  His B12 level was normal at 648 on August 23, 2022. TSH was normal at 2.67  CBC showed a white count of 5.7, hemoglobin of 14.3, platelets of 120.  MCV elevated at 102.  Sodium 136, potassium 3.9, bicarb 26, BUN 22, creatinine 1.0  UA was unremarkable. UDS positive for THC.  CT head without contrast demonstrated no acute intracranial abnormality.  [facility] hospitalist contacted for admission.   ED Course: CT head negative. CBC/CMP unremarkable  Review of Systems:  Review of Systems  Constitutional: Negative.   HENT: Negative.    Eyes: Negative.  Negative for blurred vision and double vision.  Respiratory: Negative.    Cardiovascular: Negative.   Gastrointestinal: Negative.   Genitourinary: Negative.   Musculoskeletal:  Positive for neck pain.  Skin: Negative.   Neurological:  Positive for dizziness and weakness.       Wording finding Having difficulty standing up.  Endo/Heme/Allergies: Negative.   Psychiatric/Behavioral: Negative.    All other systems reviewed and are negative.   Past Medical History:  Diagnosis Date   Arthritis    joints/  back/  neck  Chronic headaches    due to cervical spondylosis   Chronic renal insufficiency, stage 2 (mild)    borderlines II/III   Coronary artery disease 1999   cardiologist--- dr Antoine Poche---  (per cardiology note pt refused to take asa/ statin) hx PCI w/ stenting in 1999 to LAD and 12-23-2007 x2 DES to proximal LAD;  Cath 03/2017-->  non- critical 70 - 80% mid/distal circ stenosis,  chronic small D1 80% stenosis (stable) and DES to proximal and mid LAD--   Med mgmt  rec'd-->nitrates caused HAs, ACE-I caused cough.   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 Jarold Motto)   Erectile dysfunction after radical prostatectomy    History of kidney stones    History of prostate cancer 12/2014   urologist-- dr Retta Diones---   s/p Robot assisted radical prostatectomy 07/2015: lymph node involvement//   undetactable PSA since   HTN (hypertension)    ACE-I cough.  Fine on ARB.   Hx of blastomycosis 1978   s/p  right upper lobectomy   Hyperlipidemia    NASH (nonalcoholic steatohepatitis) 06/2011   normal LFTS  /  Hep B and C testing neg.  Mild fatty liver on CT 2020   Nocturia    Pulmonary nodules    small, picked up on CT ab/pelv for abd pains/flank pains-->pt quit smoking 40 yrs ago.  Low risk for lung ca so no f/u CT needed.   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   Tinnitus    Wears glasses    Wears hearing aid in both ears     Past Surgical History:  Procedure Laterality Date   ANTERIOR CERVICAL DECOMP/DISCECTOMY FUSION  1982   C6--7   APPENDECTOMY  1953   CARDIAC CATHETERIZATION  02/27/2000   @[facility]  by dr hochrein ---  single vessel w/ preserved LVSF   CORONARY ANGIOPLASTY WITH STENT PLACEMENT  12/23/2007   by Dr End--- PCI w/ DES x2 to  pLAD   CORONARY STENT PLACEMENT  1999   PTCA w/ stenting to LAd   CYSTOSCOPY WITH RETROGRADE PYELOGRAM, URETEROSCOPY AND STENT PLACEMENT Left 05/10/2018   Procedure: CYSTOSCOPY WITH LEFT RETROGRADE PYELOGRAM, LEFT URETEROSCOPY AND LEFT URETERAL STENT PLACEMENT;  Surgeon: Sebastian Ache, MD;  Location: [facility];  Service: Urology;  Laterality: Left;   HOLMIUM LASER APPLICATION Left 05/10/2018   Procedure: HOLMIUM LASER APPLICATION;  Surgeon: Sebastian Ache,  MD;  Location: [facility];  Service: Urology;  Laterality: Left;   INCISIONAL HERNIA REPAIR N/A 03/19/2019   Procedure: LAPAROSCOPIC INCISIONAL HERNIA REPAIR WITH MESH, RECURRENT UMBILICAL HERNIA REPAIR;  Surgeon: Karie Soda, MD;  Location: [facility];  Service: General;  Laterality: N/A;   INGUINAL HERNIA REPAIR Right 03/19/2019   Procedure: LAPAROSCOPIC BILATERAL FEMORAL AND INGUINAL HERNIA REPAIR WITH MESH, LYSIS OF ADHESIONS;  Surgeon: Karie Soda, MD;  Location: [facility];  Service: General;  Laterality: Right;   LEFT HEART CATH AND CORONARY ANGIOGRAPHY N/A 03/21/2017   Stents patent; mod circ dz, nothing for intervention--imdur added.  Procedure: LEFT HEART CATH AND CORONARY ANGIOGRAPHY;  Surgeon: Yvonne Kendall, MD;  Location: [facility];  Service: Cardiovascular;  Laterality: N/A;   LUMBAR SPINE SURGERY  1978  L5--S1   LUNG LOBECTOMY Right 1978   right upper lobectomy for blastomycosis   LYMPHADENECTOMY Bilateral 07/14/2015   Procedure: LYMPHADENECTOMY;  Surgeon: Heloise Purpura, MD;  Location: [facility];  Service: Urology;  Laterality: Bilateral;   ROBOT ASSISTED LAPAROSCOPIC RADICAL PROSTATECTOMY N/A 07/14/2015   One positive pelvic LN.  Procedure: XI ROBOTIC ASSISTED LAPAROSCOPIC RADICAL PROSTATECTOMY LEVEL 2;  Surgeon: Heloise Purpura, MD;  Location: [facility];  Service: Urology;  Laterality: N/A;   ROTATOR CUFF REPAIR  2007   UMBILICAL HERNIA REPAIR  07/14/2015   Procedure: HERNIA REPAIR UMBILICAL ADULT;  Surgeon: Heloise Purpura, MD;  Location: [facility];  Service: Urology;;     reports that he quit smoking about 46 years ago. His smoking use included cigarettes. He has a 10.00 pack-year smoking history. He quit smokeless tobacco use about 24 years ago.  His smokeless tobacco use included chew. He reports current alcohol use. He reports that he does not use drugs.  Allergies  Allergen Reactions   Ace Inhibitors Cough   Imdur [Isosorbide Dinitrate] Other (See Comments)    Headache     Family History  Problem Relation Age of Onset   Cervical cancer Mother        deceased   Cirrhosis Father    Colon cancer Neg Hx     Prior to Admission medications   Medication Sig Start Date End Date Taking? Authorizing Provider  acetaminophen (TYLENOL) 500 MG tablet Take 500 mg by mouth every 6 (six) hours as needed.    [provider]  ALPRAZolam Prudy Feeler) 1 MG tablet Take 1 tablet (1 mg total) by mouth daily as needed. 08/23/22   McGowen, Maryjean Morn, MD  carisoprodol (SOMA) 350 MG tablet Take 1 tablet by mouth 4 times daily as needed for muscle spasms 03/26/22   McGowen, Maryjean Morn, MD  Cholecalciferol (VITAMIN D3) 5000 units CAPS Take 5,000 Units by mouth daily.    [provider]  Coenzyme Q10 (COQ10) 100 MG CAPS Take 100 mg by mouth every evening.    [provider]  Multiple Vitamin (MULTIVITAMIN) tablet Take 1 tablet by mouth daily.    [provider]  Multiple Vitamins-Minerals (ICAPS AREDS 2 PO) Take 1 capsule by mouth daily.    [provider]  niacin (VITAMIN B3) 500 MG tablet Take 500 mg by mouth at bedtime.    [provider]  nitroGLYCERIN (NITROSTAT) 0.4 MG SL tablet Place 1 tablet (0.4 mg total) under the tongue every 5 (five) minutes as needed for chest pain. 10/20/21   McGowen, Maryjean Morn, MD  propranolol (INDERAL) 10 MG tablet Take 1-2 tablets (10-20 mg total) by mouth 2 (two) times daily as needed for anxiety / panic 04/16/22   Jeoffrey Massed, MD    Physical Exam: Vitals:   09/03/22 1859 09/03/22 2006 09/03/22 2015 09/03/22 2031  BP: (!) 147/75 135/78    Pulse: 85 81    Resp: 19 16    Temp: 98.5 F (36.9 C) 98.4 F (36.9 C)    TempSrc: Oral Oral    SpO2: 99% 97%    Weight:   72.8 kg   Height:    5\' 9"  (1.753 m)    Physical Exam Vitals and nursing note reviewed.  Constitutional:      General: He is not in acute distress.    Appearance: Normal appearance. He is not ill-appearing, toxic-appearing or  diaphoretic.  HENT:     Head: Normocephalic and atraumatic.  Nose: Nose normal.  Eyes:     General: No scleral icterus. Cardiovascular:     Rate and Rhythm: Normal rate and regular rhythm.     Pulses: Normal pulses.  Pulmonary:     Effort: Pulmonary effort is normal.     Breath sounds: Normal breath sounds.  Abdominal:     General: Abdomen is flat. Bowel sounds are normal. There is no distension.     Palpations: Abdomen is soft.     Tenderness: There is no abdominal tenderness.  Musculoskeletal:     Right lower leg: No edema.     Left lower leg: No edema.  Skin:    General: Skin is warm and dry.     Capillary Refill: Capillary refill takes less than 2 seconds.  Neurological:     Mental Status: He is alert and oriented to person, place, and time.     Gait: Gait abnormal and tandem walk abnormal.     Comments: Unable to stand on one leg due to balance Walks with wide shuffling gait. Has to shuffle small steps in order to turn about 180 degrees  4/5 right hip flexor str 3/5 left hip flexor str  5/5 bilateral quadriceps str 5/5 bilateral hamstring str  5/5 dorsi/plantar flexion bilateral  5/5 biceps/tricpes str bilatera  Mild cogwheel of right biceps  Unable to tandem walk      Labs on Admission: I have personally reviewed following labs and imaging studies  CBC: Recent Labs  Lab 09/03/22 1441  WBC 5.7  NEUTROABS 4.3  HGB 14.3  HCT 41.7  MCV 102.0*  PLT 120*   Basic Metabolic Panel: Recent Labs  Lab 09/03/22 1441  NA 136  K 3.9  CL 99  CO2 26  GLUCOSE 116*  BUN 22  CREATININE 1.02  CALCIUM 8.9   GFR: Estimated Creatinine Clearance: 57.8 mL/min (by C-G formula based on SCr of 1.02 mg/dL). Liver Function Tests: Recent Labs  Lab 09/03/22 1441  AST 23  ALT 26  ALKPHOS 86  BILITOT 0.7  PROT 7.2  ALBUMIN 4.3   No results for input(s): "LIPASE", "AMYLASE" in the last 168 hours. Recent Labs  Lab 09/03/22 1441  AMMONIA <10   Coagulation  Profile: No results for input(s): "INR", "PROTIME" in the last 168 hours. Cardiac Enzymes: No results for input(s): "CKTOTAL", "CKMB", "CKMBINDEX", "TROPONINI", "TROPONINIHS" in the last 168 hours. BNP (last 3 results) No results for input(s): "BNP" in the last 8760 hours. HbA1C: No results for input(s): "HGBA1C" in the last 72 hours. CBG: Recent Labs  Lab 09/03/22 1419  GLUCAP 100*   Lipid Profile: No results for input(s): "CHOL", "HDL", "LDLCALC", "TRIG", "CHOLHDL", "LDLDIRECT" in the last 72 hours. Thyroid Function Tests: No results for input(s): "TSH", "T4TOTAL", "FREET4", "T3FREE", "THYROIDAB" in the last 72 hours. Anemia Panel: No results for input(s): "VITAMINB12", "FOLATE", "FERRITIN", "TIBC", "IRON", "RETICCTPCT" in the last 72 hours. Urine analysis:    Component Value Date/Time   COLORURINE YELLOW 09/03/2022 1853   APPEARANCEUR CLEAR 09/03/2022 1853   LABSPEC 1.010 09/03/2022 1853   PHURINE 6.5 09/03/2022 1853   GLUCOSEU NEGATIVE 09/03/2022 1853   HGBUR NEGATIVE 09/03/2022 1853   BILIRUBINUR NEGATIVE 09/03/2022 1853   KETONESUR NEGATIVE 09/03/2022 1853   PROTEINUR NEGATIVE 09/03/2022 1853   NITRITE NEGATIVE 09/03/2022 1853   LEUKOCYTESUR NEGATIVE 09/03/2022 1853    Radiological Exams on Admission: I have personally reviewed images CT Head Wo Contrast  Result Date: 09/03/2022 CLINICAL DATA:  Weakness, unsteady gait. EXAM: CT  HEAD WITHOUT CONTRAST TECHNIQUE: Contiguous axial images were obtained from the base of the skull through the vertex without intravenous contrast. RADIATION DOSE REDUCTION: This exam was performed according to the departmental dose-optimization program which includes automated exposure control, adjustment of the mA and/or kV according to patient size and/or use of iterative reconstruction technique. COMPARISON:  CT head 09/15/2021. FINDINGS: Brain: There is no acute intracranial hemorrhage, extra-axial fluid collection, or acute infarct There is  background parenchymal volume loss with prominence of the ventricular system and extra-axial CSF spaces. The ventricles are slightly increased in size compared to the head CT from 2023 but without evidence of transependymal flow of CSF to suggest acute hydrocephalus. Gray-white differentiation is preserved. Mild hypodensity in the supratentorial white matter likely reflects sequela of mild chronic small-vessel ischemic change. The pituitary and suprasellar region are normal. There is no mass lesion there is no mass effect or midline shift. Vascular: No hyperdense vessel or unexpected calcification. Skull: Normal. Negative for fracture or focal lesion. Sinuses/Orbits: The imaged paranasal sinuses are clear. The imaged globes and orbits are unremarkable. Other: None. IMPRESSION: No acute intracranial pathology. Electronically Signed   By: Lesia Hausen M.D.   On: 09/03/2022 15:33    EKG: My personal interpretation of EKG shows: NSR    Assessment/Plan Principal Problem:   Generalized weakness Active Problems:   Word finding difficulty   Worsened handwriting   Proximal muscle weakness    Assessment and Plan: * Generalized weakness Observation telemetry bed.  Unclear the cause of his generalized weakness.  MRI of the brain has been ordered.  Patient states that he gets claustrophobic.  0.5 mg of Xanax ordered.  Etiology of his symptoms including weakness, word finding, worsening handwriting, gait imbalance is not completely clear.  Will check a CK, acetylcholine receptor binding study.  Patient does have a upcoming appoint with neurology on 09/05/2022.  May benefit from EMG testing to evaluate for ALS.  Proximal muscle weakness Patient has more proximal muscle weakness in his hip flexors and abdominal musculature.  Biceps and quadriceps motor function is quite good.  There is no history of any sort of muscular dystrophy in the family.  Patient's B12 level is normal.  Check a CK for evaluation of PMR  although he does not have a lot of upper arm/shoulder girdle pain.  Worsened handwriting Patient states that he has had worsening handwriting for several weeks now.  He is finds that his handwriting is overall much smaller in characterization and it is becoming harder and harder to read.  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  Word finding difficulty Patient states that he is has difficulty with some word finding.  This is not all the time.  Clearly this is causing him much emotional distress.  MRI brain to rule out stroke.   DVT prophylaxis: SQ Heparin Code Status: Full Code Family Communication: no family at bedside  Disposition Plan: return home. Keep neurology appointment on 09-05-2022  Consults called: none  Admission status: Observation, Telemetry bed   Carollee Herter, DO [facility] 09/03/2022, 9:31 PM  ]

## 2022-09-04 ENCOUNTER — Encounter: Payer: Self-pay | Admitting: Family Medicine

## 2022-09-04 ENCOUNTER — Other Ambulatory Visit (HOSPITAL_BASED_OUTPATIENT_CLINIC_OR_DEPARTMENT_OTHER): Payer: Self-pay

## 2022-09-04 DIAGNOSIS — R278 Other lack of coordination: Secondary | ICD-10-CM | POA: Diagnosis not present

## 2022-09-04 DIAGNOSIS — R4789 Other speech disturbances: Secondary | ICD-10-CM | POA: Diagnosis not present

## 2022-09-04 DIAGNOSIS — R262 Difficulty in walking, not elsewhere classified: Secondary | ICD-10-CM | POA: Diagnosis not present

## 2022-09-04 DIAGNOSIS — M6281 Muscle weakness (generalized): Secondary | ICD-10-CM

## 2022-09-04 DIAGNOSIS — R531 Weakness: Secondary | ICD-10-CM | POA: Diagnosis not present

## 2022-09-04 LAB — SEDIMENTATION RATE: Sed Rate: 15 mm/hr (ref 0–16)

## 2022-09-04 LAB — C-REACTIVE PROTEIN: CRP: 9.4 mg/dL — ABNORMAL HIGH (ref <1.0)

## 2022-09-04 LAB — HEMOGLOBIN A1C
Hgb A1c MFr Bld: 5.2 % (ref 4.8–5.6)
Mean Plasma Glucose: 102.54 mg/dL

## 2022-09-04 LAB — HIV ANTIBODY (ROUTINE TESTING W REFLEX): HIV Screen 4th Generation wRfx: NONREACTIVE

## 2022-09-04 MED ORDER — HEPARIN SODIUM (PORCINE) 5000 UNIT/ML IJ SOLN: 5000.0000 [IU] | Freq: Three times a day (TID) | INTRAMUSCULAR | Status: DC

## 2022-09-04 MED ORDER — ESCITALOPRAM OXALATE 10 MG PO TABS
10.0000 mg | ORAL_TABLET | Freq: Every day | ORAL | 0 refills | Status: DC
  Filled 2022-09-04: qty 30, 30d supply, fill #0

## 2022-09-04 NOTE — Plan of Care (Signed)

## 2022-09-04 NOTE — Evaluation (Signed)
Occupational Therapy Evaluation Patient Details Name: William Montgomery MRN: [id number] DOB: 04/08/1941 Today's Date: 09/04/2022   History of Present Illness Pt is a 82 y/o male presenting on 4/29 with gait difficulties, weakness, difficulty word finding. CT, MRI negative for acute abnormality. PMH includes: CAD s/p stents, HTN, degenerative disc disease with prior ACDF and lumbar spine sx, CKD, panic attacks, NASH, hernia repair x 2.   Clinical Impression   PTA patient independent and driving. Admitted for above and presents near baseline for ADLs and functional mobility.  Patient initially requires supervision for transfers and mobility but progressed to modified independence, ADLs completed without assist.  Spouse reports decline with fatiguing throughout the day, but still being able to manage.  Strength, coordination, sensation and cognition WFL.  Based on performance today, no further OT needs identified and OT will sign off.       Recommendations for follow up therapy are one component of a multi-disciplinary discharge planning process, led by the attending physician.  Recommendations may be updated based on patient status, additional functional criteria and insurance authorization.   Assistance Recommended at Discharge PRN  Patient can return home with the following      Functional Status Assessment     Equipment Recommendations  None recommended by OT    Recommendations for Other Services       Precautions / Restrictions Precautions Precautions: None Restrictions Weight Bearing Restrictions: No      Mobility Bed Mobility Overal bed mobility: Modified Independent                  Transfers Overall transfer level: Needs assistance   Transfers: Sit to/from Stand Sit to Stand: Supervision           General transfer comment: supervision initally for stand from EOB, fading to modified independnece.      Balance Overall balance assessment: Mild deficits  observed, not formally tested                                         ADL either performed or assessed with clinical judgement   ADL Overall ADL's : Modified independent                                             Vision   Vision Assessment?: No apparent visual deficits     Perception     Praxis      Pertinent Vitals/Pain Pain Assessment Pain Assessment: No/denies pain     Hand Dominance Right   Extremity/Trunk Assessment Upper Extremity Assessment Upper Extremity Assessment: Overall WFL for tasks assessed   Lower Extremity Assessment Lower Extremity Assessment: Defer to PT evaluation       Communication Communication Communication: No difficulties   Cognition Arousal/Alertness: Awake/alert Behavior During Therapy: WFL for tasks assessed/performed Overall Cognitive Status: Within Functional Limits for tasks assessed                                       General Comments  spouse present and supportive    Exercises     Shoulder Instructions      Home Living Family/patient expects to be discharged to:: Private residence Living Arrangements: Spouse/significant  other Available Help at Discharge: Family;Available PRN/intermittently Type of Home: House Home Access: Stairs to enter [employer] of Steps: 4 Entrance Stairs-Rails: Right (up) Home Layout: Two level;Bed/bath upstairs;Able to live on main level with bedroom/bathroom (office on 2nd floor) Alternate Level Stairs-Number of Steps: '   Bathroom Shower/Tub: Walk-in shower;Tub/shower unit;Tub only (pt likes to use shower on 2nd floor)   Bathroom Toilet: Standard     Home Equipment: Grab bars - toilet;Grab bars - tub/shower   Additional Comments: spouse working during the day, pt has Charity fundraiser in baseline.  Was going to gym daily (prior to dec), but hasnt been able to recently.  Spouse reports pts abilities worsen with fatigue but  still able to manage      Prior Functioning/Environment Prior Level of Function : Independent/Modified Independent;Driving;History of Falls (last six months)               ADLs Comments: indepedent ADLs, iADLs        OT Problem List:        OT Treatment/Interventions:      OT Goals(Current goals can be found in the care plan section) Acute Rehab OT Goals Patient Stated Goal: figure out what is going on OT Goal Formulation: With patient  OT Frequency:      Co-evaluation              AM-PAC OT "6 Clicks" Daily Activity     Outcome Measure Help from another person eating meals?: None Help from another person taking care of personal grooming?: None Help from another person toileting, which includes using toliet, bedpan, or urinal?: None Help from another person bathing (including washing, rinsing, drying)?: None Help from another person to put on and taking off regular upper body clothing?: None Help from another person to put on and taking off regular lower body clothing?: None 6 Click Score: 24   End of Session Equipment Utilized During Treatment: Gait belt Nurse Communication: Mobility status  Activity Tolerance: Patient tolerated treatment well Patient left: in chair;with call bell/phone within reach;with chair alarm set;with family/visitor present  OT Visit Diagnosis: Muscle weakness (generalized) (M62.81)                Time: 4540-9811 OT Time Calculation (min): 10 min Charges:  OT General Charges $OT Visit: 1 Visit OT Evaluation $OT Eval Low Complexity: 1 Low  Barry Brunner, OT Acute Rehabilitation Services Office [phone]   Chancy Milroy 09/04/2022, 9:01 AM

## 2022-09-04 NOTE — Consult Note (Signed)
Neurology Consultation Reason for Consult: Gait impairment Requesting Physician: Carollee Herter   CC: Gait impairment   History is obtained from: Patient and chart review   HPI: William Montgomery is a 82 y.o. 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  He has been having headaches since laying 50 feet of sidewalk moving 80 pound bags of concrete in early 2023.  He had multiple presentations for this headache, including an ED visit 09/15/2021, finally experiencing a great deal of relief after occipital nerve injection by his primary care physician 01/11/2022.  February 07, 2022 began to describe some vague off-balance sensation and he was starting to have some return of the headache.  He subsequently had another injection for headache on 02/28/2022 but this was not effective.    More recently this month he has began to have significant difficulty with walking.  Primary care physician initially evaluated him with MRI cervical spine, thoracic spine and lumbar spine all of which were negative for acute abnormality.  Patient began to then report some slurred speech and worsening writing for which he was referred to the ED for evaluation of possible stroke.  He did move his neurology evaluation up from 5/16 to 5/1 but was admitted for more urgent evaluation.  Otherwise he denies any vision symptoms, speech difficulty, cognitive impairment, sleep difficulty, notes normal sense of smell, no dream enactment behavior (still shares a bed with his spouse  without any issues), denies any chronic constipation, notes that he used to take amitriptyline which was very effective for low back pain but he quit due to concerns that it was addictive and that he could not sleep without taking it, notes that he has started taking CBD 100 mg nightly for sleep and pain, however he also uses Soma as needed (has taken half a tablet 3 times in the last week).  He denies fevers, chills, rashes, weight changes, notes some pain with peeing/bladder discomfort that he has attributed to his for prior hernia repairs but has been noticing more in the past 1 month without incontinence.  Denies any change in sensation  Premorbid modified rankin scale:      0 - No symptoms.  ROS: All other review of systems was negative except as noted in the HPI.  Past Medical History:  Diagnosis Date   Arthritis    joints/  back/  neck   Chronic headaches    due to cervical spondylosis   Chronic renal insufficiency, stage 2 (mild)    borderlines II/III   Coronary artery disease 1999   cardiologist--- dr Antoine Poche---  (per cardiology note pt refused to take asa/ statin) hx PCI w/ stenting in 1999 to LAD and 12-23-2007 x2 DES to proximal LAD;  Cath 03/2017-->  non- critical 70 - 80% mid/distal circ stenosis,  chronic small D1 80% stenosis (stable) and DES to proximal and mid LAD--   Med mgmt rec'd-->nitrates caused HAs, ACE-I caused cough.   DDD (degenerative disc disease)    lumbar and cervical neck   Diverticulosis of sigmoid colon 07/25/2011  Severe (endoscopy by Dr. Jarold Motto)   Erectile dysfunction after radical prostatectomy    History of kidney stones    History of prostate cancer 12/2014   urologist-- dr Retta Diones---   s/p Robot assisted radical prostatectomy 07/2015: lymph node involvement//   undetactable PSA since   HTN (hypertension)    ACE-I cough.  Fine on ARB.   Hx of blastomycosis 1978   s/p  right upper lobectomy   Hyperlipidemia    NASH (nonalcoholic  steatohepatitis) 06/2011   normal LFTS  /  Hep B and C testing neg.  Mild fatty liver on CT 2020   Nocturia    Pulmonary nodules    small, picked up on CT ab/pelv for abd pains/flank pains-->pt quit smoking 40 yrs ago.  Low risk for lung ca so no f/u CT needed.   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   Tinnitus    Wears glasses    Wears hearing aid in both ears    Past Surgical History:  Procedure Laterality Date   ANTERIOR CERVICAL DECOMP/DISCECTOMY FUSION  1982   C6--7   APPENDECTOMY  1953   CARDIAC CATHETERIZATION  02/27/2000   @[facility]  by dr hochrein ---  single vessel w/ preserved LVSF   CORONARY ANGIOPLASTY WITH STENT PLACEMENT  12/23/2007   by Dr End--- PCI w/ DES x2 to  pLAD   CORONARY STENT PLACEMENT  1999   PTCA w/ stenting to LAd   CYSTOSCOPY WITH RETROGRADE PYELOGRAM, URETEROSCOPY AND STENT PLACEMENT Left 05/10/2018   Procedure: CYSTOSCOPY WITH LEFT RETROGRADE PYELOGRAM, LEFT URETEROSCOPY AND LEFT URETERAL STENT PLACEMENT;  Surgeon: Sebastian Ache, MD;  Location: [facility];  Service: Urology;  Laterality: Left;   HOLMIUM LASER APPLICATION Left 05/10/2018   Procedure: HOLMIUM LASER APPLICATION;  Surgeon: Sebastian Ache, MD;  Location: [facility];  Service: Urology;  Laterality: Left;   INCISIONAL HERNIA REPAIR N/A 03/19/2019   Procedure: LAPAROSCOPIC INCISIONAL HERNIA REPAIR WITH MESH, RECURRENT UMBILICAL HERNIA REPAIR;  Surgeon: Karie Soda, MD;  Location: [facility];  Service: General;  Laterality: N/A;   INGUINAL HERNIA REPAIR Right 03/19/2019   Procedure: LAPAROSCOPIC BILATERAL FEMORAL AND INGUINAL HERNIA REPAIR WITH MESH, LYSIS OF ADHESIONS;  Surgeon: Karie Soda, MD;  Location: [facility];  Service: General;  Laterality: Right;   LEFT HEART CATH AND CORONARY ANGIOGRAPHY N/A 03/21/2017   Stents patent; mod circ  dz, nothing for intervention--imdur added.  Procedure: LEFT HEART CATH AND CORONARY ANGIOGRAPHY;  Surgeon: Yvonne Kendall, MD;  Location: [facility];  Service: Cardiovascular;  Laterality: N/A;   LUMBAR SPINE SURGERY  1978   L5--S1   LUNG LOBECTOMY Right 1978   right upper lobectomy for blastomycosis   LYMPHADENECTOMY Bilateral 07/14/2015   Procedure: LYMPHADENECTOMY;  Surgeon: Heloise Purpura, MD;  Location: [facility];  Service: Urology;  Laterality: Bilateral;   ROBOT ASSISTED LAPAROSCOPIC RADICAL PROSTATECTOMY N/A 07/14/2015   One positive pelvic LN.  Procedure: XI ROBOTIC ASSISTED LAPAROSCOPIC RADICAL PROSTATECTOMY LEVEL 2;  Surgeon: Heloise Purpura, MD;  Location: [facility];  Service: Urology;  Laterality: N/A;   ROTATOR CUFF REPAIR  2007   UMBILICAL HERNIA REPAIR  07/14/2015   Procedure: HERNIA REPAIR UMBILICAL ADULT;  Surgeon: Heloise Purpura, MD;  Location: [facility];  Service: Urology;;     Family History  Problem Relation Age of Onset   Cervical cancer Mother        deceased   Cirrhosis Father    Colon cancer Neg Hx     Social History:  reports that he quit smoking about 46 years ago. His smoking use included cigarettes. He has a 10.00 pack-year smoking history. He quit smokeless tobacco use about 24 years ago.  His smokeless tobacco use included chew. He reports current alcohol use. He reports that he does not use drugs.   Exam: Current vital signs: BP 133/77 (BP Location: Right Arm)   Pulse 80   Temp 98.5 F (36.9 C) (Oral)   Resp 16   Ht 5\' 9"  (1.753 m)   Wt 72.8 kg   SpO2 96%   BMI 23.70 kg/m  Vital signs in last 24 hours: Temp:  [98.2 F (36.8 C)-98.5 F (36.9 C)] 98.5 F (36.9 C) (04/30 0400) Pulse Rate:  [80-85] 80 (04/30 0400) Resp:  [16-20] 16 (04/30 0400) BP: (133-158)/(74-78) 133/77 (04/30 0400) SpO2:  [96 %-99 %] 96 % (04/30 0400) Weight:  [72.8 kg] 72.8 kg (04/29 2015)   Physical Exam  Constitutional: Appears well-developed and well-nourished.   Psych: Affect flat at times Eyes: No scleral injection HENT: No oropharyngeal obstruction.  MSK: Chronic left shoulder depression from prior nerve damage during lung lobectomy Cardiovascular: Perfusing extremities well Respiratory: Effort normal, non-labored breathing GI: Soft.  No distension.  Skin: Warm dry and intact visible skin  Neuro: Mental Status: Patient is awake, alert, oriented to person, place, month, year, and situation. Patient is able to give a clear and coherent history. No signs of aphasia or neglect.  Perhaps a very mild motor apraxia with difficulty copying only 1 of 5 complex finger positions Please see separate note with image of patient's attempt to copy spiral.  He initiates the spiral well but then cannot progressively enlarge the diameter, there is no significant tremor.  He then signs his name and it is fairly illegible Cranial Nerves: II: Visual Fields are full. Pupils are equal, round, and reactive to light.   III,IV, VI: EOMI without ptosis or diploplia.  Smooth pursuits, no restricted upgaze V: Facial sensation is symmetric to temperature VII: Facial movement is symmetric.  VIII: hearing is intact to voice with hearing aids in place X: Uvula elevates symmetrically XI: Shoulder shrug is symmetric. XII: tongue is midline without atrophy or fasciculations.  Motor: Mildly spastic tone, no clear cogwheeling, no fasciculations observed on evaluation of the arms/legs (trunk not examined). Bulk is normal. 5/5 strength was present in all four extremities other than mild hip flexion weakness 4 - on the right 4+ on the left.  Arrhythmic rapid finger tapping bilaterally but not clearly decrementing.  No decrementing on foot tapping Sensory: Sensation is symmetric to light touch and temperature in the arms and legs.  There is a mild length dependent loss of temperature sensation in the bilateral upper extremities Deep Tendon Reflexes: 2+ in the brachioradialis,  triceps, though slightly brisker on the left than the right, briskly 2+ and symmetric patellae; Hoffmann's negative bilaterally, crossed adductor on the left but negative on the right.  Cerebellar: FNF and HKS are intact bilaterally.  At 1 point patient flopped down on the bed, without striking his head on the bed rail, but no clear truncal ataxia while conversing with examiner Gait:  Takes several attempts to stand. Gait initiation appears impaired, with reduced shuffling after taking a few steps, turning is significantly impaired.  Negative  Romberg.  Takes 1-2 steps back on retropulsion testing but does not lose his balance dramatically   I have reviewed labs in epic and the results pertinent to this consultation are:  Basic Metabolic Panel: Recent Labs  Lab 09/03/22 1441  NA 136  K 3.9  CL 99  CO2 26  GLUCOSE 116*  BUN 22  CREATININE 1.02  CALCIUM 8.9   Albumin 4.3, total protein 7.2, gamma gap normal at 2.9  CBC: Recent Labs  Lab 09/03/22 1441  WBC 5.7  NEUTROABS 4.3  HGB 14.3  HCT 41.7  MCV 102.0*  PLT 120*   CK within normal limits at 83  B12 within normal limits at 648  UA negative, UDS positive for THC  TSH 3.373  Lab Results  Component Value Date   TSH 3.373 09/03/2022     Coagulation Studies: No results for input(s): "LABPROT", "INR" in the last 72 hours.    I have reviewed the images obtained:  MRI brain 09/03/2022 personally reviewed, agree with radiology:   1. No acute intracranial abnormality. 2. 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  MRI Pan spine 08/25/2022  personally reviewed, agree with radiology:   CERVICAL SPINE:  1. C5-C6 mild spinal canal stenosis and moderate to severe bilateral neural foraminal narrowing. 2. C7-T1 mild spinal canal stenosis and mild-to-moderate bilateral neural foraminal narrowing. 3. C3-C4 severe right and moderate left neural foraminal narrowing. 4. C4-C5 severe bilateral neural foraminal  narrowing.   THORACIC SPINE: T1-T2 mild spinal canal stenosis, with moderate left and mild right neural foraminal narrowing.   LUMBAR SPINE:  1. L5-S1 moderate right and mild left neural foraminal narrowing. 2. L3-L4 and L4-L5 mild right neural foraminal narrowing. At L3-L4 and L4-L5, right extreme lateral disc protrusion may contact the exiting right L3 and L4 nerves, as well as narrowing of the right lateral recesses, which could affect the descending right L4 and L5 nerve roots, respectively. 3. No spinal canal stenosis. 4. Multilevel facet arthropathy, which can be a cause of back pain. 5. 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    Impression: Progressive gait dysfunction  in a medically complex patient as detailed above.  Fortunately imaging has revealed no significant acute structural abnormality.  Fairly brisk reflexes in an older patient with some neuropathy on examination are essentially hyperreflexic and therefore evaluation for metabolic causes of myelopathy is reasonable in tandem with evaluation of causes for neuropathy.  His gait findings of broad-based gait with hesitations during turning and reported more than elicited postural disturbances are concerning for potential neurodegenerative condition.  Rarely neurodegenerative conditions such as corticobasal degeneration can present initially with gait impairment, but typically would be associated with a constellation of neurological findings including memory issues, dystonia etc. (see case report below).  There would be no specific inpatient treatment for this condition  While the patient does describe a fairly rapid decline, overall his examination is reassuring and establishing with outpatient neurologist for close follow-up is appropriate.  EMG/nerve conduction study or muscle biopsy could be considered from an outpatient basis for further workup although at this  time his normal CK is reassuring.    J Neurosci [employer] Pract. 2020 Oct; 11(4): 161-096. 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

## 2022-09-04 NOTE — Discharge Summary (Signed)
Physician Discharge Summary  William Montgomery ZOX:[id number] DOB: 1940-08-29 DOA: 09/03/2022  PCP: Jeoffrey Massed, MD  Admit date: 09/03/2022 Discharge date: 09/04/2022 Admitted From: Home Disposition: Home Recommendations for Outpatient Follow-up:  Follow up with PCP in in 1 to 2 weeks Patient has upcoming appointment with his neurologist tomorrow (5/1). Check CMP and CBC at follow-up. Please follow up on the following pending results: ACH receptor, aldolase, folate, zinc, copper, vitamin A, ANA, MM panel  Home Health: Not indicated Equipment/Devices: Not indicated  Discharge Condition: Stable CODE STATUS: Full code  Follow-up Information     McGowen, Maryjean Morn, MD. Schedule an appointment as soon as possible for a visit in 1 week(s).   Specialty: Family Medicine Contact information: [address] [place] [address] [phone]                 Hospital course 82 year old M with PMH of CAD/stent not on aspirin and statin by choice, HTN, remote C6-C7 fusion and chronic pain presenting with intermittent slurred speech, gait imbalance, trunk and proximal muscle weakness, worsening handwriting, headache, and shuffling gait and admitted for CVA workup, generalized weakness and abnormal gait.  Patient had MRI cervical, thoracic and lumbar spine on 4/20 that did not show significant finding to ex when patient's symptoms other than mild to moderate spinal canal stenosis.  His PSA and TSH were normal.  In ED, stable vitals.  Afebrile.  Basic labs including CMP and CBC without significant finding other than mild macrocytosis and mild thrombocytopenia.  UA negative.  UDS positive for THC.  CT head and MRI brain without acute significant finding.  Neurology consulted and ordered lab labs including CRP, ESR, ACH receptor, aldolase, folate, zinc, copper, vitamin A, ANA and MM panel, and recommended outpatient follow-up with his neurologist as previously planned on 5/1.  CRP was  elevated to 9.4.  ESR was only 15.  A1c 5.2.  Patient was evaluated and cleared by therapy.  Advised to cut down on Soma and benzodiazepine or discuss with his prescriber.  See individual problem list below for more.   Problems addressed during this hospitalization Principal Problem:   Generalized weakness Active Problems:   Word finding difficulty   Worsened handwriting   Proximal muscle weakness              Time spent 35 minutes  Vital signs Vitals:   09/03/22 2218 09/03/22 2343 09/04/22 0400 09/04/22 0500  BP:  133/74 133/77   Pulse:  80 80   Temp:  98.4 F (36.9 C) 98.5 F (36.9 C)   Resp:  16 16   Height:      Weight:    73 kg  SpO2: 97% 97% 96%   TempSrc:  Oral Oral   BMI (Calculated):    23.76     Discharge exam  GENERAL: No apparent distress.  Nontoxic. HEENT: MMM.  Vision and hearing grossly intact.  NECK: Supple.  No apparent JVD.  RESP:  No IWOB.  Fair aeration bilaterally. CVS:  RRR. Heart sounds normal.  ABD/GI/GU: BS+. Abd soft, NTND.  MSK/EXT:  Moves extremities. No apparent deformity. No edema.  SKIN: no apparent skin lesion or wound NEURO: Awake and alert. Oriented appropriately.  No apparent focal neuro deficit. PSYCH: Calm. Normal affect.   Discharge Instructions Discharge Instructions     Diet general   Complete by: As directed    Discharge instructions   Complete by: As directed    It has  been a pleasure taking care of you!  You were hospitalized due to slurred speech, gait abnormality and generalized weakness.  MRI of your brain did not show stroke.  Neurology ordered some blood work that might take days to result.  Someone will get in touch with you if the results are abnormal.  Follow-up with your neurologist tomorrow.  Follow-up with your primary care doctor in 1 to 2 weeks or sooner if needed.  Note that medications such as carisoprodol and alprazolam can potentially contribute to the symptoms and affecting mentation.  We  strongly recommend cutting down on this medication slowly.  You may talk to your prescriber or primary care doctor about this medication.    Take care,   Increase activity slowly   Complete by: As directed       Allergies as of 09/04/2022       Reactions   Ace Inhibitors Cough   Imdur [isosorbide Dinitrate] Other (See Comments)   Headache        Medication List     STOP taking these medications    multivitamin tablet       TAKE these medications    acetaminophen 500 MG tablet Commonly known as: TYLENOL Take 500 mg by mouth every 6 (six) hours as needed.   ALPRAZolam 1 MG tablet Commonly known as: XANAX Take 1 tablet (1 mg total) by mouth daily as needed.   carisoprodol 350 MG tablet Commonly known as: SOMA Take 1 tablet by mouth 4 times daily as needed for muscle spasms   CoQ10 100 MG Caps Take 100 mg by mouth every evening.   ICAPS AREDS 2 PO Take 1 capsule by mouth daily.   niacin 500 MG tablet Commonly known as: VITAMIN B3 Take 500 mg by mouth at bedtime.   nitroGLYCERIN 0.4 MG SL tablet Commonly known as: NITROSTAT Place 1 tablet (0.4 mg total) under the tongue every 5 (five) minutes as needed for chest pain.   propranolol 10 MG tablet Commonly known as: INDERAL Take 1-2 tablets (10-20 mg total) by mouth 2 (two) times daily as needed for anxiety / panic   Vitamin D3 125 MCG (5000 UT) Caps Take 5,000 Units by mouth daily.        Consultations: Neurology  Procedures/Studies:   MR BRAIN WO CONTRAST  Result Date: 09/03/2022 CLINICAL DATA:  Acute neurologic deficit EXAM: MRI HEAD WITHOUT CONTRAST TECHNIQUE: Multiplanar, multiecho pulse sequences of the brain and surrounding structures were obtained without intravenous contrast. COMPARISON:  None Available. FINDINGS: Brain: No acute infarct, mass effect or extra-axial collection. No acute or chronic hemorrhage. There is multifocal hyperintense T2-weighted signal within the white matter.  Generalized volume loss. The midline structures are normal. Vascular: Major flow voids are preserved. Skull and upper cervical spine: Normal calvarium and skull base. Visualized upper cervical spine and soft tissues are normal. Sinuses/Orbits:No paranasal sinus fluid levels or advanced mucosal thickening. No mastoid or middle ear effusion. Normal orbits. IMPRESSION: 1. No acute intracranial abnormality. 2. 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 Electronically Signed   By: Deatra Robinson M.D.   On: 09/03/2022 22:57   CT Head Wo Contrast  Result Date: 09/03/2022 CLINICAL DATA:  Weakness, unsteady gait. EXAM: CT HEAD WITHOUT CONTRAST TECHNIQUE: Contiguous axial images were obtained from the base of the skull through the vertex without intravenous contrast. RADIATION DOSE REDUCTION: This exam was performed according to the departmental dose-optimization program which includes automated exposure control, adjustment of the mA and/or  kV according to patient size and/or use of iterative reconstruction technique. COMPARISON:  CT head 09/15/2021. FINDINGS: Brain: There is no acute intracranial hemorrhage, extra-axial fluid collection, or acute infarct There is background parenchymal volume loss with prominence of the ventricular system and extra-axial CSF spaces. The ventricles are slightly increased in size compared to the head CT from 2023 but without evidence of transependymal flow of CSF to suggest acute hydrocephalus. Gray-white differentiation is preserved. Mild hypodensity in the supratentorial white matter likely reflects sequela of mild chronic small-vessel ischemic change. The pituitary and suprasellar region are normal. There is no mass lesion there is no mass effect or midline shift. Vascular: No hyperdense vessel or unexpected calcification. Skull: Normal. Negative for fracture or focal lesion. Sinuses/Orbits: The imaged paranasal sinuses are clear. The imaged globes and orbits are  unremarkable. Other: None. IMPRESSION: No acute intracranial pathology. Electronically Signed   By: Lesia Hausen M.D.   On: 09/03/2022 15:33   MR Cervical Spine Wo Contrast  Result Date: 08/29/2022 CLINICAL DATA:  Balance problems, headaches, neck and back pain EXAM: MRI CERVICAL, THORACIC AND LUMBAR SPINE WITHOUT CONTRAST TECHNIQUE: Multiplanar and multiecho pulse sequences of the cervical spine, to include the craniocervical junction and cervicothoracic junction, and thoracic and lumbar spine, were obtained without intravenous contrast. COMPARISON:  11/16/2021 MRI cervical spine, no prior thoracic or lumbar MRI available FINDINGS: MRI CERVICAL SPINE FINDINGS Alignment: Straightening of the normal cervical lordosis. Trace anterolisthesis of C4 on C5 and C7 on T1. Vertebrae: No acute fracture, suspicious osseous lesion, or evidence of discitis. Redemonstrated fusion of C6 and C7, favored to be the sequela of prior surgery. Unchanged vertebral body height loss C5. Cord: Normal signal and morphology. Posterior Fossa, vertebral arteries, paraspinal tissues: Negative. Disc levels: C2-C3: No significant disc bulge. Left-greater-than-right facet arthropathy. No spinal canal stenosis. Mild left neural foraminal narrowing, unchanged. C3-C4: No significant disc bulge. Left-greater-than-right facet arthropathy and bilateral uncovertebral hypertrophy. No spinal canal stenosis. Severe right and moderate left neural foraminal narrowing, unchanged. C4-C5: Trace anterolisthesis with disc unroofing. Right-greater-than-left facet and uncovertebral hypertrophy. No spinal canal stenosis. Severe bilateral neural foraminal narrowing, right greater than left, unchanged C5-C6: Disc height loss and mild disc bulge. Facet and uncovertebral hypertrophy. Mild spinal canal stenosis and moderate to severe bilateral neural foraminal narrowing, unchanged. C6-C7: Osseous fusion across the disc space. Facet and uncovertebral hypertrophy. No  spinal canal stenosis. Mild bilateral neural foraminal narrowing, unchanged C7-T1: Trace anterolisthesis and disc unroofing. Facet and uncovertebral hypertrophy. Mild spinal canal stenosis and mild-to-moderate bilateral neural foraminal narrowing, unchanged. MRI THORACIC SPINE FINDINGS Alignment: 3 mm anterolisthesis of T1 on T2. No other listhesis in the thoracic spine. Mild S-shaped curvature of the thoracolumbar spine. Vertebrae: No acute fracture, suspicious osseous lesion, or evidence of discitis. Cord:  Normal signal and morphology. Paraspinal and other soft tissues: No acute finding. Disc levels: T1-T2: Trace anterolisthesis with disc unroofing. Facet arthropathy. Mild spinal canal stenosis. Mild right and moderate left neural foraminal narrowing. No other significant spinal canal stenosis or neural foraminal narrowing in the thoracic spine. MRI LUMBAR SPINE FINDINGS Segmentation:  5 lumbar type vertebral bodies. Alignment: S-shaped curvature of the thoracolumbar spine. Trace retrolisthesis of L3 on L4. Vertebrae: No acute fracture, suspicious osseous lesion, or evidence of discitis. Conus medullaris and cauda equina: Conus extends to the L1-L2 level. Conus and cauda equina appear normal. Paraspinal and other soft tissues: Increased T2 signal between the spinous processes of L3 and L4 (series 3, image 12). Disc levels: T12-L1: No  significant disc bulge. Small left paracentral annular fissure. No spinal canal stenosis or neural foraminal narrowing. L1-L2: Mild disc bulge with right foraminal/extreme lateral protrusion. Mild facet arthropathy. No spinal canal stenosis or neural foraminal narrowing. L2-L3: Mild disc bulge. Mild facet arthropathy. Narrowing of the right lateral recess. No spinal canal stenosis or neural foraminal narrowing. L3-L4: Trace retrolisthesis and mild disc bulge, eccentric to the right with right extreme lateral disc protrusion, which may contact the exiting right L3 nerve. Moderate  facet arthropathy. Narrowing of the right lateral recess. No spinal canal stenosis. Mild right neural foraminal narrowing. L4-L5: Mild disc bulge with superimposed right foraminal and extreme lateral protrusion, which may contact the exiting right L4 nerve. Mild facet arthropathy. Narrowing of the lateral recesses. No spinal canal stenosis. Mild right neural foraminal narrowing. L5-S1: Disc height loss and mild broad-based disc bulge with superimposed left paracentral disc protrusion. Mild facet arthropathy. No spinal canal stenosis. Moderate right and mild left neural foraminal narrowing. IMPRESSION: CERVICAL SPINE: 1. C5-C6 mild spinal canal stenosis and moderate to severe bilateral neural foraminal narrowing. 2. C7-T1 mild spinal canal stenosis and mild-to-moderate bilateral neural foraminal narrowing. 3. C3-C4 severe right and moderate left neural foraminal narrowing. 4. C4-C5 severe bilateral neural foraminal narrowing. THORACIC SPINE: T1-T2 mild spinal canal stenosis, with moderate left and mild right neural foraminal narrowing. LUMBAR SPINE: 1. L5-S1 moderate right and mild left neural foraminal narrowing. 2. L3-L4 and L4-L5 mild right neural foraminal narrowing. At L3-L4 and L4-L5, right extreme lateral disc protrusion may contact the exiting right L3 and L4 nerves, as well as narrowing of the right lateral recesses, which could affect the descending right L4 and L5 nerve roots, respectively. 3. No spinal canal stenosis. 4. Multilevel facet arthropathy, which can be a cause of back pain. 5. 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 Electronically Signed   By: Wiliam Ke M.D.   On: 08/29/2022 17:45   MR Lumbar Spine Wo Contrast  Result Date: 08/29/2022 CLINICAL DATA:  Balance problems, headaches, neck and back pain EXAM: MRI CERVICAL, THORACIC AND LUMBAR SPINE WITHOUT CONTRAST TECHNIQUE: Multiplanar and multiecho pulse sequences of the  cervical spine, to include the craniocervical junction and cervicothoracic junction, and thoracic and lumbar spine, were obtained without intravenous contrast. COMPARISON:  11/16/2021 MRI cervical spine, no prior thoracic or lumbar MRI available FINDINGS: MRI CERVICAL SPINE FINDINGS Alignment: Straightening of the normal cervical lordosis. Trace anterolisthesis of C4 on C5 and C7 on T1. Vertebrae: No acute fracture, suspicious osseous lesion, or evidence of discitis. Redemonstrated fusion of C6 and C7, favored to be the sequela of prior surgery. Unchanged vertebral body height loss C5. Cord: Normal signal and morphology. Posterior Fossa, vertebral arteries, paraspinal tissues: Negative. Disc levels: C2-C3: No significant disc bulge. Left-greater-than-right facet arthropathy. No spinal canal stenosis. Mild left neural foraminal narrowing, unchanged. C3-C4: No significant disc bulge. Left-greater-than-right facet arthropathy and bilateral uncovertebral hypertrophy. No spinal canal stenosis. Severe right and moderate left neural foraminal narrowing, unchanged. C4-C5: Trace anterolisthesis with disc unroofing. Right-greater-than-left facet and uncovertebral hypertrophy. No spinal canal stenosis. Severe bilateral neural foraminal narrowing, right greater than left, unchanged C5-C6: Disc height loss and mild disc bulge. Facet and uncovertebral hypertrophy. Mild spinal canal stenosis and moderate to severe bilateral neural foraminal narrowing, unchanged. C6-C7: Osseous fusion across the disc space. Facet and uncovertebral hypertrophy. No spinal canal stenosis. Mild bilateral neural foraminal narrowing, unchanged C7-T1: Trace anterolisthesis and disc unroofing. Facet  and uncovertebral hypertrophy. Mild spinal canal stenosis and mild-to-moderate bilateral neural foraminal narrowing, unchanged. MRI THORACIC SPINE FINDINGS Alignment: 3 mm anterolisthesis of T1 on T2. No other listhesis in the thoracic spine. Mild S-shaped  curvature of the thoracolumbar spine. Vertebrae: No acute fracture, suspicious osseous lesion, or evidence of discitis. Cord:  Normal signal and morphology. Paraspinal and other soft tissues: No acute finding. Disc levels: T1-T2: Trace anterolisthesis with disc unroofing. Facet arthropathy. Mild spinal canal stenosis. Mild right and moderate left neural foraminal narrowing. No other significant spinal canal stenosis or neural foraminal narrowing in the thoracic spine. MRI LUMBAR SPINE FINDINGS Segmentation:  5 lumbar type vertebral bodies. Alignment: S-shaped curvature of the thoracolumbar spine. Trace retrolisthesis of L3 on L4. Vertebrae: No acute fracture, suspicious osseous lesion, or evidence of discitis. Conus medullaris and cauda equina: Conus extends to the L1-L2 level. Conus and cauda equina appear normal. Paraspinal and other soft tissues: Increased T2 signal between the spinous processes of L3 and L4 (series 3, image 12). Disc levels: T12-L1: No significant disc bulge. Small left paracentral annular fissure. No spinal canal stenosis or neural foraminal narrowing. L1-L2: Mild disc bulge with right foraminal/extreme lateral protrusion. Mild facet arthropathy. No spinal canal stenosis or neural foraminal narrowing. L2-L3: Mild disc bulge. Mild facet arthropathy. Narrowing of the right lateral recess. No spinal canal stenosis or neural foraminal narrowing. L3-L4: Trace retrolisthesis and mild disc bulge, eccentric to the right with right extreme lateral disc protrusion, which may contact the exiting right L3 nerve. Moderate facet arthropathy. Narrowing of the right lateral recess. No spinal canal stenosis. Mild right neural foraminal narrowing. L4-L5: Mild disc bulge with superimposed right foraminal and extreme lateral protrusion, which may contact the exiting right L4 nerve. Mild facet arthropathy. Narrowing of the lateral recesses. No spinal canal stenosis. Mild right neural foraminal narrowing. L5-S1:  Disc height loss and mild broad-based disc bulge with superimposed left paracentral disc protrusion. Mild facet arthropathy. No spinal canal stenosis. Moderate right and mild left neural foraminal narrowing. IMPRESSION: CERVICAL SPINE: 1. C5-C6 mild spinal canal stenosis and moderate to severe bilateral neural foraminal narrowing. 2. C7-T1 mild spinal canal stenosis and mild-to-moderate bilateral neural foraminal narrowing. 3. C3-C4 severe right and moderate left neural foraminal narrowing. 4. C4-C5 severe bilateral neural foraminal narrowing. THORACIC SPINE: T1-T2 mild spinal canal stenosis, with moderate left and mild right neural foraminal narrowing. LUMBAR SPINE: 1. L5-S1 moderate right and mild left neural foraminal narrowing. 2. L3-L4 and L4-L5 mild right neural foraminal narrowing. At L3-L4 and L4-L5, right extreme lateral disc protrusion may contact the exiting right L3 and L4 nerves, as well as narrowing of the right lateral recesses, which could affect the descending right L4 and L5 nerve roots, respectively. 3. No spinal canal stenosis. 4. Multilevel facet arthropathy, which can be a cause of back pain. 5. 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 Electronically Signed   By: Wiliam Ke M.D.   On: 08/29/2022 17:45   MR Thoracic Spine Wo Contrast  Result Date: 08/29/2022 CLINICAL DATA:  Balance problems, headaches, neck and back pain EXAM: MRI CERVICAL, THORACIC AND LUMBAR SPINE WITHOUT CONTRAST TECHNIQUE: Multiplanar and multiecho pulse sequences of the cervical spine, to include the craniocervical junction and cervicothoracic junction, and thoracic and lumbar spine, were obtained without intravenous contrast. COMPARISON:  11/16/2021 MRI cervical spine, no prior thoracic or lumbar MRI available FINDINGS: MRI CERVICAL SPINE FINDINGS Alignment: Straightening of the normal cervical  lordosis. Trace anterolisthesis of C4 on C5 and C7 on T1.  Vertebrae: No acute fracture, suspicious osseous lesion, or evidence of discitis. Redemonstrated fusion of C6 and C7, favored to be the sequela of prior surgery. Unchanged vertebral body height loss C5. Cord: Normal signal and morphology. Posterior Fossa, vertebral arteries, paraspinal tissues: Negative. Disc levels: C2-C3: No significant disc bulge. Left-greater-than-right facet arthropathy. No spinal canal stenosis. Mild left neural foraminal narrowing, unchanged. C3-C4: No significant disc bulge. Left-greater-than-right facet arthropathy and bilateral uncovertebral hypertrophy. No spinal canal stenosis. Severe right and moderate left neural foraminal narrowing, unchanged. C4-C5: Trace anterolisthesis with disc unroofing. Right-greater-than-left facet and uncovertebral hypertrophy. No spinal canal stenosis. Severe bilateral neural foraminal narrowing, right greater than left, unchanged C5-C6: Disc height loss and mild disc bulge. Facet and uncovertebral hypertrophy. Mild spinal canal stenosis and moderate to severe bilateral neural foraminal narrowing, unchanged. C6-C7: Osseous fusion across the disc space. Facet and uncovertebral hypertrophy. No spinal canal stenosis. Mild bilateral neural foraminal narrowing, unchanged C7-T1: Trace anterolisthesis and disc unroofing. Facet and uncovertebral hypertrophy. Mild spinal canal stenosis and mild-to-moderate bilateral neural foraminal narrowing, unchanged. MRI THORACIC SPINE FINDINGS Alignment: 3 mm anterolisthesis of T1 on T2. No other listhesis in the thoracic spine. Mild S-shaped curvature of the thoracolumbar spine. Vertebrae: No acute fracture, suspicious osseous lesion, or evidence of discitis. Cord:  Normal signal and morphology. Paraspinal and other soft tissues: No acute finding. Disc levels: T1-T2: Trace anterolisthesis with disc unroofing. Facet arthropathy. Mild spinal canal stenosis. Mild right and moderate left neural foraminal narrowing. No other  significant spinal canal stenosis or neural foraminal narrowing in the thoracic spine. MRI LUMBAR SPINE FINDINGS Segmentation:  5 lumbar type vertebral bodies. Alignment: S-shaped curvature of the thoracolumbar spine. Trace retrolisthesis of L3 on L4. Vertebrae: No acute fracture, suspicious osseous lesion, or evidence of discitis. Conus medullaris and cauda equina: Conus extends to the L1-L2 level. Conus and cauda equina appear normal. Paraspinal and other soft tissues: Increased T2 signal between the spinous processes of L3 and L4 (series 3, image 12). Disc levels: T12-L1: No significant disc bulge. Small left paracentral annular fissure. No spinal canal stenosis or neural foraminal narrowing. L1-L2: Mild disc bulge with right foraminal/extreme lateral protrusion. Mild facet arthropathy. No spinal canal stenosis or neural foraminal narrowing. L2-L3: Mild disc bulge. Mild facet arthropathy. Narrowing of the right lateral recess. No spinal canal stenosis or neural foraminal narrowing. L3-L4: Trace retrolisthesis and mild disc bulge, eccentric to the right with right extreme lateral disc protrusion, which may contact the exiting right L3 nerve. Moderate facet arthropathy. Narrowing of the right lateral recess. No spinal canal stenosis. Mild right neural foraminal narrowing. L4-L5: Mild disc bulge with superimposed right foraminal and extreme lateral protrusion, which may contact the exiting right L4 nerve. Mild facet arthropathy. Narrowing of the lateral recesses. No spinal canal stenosis. Mild right neural foraminal narrowing. L5-S1: Disc height loss and mild broad-based disc bulge with superimposed left paracentral disc protrusion. Mild facet arthropathy. No spinal canal stenosis. Moderate right and mild left neural foraminal narrowing. IMPRESSION: CERVICAL SPINE: 1. C5-C6 mild spinal canal stenosis and moderate to severe bilateral neural foraminal narrowing. 2. C7-T1 mild spinal canal stenosis and mild-to-moderate  bilateral neural foraminal narrowing. 3. C3-C4 severe right and moderate left neural foraminal narrowing. 4. C4-C5 severe bilateral neural foraminal narrowing. THORACIC SPINE: T1-T2 mild spinal canal stenosis, with moderate left and mild right neural foraminal narrowing. LUMBAR SPINE: 1. L5-S1 moderate right and mild left neural foraminal narrowing. 2. L3-L4  and L4-L5 mild right neural foraminal narrowing. At L3-L4 and L4-L5, right extreme lateral disc protrusion may contact the exiting right L3 and L4 nerves, as well as narrowing of the right lateral recesses, which could affect the descending right L4 and L5 nerve roots, respectively. 3. No spinal canal stenosis. 4. Multilevel facet arthropathy, which can be a cause of back pain. 5. 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 Electronically Signed   By: Wiliam Ke M.D.   On: 08/29/2022 17:45       The results of significant diagnostics from this hospitalization (including imaging, microbiology, ancillary and laboratory) are listed below for reference.     Microbiology: No results found for this or any previous visit (from the past 240 hour(s)).   Labs:  CBC: Recent Labs  Lab 09/03/22 1441  WBC 5.7  NEUTROABS 4.3  HGB 14.3  HCT 41.7  MCV 102.0*  PLT 120*   BMP &GFR Recent Labs  Lab 09/03/22 1441  NA 136  K 3.9  CL 99  CO2 26  GLUCOSE 116*  BUN 22  CREATININE 1.02  CALCIUM 8.9   Estimated Creatinine Clearance: 57.8 mL/min (by C-G formula based on SCr of 1.02 mg/dL). Liver & Pancreas: Recent Labs  Lab 09/03/22 1441  AST 23  ALT 26  ALKPHOS 86  BILITOT 0.7  PROT 7.2  ALBUMIN 4.3   No results for input(s): "LIPASE", "AMYLASE" in the last 168 hours. Recent Labs  Lab 09/03/22 1441  AMMONIA <10   Diabetic: Recent Labs    09/04/22 0618  HGBA1C 5.2   Recent Labs  Lab 09/03/22 1419  GLUCAP 100*   Cardiac Enzymes: Recent Labs  Lab 09/03/22 2130   CKTOTAL 83   No results for input(s): "PROBNP" in the last 8760 hours. Coagulation Profile: No results for input(s): "INR", "PROTIME" in the last 168 hours. Thyroid Function Tests: Recent Labs    09/03/22 2130  TSH 3.373   Lipid Profile: Recent Labs    09/03/22 2130  CHOL 163  HDL 50  LDLCALC 92  TRIG 104  CHOLHDL 3.3   Anemia Panel: No results for input(s): "VITAMINB12", "FOLATE", "FERRITIN", "TIBC", "IRON", "RETICCTPCT" in the last 72 hours. Urine analysis:    Component Value Date/Time   COLORURINE YELLOW 09/03/2022 1853   APPEARANCEUR CLEAR 09/03/2022 1853   LABSPEC 1.010 09/03/2022 1853   PHURINE 6.5 09/03/2022 1853   GLUCOSEU NEGATIVE 09/03/2022 1853   HGBUR NEGATIVE 09/03/2022 1853   BILIRUBINUR NEGATIVE 09/03/2022 1853   KETONESUR NEGATIVE 09/03/2022 1853   PROTEINUR NEGATIVE 09/03/2022 1853   NITRITE NEGATIVE 09/03/2022 1853   LEUKOCYTESUR NEGATIVE 09/03/2022 1853   Sepsis Labs: Invalid input(s): "PROCALCITONIN", "LACTICIDVEN"   SIGNED:  Almon Hercules, MD  [facility] 09/04/2022, 1:03 PM

## 2022-09-04 NOTE — Evaluation (Signed)
Physical Therapy Evaluation Patient Details Name: William Montgomery MRN: [id number] DOB: 1940-10-24 Today's Date: 09/04/2022  History of Present Illness  Pt is a 82 y/o male presenting on 4/29 with gait difficulties, weakness, difficulty word finding. CT, MRI negative for acute abnormality. PMH includes: CAD s/p stents, HTN, degenerative disc disease with prior ACDF and lumbar spine sx, CKD, panic attacks, NASH, hernia repair x 2.   Clinical Impression  Pt admitted with above. Pt functioning a mod I/supervision level. Pt reports a dozen falls in last 6 months, a lot of time when leaning forward. Pt with short step height and length, guarded, and cautious with ambulation which patient reports is significantly different than his baseline. Pt and spouse report having a neurologist appt tomorrow 5/1. Pt and spouse to ask neurologist if outpt PT to be beneficial pending potential diagnosis/prognosis from neurologist. Pt with no further acute PT needs at this time. Acute PT to SIGN OFF. Please re-consult if needed in future.       Recommendations for follow up therapy are one component of a multi-disciplinary discharge planning process, led by the attending physician.  Recommendations may be updated based on patient status, additional functional criteria and insurance authorization.  Follow Up Recommendations       Assistance Recommended at Discharge Intermittent Supervision/Assistance  Patient can return home with the following  Help with stairs or ramp for entrance;A little help with walking and/or transfers    Equipment Recommendations None recommended by PT  Recommendations for Other Services       Functional Status Assessment Patient has had a recent decline in their functional status and demonstrates the ability to make significant improvements in function in a reasonable and predictable amount of time.     Precautions / Restrictions Precautions Precautions: None Restrictions Weight  Bearing Restrictions: No      Mobility  Bed Mobility               General bed mobility comments: pt received sitting EOB with OT upon arrival    Transfers Overall transfer level: Needs assistance Equipment used: None Transfers: Sit to/from Stand Sit to Stand: Supervision           General transfer comment: supervision initally for stand from EOB, fading to modified independnece. pt with tendency to loose balance anteriorly with anterior weight shift ie. bending or leaning forward    Ambulation/Gait Ambulation/Gait assistance: Min guard Gait Distance (Feet): 175 Feet Assistive device: None Gait Pattern/deviations: Step-through pattern, Decreased stride length, Wide base of support Gait velocity: dec Gait velocity interpretation: <1.31 ft/sec, indicative of household ambulator   General Gait Details: pt with bilat LEs in external rotation, short step height and length, step through but short step length, no overt LOB but mildly unsteady  Stairs Stairs: Yes Stairs assistance: Min guard Stair Management: One rail Right, Step to pattern, Forwards Number of Stairs: 5 General stair comments: slow, guarded, cautious  Wheelchair Mobility    Modified Rankin (Stroke Patients Only) Modified Rankin (Stroke Patients Only) Pre-Morbid Rankin Score: Moderate disability Modified Rankin: Moderate disability     Balance Overall balance assessment: Mild deficits observed, not formally tested (pt able to step over object without LOB)                                           Pertinent Vitals/Pain Pain Assessment Pain Assessment: No/denies pain  Home Living Family/patient expects to be discharged to:: Private residence Living Arrangements: Spouse/significant other Available Help at Discharge: Family;Available PRN/intermittently Type of Home: House Home Access: Stairs to enter Entrance Stairs-Rails: Right (up) Entrance Stairs-Number of Steps:  4 Alternate Level Stairs-Number of Steps: ' Home Layout: Two level;Bed/bath upstairs;Able to live on main level with bedroom/bathroom (office on 2nd floor) Home Equipment: Grab bars - toilet;Grab bars - tub/shower Additional Comments: spouse working during the day, pt has Charity fundraiser in baseline.  Was going to gym daily (prior to dec), but hasnt been able to recently.  Spouse reports pts abilities worsen with fatigue but still able to manage    Prior Function Prior Level of Function : Independent/Modified Independent;Driving;History of Falls (last six months)               ADLs Comments: indepedent ADLs, iADLs     Hand Dominance   Dominant Hand: Right    Extremity/Trunk Assessment   Upper Extremity Assessment Upper Extremity Assessment: Defer to OT evaluation    Lower Extremity Assessment Lower Extremity Assessment: Overall WFL for tasks assessed    Cervical / Trunk Assessment Cervical / Trunk Assessment: Kyphotic (forward head)  Communication   Communication: No difficulties  Cognition Arousal/Alertness: Awake/alert Behavior During Therapy: WFL for tasks assessed/performed Overall Cognitive Status: Within Functional Limits for tasks assessed                                 General Comments: pt HOH, delayed response time, age appropriate        General Comments General comments (skin integrity, edema, etc.): spouse present, VSS    Exercises     Assessment/Plan    PT Assessment Patient does not need any further PT services  PT Problem List         PT Treatment Interventions      PT Goals (Current goals can be found in the Care Plan section)  Acute Rehab PT Goals Patient Stated Goal: get back to normal PT Goal Formulation: All assessment and education complete, DC therapy    Frequency       Co-evaluation               AM-PAC PT "6 Clicks" Mobility  Outcome Measure Help needed turning from your back to your side while  in a flat bed without using bedrails?: None Help needed moving from lying on your back to sitting on the side of a flat bed without using bedrails?: None Help needed moving to and from a bed to a chair (including a wheelchair)?: None Help needed standing up from a chair using your arms (e.g., wheelchair or bedside chair)?: None Help needed to walk in hospital room?: A Little Help needed climbing 3-5 steps with a railing? : A Little 6 Click Score: 22    End of Session Equipment Utilized During Treatment: Gait belt Activity Tolerance: Patient tolerated treatment well Patient left: in chair;with call bell/phone within reach;with chair alarm set;with family/visitor present Nurse Communication: Mobility status PT Visit Diagnosis: Unsteadiness on feet (R26.81);History of falling (Z91.81)    Time: 1610-9604 PT Time Calculation (min) (ACUTE ONLY): 27 min   Charges:   PT Evaluation $PT Eval Moderate Complexity: 1 Mod          Lewis Shock, PT, DPT Acute Rehabilitation Services Secure chat preferred Office #: [phone]   Iona Hansen 09/04/2022, 9:18 AM

## 2022-09-04 NOTE — Progress Notes (Signed)
  Transition of Care [facility]) Screening Note   Patient Details  Name: JERMON CHALFANT Date of Birth: [birth year]   Transition of Care [facility]) CM/SW Contact:    Baldemar Lenis, LCSW Phone Number: 09/04/2022, 10:13 AM    Transition of Care Department [facility]) has reviewed patient and no TOC needs have been identified at this time. We will continue to monitor patient advancement through interdisciplinary progression rounds. If new patient transition needs arise, please place a TOC consult.

## 2022-09-04 NOTE — TOC Transition Note (Signed)
Transition of Care [facility]) - CM/SW Discharge Note   Patient Details  Name: William Montgomery MRN: [id number] Date of Birth: 12/09/1940  Transition of Care [facility]) CM/SW Contact:  Tom-Kraker, Hershal Coria, RN Phone Number: 09/04/2022, 10:55 AM   Clinical Narrative:     Patient is scheduled for discharge today.  Hospital f/u and discharge instructions on AVS.  No TOC needs or recommendations noted.  Family to transport at discharge.  No further TOC needs noted.          Final next level of care: Home/Self Care Barriers to Discharge: Barriers Resolved   Patient Goals and CMS Choice CMS Medicare.gov Compare Post Acute Care list provided to:: Patient Choice offered to / list presented to : NA  Discharge Placement                  Patient to be transferred to facility by: Family      Discharge Plan and Services Additional resources added to the After Visit Summary for                  DME Arranged: N/A DME Agency: NA       HH Arranged: NA HH Agency: NA        Social Determinants of Health (SDOH) Interventions SDOH Screenings   Food Insecurity: No Food Insecurity (09/03/2022)  Housing: Low Risk  (09/03/2022)  Transportation Needs: No Transportation Needs (09/03/2022)  Utilities: Not At Risk (09/03/2022)  Alcohol Screen: Low Risk  (01/10/2022)  Depression (PHQ2-9): Low Risk  (02/28/2022)  Financial Resource Strain: Low Risk  (01/10/2022)  Physical Activity: Sufficiently Active (01/10/2022)  Social Connections: Socially Isolated (01/10/2022)  Stress: No Stress Concern Present (01/10/2022)  Tobacco Use: Medium Risk (09/03/2022)     Readmission Risk Interventions     No data to display

## 2022-09-04 NOTE — Telephone Encounter (Signed)
Sure Rosanne Ashing. I sent rx to pharmacy just now. Antidepressants generally take at least 2 to 3 weeks to begin to help.  We can increase the dose in a few weeks if you are tolerating it but not seeing a whole lot of improvement. Arrange appt with me in a few weeks. --PM

## 2022-09-05 ENCOUNTER — Telehealth: Payer: Self-pay

## 2022-09-05 ENCOUNTER — Encounter: Payer: Self-pay | Admitting: Neurology

## 2022-09-05 ENCOUNTER — Other Ambulatory Visit (HOSPITAL_BASED_OUTPATIENT_CLINIC_OR_DEPARTMENT_OTHER): Payer: Self-pay

## 2022-09-05 ENCOUNTER — Ambulatory Visit (INDEPENDENT_AMBULATORY_CARE_PROVIDER_SITE_OTHER): Payer: Commercial Managed Care - PPO | Admitting: Neurology

## 2022-09-05 VITALS — BP 131/79 | HR 60 | Ht 69.0 in | Wt 160.0 lb

## 2022-09-05 DIAGNOSIS — R251 Tremor, unspecified: Secondary | ICD-10-CM

## 2022-09-05 DIAGNOSIS — R4789 Other speech disturbances: Secondary | ICD-10-CM | POA: Diagnosis not present

## 2022-09-05 DIAGNOSIS — F482 Pseudobulbar affect: Secondary | ICD-10-CM | POA: Diagnosis not present

## 2022-09-05 DIAGNOSIS — M5481 Occipital neuralgia: Secondary | ICD-10-CM

## 2022-09-05 DIAGNOSIS — R258 Other abnormal involuntary movements: Secondary | ICD-10-CM | POA: Diagnosis not present

## 2022-09-05 DIAGNOSIS — R269 Unspecified abnormalities of gait and mobility: Secondary | ICD-10-CM

## 2022-09-05 DIAGNOSIS — R2689 Other abnormalities of gait and mobility: Secondary | ICD-10-CM | POA: Diagnosis not present

## 2022-09-05 LAB — FOLATE RBC
Folate, Hemolysate: >620 ng/mL
Folate, RBC: >1435 ng/mL (ref >498)
Hematocrit: 43.2 % (ref 37.5–51.0)

## 2022-09-05 LAB — ANA W/REFLEX IF POSITIVE: Anti Nuclear Antibody (ANA): NEGATIVE

## 2022-09-05 LAB — ALDOLASE: Aldolase: 3.6 U/L (ref 3.3–10.3)

## 2022-09-05 LAB — ACETYLCHOLINE RECEPTOR, BINDING: Acety choline binding ab: <0.03 nmol/L (ref 0.00–0.24)

## 2022-09-05 MED ORDER — CARBIDOPA-LEVODOPA 25-100 MG PO TABS
0.5000 | ORAL_TABLET | Freq: Three times a day (TID) | ORAL | 2 refills | Status: DC
Start: 2022-09-05 — End: 2022-09-18
  Filled 2022-09-05: qty 45, 30d supply, fill #0

## 2022-09-05 NOTE — Telephone Encounter (Signed)
Medication Samples have been provided to the patient.  Drug name: Nuedexta       Strength: 20/10        Qty: 13  LOT: [id number]  Exp.Date: 08/25  Dosing instructions: take one tablet daily  The patient has been instructed regarding the correct time, dose, and frequency of taking this medication, including desired effects and most common side effects.   Malena Catholic Shelbi Vaccaro 1:30 PM 09/05/2022

## 2022-09-05 NOTE — Patient Instructions (Signed)
I saw you today for imbalance and other recent progressive decline. I am concerned about a neurodegenerative condition.  I will follow up on the labs from your hospital stay.  I am ordering another brain scan called a DATscan.  We may also get a lumbar puncture to sample the brain/spinal fluid. I will consider this and what we would need to order.  We will try a parkinson's disease medication called Sinemet to see if it helps with your symptoms. You will take a half tab three times per day.  I am giving you a sample of Nuedexta, a medication to help with the emotional episodes you get. Take once per day. There is 13 in the sample. If it works for you, I can order and try to get insurance to approve.  I will be in touch when I have more results. I would like to see you back in clinic in 1 month. Please let me know if you have any questions or concerns in the meantime.   The physicians and staff at [facility] are committed to providing excellent care. You may receive a survey requesting feedback about your experience at our office. We strive to receive "very good" responses to the survey questions. If you feel that your experience would prevent you from giving the office a "very good " response, please contact our office to try to remedy the situation. We may be reached at [phone]. Thank you for taking the time out of your busy day to complete the survey.  Jacquelyne Balint, MD [facility]  Preventing Falls at [facility] are common, often dreaded events in the lives of older people. Aside from the obvious injuries and even death that may result, fall can cause wide-ranging consequences including loss of independence, mental decline, decreased activity and mobility. Younger people are also at risk of falling, especially those with chronic illnesses and fatigue.  Ways to reduce risk for falling Examine diet and medications. Warm foods and alcohol dilate blood vessels, which can lead  to dizziness when standing. Sleep aids, antidepressants and pain medications can also increase the likelihood of a fall.  Get a vision exam. Poor vision, cataracts and glaucoma increase the chances of falling.  Check foot gear. Shoes should fit snugly and have a sturdy, nonskid sole and a broad, low heel  Participate in a physician-approved exercise program to build and maintain muscle strength and improve balance and coordination. Programs that use ankle weights or stretch bands are excellent for muscle-strengthening. Water aerobics programs and low-impact Tai Chi programs have also been shown to improve balance and coordination.  Increase vitamin D intake. Vitamin D improves muscle strength and increases the amount of calcium the body is able to absorb and deposit in bones.  How to prevent falls from common hazards Floors - Remove all loose wires, cords, and throw rugs. Minimize clutter. Make sure rugs are anchored and smooth. Keep furniture in its usual place.  Chairs -- Use chairs with straight backs, armrests and firm seats. Add firm cushions to existing pieces to add height.  Bathroom - Install grab bars and non-skid tape in the tub or shower. Use a bathtub transfer bench or a shower chair with a back support Use an elevated toilet seat and/or safety rails to assist standing from a low surface. Do not use towel racks or bathroom tissue holders to help you stand.  Lighting - Make sure halls, stairways, and entrances are well-lit. Install a night light in your bathroom  or hallway. Make sure there is a light switch at the top and bottom of the staircase. Turn lights on if you get up in the middle of the night. Make sure lamps or light switches are within reach of the bed if you have to get up during the night.  Kitchen - Install non-skid rubber mats near the sink and stove. Clean spills immediately. Store frequently used utensils, pots, pans between waist and eye level. This helps prevent  reaching and bending. Sit when getting things out of lower cupboards.  Living room/ Bedrooms - Place furniture with wide spaces in between, giving enough room to move around. Establish a route through the living room that gives you something to hold onto as you walk.  Stairs - Make sure treads, rails, and rugs are secure. Install a rail on both sides of the stairs. If stairs are a threat, it might be helpful to arrange most of your activities on the lower level to reduce the number of times you must climb the stairs.  Entrances and doorways - Install metal handles on the walls adjacent to the doorknobs of all doors to make it more secure as you travel through the doorway.  Tips for maintaining balance Keep at least one hand free at all times. Try using a backpack or fanny pack to hold things rather than carrying them in your hands. Never carry objects in both hands when walking as this interferes with keeping your balance.  Attempt to swing both arms from front to back while walking. This might require a conscious effort if Parkinson's disease has diminished your movement. It will, however, help you to maintain balance and posture, and reduce fatigue.  Consciously lift your feet off of the ground when walking. Shuffling and dragging of the feet is a common culprit in losing your balance.  When trying to navigate turns, use a "U" technique of facing forward and making a wide turn, rather than pivoting sharply.  Try to stand with your feet shoulder-length apart. When your feet are close together for any length of time, you increase your risk of losing your balance and falling.  Do one thing at a time. Don't try to walk and accomplish another task, such as reading or looking around. The decrease in your automatic reflexes complicates motor function, so the less distraction, the better.  Do not wear rubber or gripping soled shoes, they might "catch" on the floor and cause tripping.  Move slowly  when changing positions. Use deliberate, concentrated movements and, if needed, use a grab bar or walking aid. Count 15 seconds between each movement. For example, when rising from a seated position, wait 15 seconds after standing to begin walking.  If balance is a continuous problem, you might want to consider a walking aid such as a cane, walking stick, or walker. Once you've mastered walking with help, you might be ready to try it on your own again.

## 2022-09-07 LAB — ZINC: Zinc: 84 ug/dL (ref 44–115)

## 2022-09-07 LAB — VITAMIN E
Vitamin E (Alpha Tocopherol): 10.9 mg/L (ref 9.0–29.0)
Vitamin E(Gamma Tocopherol): 0.5 mg/L (ref 0.5–4.9)

## 2022-09-07 LAB — COPPER, SERUM: Copper: 101 ug/dL (ref 69–132)

## 2022-09-09 ENCOUNTER — Encounter: Payer: Self-pay | Admitting: Neurology

## 2022-09-10 ENCOUNTER — Other Ambulatory Visit (HOSPITAL_BASED_OUTPATIENT_CLINIC_OR_DEPARTMENT_OTHER): Payer: Self-pay

## 2022-09-10 ENCOUNTER — Telehealth: Payer: Self-pay | Admitting: Neurology

## 2022-09-10 DIAGNOSIS — F482 Pseudobulbar affect: Secondary | ICD-10-CM

## 2022-09-10 LAB — MULTIPLE MYELOMA PANEL, SERUM
Albumin SerPl Elph-Mcnc: 3.6 g/dL (ref 2.9–4.4)
Albumin/Glob SerPl: 1.3 (ref 0.7–1.7)
Alpha 1: 0.3 g/dL (ref 0.0–0.4)
Alpha2 Glob SerPl Elph-Mcnc: 0.9 g/dL (ref 0.4–1.0)
B-Globulin SerPl Elph-Mcnc: 0.9 g/dL (ref 0.7–1.3)
Gamma Glob SerPl Elph-Mcnc: 0.7 g/dL (ref 0.4–1.8)
Globulin, Total: 2.8 g/dL (ref 2.2–3.9)
IgA: 126 mg/dL (ref 61–437)
IgG (Immunoglobin G), Serum: 770 mg/dL (ref 603–1613)
IgM (Immunoglobulin M), Srm: 60 mg/dL (ref 15–143)
Total Protein ELP: 6.4 g/dL (ref 6.0–8.5)

## 2022-09-10 MED ORDER — DEXTROMETHORPHAN-QUINIDINE 20-10 MG PO CAPS
1.0000 | ORAL_CAPSULE | Freq: Two times a day (BID) | ORAL | 5 refills | Status: DC
Start: 2022-09-10 — End: 2023-03-02
  Filled 2022-09-10: qty 60, 30d supply, fill #0
  Filled 2022-10-09: qty 60, 30d supply, fill #1
  Filled 2022-11-08: qty 60, 30d supply, fill #2
  Filled 2022-12-08: qty 60, 30d supply, fill #3
  Filled 2023-01-06: qty 60, 30d supply, fill #4
  Filled 2023-02-02: qty 60, 30d supply, fill #5

## 2022-09-10 NOTE — Telephone Encounter (Signed)
Called patient to discuss the labs that were pending from hospitalization that have since resulted. The pending labs were normal (including vit E, copper, zinc, AChR ab, folate, IFE).   Patient feels his mood has improved since started Nuedexta and Sinemet. 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 I sent this to his pharmacy.  Patient is tolerating Sinemet well. He has not noticed any difference in symptoms other than mood at this point.  Patient noted that he is not taking Lexapro and does not plan to. He will discuss this with PCP. I agreed with this given he is on Sinemet.   All questions were answered.  Jacquelyne Balint, MD [facility]

## 2022-09-11 ENCOUNTER — Ambulatory Visit (INDEPENDENT_AMBULATORY_CARE_PROVIDER_SITE_OTHER): Payer: Commercial Managed Care - PPO

## 2022-09-11 ENCOUNTER — Other Ambulatory Visit (HOSPITAL_BASED_OUTPATIENT_CLINIC_OR_DEPARTMENT_OTHER): Payer: Self-pay

## 2022-09-11 ENCOUNTER — Encounter: Payer: Self-pay | Admitting: Family Medicine

## 2022-09-11 ENCOUNTER — Ambulatory Visit (INDEPENDENT_AMBULATORY_CARE_PROVIDER_SITE_OTHER): Payer: Commercial Managed Care - PPO | Admitting: Family Medicine

## 2022-09-11 VITALS — BP 135/77 | HR 60 | Wt 161.2 lb

## 2022-09-11 DIAGNOSIS — Z0000 Encounter for general adult medical examination without abnormal findings: Secondary | ICD-10-CM | POA: Diagnosis not present

## 2022-09-11 DIAGNOSIS — G20B2 Parkinson's disease with dyskinesia, with fluctuations: Secondary | ICD-10-CM | POA: Diagnosis not present

## 2022-09-11 DIAGNOSIS — F482 Pseudobulbar affect: Secondary | ICD-10-CM

## 2022-09-11 NOTE — Progress Notes (Signed)
Subjective:   William Montgomery is a 82 y.o. male who presents for Medicare Annual/Subsequent preventive examination.  I connected with  William Montgomery on 09/11/22 by an audio only telemedicine application and verified that I am speaking with the correct person using two identifiers.   I discussed the limitations, risks, security and privacy concerns of performing an evaluation and management service by telephone and the availability of in person appointments. I also discussed with the patient that there may be a patient responsible charge related to this service. The patient expressed understanding and verbally consented to this telephonic visit.  Location of Patient:  Location of Provider:  List any persons and their role that are participating in the visit with the patient.     Review of Systems    Defer to PCP Cardiac Risk Factors include: advanced age (>51men, >58 women);dyslipidemia;hypertension;male gender     Objective:    Today's Vitals   09/11/22 1341  PainSc: 0-No pain   There is no height or weight on file to calculate BMI.     09/11/2022    1:44 PM 09/05/2022   10:55 AM 09/03/2022    8:23 PM 09/03/2022    2:13 PM 09/15/2021    8:40 AM 09/06/2021    7:59 AM 08/31/2020    8:20 AM  Advanced Directives  Does Patient Have a Medical Advance Directive? Yes Yes Yes Yes Yes Yes Yes  Type of Estate agent of William Montgomery;Living will Healthcare Power of [place];Living will Healthcare Power of [place];Living will Living will;Healthcare Power of [employer] Power of [employer] Power of [employer] Power of [place];Living will  Does patient want to make changes to medical advance directive? No - Patient declined  No - Patient declined      Copy of Healthcare Power of Attorney in Chart? Yes - validated most recent copy scanned in chart (See row information)  Yes - validated most recent copy scanned in chart (See row information)   Yes -  validated most recent copy scanned in chart (See row information) Yes - validated most recent copy scanned in chart (See row information)    Current Medications (verified) Outpatient Encounter Medications as of 09/11/2022  Medication Sig   carbidopa-levodopa (SINEMET IR) 25-100 MG tablet Take 0.5 tablets by mouth 3 (three) times daily.   Cholecalciferol (VITAMIN D3) 5000 units CAPS Take 5,000 Units by mouth daily.   Coenzyme Q10 (COQ10) 100 MG CAPS Take 100 mg by mouth every evening. (Patient not taking: Reported on 09/05/2022)   Dextromethorphan-quiNIDine (NUEDEXTA) 20-10 MG capsule Take 1 capsule by mouth 2 (two) times daily.   Multiple Vitamins-Minerals (ICAPS AREDS 2 PO) Take 1 capsule by mouth daily. (Patient not taking: Reported on 09/11/2022)   nitroGLYCERIN (NITROSTAT) 0.4 MG SL tablet Place 1 tablet (0.4 mg total) under the tongue every 5 (five) minutes as needed for chest pain. (Patient not taking: Reported on 09/11/2022)   propranolol (INDERAL) 10 MG tablet Take 1-2 tablets (10-20 mg total) by mouth 2 (two) times daily as needed for anxiety / panic (Patient not taking: Reported on 09/05/2022)   No facility-administered encounter medications on file as of 09/11/2022.    Allergies (verified) Ace inhibitors and Imdur [isosorbide dinitrate]   History: Past Medical History:  Diagnosis Date   Arthritis    joints/  back/  neck   Chronic headaches    due to cervical spondylosis   Chronic renal insufficiency, stage 2 (mild)    borderlines II/III  Coronary artery disease 1999   cardiologist--- William Montgomery---  (per cardiology note pt refused to take asa/ statin) hx PCI w/ stenting in 1999 to LAD and 12-23-2007 x2 DES to proximal LAD;  Cath 03/2017-->  non- critical 70 - 80% mid/distal circ stenosis,  chronic small D1 80% stenosis (stable) and DES to proximal and mid LAD--   Med mgmt rec'd-->nitrates caused HAs, ACE-I caused cough.   DDD (degenerative disc disease)    lumbar and cervical neck    Diverticulosis of sigmoid colon 07/25/2011   Severe (endoscopy by William. Jarold Montgomery)   Erectile dysfunction after radical prostatectomy    History of kidney stones    History of prostate cancer 12/2014   urologist-- William Montgomery---   s/p Robot assisted radical prostatectomy 07/2015: lymph node involvement//   undetactable PSA since   HTN (hypertension)    ACE-I cough.  Fine on ARB.   Hx of blastomycosis 1978   s/p  right upper lobectomy   Hyperlipidemia    NASH (nonalcoholic steatohepatitis) 06/2011   normal LFTS  /  Hep B and C testing neg.  Mild fatty liver on CT 2020   Nocturia    Parkinson's disease    Pseudobulbar affect    Pulmonary nodules    small, picked up on CT ab/pelv for abd pains/flank pains-->pt quit smoking 40 yrs ago.  Low risk for lung ca so no f/u CT needed.   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   Tinnitus    Wears glasses    Wears hearing aid in both ears    Past Surgical History:  Procedure Laterality Date   ANTERIOR CERVICAL DECOMP/DISCECTOMY FUSION  1982   C6--7   APPENDECTOMY  1953   CARDIAC CATHETERIZATION  02/27/2000   @[facility]  by William William Montgomery ---  single vessel w/ preserved LVSF   CORONARY ANGIOPLASTY WITH STENT PLACEMENT  12/23/2007   by William William Montgomery--- PCI w/ DES x2 to  pLAD   CORONARY STENT PLACEMENT  1999   PTCA w/ stenting to LAd   CYSTOSCOPY WITH RETROGRADE PYELOGRAM, URETEROSCOPY AND STENT PLACEMENT Left 05/10/2018   Procedure: CYSTOSCOPY WITH LEFT RETROGRADE PYELOGRAM, LEFT URETEROSCOPY AND LEFT URETERAL STENT PLACEMENT;  Surgeon: William Ache, MD;  Location: [facility];  Service: Urology;  Laterality: Left;   HOLMIUM LASER APPLICATION Left 05/10/2018   Procedure: HOLMIUM LASER APPLICATION;  Surgeon: William Ache, MD;  Location: [facility];  Service: Urology;  Laterality: Left;    INCISIONAL HERNIA REPAIR N/A 03/19/2019   Procedure: LAPAROSCOPIC INCISIONAL HERNIA REPAIR WITH MESH, RECURRENT UMBILICAL HERNIA REPAIR;  Surgeon: William Soda, MD;  Location: [facility];  Service: General;  Laterality: N/A;   INGUINAL HERNIA REPAIR Right 03/19/2019   Procedure: LAPAROSCOPIC BILATERAL FEMORAL AND INGUINAL HERNIA REPAIR WITH MESH, LYSIS OF ADHESIONS;  Surgeon: William Soda, MD;  Location: [facility];  Service: General;  Laterality: Right;   LEFT HEART CATH AND CORONARY ANGIOGRAPHY N/A 03/21/2017   Stents patent; mod circ dz, nothing for intervention--imdur added.  Procedure: LEFT HEART CATH AND CORONARY ANGIOGRAPHY;  Surgeon: Yvonne Kendall, MD;  Location: [facility];  Service: Cardiovascular;  Laterality: N/A;   LUMBAR SPINE SURGERY  1978   L5--S1   LUNG LOBECTOMY Right 1978   right upper lobectomy for blastomycosis  LYMPHADENECTOMY Bilateral 07/14/2015   Procedure: LYMPHADENECTOMY;  Surgeon: Heloise Purpura, MD;  Location: [facility];  Service: Urology;  Laterality: Bilateral;   ROBOT ASSISTED LAPAROSCOPIC RADICAL PROSTATECTOMY N/A 07/14/2015   One positive pelvic LN.  Procedure: XI ROBOTIC ASSISTED LAPAROSCOPIC RADICAL PROSTATECTOMY LEVEL 2;  Surgeon: Heloise Purpura, MD;  Location: [facility];  Service: Urology;  Laterality: N/A;   ROTATOR CUFF REPAIR  2007   UMBILICAL HERNIA REPAIR  07/14/2015   Procedure: HERNIA REPAIR UMBILICAL ADULT;  Surgeon: Heloise Purpura, MD;  Location: [facility];  Service: Urology;;   Family History  Problem Relation Age of Onset   Cervical cancer Mother        deceased   Cirrhosis Father    Colon cancer Neg Hx    Social History   Socioeconomic History   Marital status: Married    Spouse name: Not on file   Number of children: 2   Years of education: Not on file   Highest education level: Master's degree (e.g., MA, MS, MEng, MEd, MSW, MBA)  Occupational History   Occupation: Retired  Tobacco Use   Smoking status: Former    Packs/day: 0.50     Years: 20.00    Additional pack years: 0.00    Total pack years: 10.00    Types: Cigarettes    Quit date: 1978    Years since quitting: 46.3   Smokeless tobacco: Former    Types: Chew    Quit date: 2000   Tobacco comments:    Theme park manager Use: Never used  Substance and Sexual Activity   Alcohol use: Yes    Comment: seldom   Drug use: Never   Sexual activity: Not on file  Other Topics Concern   Not on file  Social History Narrative   Married, 2 adult children ([place] and [place]).   Lives in [place].     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   Tob (smoke and chew) x 20 yrs, quit approx 1980s.   Alcohol: very rare.   One cup coffee each morning.   Exercise: walks 3 miles about 4 times per week.   Active lifestyle   Two story home   Right handed   Social Determinants of Health   Financial Resource Strain: Low Risk  (09/11/2022)   Overall Financial Resource Strain (CARDIA)    Difficulty of Paying Living Expenses: Not very hard  Food Insecurity: No Food Insecurity (09/11/2022)   Hunger Vital Sign    Worried About Running Out of Food in the Last Year: Never true    Ran Out of Food in the Last Year: Never true  Transportation Needs: No Transportation Needs (09/11/2022)   PRAPARE - Administrator, Civil Service (Medical): No    Lack of Transportation (Non-Medical): No  Physical Activity: Insufficiently Active (09/11/2022)   Exercise Vital Sign    Days of Exercise per Week: 4 days    Minutes of Exercise per Session: 30 min  Stress: No Stress Concern Present (09/11/2022)   [employer] of Occupational Health - Occupational Stress Questionnaire    Feeling of Stress : Not at all  Social Connections: Moderately Isolated (09/11/2022)   Social Connection and Isolation Panel [NHANES]    Frequency of Communication with Friends and Family: Twice a week    Frequency of Social Gatherings with Friends and Family: Once a week    Attends Religious  Services: Never    Active  Member of Clubs or Organizations: No    Attends Engineer, structural: Never    Marital Status: Married    Tobacco Counseling Counseling given: Not Answered Tobacco comments: Scoal   Clinical Intake:  Pre-visit preparation completed: No  Pain : No/denies pain Pain Score: 0-No pain     Nutritional Risks: None  How often do you need to have someone help you when you read instructions, pamphlets, or other written materials from your doctor or pharmacy?: 1 - Never What is the last grade level you completed in school?: Master's degree  Diabetic?no  Interpreter Needed?: No      Activities of Daily Living    09/11/2022    1:44 PM 09/03/2022    8:23 PM  In your present state of health, do you have any difficulty performing the following activities:  Hearing? 1 1  Vision? 0 0  Difficulty concentrating or making decisions? 0 0  Walking or climbing stairs? 1 1  Dressing or bathing? 0 0  Doing errands, shopping? 0 0  Preparing Food and eating ? N   Using the Toilet? N   In the past six months, have you accidently leaked urine? N   Do you have problems with loss of bowel control? N   Managing your Medications? N   Managing your Finances? N   Housekeeping or managing your Housekeeping? N     Patient Care Team: Jeoffrey Massed, MD as PCP - General Rollene Rotunda, MD as PCP - Cardiology (Cardiology) Heloise Purpura, MD as Consulting Physician (Urology) Rollene Rotunda, MD as Consulting Physician (Cardiology) William Montgomery, Cristal Deer, MD as Consulting Physician (Cardiology) [place], Elvin So, MD as Referring Physician (Dermatology) Berneice Heinrich Delbert Phenix., MD as Consulting Physician (Urology) William Soda, MD as Consulting Physician (General Surgery) Danis, Andreas Blower, MD as Consulting Physician (Gastroenterology) Antony Madura, MD as Consulting Physician (Neurology)  Indicate any recent Medical Services you may have received from other  than [facility] providers in the past year (date may be approximate).     Assessment:   This is a routine wellness examination for Sahar.  Hearing/Vision screen No results found.  Dietary issues and exercise activities discussed: Current Exercise Habits: Home exercise routine, Type of exercise: walking, Time (Minutes): 30, Frequency (Times/Week): 4, Weekly Exercise (Minutes/Week): 120   Goals Addressed             This Visit's Progress    Patient Stated       Stay alive      Depression Screen    09/11/2022    1:47 PM 02/28/2022    8:37 AM 01/11/2022    8:08 AM 09/06/2021    7:57 AM 07/10/2021    8:05 AM 08/31/2020    8:22 AM 07/01/2020    9:49 AM  PHQ 2/9 Scores  PHQ - 2 Score 0 0 0 0 0 0 0    Fall Risk    09/11/2022    1:52 PM 09/05/2022   10:55 AM 02/28/2022    8:37 AM 01/11/2022    8:08 AM 01/10/2022   11:28 AM  Fall Risk   Falls in the past year? 1 1 0 0 0  Number falls in past yr: 1 1 0 0   Injury with Fall? 0 0 0 0   Risk for fall due to : History of fall(s)  Impaired vision Impaired vision   Follow up Falls evaluation completed Falls evaluation completed Falls evaluation completed Falls evaluation completed  FALL RISK PREVENTION PERTAINING TO THE HOME:  Any stairs in or around the home? Yes  If so, are there any without handrails? No  Home free of loose throw rugs in walkways, pet beds, electrical cords, etc? Yes  Adequate lighting in your home to reduce risk of falls? Yes   ASSISTIVE DEVICES UTILIZED TO PREVENT FALLS:  Life alert? No  Use of a cane, walker or w/c? No  Grab bars in the bathroom? Yes  Shower chair or bench in shower? No  Elevated toilet seat or a handicapped toilet? Yes   TIMED UP AND GO:  Was the test performed? No .  Length of time to ambulate 10 feet: n/a sec.     Cognitive Function:    05/30/2018    9:13 AM 05/03/2017    8:20 AM  MMSE - Mini Mental State Exam  Orientation to time 5 5  Orientation to Place 5 5  Registration 3 3   Attention/ Calculation 5 3  Recall 2 3  Language- name 2 objects 2 2  Language- repeat 1 1  Language- follow 3 step command 3 3  Language- read & follow direction 1 1  Write a sentence 1 1  Copy design 1 1  Total score 29 28        09/11/2022    1:45 PM 09/06/2021    8:00 AM 08/31/2020    8:26 AM  6CIT Screen  What Year? 0 points 0 points 0 points  What month? 0 points 0 points 0 points  What time? 0 points 0 points 0 points  Count back from 20 0 points 0 points 0 points  Months in reverse 0 points 0 points 0 points  Repeat phrase 0 points 0 points 0 points  Total Score 0 points 0 points 0 points    Immunizations Immunization History  Administered Date(s) Administered   Fluad Quad(high Dose 65+) 01/08/2019, 01/11/2022   Influenza Split 02/18/2012   Influenza Whole 02/04/2010, 02/09/2011   Influenza, High Dose Seasonal PF 02/12/2014, 03/25/2015, 01/31/2017, 01/28/2018   Influenza,inj,Quad PF,6+ Mos 01/30/2013, 01/20/2016   Influenza-Unspecified 02/07/2020   PFIZER(Purple Top)SARS-COV-2 Vaccination 07/16/2019, 08/06/2019, 02/07/2020   Pneumococcal Conjugate-13 03/19/2014   Pneumococcal Polysaccharide-23 05/08/2007   Td 05/07/2006   Tdap 10/26/2016   Zoster Recombinat (Shingrix) 06/06/2018, 10/23/2018   Zoster, Live 10/31/2011    TDAP status: Up to date  Flu Vaccine status: Up to date  Pneumococcal vaccine status: Up to date  Covid-19 vaccine status: Completed vaccines  Qualifies for Shingles Vaccine? Yes   Zostavax completed No   Shingrix Completed?: Yes  Screening Tests Health Maintenance  Topic Date Due   COVID-19 Vaccine (4 - 2023-24 season) 09/27/2022 (Originally 01/05/2022)   INFLUENZA VACCINE  12/06/2022   Medicare Annual Wellness (AWV)  09/11/2023   DTaP/Tdap/Td (3 - Td or Tdap) 10/27/2026   Pneumonia Vaccine 13+ Years old  Completed   Zoster Vaccines- Shingrix  Completed   HPV VACCINES  Aged Out    Health Maintenance  There are no preventive care  reminders to display for this patient.   Colorectal cancer screening: No longer required.   Lung Cancer Screening: (Low Dose CT Chest recommended if Age 11-80 years, 30 pack-year currently smoking OR have quit w/in 15years.) does not qualify.   Lung Cancer Screening Referral: n/a  Additional Screening:  Hepatitis C Screening: does not qualify; Completed n/a  Vision Screening: Recommended annual ophthalmology exams for early detection of glaucoma and other disorders of the eye.  Is the patient up to date with their annual eye exam?  Yes  Who is the provider or what is the name of the office in which the patient attends annual eye exams? [facility] If pt is not established with a provider, would they like to be referred to a provider to establish care? No .   Dental Screening: Recommended annual dental exams for proper oral hygiene  Community Resource Referral / Chronic Care Management: CRR required this visit?  No   CCM required this visit?  No      Plan:     I have personally reviewed and noted the following in the patient's chart:   Medical and social history Use of alcohol, tobacco or illicit drugs  Current medications and supplements including opioid prescriptions. Patient is not currently taking opioid prescriptions. Functional ability and status Nutritional status Physical activity Advanced directives List of other physicians Hospitalizations, surgeries, and ER visits in previous 12 months Vitals Screenings to include cognitive, depression, and falls Referrals and appointments  In addition, I have reviewed and discussed with patient certain preventive protocols, quality metrics, and best practice recommendations. A written personalized care plan for preventive services as well as general preventive health recommendations were provided to patient.     Filomena Jungling, CMA   09/11/2022   Nurse Notes: Non-Face to Face or Face to Face 11 minute visit  Encounter    Mr. Debenedetti , Thank you for taking time to come for your Medicare Wellness Visit. I appreciate your ongoing commitment to your health goals. Please review the following plan we discussed and let me know if I can assist you in the future.   These are the goals we discussed:  Goals      Patient Stated     Maintain current health.      Patient Stated     Stay healthy      Patient Stated     Stay alive        This is a list of the screening recommended for you and due dates:  Health Maintenance  Topic Date Due   COVID-19 Vaccine (4 - 2023-24 season) 09/27/2022*   Flu Shot  12/06/2022   Medicare Annual Wellness Visit  09/11/2023   DTaP/Tdap/Td vaccine (3 - Td or Tdap) 10/27/2026   Pneumonia Vaccine  Completed   Zoster (Shingles) Vaccine  Completed   HPV Vaccine  Aged Out  *Topic was postponed. The date shown is not the original due date.

## 2022-09-11 NOTE — Progress Notes (Signed)
09/11/2022  CC:  Chief Complaint  Patient presents with   Follow-up    Hospital follow up    Patient is a 82 y.o. Caucasian male who presents for  hospital follow up. Dates hospitalized: 09/03/2022 to 09/04/2022. Days since d/c from hospital: 7 Patient was discharged from hospital to home. Reason for admission to hospital: Generalized weakness with progressive worsening of gait instability, speech difficulty, and emotional/mood instability.   I have reviewed patient's discharge summary plus pertinent specific notes, labs, and imaging from the hospitalization.    William Montgomery was admitted in order to expedite his workup. He was given Attention by the hospitalists and neurologist.  Workup included imaging which ruled out structural abnormalities.  An extensive lab evaluation was done as well and was normal. He established care as an outpatient with neurologist, Dr. Loleta Chance, the day after he was discharged from the hospital.  Dr. Adaline Sill assessment revealed pseudobulbar affect, increased tone, decreased rapid alternating movements of left upper extremity, and hyperreflexia.  Additionally, primitive reflexes such as facial and midline reflexes were present.  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  William Montgomery says that his mood is more stable--he can control his emotions better.  He had a couple of conversations with his daughters that he says he usually would have broken down and cried during but he was able to compose himself. He does not feel any significant improvement yet from a movement or weakness standpoint.  He is taking the Sinemet and Nuedexta without problem.  William Montgomery called to request antidepressant on 09/04/2022 and I sent in a prescription for Lexapro 10 mg. However, he chose not to start this since he was started on the other medications. I agree with this.  Medication reconciliation was done today and patient is taking meds as recommended by discharging  hospitalist/specialist.    ROS as above, plus--> no fevers, no CP, no SOB, no wheezing, no cough, no dizziness, no HAs, no rashes, no melena/hematochezia.  No polyuria or polydipsia.  No myalgias or arthralgias.  No acute vision or hearing abnormalities.  No dysuria or unusual/new urinary urgency or frequency.  No recent changes in lower legs. No n/v/d or abd pain.  No palpitations.    PMH:  Past Medical History:  Diagnosis Date   Arthritis    joints/  back/  neck   Chronic headaches    due to cervical spondylosis   Chronic renal insufficiency, stage 2 (mild)    borderlines II/III   Coronary artery disease 1999   cardiologist--- dr Antoine Poche---  (per cardiology note pt refused to take asa/ statin) hx PCI w/ stenting in 1999 to LAD and 12-23-2007 x2 DES to proximal LAD;  Cath 03/2017-->  non- critical 70 - 80% mid/distal circ stenosis,  chronic small D1 80% stenosis (stable) and DES to proximal and mid LAD--   Med mgmt rec'd-->nitrates caused HAs, ACE-I caused cough.   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 Jarold Motto)   Erectile dysfunction after radical prostatectomy    History of kidney stones    History of prostate cancer 12/2014   urologist-- dr Retta Diones---   s/p Robot assisted radical prostatectomy 07/2015: lymph node involvement//   undetactable PSA since   HTN (hypertension)    ACE-I cough.  Fine on ARB.   Hx of blastomycosis 1978   s/p  right upper lobectomy   Hyperlipidemia    NASH (nonalcoholic  steatohepatitis) 06/2011   normal LFTS  /  Hep B and C testing neg.  Mild fatty liver on CT 2020   Nocturia    Pulmonary nodules    small, picked up on CT ab/pelv for abd pains/flank pains-->pt quit smoking 40 yrs ago.  Low risk for lung ca so no f/u CT needed.   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   Tinnitus    Wears glasses    Wears hearing aid in both ears     PSH:  Past Surgical History:  Procedure Laterality Date   ANTERIOR CERVICAL DECOMP/DISCECTOMY FUSION  1982   C6--7   APPENDECTOMY  1953   CARDIAC CATHETERIZATION  02/27/2000   @[facility]  by dr hochrein ---  single vessel w/ preserved LVSF   CORONARY ANGIOPLASTY WITH STENT PLACEMENT  12/23/2007   by Dr End--- PCI w/ DES x2 to  pLAD   CORONARY STENT PLACEMENT  1999   PTCA w/ stenting to LAd   CYSTOSCOPY WITH RETROGRADE PYELOGRAM, URETEROSCOPY AND STENT PLACEMENT Left 05/10/2018   Procedure: CYSTOSCOPY WITH LEFT RETROGRADE PYELOGRAM, LEFT URETEROSCOPY AND LEFT URETERAL STENT PLACEMENT;  Surgeon: Sebastian Ache, MD;  Location: [facility];  Service: Urology;  Laterality: Left;   HOLMIUM LASER APPLICATION Left 05/10/2018   Procedure: HOLMIUM LASER APPLICATION;  Surgeon: Sebastian Ache, MD;  Location: [facility];  Service: Urology;  Laterality: Left;   INCISIONAL HERNIA REPAIR N/A 03/19/2019   Procedure: LAPAROSCOPIC INCISIONAL HERNIA REPAIR WITH MESH, RECURRENT UMBILICAL HERNIA REPAIR;  Surgeon: Karie Soda, MD;  Location: [facility];  Service: General;  Laterality: N/A;   INGUINAL HERNIA REPAIR Right 03/19/2019   Procedure: LAPAROSCOPIC BILATERAL FEMORAL AND INGUINAL HERNIA REPAIR WITH MESH, LYSIS OF ADHESIONS;  Surgeon: Karie Soda, MD;  Location: [facility];  Service: General;  Laterality: Right;   LEFT HEART CATH AND CORONARY ANGIOGRAPHY N/A 03/21/2017   Stents patent; mod circ dz, nothing for intervention--imdur added.  Procedure: LEFT HEART CATH AND CORONARY ANGIOGRAPHY;  Surgeon: Yvonne Kendall, MD;  Location: [facility];  Service: Cardiovascular;  Laterality: N/A;   LUMBAR SPINE SURGERY  1978   L5--S1   LUNG LOBECTOMY Right 1978   right upper lobectomy for blastomycosis   LYMPHADENECTOMY Bilateral 07/14/2015   Procedure: LYMPHADENECTOMY;   Surgeon: Heloise Purpura, MD;  Location: [facility];  Service: Urology;  Laterality: Bilateral;   ROBOT ASSISTED LAPAROSCOPIC RADICAL PROSTATECTOMY N/A 07/14/2015   One positive pelvic LN.  Procedure: XI ROBOTIC ASSISTED LAPAROSCOPIC RADICAL PROSTATECTOMY LEVEL 2;  Surgeon: Heloise Purpura, MD;  Location: [facility];  Service: Urology;  Laterality: N/A;   ROTATOR CUFF REPAIR  2007   UMBILICAL HERNIA REPAIR  07/14/2015   Procedure: HERNIA REPAIR UMBILICAL ADULT;  Surgeon: Heloise Purpura, MD;  Location: [facility];  Service: Urology;;    MEDS:  Outpatient Medications Prior to Visit  Medication Sig Dispense Refill   carbidopa-levodopa (SINEMET IR) 25-100 MG tablet Take 0.5 tablets by mouth 3 (three) times daily. 45 tablet 2   Dextromethorphan-quiNIDine (NUEDEXTA) 20-10 MG capsule Take 1 capsule by mouth 2 (two) times daily. 60 capsule 5   ALPRAZolam (XANAX) 1 MG tablet Take 1 tablet (1 mg total) by mouth daily as needed. (Patient not taking: Reported on 09/05/2022) 2 tablet 1   carisoprodol (SOMA)  350 MG tablet Take 1 tablet by mouth 4 times daily as needed for muscle spasms 60 tablet 3   Cholecalciferol (VITAMIN D3) 5000 units CAPS Take 5,000 Units by mouth daily.     Coenzyme Q10 (COQ10) 100 MG CAPS Take 100 mg by mouth every evening. (Patient not taking: Reported on 09/05/2022)     escitalopram (LEXAPRO) 10 MG tablet Take 1 tablet (10 mg total) by mouth daily. (Patient not taking: Reported on 09/11/2022) 30 tablet 0   Multiple Vitamins-Minerals (ICAPS AREDS 2 PO) Take 1 capsule by mouth daily. (Patient not taking: Reported on 09/11/2022)     niacin (VITAMIN B3) 500 MG tablet Take 500 mg by mouth at bedtime. (Patient not taking: Reported on 09/05/2022)     nitroGLYCERIN (NITROSTAT) 0.4 MG SL tablet Place 1 tablet (0.4 mg total) under the tongue every 5 (five) minutes as needed for chest pain. (Patient not taking: Reported on 09/11/2022) 25 tablet 3   propranolol (INDERAL) 10 MG tablet Take 1-2 tablets (10-20 mg total) by  mouth 2 (two) times daily as needed for anxiety / panic (Patient not taking: Reported on 09/05/2022) 30 tablet 1   acetaminophen (TYLENOL) 500 MG tablet Take 500 mg by mouth every 6 (six) hours as needed. (Patient not taking: Reported on 09/05/2022)     Facility-Administered Medications Prior to Visit  Medication Dose Route Frequency Provider Last Rate Last Admin   gentamicin (GARAMYCIN) 350 mg in dextrose 5 % 50 mL IVPB  5 mg/kg Intravenous 30 min Pre-Op Rexford Maus, [facility]        Physical Exam    09/11/2022   11:18 AM 09/05/2022   10:53 AM 09/04/2022    5:00 AM  Vitals with BMI  Height  5\' 9"    Weight 161 lbs 3 oz 160 lbs 160 lbs 15 oz  BMI 23.79 23.62 23.76  Systolic 135 131   Diastolic 77 79   Pulse 60 60    Gen: Alert, well appearing.  Patient is oriented to person, place, time, and situation. A bit sad at times but he was able to control it. Made good eye contact.  He had a bit of trouble getting words out at times.  Speech was slightly slow. No obvious tremor.  He did walk with a very small-stepped gait, knees not fully extending, slightly wide-based, not quite shuffling.  He walked with a fair amount of caution in order to not fall forward.  Pertinent labs/imaging Last CBC Lab Results  Component Value Date   WBC 5.7 09/03/2022   HGB 14.3 09/03/2022   HCT 43.2 09/03/2022   MCV 102.0 (H) 09/03/2022   MCH 35.0 (H) 09/03/2022   RDW 12.8 09/03/2022   PLT 120 (L) 09/03/2022   Last metabolic panel Lab Results  Component Value Date   GLUCOSE 116 (H) 09/03/2022   NA 136 09/03/2022   K 3.9 09/03/2022   CL 99 09/03/2022   CO2 26 09/03/2022   BUN 22 09/03/2022   CREATININE 1.02 09/03/2022   GFRNONAA >60 09/03/2022   CALCIUM 8.9 09/03/2022   PROT 7.2 09/03/2022   ALBUMIN 4.3 09/03/2022   LABGLOB 2.8 09/04/2022   BILITOT 0.7 09/03/2022   ALKPHOS 86 09/03/2022   AST 23 09/03/2022   ALT 26 09/03/2022   ANIONGAP 11 09/03/2022    ASSESSMENT/PLAN:  Parkinson's disease  suspected. He is followed by Dr. Loleta Chance in neurology, who is doing a phenomenal job with William Montgomery. He will get DaT scan. He will continue Sinemet IR  25-100, 0.5 tabs 3 times a day.  He will also continue Nuedexta 20-10 mg, 1 twice daily.  We'll continue to hold off on lexapro. We are holding off on the Lexapro at this time. No labs needed today.  FOLLOW UP: In a few weeks after he sees the neurologist again.  Signed:  Santiago Bumpers, MD           09/11/2022

## 2022-09-11 NOTE — Addendum Note (Signed)
Addended by: Jeoffrey Massed on: 09/11/2022 01:56 PM   Modules accepted: Level of Service

## 2022-09-11 NOTE — Patient Instructions (Signed)
Health Maintenance, Male Adopting a healthy lifestyle and getting preventive care are important in promoting health and wellness. Ask your health care provider about: The right schedule for you to have regular tests and exams. Things you can do on your own to prevent diseases and keep yourself healthy. What should I know about diet, weight, and exercise? Eat a healthy diet  Eat a diet that includes plenty of vegetables, fruits, low-fat dairy products, and lean protein. Do not eat a lot of foods that are high in solid fats, added sugars, or sodium. Maintain a healthy weight Body mass index (BMI) is a measurement that can be used to identify possible weight problems. It estimates body fat based on height and weight. Your health care provider can help determine your BMI and help you achieve or maintain a healthy weight. Get regular exercise Get regular exercise. This is one of the most important things you can do for your health. Most adults should: Exercise for at least 150 minutes each week. The exercise should increase your heart rate and make you sweat (moderate-intensity exercise). Do strengthening exercises at least twice a week. This is in addition to the moderate-intensity exercise. Spend less time sitting. Even light physical activity can be beneficial. Watch cholesterol and blood lipids Have your blood tested for lipids and cholesterol at 81 years of age, then have this test every 5 years. You may need to have your cholesterol levels checked more often if: Your lipid or cholesterol levels are high. You are older than 81 years of age. You are at high risk for heart disease. What should I know about cancer screening? Many types of cancers can be detected early and may often be prevented. Depending on your health history and family history, you may need to have cancer screening at various ages. This may include screening for: Colorectal cancer. Prostate cancer. Skin cancer. Lung  cancer. What should I know about heart disease, diabetes, and high blood pressure? Blood pressure and heart disease High blood pressure causes heart disease and increases the risk of stroke. This is more likely to develop in people who have high blood pressure readings or are overweight. Talk with your health care provider about your target blood pressure readings. Have your blood pressure checked: Every 3-5 years if you are 18-39 years of age. Every year if you are 40 years old or older. If you are between the ages of 65 and 75 and are a current or former smoker, ask your health care provider if you should have a one-time screening for abdominal aortic aneurysm (AAA). Diabetes Have regular diabetes screenings. This checks your fasting blood sugar level. Have the screening done: Once every three years after age 45 if you are at a normal weight and have a low risk for diabetes. More often and at a younger age if you are overweight or have a high risk for diabetes. What should I know about preventing infection? Hepatitis B If you have a higher risk for hepatitis B, you should be screened for this virus. Talk with your health care provider to find out if you are at risk for hepatitis B infection. Hepatitis C Blood testing is recommended for: Everyone born from 1945 through 1965. Anyone with known risk factors for hepatitis C. Sexually transmitted infections (STIs) You should be screened each year for STIs, including gonorrhea and chlamydia, if: You are sexually active and are younger than 81 years of age. You are older than 81 years of age and your   health care provider tells you that you are at risk for this type of infection. Your sexual activity has changed since you were last screened, and you are at increased risk for chlamydia or gonorrhea. Ask your health care provider if you are at risk. Ask your health care provider about whether you are at high risk for HIV. Your health care provider  may recommend a prescription medicine to help prevent HIV infection. If you choose to take medicine to prevent HIV, you should first get tested for HIV. You should then be tested every 3 months for as long as you are taking the medicine. Follow these instructions at home: Alcohol use Do not drink alcohol if your health care provider tells you not to drink. If you drink alcohol: Limit how much you have to 0-2 drinks a day. Know how much alcohol is in your drink. In the [place] one drink equals one 12 oz bottle of beer (355 mL), one 5 oz glass of wine (148 mL), or one 1 oz glass of hard liquor (44 mL). Lifestyle Do not use any products that contain nicotine or tobacco. These products include cigarettes, chewing tobacco, and vaping devices, such as e-cigarettes. If you need help quitting, ask your health care provider. Do not use street drugs. Do not share needles. Ask your health care provider for help if you need support or information about quitting drugs. General instructions Schedule regular health, dental, and eye exams. Stay current with your vaccines. Tell your health care provider if: You often feel depressed. You have ever been abused or do not feel safe at home. Summary Adopting a healthy lifestyle and getting preventive care are important in promoting health and wellness. Follow your health care provider's instructions about healthy diet, exercising, and getting tested or screened for diseases. Follow your health care provider's instructions on monitoring your cholesterol and blood pressure. This information is not intended to replace advice given to you by your health care provider. Make sure you discuss any questions you have with your health care provider. Document Revised: 09/12/2020 Document Reviewed: 09/12/2020 Elsevier Patient Education  2023 [employer] Inc[employer]  

## 2022-09-12 ENCOUNTER — Ambulatory Visit: Payer: Medicare Other

## 2022-09-13 ENCOUNTER — Telehealth: Payer: Self-pay

## 2022-09-13 ENCOUNTER — Other Ambulatory Visit (HOSPITAL_BASED_OUTPATIENT_CLINIC_OR_DEPARTMENT_OTHER): Payer: Self-pay

## 2022-09-13 ENCOUNTER — Other Ambulatory Visit (HOSPITAL_COMMUNITY): Payer: Self-pay

## 2022-09-13 NOTE — Telephone Encounter (Signed)
PA request received via provider for Nuedexta 20-10MG  capsules  PA submitted to [employer] and has been APPROVED from 09/13/2022 to 09/13/2023  Key: [facility]

## 2022-09-14 ENCOUNTER — Other Ambulatory Visit (HOSPITAL_BASED_OUTPATIENT_CLINIC_OR_DEPARTMENT_OTHER): Payer: Self-pay

## 2022-09-17 ENCOUNTER — Encounter: Payer: Self-pay | Admitting: Family Medicine

## 2022-09-17 ENCOUNTER — Telehealth: Payer: Self-pay | Admitting: Neurology

## 2022-09-17 DIAGNOSIS — G20B1 Parkinson's disease with dyskinesia, without mention of fluctuations: Secondary | ICD-10-CM

## 2022-09-17 DIAGNOSIS — R531 Weakness: Secondary | ICD-10-CM

## 2022-09-17 DIAGNOSIS — R2681 Unsteadiness on feet: Secondary | ICD-10-CM

## 2022-09-17 DIAGNOSIS — Z0279 Encounter for issue of other medical certificate: Secondary | ICD-10-CM

## 2022-09-17 NOTE — Telephone Encounter (Signed)
Certainly. Order is in.

## 2022-09-17 NOTE — Telephone Encounter (Signed)
Received FMLA paperwork for the pt's wife to have leave to take care of the patient. Forms are in Dr. Adaline Sill box

## 2022-09-18 ENCOUNTER — Telehealth: Payer: Self-pay | Admitting: Neurology

## 2022-09-18 ENCOUNTER — Other Ambulatory Visit (HOSPITAL_BASED_OUTPATIENT_CLINIC_OR_DEPARTMENT_OTHER): Payer: Self-pay

## 2022-09-18 DIAGNOSIS — R269 Unspecified abnormalities of gait and mobility: Secondary | ICD-10-CM

## 2022-09-18 DIAGNOSIS — R2689 Other abnormalities of gait and mobility: Secondary | ICD-10-CM

## 2022-09-18 DIAGNOSIS — R258 Other abnormal involuntary movements: Secondary | ICD-10-CM

## 2022-09-18 MED ORDER — CARBIDOPA-LEVODOPA 25-100 MG PO TABS
1.0000 | ORAL_TABLET | Freq: Three times a day (TID) | ORAL | 2 refills | Status: DC
Start: 2022-09-18 — End: 2022-11-29
  Filled 2022-09-18 – 2022-09-25 (×6): qty 90, 30d supply, fill #0
  Filled 2022-10-21: qty 90, 30d supply, fill #1
  Filled 2022-11-18: qty 90, 30d supply, fill #2

## 2022-09-18 NOTE — Telephone Encounter (Signed)
Patient is taking sinemet 0.5 tablets TID. He may have noticed a small difference in fluidity of movements. Imbalance has not significantly changed. After our conversation, we agreed to increase Sinemet to 1 tablet TID.  All questions were answered.  Jacquelyne Balint, MD [facility]

## 2022-09-19 ENCOUNTER — Other Ambulatory Visit (HOSPITAL_BASED_OUTPATIENT_CLINIC_OR_DEPARTMENT_OTHER): Payer: Self-pay

## 2022-09-19 ENCOUNTER — Other Ambulatory Visit: Payer: Self-pay

## 2022-09-19 NOTE — Telephone Encounter (Signed)
faxed

## 2022-09-20 ENCOUNTER — Encounter: Payer: Self-pay | Admitting: Neurology

## 2022-09-20 ENCOUNTER — Other Ambulatory Visit (HOSPITAL_BASED_OUTPATIENT_CLINIC_OR_DEPARTMENT_OTHER): Payer: Self-pay

## 2022-09-20 ENCOUNTER — Ambulatory Visit: Payer: Commercial Managed Care - PPO | Admitting: Neurology

## 2022-09-21 ENCOUNTER — Other Ambulatory Visit (HOSPITAL_COMMUNITY): Payer: Self-pay

## 2022-09-24 ENCOUNTER — Other Ambulatory Visit (HOSPITAL_BASED_OUTPATIENT_CLINIC_OR_DEPARTMENT_OTHER): Payer: Self-pay

## 2022-09-25 ENCOUNTER — Other Ambulatory Visit: Payer: Self-pay

## 2022-09-25 ENCOUNTER — Other Ambulatory Visit (HOSPITAL_BASED_OUTPATIENT_CLINIC_OR_DEPARTMENT_OTHER): Payer: Self-pay

## 2022-09-25 ENCOUNTER — Telehealth: Payer: Self-pay

## 2022-09-25 DIAGNOSIS — G20B2 Parkinson's disease with dyskinesia, with fluctuations: Secondary | ICD-10-CM | POA: Diagnosis not present

## 2022-09-25 NOTE — Telephone Encounter (Signed)
Type of forms received: [place] [employer]V Hand[employer] [place]ac[employer]  Routed to: [employer] [place]  Paperwork received by :  Annabelle Harman   Individual made aware of 5-7 business day turn around (Y/N): Y Patient aware Dr. Milinda Cave out of office until 5/28  Form completed and patient made aware of charges(Y/N): N  Form location:  [place] inbox front office

## 2022-09-26 NOTE — Progress Notes (Signed)
NEUROLOGY FOLLOW UP OFFICE NOTE  William Montgomery [id number]  Subjective:  William Montgomery is a 82 y.o. year old right-handed male with a medical history of cervical and lumbar degenerative disc disease s/p ACDF (C6-7 and L5-S1), OA, chronic headaches, CAD, HTN, CKD, HLD, NASH, hearing loss, prostate cancer who we last saw on 09/05/22.  To briefly review: He had a headache on his right side of head/neck, behind the ear that was severe in earlier in 2023. He had a CT head and cervical spine. Head was unremarkable, but cervical spine showed severe cervical spondylosis. He had dry needling and injections in the neck without benefit in pain. He was then diagnosed with occipital neuralgia by PCP. He got an injection with immediate relief (12/2021). The headache came back after a few weeks. When he got another shot, it did not help.   Imbalance started in 02/2022. He saw his PCP who recommended exercise and monitoring. The symptoms continued to progress though. He feels like his balance is getting worse on a daily basis. He is shuffling while walking to get around. He endorses falls, about 3 falls in the last month, working in yard. It is more when leaning over or to the side.   He feels he is getting worse rapidly. He has noticed slurred speech and word finding difficulty a couple of months ago that he feels is getting worse recently. He has difficulty writing, and his signature looks small and not like his signature normally would. He has difficulty getting in and out of chairs. Symptoms gets worse as the day goes on usually. When he gets close to a step, he will find himself stopping, having to think about picking his leg up and having difficulty with this. He is very emotional, including tearing up during the history taking. Wife says this is not like him. He feels more depressed due to symptoms and has a lot of anxiety. He was prescribed propranolol for this but he has never taken it.   Per wife,  patient can have leg jerking when falling asleep or when in a deep sleep. There is no clear REM sleep behavior though. Patient does not feel like he sleeps well though. He has difficulty falling asleep (was taking CBD to help with this). He denies any changes to smell.   He denies ptosis, diplopia, difficulty chewing or swallowing. He denies orthopnea. He feels weaker but denies clear muscle atrophy, cramps, or twitching.   Pseudobulbar affect is present.   The patient does not report symptoms referable to autonomic dysfunction including impaired sweating, heat or cold intolerance, excessive mucosal dryness, gastroparetic early satiety, postprandial abdominal bloating, constipation, bowel or bladder dyscontrol, or syncope/presyncope/orthostatic intolerance.   The patient has not noticed any recent skin rashes nor does he report any constitutional symptoms like fever, night sweats, anorexia or unintentional weight loss.   Patient was advised to go to ED on 09/03/22 by PCP. Patient was admitted for more expedited work up from 09/03/22-09/04/22. 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 Labs were significant for CRP of 9.4, normal ESR, A1c of 5.2, normal TSH, CBC with MCV of 102 (B12 on 08/23/22 was 648). Other labs are pending. MRI brain showed no acute process with chronic small vessel ischemia and volume loss.   EtOH use: Very rare  Restrictive diet? No Family history of neuropathy/myopathy/neurologic disease? Brother with seizure disorder who passed away recently   Patient is most concerned he is getting  dementia, parkinson's disease, or ALS.  Most recent Assessment and Plan (09/05/22): His neurological examination is pertinent for pseudobulbar affect, increased tone and decreased rapid alternating movements of LUE, and hyperreflexia. Primitive reflexes such as facial and midline reflexes are present as well.    Extensive work up has thus far  not shown clear etiology. MRI neuroaxis did not show a structural cause for symptoms. Some lab work is still pending (see above). Overall, patient's symptoms are concerning for a neurodegenerative process (?parkinsonism, CBD).    PLAN: -Blood work: Will follow up on pending labs from hospitalization -DATscan -Will consider LP with possible paraneoplastic panel -Will consider malignancy work up -PT discussed, patient deferred as he is very active -Sinemet trial 1/2 tablet TID -Nuedexta 20mg /10mg  sample for pseudobulbar affect (once daily) - 13 tablets. Patient will let me know if this works for him and wants prescription.  Since their last visit: Pending labs from hospitalization were normal (including vit E, copper, zinc, AChR ab, folate, IFE).   Patient did well on the sample of Neudexta, feeling it helped his mood, so this was prescribed on 09/10/22. This continues to work well.  He was tolerating Sinemet but not sure if any difference had been made. Sinemet was increased to 1 tablet TID on 09/18/22 as he felt there may have been a small improvement in the fluidity of his movements. He is taking Sinemet at 6am, 1pm, and 7 pm. Patient is not sure if the Sinemet is changing any symptoms. He does not notice on or off symptoms. He denies any side effects to Sinemet or Neudexta. Wife thinks it may have helped a little. He continues to freeze, especially when approaching steps. Patient has had a couple of minor falls. One when he was on one knee fixing something and fell over. He also fell over when reaching over to get a package on the ground.  Patient denies any worsening of symptoms since last clinic visit. He denies difficulty swallowing. His speech is similar to prior. He denies significant tremors. He denies changes in sleep or concern for REM sleep disorders.  Patient started PT. He continues to go to the gym 4-5 times per week.  DATScan is scheduled currrently for 10/24/22.  MEDICATIONS:   Outpatient Encounter Medications as of 10/10/2022  Medication Sig   carbidopa-levodopa (SINEMET IR) 25-100 MG tablet Take 1 tablet by mouth 3 (three) times daily.   Dextromethorphan-quiNIDine (NUEDEXTA) 20-10 MG capsule Take 1 capsule by mouth 2 (two) times daily.   [DISCONTINUED] Multiple Vitamins-Minerals (ICAPS AREDS 2 PO) Take 1 capsule by mouth daily. (Patient not taking: Reported on 09/11/2022)   [DISCONTINUED] nitroGLYCERIN (NITROSTAT) 0.4 MG SL tablet Place 1 tablet (0.4 mg total) under the tongue every 5 (five) minutes as needed for chest pain. (Patient not taking: Reported on 09/11/2022)   [DISCONTINUED] propranolol (INDERAL) 10 MG tablet Take 1-2 tablets (10-20 mg total) by mouth 2 (two) times daily as needed for anxiety / panic (Patient not taking: Reported on 09/05/2022)   No facility-administered encounter medications on file as of 10/10/2022.    PAST MEDICAL HISTORY: Past Medical History:  Diagnosis Date   Arthritis    joints/  back/  neck   Chronic headaches    due to cervical spondylosis   Chronic renal insufficiency, stage 2 (mild)    borderlines II/III   Coronary artery disease 1999   cardiologist--- dr Antoine Poche---  (per cardiology note pt refused to take asa/ statin) hx PCI w/ stenting in 1999 to  LAD and 12-23-2007 x2 DES to proximal LAD;  Cath 03/2017-->  non- critical 70 - 80% mid/distal circ stenosis,  chronic small D1 80% stenosis (stable) and DES to proximal and mid LAD--   Med mgmt rec'd-->nitrates caused HAs, ACE-I caused cough.   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 Jarold Motto)   Erectile dysfunction after radical prostatectomy    History of kidney stones    History of prostate cancer 12/2014   urologist-- dr Retta Diones---   s/p Robot assisted radical prostatectomy 07/2015: lymph node involvement//   undetactable PSA since   HTN (hypertension)    ACE-I cough.  Fine on ARB.    Hx of blastomycosis 1978   s/p  right upper lobectomy   Hyperlipidemia    NASH (nonalcoholic steatohepatitis) 06/2011   normal LFTS  /  Hep B and C testing neg.  Mild fatty liver on CT 2020   Nocturia    Parkinson's disease    Pseudobulbar affect    Pulmonary nodules    small, picked up on CT ab/pelv for abd pains/flank pains-->pt quit smoking 40 yrs ago.  Low risk for lung ca so no f/u CT needed.   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   Tinnitus    Wears glasses    Wears hearing aid in both ears     PAST SURGICAL HISTORY: Past Surgical History:  Procedure Laterality Date   ANTERIOR CERVICAL DECOMP/DISCECTOMY FUSION  1982   C6--7   APPENDECTOMY  1953   CARDIAC CATHETERIZATION  02/27/2000   @[facility]  by dr hochrein ---  single vessel w/ preserved LVSF   CORONARY ANGIOPLASTY WITH STENT PLACEMENT  12/23/2007   by Dr End--- PCI w/ DES x2 to  pLAD   CORONARY STENT PLACEMENT  1999   PTCA w/ stenting to LAd   CYSTOSCOPY WITH RETROGRADE PYELOGRAM, URETEROSCOPY AND STENT PLACEMENT Left 05/10/2018   Procedure: CYSTOSCOPY WITH LEFT RETROGRADE PYELOGRAM, LEFT URETEROSCOPY AND LEFT URETERAL STENT PLACEMENT;  Surgeon: Sebastian Ache, MD;  Location: [facility];  Service: Urology;  Laterality: Left;   HOLMIUM LASER APPLICATION Left 05/10/2018   Procedure: HOLMIUM LASER APPLICATION;  Surgeon: Sebastian Ache, MD;  Location: [facility];  Service: Urology;  Laterality: Left;   INCISIONAL HERNIA REPAIR N/A 03/19/2019   Procedure: LAPAROSCOPIC INCISIONAL HERNIA REPAIR WITH MESH, RECURRENT UMBILICAL HERNIA REPAIR;  Surgeon: Karie Soda, MD;  Location: [facility];  Service: General;  Laterality: N/A;   INGUINAL HERNIA REPAIR Right 03/19/2019   Procedure: LAPAROSCOPIC BILATERAL FEMORAL AND INGUINAL HERNIA REPAIR WITH MESH, LYSIS OF ADHESIONS;   Surgeon: Karie Soda, MD;  Location: [facility];  Service: General;  Laterality: Right;   LEFT HEART CATH AND CORONARY ANGIOGRAPHY N/A 03/21/2017   Stents patent; mod circ dz, nothing for intervention--imdur added.  Procedure: LEFT HEART CATH AND CORONARY ANGIOGRAPHY;  Surgeon: Yvonne Kendall, MD;  Location: [facility];  Service: Cardiovascular;  Laterality: N/A;   LUMBAR SPINE SURGERY  1978   L5--S1   LUNG LOBECTOMY Right 1978   right upper lobectomy for blastomycosis   LYMPHADENECTOMY Bilateral 07/14/2015   Procedure: LYMPHADENECTOMY;  Surgeon: Heloise Purpura, MD;  Location: [facility];  Service: Urology;  Laterality:  Bilateral;   ROBOT ASSISTED LAPAROSCOPIC RADICAL PROSTATECTOMY N/A 07/14/2015   One positive pelvic LN.  Procedure: XI ROBOTIC ASSISTED LAPAROSCOPIC RADICAL PROSTATECTOMY LEVEL 2;  Surgeon: Heloise Purpura, MD;  Location: [facility];  Service: Urology;  Laterality: N/A;   ROTATOR CUFF REPAIR  2007   UMBILICAL HERNIA REPAIR  07/14/2015   Procedure: HERNIA REPAIR UMBILICAL ADULT;  Surgeon: Heloise Purpura, MD;  Location: [facility];  Service: Urology;;    ALLERGIES: Allergies  Allergen Reactions   Ace Inhibitors Cough   Imdur [Isosorbide Dinitrate] Other (See Comments)    Headache    FAMILY HISTORY: Family History  Problem Relation Age of Onset   Cervical cancer Mother        deceased   Cirrhosis Father    Colon cancer Neg Hx     SOCIAL HISTORY: Social History   Tobacco Use   Smoking status: Former    Packs/day: 0.50    Years: 20.00    Additional pack years: 0.00    Total pack years: 10.00    Types: Cigarettes    Quit date: 1978    Years since quitting: 46.4   Smokeless tobacco: Former    Types: Chew    Quit date: 2000   Tobacco comments:    Theme park manager Use: Never used  Substance Use Topics   Alcohol use: Yes    Comment: seldom   Drug use: Never   Social History   Social History Narrative   Married, 2 adult children ([place] and [place]).    Lives in [place].     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   Tob (smoke and chew) x 20 yrs, quit approx 1980s.   Alcohol: very rare.   One cup coffee each morning.   Exercise: walks 3 miles about 4 times per week.   Active lifestyle   Two story home   Right handed      Objective:  Vital Signs:  BP (!) 147/72   Pulse 71   Ht 5\' 9"  (1.753 m)   Wt 164 lb 3.2 oz (74.5 kg)   SpO2 95%   BMI 24.25 kg/m   General: General appearance: Awake and alert. No distress. Cooperative with exam.  Skin: No obvious rash or jaundice. HEENT: Atraumatic. Anicteric. Able to produce pain with pressure on occipital nerve on right occiput. Lungs: Non-labored breathing on room air  Extremities: No edema. No obvious deformity.  Musculoskeletal: No obvious joint swelling. Psych: Flat affect.  Neurological: Mental Status: Alert. Speech fluent. No obvious pseudobulbar affect today Cranial Nerves: CNII: No RAPD. Visual fields intact. CNIII, IV, VI: PERRL. No nystagmus. EOMI. CN V: Facial sensation intact bilaterally to fine touch. [place]eter clench strong. Jaw jerk negative. CN VII: Facial muscles symmetric and strong. No ptosis at rest. CN VIII: Hears finger rub well bilaterally. CN IX: No hypophonia. CN X: Palate elevates symmetrically. CN XI: Full strength shoulder shrug bilaterally. CN XII: Tongue protrusion full and midline. No atrophy or fasciculations. Mild dysarthria. Motor: Tone is normal today (do not appreciate significantly increased tone in LUE today). No fasciculations in extremities. No atrophy. Strength is 5/5 in bilateral upper and lower extremities. Reflexes:  Right Left   Bicep 2+ 2+   Tricep 2+ 2+   BrRad 2+ 2+   Knee 2+ 2+   Ankle 2+ 2+    Pathological Reflexes: Babinski: mute response bilaterally Hoffman: absent bilaterally Troemner: absent bilaterally Palmomental: absent Facial: positive bilaterally  Midline tap: positive Sensation: Intact to  light touch in all extremities Coordination: Intact finger-to- nose-finger bilaterally. Gait: Able to rise from chair without assistance, but with mild difficulty. Decreased arm swing (R > L), shuffling steps. En bloc turning. No obvious gait freezing.  Labs and Imaging review: New results: 09/04/22: Vit E, copper, zinc wnl  09/03/22: folate wnl, AChR ab negative  Previously reviewed results: 09/04/22: HIV non-reactive Multiple myeloma panel pending ANA negative CRP: 9.4 ESR: 15 HbA1c: 5.2  09/03/22: Lipid panel unremarkable Aldolase wnl (3.6) CK: 83 TSH: 3.373 UA unremarkable Urine drug screen positive for tetrahydrocannabinol EtOH: negative Ammonia < 10 CMP unremarkable CBC significant for MCV of 102, platelets 120   08/23/22: B12: 648 CMP: Cr 1.20, GFR 57.02 TSH: 2.67 CBC: MCV 104.2   Imaging: MRI brain wo contrast (09/03/22): FINDINGS: Brain: No acute infarct, mass effect or extra-axial collection. No acute or chronic hemorrhage. There is multifocal hyperintense T2-weighted signal within the white matter. Generalized volume loss. The midline structures are normal.   Vascular: Major flow voids are preserved.   Skull and upper cervical spine: Normal calvarium and skull base. Visualized upper cervical spine and soft tissues are normal.   Sinuses/Orbits:No paranasal sinus fluid levels or advanced mucosal thickening. No mastoid or middle ear effusion. Normal orbits.   IMPRESSION: 1. No acute intracranial abnormality. 2. 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   CT head wo contrast (09/03/22): FINDINGS: Brain: There is no acute intracranial hemorrhage, extra-axial fluid collection, or acute infarct   There is background parenchymal volume loss with prominence of the ventricular system and extra-axial CSF spaces. The ventricles are slightly increased in size compared to the head CT from 2023 but without evidence of transependymal flow of CSF  to suggest acute hydrocephalus. Gray-white differentiation is preserved. Mild hypodensity in the supratentorial white matter likely reflects sequela of mild chronic small-vessel ischemic change.   The pituitary and suprasellar region are normal. There is no mass lesion there is no mass effect or midline shift.   Vascular: No hyperdense vessel or unexpected calcification.   Skull: Normal. Negative for fracture or focal lesion.   Sinuses/Orbits: The imaged paranasal sinuses are clear. The imaged globes and orbits are unremarkable.   Other: None.   IMPRESSION: No acute intracranial pathology.   MRI cervical, thoracic, and lumbar spine wo contrast (08/25/22): FINDINGS: MRI CERVICAL SPINE FINDINGS   Alignment: Straightening of the normal cervical lordosis. Trace anterolisthesis of C4 on C5 and C7 on T1.   Vertebrae: No acute fracture, suspicious osseous lesion, or evidence of discitis. Redemonstrated fusion of C6 and C7, favored to be the sequela of prior surgery. Unchanged vertebral body height loss C5.   Cord: Normal signal and morphology.   Posterior Fossa, vertebral arteries, paraspinal tissues: Negative.   Disc levels:   C2-C3: No significant disc bulge. Left-greater-than-right facet arthropathy. No spinal canal stenosis. Mild left neural foraminal narrowing, unchanged.   C3-C4: No significant disc bulge. Left-greater-than-right facet arthropathy and bilateral uncovertebral hypertrophy. No spinal canal stenosis. Severe right and moderate left neural foraminal narrowing, unchanged.   C4-C5: Trace anterolisthesis with disc unroofing. Right-greater-than-left facet and uncovertebral hypertrophy. No spinal canal stenosis. Severe bilateral neural foraminal narrowing, right greater than left, unchanged   C5-C6: Disc height loss and mild disc bulge. Facet and uncovertebral hypertrophy. Mild spinal canal stenosis and moderate to severe bilateral neural foraminal narrowing,  unchanged.   C6-C7: Osseous fusion across the disc space. Facet and uncovertebral hypertrophy. No spinal canal  stenosis. Mild bilateral neural foraminal narrowing, unchanged   C7-T1: Trace anterolisthesis and disc unroofing. Facet and uncovertebral hypertrophy. Mild spinal canal stenosis and mild-to-moderate bilateral neural foraminal narrowing, unchanged.   MRI THORACIC SPINE FINDINGS   Alignment: 3 mm anterolisthesis of T1 on T2. No other listhesis in the thoracic spine. Mild S-shaped curvature of the thoracolumbar spine.   Vertebrae: No acute fracture, suspicious osseous lesion, or evidence of discitis.   Cord:  Normal signal and morphology.   Paraspinal and other soft tissues: No acute finding.   Disc levels:   T1-T2: Trace anterolisthesis with disc unroofing. Facet arthropathy. Mild spinal canal stenosis. Mild right and moderate left neural foraminal narrowing.   No other significant spinal canal stenosis or neural foraminal narrowing in the thoracic spine.   MRI LUMBAR SPINE FINDINGS   Segmentation:  5 lumbar type vertebral bodies.   Alignment: S-shaped curvature of the thoracolumbar spine. Trace retrolisthesis of L3 on L4.   Vertebrae: No acute fracture, suspicious osseous lesion, or evidence of discitis.   Conus medullaris and cauda equina: Conus extends to the L1-L2 level. Conus and cauda equina appear normal.   Paraspinal and other soft tissues: Increased T2 signal between the spinous processes of L3 and L4 (series 3, image 12).   Disc levels:   T12-L1: No significant disc bulge. Small left paracentral annular fissure. No spinal canal stenosis or neural foraminal narrowing.   L1-L2: Mild disc bulge with right foraminal/extreme lateral protrusion. Mild facet arthropathy. No spinal canal stenosis or neural foraminal narrowing.   L2-L3: Mild disc bulge. Mild facet arthropathy. Narrowing of the right lateral recess. No spinal canal stenosis or neural  foraminal narrowing.   L3-L4: Trace retrolisthesis and mild disc bulge, eccentric to the right with right extreme lateral disc protrusion, which may contact the exiting right L3 nerve. Moderate facet arthropathy. Narrowing of the right lateral recess. No spinal canal stenosis. Mild right neural foraminal narrowing.   L4-L5: Mild disc bulge with superimposed right foraminal and extreme lateral protrusion, which may contact the exiting right L4 nerve. Mild facet arthropathy. Narrowing of the lateral recesses. No spinal canal stenosis. Mild right neural foraminal narrowing.   L5-S1: Disc height loss and mild broad-based disc bulge with superimposed left paracentral disc protrusion. Mild facet arthropathy. No spinal canal stenosis. Moderate right and mild left neural foraminal narrowing.   IMPRESSION: CERVICAL SPINE:   1. C5-C6 mild spinal canal stenosis and moderate to severe bilateral neural foraminal narrowing. 2. C7-T1 mild spinal canal stenosis and mild-to-moderate bilateral neural foraminal narrowing. 3. C3-C4 severe right and moderate left neural foraminal narrowing. 4. C4-C5 severe bilateral neural foraminal narrowing.   THORACIC SPINE:   T1-T2 mild spinal canal stenosis, with moderate left and mild right neural foraminal narrowing.   LUMBAR SPINE:   1. L5-S1 moderate right and mild left neural foraminal narrowing. 2. L3-L4 and L4-L5 mild right neural foraminal narrowing. At L3-L4 and L4-L5, right extreme lateral disc protrusion may contact the exiting right L3 and L4 nerves, as well as narrowing of the right lateral recesses, which could affect the descending right L4 and L5 nerve roots, respectively. 3. No spinal canal stenosis. 4. Multilevel facet arthropathy, which can be a cause of back pain. 5. 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  Assessment/Plan:  [name], a 82 y.o. male with  pseudobulbar affect, dysarthria, shuffling gait, imbalance, and falls. Symptoms started around 02/2022 per  patient. The etiology of patient's symptoms is currently unclear, but is concerning for a neurodegenerative condition. He has responded well to Neudexta for his pseudobulbar affect. I was initially concerned for parkinsonism, perhaps CBD. A trial of Sinemet has had unclear affect, though exam may be slightly improved from prior. Patient has upcoming DATScan that may be helpful.  The pain in patient's head is consistent with occipital neuralgia. We discussed block, but patient is not currently worried about these symptoms and would like to defer for now.  Plan: -DATScan as planned on 10/24/22 -Continue Sinemet 1 tablet TID for now -Continue Neudexta 20-10 mg BID -May consider lumbar puncture with paraneoplastic panel and CT chest, abd, pelvis is DATScan is unrevealing  -Continue to stay active  Return to clinic in 3 months  Total time spent reviewing records, interview, history/exam, documentation, and coordination of care on day of encounter:  55 min  Jacquelyne Balint, MD

## 2022-10-05 NOTE — Telephone Encounter (Signed)
Called patient to let him know form was ready for pick up. Patient stated he will come by next week to get.

## 2022-10-06 DEATH — deceased

## 2022-10-09 ENCOUNTER — Other Ambulatory Visit (HOSPITAL_BASED_OUTPATIENT_CLINIC_OR_DEPARTMENT_OTHER): Payer: Self-pay

## 2022-10-10 ENCOUNTER — Encounter: Payer: Self-pay | Admitting: Neurology

## 2022-10-10 ENCOUNTER — Ambulatory Visit (INDEPENDENT_AMBULATORY_CARE_PROVIDER_SITE_OTHER): Payer: Commercial Managed Care - PPO | Admitting: Neurology

## 2022-10-10 VITALS — BP 147/72 | HR 71 | Ht 69.0 in | Wt 164.2 lb

## 2022-10-10 DIAGNOSIS — R251 Tremor, unspecified: Secondary | ICD-10-CM

## 2022-10-10 DIAGNOSIS — F482 Pseudobulbar affect: Secondary | ICD-10-CM | POA: Diagnosis not present

## 2022-10-10 DIAGNOSIS — R2689 Other abnormalities of gait and mobility: Secondary | ICD-10-CM | POA: Diagnosis not present

## 2022-10-10 DIAGNOSIS — R269 Unspecified abnormalities of gait and mobility: Secondary | ICD-10-CM | POA: Diagnosis not present

## 2022-10-10 DIAGNOSIS — R4789 Other speech disturbances: Secondary | ICD-10-CM

## 2022-10-10 DIAGNOSIS — R258 Other abnormal involuntary movements: Secondary | ICD-10-CM | POA: Diagnosis not present

## 2022-10-10 DIAGNOSIS — M5481 Occipital neuralgia: Secondary | ICD-10-CM | POA: Diagnosis not present

## 2022-10-10 NOTE — Patient Instructions (Addendum)
Plan: -DATScan as planned on 10/24/22 -Continue Sinemet 1 tablet three times daily for now -Continue Neudexta 20-10 mg twice daily -Continue to stay active  When I have the results of your DATScan, I will be in touch to discuss next steps.   Return to clinic in 3 months  The physicians and staff at [facility] are committed to providing excellent care. You may receive a survey requesting feedback about your experience at our office. We strive to receive "very good" responses to the survey questions. If you feel that your experience would prevent you from giving the office a "very good " response, please contact our office to try to remedy the situation. We may be reached at [phone]. Thank you for taking the time out of your busy day to complete the survey.  William Balint, MD [facility]  Preventing Falls at [facility] are common, often dreaded events in the lives of older people. Aside from the obvious injuries and even death that may result, fall can cause wide-ranging consequences including loss of independence, mental decline, decreased activity and mobility. Younger people are also at risk of falling, especially those with chronic illnesses and fatigue.  Ways to reduce risk for falling Examine diet and medications. Warm foods and alcohol dilate blood vessels, which can lead to dizziness when standing. Sleep aids, antidepressants and pain medications can also increase the likelihood of a fall.  Get a vision exam. Poor vision, cataracts and glaucoma increase the chances of falling.  Check foot gear. Shoes should fit snugly and have a sturdy, nonskid sole and a broad, low heel  Participate in a physician-approved exercise program to build and maintain muscle strength and improve balance and coordination. Programs that use ankle weights or stretch bands are excellent for muscle-strengthening. Water aerobics programs and low-impact Tai Chi programs have also been shown to  improve balance and coordination.  Increase vitamin D intake. Vitamin D improves muscle strength and increases the amount of calcium the body is able to absorb and deposit in bones.  How to prevent falls from common hazards Floors - Remove all loose wires, cords, and throw rugs. Minimize clutter. Make sure rugs are anchored and smooth. Keep furniture in its usual place.  Chairs -- Use chairs with straight backs, armrests and firm seats. Add firm cushions to existing pieces to add height.  Bathroom - Install grab bars and non-skid tape in the tub or shower. Use a bathtub transfer bench or a shower chair with a back support Use an elevated toilet seat and/or safety rails to assist standing from a low surface. Do not use towel racks or bathroom tissue holders to help you stand.  Lighting - Make sure halls, stairways, and entrances are well-lit. Install a night light in your bathroom or hallway. Make sure there is a light switch at the top and bottom of the staircase. Turn lights on if you get up in the middle of the night. Make sure lamps or light switches are within reach of the bed if you have to get up during the night.  Kitchen - Install non-skid rubber mats near the sink and stove. Clean spills immediately. Store frequently used utensils, pots, pans between waist and eye level. This helps prevent reaching and bending. Sit when getting things out of lower cupboards.  Living room/ Bedrooms - Place furniture with wide spaces in between, giving enough room to move around. Establish a route through the living room that gives you something to hold onto  as you walk.  Stairs - Make sure treads, rails, and rugs are secure. Install a rail on both sides of the stairs. If stairs are a threat, it might be helpful to arrange most of your activities on the lower level to reduce the number of times you must climb the stairs.  Entrances and doorways - Install metal handles on the walls adjacent to the doorknobs  of all doors to make it more secure as you travel through the doorway.  Tips for maintaining balance Keep at least one hand free at all times. Try using a backpack or fanny pack to hold things rather than carrying them in your hands. Never carry objects in both hands when walking as this interferes with keeping your balance.  Attempt to swing both arms from front to back while walking. This might require a conscious effort if Parkinson's disease has diminished your movement. It will, however, help you to maintain balance and posture, and reduce fatigue.  Consciously lift your feet off of the ground when walking. Shuffling and dragging of the feet is a common culprit in losing your balance.  When trying to navigate turns, use a "U" technique of facing forward and making a wide turn, rather than pivoting sharply.  Try to stand with your feet shoulder-length apart. When your feet are close together for any length of time, you increase your risk of losing your balance and falling.  Do one thing at a time. Don't try to walk and accomplish another task, such as reading or looking around. The decrease in your automatic reflexes complicates motor function, so the less distraction, the better.  Do not wear rubber or gripping soled shoes, they might "catch" on the floor and cause tripping.  Move slowly when changing positions. Use deliberate, concentrated movements and, if needed, use a grab bar or walking aid. Count 15 seconds between each movement. For example, when rising from a seated position, wait 15 seconds after standing to begin walking.  If balance is a continuous problem, you might want to consider a walking aid such as a cane, walking stick, or walker. Once you've mastered walking with help, you might be ready to try it on your own again.

## 2022-10-12 DIAGNOSIS — H5203 Hypermetropia, bilateral: Secondary | ICD-10-CM | POA: Diagnosis not present

## 2022-10-12 DIAGNOSIS — H353131 Nonexudative age-related macular degeneration, bilateral, early dry stage: Secondary | ICD-10-CM | POA: Diagnosis not present

## 2022-10-12 DIAGNOSIS — H52223 Regular astigmatism, bilateral: Secondary | ICD-10-CM | POA: Diagnosis not present

## 2022-10-12 DIAGNOSIS — H2513 Age-related nuclear cataract, bilateral: Secondary | ICD-10-CM | POA: Diagnosis not present

## 2022-10-12 DIAGNOSIS — H40013 Open angle with borderline findings, low risk, bilateral: Secondary | ICD-10-CM | POA: Diagnosis not present

## 2022-10-12 DIAGNOSIS — H524 Presbyopia: Secondary | ICD-10-CM | POA: Diagnosis not present

## 2022-10-24 ENCOUNTER — Encounter (HOSPITAL_COMMUNITY)
Admission: RE | Admit: 2022-10-24 | Discharge: 2022-10-24 | Disposition: A | Discharge status: Home / Self Care | Payer: Commercial Managed Care - PPO | Source: Ambulatory Visit | Attending: Neurology | Admitting: Neurology

## 2022-10-24 DIAGNOSIS — R251 Tremor, unspecified: Secondary | ICD-10-CM | POA: Diagnosis not present

## 2022-10-24 DIAGNOSIS — F482 Pseudobulbar affect: Secondary | ICD-10-CM

## 2022-10-24 DIAGNOSIS — M5481 Occipital neuralgia: Secondary | ICD-10-CM | POA: Diagnosis not present

## 2022-10-24 DIAGNOSIS — R2689 Other abnormalities of gait and mobility: Secondary | ICD-10-CM

## 2022-10-24 DIAGNOSIS — R269 Unspecified abnormalities of gait and mobility: Secondary | ICD-10-CM

## 2022-10-24 DIAGNOSIS — R258 Other abnormal involuntary movements: Secondary | ICD-10-CM

## 2022-10-24 DIAGNOSIS — G20C Parkinsonism, unspecified: Secondary | ICD-10-CM | POA: Diagnosis not present

## 2022-10-24 DIAGNOSIS — R4789 Other speech disturbances: Secondary | ICD-10-CM | POA: Diagnosis not present

## 2022-10-24 MED ORDER — POTASSIUM IODIDE (ANTIDOTE) 130 MG PO TABS
130.0000 mg | ORAL_TABLET | Freq: Once | ORAL | Status: AC
  Administered 2022-10-24: 130 mg via ORAL

## 2022-10-24 MED ORDER — IOFLUPANE I 123 185 MBQ/2.5ML IV SOLN
4.2000 | Freq: Once | INTRAVENOUS | Status: AC | PRN
  Administered 2022-10-24: 4.2 via INTRAVENOUS

## 2022-10-24 MED ORDER — POTASSIUM IODIDE (ANTIDOTE) 130 MG PO TABS
ORAL_TABLET | ORAL | Status: AC
  Filled 2022-10-24: qty 1

## 2022-10-29 ENCOUNTER — Encounter: Payer: Self-pay | Admitting: Neurology

## 2022-11-12 ENCOUNTER — Other Ambulatory Visit (HOSPITAL_BASED_OUTPATIENT_CLINIC_OR_DEPARTMENT_OTHER): Payer: Self-pay

## 2022-11-19 ENCOUNTER — Other Ambulatory Visit (HOSPITAL_BASED_OUTPATIENT_CLINIC_OR_DEPARTMENT_OTHER): Payer: Self-pay

## 2022-11-28 ENCOUNTER — Ambulatory Visit (INDEPENDENT_AMBULATORY_CARE_PROVIDER_SITE_OTHER): Payer: Commercial Managed Care - PPO | Admitting: Family Medicine

## 2022-11-28 ENCOUNTER — Encounter: Payer: Self-pay | Admitting: Family Medicine

## 2022-11-28 VITALS — BP 116/73 | HR 54 | Wt 163.0 lb

## 2022-11-28 DIAGNOSIS — M545 Low back pain, unspecified: Secondary | ICD-10-CM | POA: Diagnosis not present

## 2022-11-28 NOTE — Progress Notes (Unsigned)
NEUROLOGY FOLLOW UP OFFICE NOTE  William CARREON [id number]  Subjective:  William Montgomery is a 82 y.o. year old right-handed male with a medical history of cervical and lumbar degenerative disc disease s/p ACDF (C6-7 and L5-S1), OA, chronic headaches, CAD, HTN, CKD, HLD, NASH, hearing loss, prostate cancer who we last saw on 10/10/22.  To briefly review: He had a headache on his right side of head/neck, behind the ear that was severe in earlier in 2023. He had a CT head and cervical spine. Head was unremarkable, but cervical spine showed severe cervical spondylosis. He had dry needling and injections in the neck without benefit in pain. He was then diagnosed with occipital neuralgia by PCP. He got an injection with immediate relief (12/2021). The headache came back after a few weeks. When he got another shot, it did not help.    Imbalance started in 02/2022. He saw his PCP who recommended exercise and monitoring. The symptoms continued to progress though. He feels like his balance is getting worse on a daily basis. He is shuffling while walking to get around. He endorses falls, about 3 falls in the last month, working in yard. It is more when leaning over or to the side.   He feels he is getting worse rapidly. He has noticed slurred speech and word finding difficulty a couple of months ago that he feels is getting worse recently. He has difficulty writing, and his signature looks small and not like his signature normally would. He has difficulty getting in and out of chairs. Symptoms gets worse as the day goes on usually. When he gets close to a step, he will find himself stopping, having to think about picking his leg up and having difficulty with this. He is very emotional, including tearing up during the history taking. Wife says this is not like him. He feels more depressed due to symptoms and has a lot of anxiety. He was prescribed propranolol for this but he has never taken it.   Per wife,  patient can have leg jerking when falling asleep or when in a deep sleep. There is no clear REM sleep behavior though. Patient does not feel like he sleeps well though. He has difficulty falling asleep (was taking CBD to help with this). He denies any changes to smell.   He denies ptosis, diplopia, difficulty chewing or swallowing. He denies orthopnea. He feels weaker but denies clear muscle atrophy, cramps, or twitching.   Pseudobulbar affect is present.   The patient does not report symptoms referable to autonomic dysfunction including impaired sweating, heat or cold intolerance, excessive mucosal dryness, gastroparetic early satiety, postprandial abdominal bloating, constipation, bowel or bladder dyscontrol, or syncope/presyncope/orthostatic intolerance.   The patient has not noticed any recent skin rashes nor does he report any constitutional symptoms like fever, night sweats, anorexia or unintentional weight loss.   Patient was advised to go to ED on 09/03/22 by PCP. Patient was admitted for more expedited work up from 09/03/22-09/04/22. 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 Labs were significant for CRP of 9.4, normal ESR, A1c of 5.2, normal TSH, CBC with MCV of 102 (B12 on 08/23/22 was 648). Other labs are pending. MRI brain showed no acute process with chronic small vessel ischemia and volume loss.   EtOH use: Very rare  Restrictive diet? No Family history of neuropathy/myopathy/neurologic disease? Brother with seizure disorder who passed away recently   Patient is most concerned he is  getting dementia, parkinson's disease, or ALS.  10/10/22: Pending labs from hospitalization were normal (including vit E, copper, zinc, AChR ab, folate, IFE).    Patient did well on the sample of Neudexta, feeling it helped his mood, so this was prescribed on 09/10/22. This continues to work well.   He was tolerating Sinemet but not sure if any  difference had been made. Sinemet was increased to 1 tablet TID on 09/18/22 as he felt there may have been a small improvement in the fluidity of his movements. He is taking Sinemet at 6am, 1pm, and 7 pm. Patient is not sure if the Sinemet is changing any symptoms. He does not notice on or off symptoms. He denies any side effects to Sinemet or Neudexta. Wife thinks it may have helped a little. He continues to freeze, especially when approaching steps. Patient has had a couple of minor falls. One when he was on one knee fixing something and fell over. He also fell over when reaching over to get a package on the ground.   Patient denies any worsening of symptoms since last clinic visit. He denies difficulty swallowing. His speech is similar to prior. He denies significant tremors. He denies changes in sleep or concern for REM sleep disorders.   Patient started PT. He continues to go to the gym 4-5 times per week.  Most recent Assessment and Plan (10/10/22): [name], a 82 y.o. male with pseudobulbar affect, dysarthria, shuffling gait, imbalance, and falls. Symptoms started around 02/2022 per patient. The etiology of patient's symptoms is currently unclear, but is concerning for a neurodegenerative condition. He has responded well to Neudexta for his pseudobulbar affect. I was initially concerned for parkinsonism, perhaps CBD. A trial of Sinemet has had unclear affect, though exam may be slightly improved from prior. Patient has upcoming DATScan that may be helpful.   The pain in patient's head is consistent with occipital neuralgia. We discussed block, but patient is not currently worried about these symptoms and would like to defer for now.   Plan: -DATScan as planned on 10/24/22 -Continue Sinemet 1 tablet TID for now -Continue Neudexta 20-10 mg BID -May consider lumbar puncture with paraneoplastic panel and CT chest, abd, pelvis is DATScan is unrevealing  -Continue to stay active    Since their last visit: DaTSCAN was within normal limits. ***  Fall? Saw PCP on 11/28/22***  MEDICATIONS:  Outpatient Encounter Medications as of 11/29/2022  Medication Sig   carbidopa-levodopa (SINEMET IR) 25-100 MG tablet Take 1 tablet by mouth 3 (three) times daily.   Dextromethorphan-quiNIDine (NUEDEXTA) 20-10 MG capsule Take 1 capsule by mouth 2 (two) times daily.   No facility-administered encounter medications on file as of 11/29/2022.    PAST MEDICAL HISTORY: Past Medical History:  Diagnosis Date   Arthritis    joints/  back/  neck   Chronic headaches    due to cervical spondylosis   Chronic renal insufficiency, stage 2 (mild)    borderlines II/III   Coronary artery disease 1999   cardiologist--- dr Antoine Poche---  (per cardiology note pt refused to take asa/ statin) hx PCI w/ stenting in 1999 to LAD and 12-23-2007 x2 DES to proximal LAD;  Cath 03/2017-->  non- critical 70 - 80% mid/distal circ stenosis,  chronic small D1 80% stenosis (stable) and DES to proximal and mid LAD--   Med mgmt rec'd-->nitrates caused HAs, ACE-I caused cough.   DDD (degenerative disc disease)    lumbar and cervical neck  Diverticulosis of sigmoid colon 07/25/2011   Severe (endoscopy by Dr. Jarold Motto)   Erectile dysfunction after radical prostatectomy    History of kidney stones    History of prostate cancer 12/2014   urologist-- dr Retta Diones---   s/p Robot assisted radical prostatectomy 07/2015: lymph node involvement//   undetactable PSA since   HTN (hypertension)    ACE-I cough.  Fine on ARB.   Hx of blastomycosis 1978   s/p  right upper lobectomy   Hyperlipidemia    NASH (nonalcoholic steatohepatitis) 06/2011   normal LFTS  /  Hep B and C testing neg.  Mild fatty liver on CT 2020   Nocturia    Parkinson's disease    Pseudobulbar affect    Pulmonary nodules    small, picked up on CT ab/pelv for abd pains/flank pains-->pt quit smoking 40 yrs ago.  Low risk for lung ca so no f/u CT needed.    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   Tinnitus    Wears glasses    Wears hearing aid in both ears     PAST SURGICAL HISTORY: Past Surgical History:  Procedure Laterality Date   ANTERIOR CERVICAL DECOMP/DISCECTOMY FUSION  1982   C6--7   APPENDECTOMY  1953   CARDIAC CATHETERIZATION  02/27/2000   @[facility]  by dr hochrein ---  single vessel w/ preserved LVSF   CORONARY ANGIOPLASTY WITH STENT PLACEMENT  12/23/2007   by Dr End--- PCI w/ DES x2 to  pLAD   CORONARY STENT PLACEMENT  1999   PTCA w/ stenting to LAd   CYSTOSCOPY WITH RETROGRADE PYELOGRAM, URETEROSCOPY AND STENT PLACEMENT Left 05/10/2018   Procedure: CYSTOSCOPY WITH LEFT RETROGRADE PYELOGRAM, LEFT URETEROSCOPY AND LEFT URETERAL STENT PLACEMENT;  Surgeon: Sebastian Ache, MD;  Location: [facility];  Service: Urology;  Laterality: Left;   HOLMIUM LASER APPLICATION Left 05/10/2018   Procedure: HOLMIUM LASER APPLICATION;  Surgeon: Sebastian Ache, MD;  Location: [facility];  Service: Urology;  Laterality: Left;   INCISIONAL HERNIA REPAIR N/A 03/19/2019   Procedure: LAPAROSCOPIC INCISIONAL HERNIA REPAIR WITH MESH, RECURRENT UMBILICAL HERNIA REPAIR;  Surgeon: Karie Soda, MD;  Location: [facility];  Service: General;  Laterality: N/A;   INGUINAL HERNIA REPAIR Right 03/19/2019   Procedure: LAPAROSCOPIC BILATERAL FEMORAL AND INGUINAL HERNIA REPAIR WITH MESH, LYSIS OF ADHESIONS;  Surgeon: Karie Soda, MD;  Location: [facility];  Service: General;  Laterality: Right;   LEFT HEART CATH AND CORONARY ANGIOGRAPHY N/A 03/21/2017   Stents patent; mod circ dz, nothing for intervention--imdur added.  Procedure: LEFT HEART CATH AND CORONARY ANGIOGRAPHY;  Surgeon: Yvonne Kendall, MD;  Location: [facility];  Service: Cardiovascular;  Laterality: N/A;   LUMBAR SPINE SURGERY  1978    L5--S1   LUNG LOBECTOMY Right 1978   right upper lobectomy for blastomycosis   LYMPHADENECTOMY Bilateral 07/14/2015   Procedure: LYMPHADENECTOMY;  Surgeon: Heloise Purpura, MD;  Location: [facility];  Service: Urology;  Laterality: Bilateral;   ROBOT ASSISTED LAPAROSCOPIC RADICAL PROSTATECTOMY N/A 07/14/2015   One positive pelvic LN.  Procedure: XI ROBOTIC ASSISTED LAPAROSCOPIC RADICAL PROSTATECTOMY LEVEL 2;  Surgeon: Heloise Purpura, MD;  Location: [facility];  Service: Urology;  Laterality: N/A;   ROTATOR CUFF REPAIR  2007   UMBILICAL HERNIA REPAIR  07/14/2015   Procedure: HERNIA  REPAIR UMBILICAL ADULT;  Surgeon: Heloise Purpura, MD;  Location: [facility];  Service: Urology;;    ALLERGIES: Allergies  Allergen Reactions   Ace Inhibitors Cough   Imdur [Isosorbide Dinitrate] Other (See Comments)    Headache    FAMILY HISTORY: Family History  Problem Relation Age of Onset   Cervical cancer Mother        deceased   Cirrhosis Father    Colon cancer Neg Hx     SOCIAL HISTORY: Social History   Tobacco Use   Smoking status: Former    Current packs/day: 0.00    Average packs/day: 0.5 packs/day for 20.0 years (10.0 ttl pk-yrs)    Types: Cigarettes    Start date: 50    Quit date: 78    Years since quitting: 46.5   Smokeless tobacco: Former    Types: Chew    Quit date: 2000   Tobacco comments:    Scoal  Vaping Use   Vaping status: Never Used  Substance Use Topics   Alcohol use: Yes    Comment: seldom   Drug use: Never   Social History   Social History Narrative   Married, 2 adult children ([place] and [place]).   Lives in [place].     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   Tob (smoke and chew) x 20 yrs, quit approx 1980s.   Alcohol: very rare.   One cup coffee each morning.   Exercise: walks 3 miles about 4 times per week.   Active lifestyle   Two story home   Right handed      Objective:  Vital Signs:  There were no vitals taken for this  visit.  ***  Labs and Imaging review: New results: DaTSCAN (10/24/22): FINDINGS: Symmetric intense uptake within LEFT and RIGHT striata. The heads of the caudate nuclei and the posterior striata (putamen) are normal shape. No evidence of loss of dopamine transport populations in the basal ganglia.   IMPRESSION: Ioflupane scan within normal limits. No reduced radiotracer activity in basal ganglia to suggest Parkinson's syndrome pathology.   Of note, DaTSCAN is not diagnostic of Parkinsonian syndromes, which remains a clinical diagnosis. DaTscan is an adjuvant test to aid in the clinical diagnosis of Parkinsonian syndromes.  Previously reviewed results: 09/04/22: HIV non-reactive Multiple myeloma panel pending ANA negative CRP: 9.4 ESR: 15 HbA1c: 5.2 Vit E, copper, zinc wnl   09/03/22: Lipid panel unremarkable Aldolase wnl (3.6) CK: 83 TSH: 3.373 UA unremarkable Urine drug screen positive for tetrahydrocannabinol EtOH: negative Ammonia < 10 CMP unremarkable CBC significant for MCV of 102, platelets 120 folate wnl, AChR ab negative   08/23/22: B12: 648 CMP: Cr 1.20, GFR 57.02 TSH: 2.67 CBC: MCV 104.2   Imaging: MRI brain wo contrast (09/03/22): FINDINGS: Brain: No acute infarct, mass effect or extra-axial collection. No acute or chronic hemorrhage. There is multifocal hyperintense T2-weighted signal within the white matter. Generalized volume loss. The midline structures are normal.   Vascular: Major flow voids are preserved.   Skull and upper cervical spine: Normal calvarium and skull base. Visualized upper cervical spine and soft tissues are normal.   Sinuses/Orbits:No paranasal sinus fluid levels or advanced mucosal thickening. No mastoid or middle ear effusion. Normal orbits.   IMPRESSION: 1. No acute intracranial abnormality. 2. 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   CT head wo contrast (09/03/22): FINDINGS: Brain: There is no  acute intracranial hemorrhage, extra-axial fluid collection, or acute infarct  There is background parenchymal volume loss with prominence of the ventricular system and extra-axial CSF spaces. The ventricles are slightly increased in size compared to the head CT from 2023 but without evidence of transependymal flow of CSF to suggest acute hydrocephalus. Gray-white differentiation is preserved. Mild hypodensity in the supratentorial white matter likely reflects sequela of mild chronic small-vessel ischemic change.   The pituitary and suprasellar region are normal. There is no mass lesion there is no mass effect or midline shift.   Vascular: No hyperdense vessel or unexpected calcification.   Skull: Normal. Negative for fracture or focal lesion.   Sinuses/Orbits: The imaged paranasal sinuses are clear. The imaged globes and orbits are unremarkable.   Other: None.   IMPRESSION: No acute intracranial pathology.   MRI cervical, thoracic, and lumbar spine wo contrast (08/25/22): FINDINGS: MRI CERVICAL SPINE FINDINGS   Alignment: Straightening of the normal cervical lordosis. Trace anterolisthesis of C4 on C5 and C7 on T1.   Vertebrae: No acute fracture, suspicious osseous lesion, or evidence of discitis. Redemonstrated fusion of C6 and C7, favored to be the sequela of prior surgery. Unchanged vertebral body height loss C5.   Cord: Normal signal and morphology.   Posterior Fossa, vertebral arteries, paraspinal tissues: Negative.   Disc levels:   C2-C3: No significant disc bulge. Left-greater-than-right facet arthropathy. No spinal canal stenosis. Mild left neural foraminal narrowing, unchanged.   C3-C4: No significant disc bulge. Left-greater-than-right facet arthropathy and bilateral uncovertebral hypertrophy. No spinal canal stenosis. Severe right and moderate left neural foraminal narrowing, unchanged.   C4-C5: Trace anterolisthesis with disc  unroofing. Right-greater-than-left facet and uncovertebral hypertrophy. No spinal canal stenosis. Severe bilateral neural foraminal narrowing, right greater than left, unchanged   C5-C6: Disc height loss and mild disc bulge. Facet and uncovertebral hypertrophy. Mild spinal canal stenosis and moderate to severe bilateral neural foraminal narrowing, unchanged.   C6-C7: Osseous fusion across the disc space. Facet and uncovertebral hypertrophy. No spinal canal stenosis. Mild bilateral neural foraminal narrowing, unchanged   C7-T1: Trace anterolisthesis and disc unroofing. Facet and uncovertebral hypertrophy. Mild spinal canal stenosis and mild-to-moderate bilateral neural foraminal narrowing, unchanged.   MRI THORACIC SPINE FINDINGS   Alignment: 3 mm anterolisthesis of T1 on T2. No other listhesis in the thoracic spine. Mild S-shaped curvature of the thoracolumbar spine.   Vertebrae: No acute fracture, suspicious osseous lesion, or evidence of discitis.   Cord:  Normal signal and morphology.   Paraspinal and other soft tissues: No acute finding.   Disc levels:   T1-T2: Trace anterolisthesis with disc unroofing. Facet arthropathy. Mild spinal canal stenosis. Mild right and moderate left neural foraminal narrowing.   No other significant spinal canal stenosis or neural foraminal narrowing in the thoracic spine.   MRI LUMBAR SPINE FINDINGS   Segmentation:  5 lumbar type vertebral bodies.   Alignment: S-shaped curvature of the thoracolumbar spine. Trace retrolisthesis of L3 on L4.   Vertebrae: No acute fracture, suspicious osseous lesion, or evidence of discitis.   Conus medullaris and cauda equina: Conus extends to the L1-L2 level. Conus and cauda equina appear normal.   Paraspinal and other soft tissues: Increased T2 signal between the spinous processes of L3 and L4 (series 3, image 12).   Disc levels:   T12-L1: No significant disc bulge. Small left paracentral  annular fissure. No spinal canal stenosis or neural foraminal narrowing.   L1-L2: Mild disc bulge with right foraminal/extreme lateral protrusion. Mild facet arthropathy. No spinal canal stenosis or neural foraminal narrowing.  L2-L3: Mild disc bulge. Mild facet arthropathy. Narrowing of the right lateral recess. No spinal canal stenosis or neural foraminal narrowing.   L3-L4: Trace retrolisthesis and mild disc bulge, eccentric to the right with right extreme lateral disc protrusion, which may contact the exiting right L3 nerve. Moderate facet arthropathy. Narrowing of the right lateral recess. No spinal canal stenosis. Mild right neural foraminal narrowing.   L4-L5: Mild disc bulge with superimposed right foraminal and extreme lateral protrusion, which may contact the exiting right L4 nerve. Mild facet arthropathy. Narrowing of the lateral recesses. No spinal canal stenosis. Mild right neural foraminal narrowing.   L5-S1: Disc height loss and mild Montgomery-based disc bulge with superimposed left paracentral disc protrusion. Mild facet arthropathy. No spinal canal stenosis. Moderate right and mild left neural foraminal narrowing.   IMPRESSION: CERVICAL SPINE:   1. C5-C6 mild spinal canal stenosis and moderate to severe bilateral neural foraminal narrowing. 2. C7-T1 mild spinal canal stenosis and mild-to-moderate bilateral neural foraminal narrowing. 3. C3-C4 severe right and moderate left neural foraminal narrowing. 4. C4-C5 severe bilateral neural foraminal narrowing.   THORACIC SPINE:   T1-T2 mild spinal canal stenosis, with moderate left and mild right neural foraminal narrowing.   LUMBAR SPINE:   1. L5-S1 moderate right and mild left neural foraminal narrowing. 2. L3-L4 and L4-L5 mild right neural foraminal narrowing. At L3-L4 and L4-L5, right extreme lateral disc protrusion may contact the exiting right L3 and L4 nerves, as well as narrowing of the right lateral  recesses, which could affect the descending right L4 and L5 nerve roots, respectively. 3. No spinal canal stenosis. 4. Multilevel facet arthropathy, which can be a cause of back pain. 5. 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  Assessment/Plan:  [name], a 82 y.o. male with: ***   Plan: ***Continue Sinemet or not? CT C/A/P? LP?  Return to clinic in ***  Total time spent reviewing records, interview, history/exam, documentation, and coordination of care on day of encounter:  *** min  Jacquelyne Balint, MD

## 2022-11-28 NOTE — Progress Notes (Signed)
OFFICE VISIT  11/28/2022  CC:  Chief Complaint  Patient presents with   Back Pain    Thursday or Friday last week, pt lost balance and fell. Left side, lower back. Pt states he has taken tylenol and soma for pain.     Patient is a 82 y.o. male who presents for acute back pain.  HPI: His gait is getting more and more unsteady.  He fell onto his bottom and jammed the low left side of his back last week. It has gotten much better through the week.  He has no pain down the leg, no paresthesias.   Past Medical History:  Diagnosis Date   Arthritis    joints/  back/  neck   Chronic headaches    due to cervical spondylosis   Chronic renal insufficiency, stage 2 (mild)    borderlines II/III   Coronary artery disease 1999   cardiologist--- dr Antoine Poche---  (per cardiology note pt refused to take asa/ statin) hx PCI w/ stenting in 1999 to LAD and 12-23-2007 x2 DES to proximal LAD;  Cath 03/2017-->  non- critical 70 - 80% mid/distal circ stenosis,  chronic small D1 80% stenosis (stable) and DES to proximal and mid LAD--   Med mgmt rec'd-->nitrates caused HAs, ACE-I caused cough.   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 Jarold Motto)   Erectile dysfunction after radical prostatectomy    History of kidney stones    History of prostate cancer 12/2014   urologist-- dr Retta Diones---   s/p Robot assisted radical prostatectomy 07/2015: lymph node involvement//   undetactable PSA since   HTN (hypertension)    ACE-I cough.  Fine on ARB.   Hx of blastomycosis 1978   s/p  right upper lobectomy   Hyperlipidemia    NASH (nonalcoholic steatohepatitis) 06/2011   normal LFTS  /  Hep B and C testing neg.  Mild fatty liver on CT 2020   Nocturia    Parkinson's disease    Pseudobulbar affect    Pulmonary nodules    small, picked up on CT ab/pelv for abd pains/flank pains-->pt quit smoking 40 yrs ago.  Low risk for  lung ca so no f/u CT needed.   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   Tinnitus    Wears glasses    Wears hearing aid in both ears     Past Surgical History:  Procedure Laterality Date   ANTERIOR CERVICAL DECOMP/DISCECTOMY FUSION  1982   C6--7   APPENDECTOMY  1953   CARDIAC CATHETERIZATION  02/27/2000   @[facility]  by dr hochrein ---  single vessel w/ preserved LVSF   CORONARY ANGIOPLASTY WITH STENT PLACEMENT  12/23/2007   by Dr End--- PCI w/ DES x2 to  pLAD   CORONARY STENT PLACEMENT  1999   PTCA w/ stenting to LAd   CYSTOSCOPY WITH RETROGRADE PYELOGRAM, URETEROSCOPY AND STENT PLACEMENT Left 05/10/2018   Procedure: CYSTOSCOPY WITH LEFT RETROGRADE PYELOGRAM, LEFT URETEROSCOPY AND LEFT URETERAL STENT PLACEMENT;  Surgeon: Sebastian Ache, MD;  Location: [facility];  Service: Urology;  Laterality: Left;   HOLMIUM LASER APPLICATION Left 05/10/2018   Procedure: HOLMIUM LASER APPLICATION;  Surgeon: Sebastian Ache, MD;  Location:  [facility];  Service: Urology;  Laterality: Left;   INCISIONAL HERNIA REPAIR N/A 03/19/2019   Procedure: LAPAROSCOPIC INCISIONAL HERNIA REPAIR WITH MESH, RECURRENT UMBILICAL HERNIA REPAIR;  Surgeon: Karie Soda, MD;  Location: [facility];  Service: General;  Laterality: N/A;   INGUINAL HERNIA REPAIR Right 03/19/2019   Procedure: LAPAROSCOPIC BILATERAL FEMORAL AND INGUINAL HERNIA REPAIR WITH MESH, LYSIS OF ADHESIONS;  Surgeon: Karie Soda, MD;  Location: [facility];  Service: General;  Laterality: Right;   LEFT HEART CATH AND CORONARY ANGIOGRAPHY N/A 03/21/2017   Stents patent; mod circ dz, nothing for intervention--imdur added.  Procedure: LEFT HEART CATH AND CORONARY ANGIOGRAPHY;  Surgeon: Yvonne Kendall, MD;  Location: [facility];  Service: Cardiovascular;  Laterality: N/A;   LUMBAR SPINE SURGERY  1978    L5--S1   LUNG LOBECTOMY Right 1978   right upper lobectomy for blastomycosis   LYMPHADENECTOMY Bilateral 07/14/2015   Procedure: LYMPHADENECTOMY;  Surgeon: Heloise Purpura, MD;  Location: [facility];  Service: Urology;  Laterality: Bilateral;   ROBOT ASSISTED LAPAROSCOPIC RADICAL PROSTATECTOMY N/A 07/14/2015   One positive pelvic LN.  Procedure: XI ROBOTIC ASSISTED LAPAROSCOPIC RADICAL PROSTATECTOMY LEVEL 2;  Surgeon: Heloise Purpura, MD;  Location: [facility];  Service: Urology;  Laterality: N/A;   ROTATOR CUFF REPAIR  2007   UMBILICAL HERNIA REPAIR  07/14/2015   Procedure: HERNIA REPAIR UMBILICAL ADULT;  Surgeon: Heloise Purpura, MD;  Location: [facility];  Service: Urology;;    Outpatient Medications Prior to Visit  Medication Sig Dispense Refill   carbidopa-levodopa (SINEMET IR) 25-100 MG tablet Take 1 tablet by mouth 3 (three) times daily. 90 tablet 2   Dextromethorphan-quiNIDine (NUEDEXTA) 20-10 MG capsule Take 1 capsule by mouth 2 (two) times daily. 60 capsule 5   No facility-administered medications prior to visit.    Allergies  Allergen Reactions   Ace Inhibitors Cough   Imdur [Isosorbide Dinitrate] Other (See Comments)    Headache    Review of Systems  As per HPI  PE:    11/28/2022   10:44 AM 10/10/2022   12:49 PM 09/11/2022   11:18 AM  Vitals with BMI  Height  5\' 9"    Weight 163 lbs 164 lbs 3 oz 161 lbs 3 oz  BMI  24.24 23.79  Systolic 116 147 725  Diastolic 73 72 77  Pulse 54 71 60     Physical Exam  Gen: Alert, well appearing.  Patient is oriented to person, place, time, and situation. No tenderness in the low back or the gluteal region.  No hematoma.  LABS:  none  IMPRESSION AND PLAN:  Acute low back strain/contusion. Recovering spontaneously. No new recommendations.  An After Visit Summary was printed and given to the patient.  FOLLOW UP: As needed  Signed:  Santiago Bumpers, MD           11/29/2022

## 2022-11-29 ENCOUNTER — Other Ambulatory Visit (HOSPITAL_BASED_OUTPATIENT_CLINIC_OR_DEPARTMENT_OTHER): Payer: Self-pay

## 2022-11-29 ENCOUNTER — Ambulatory Visit (INDEPENDENT_AMBULATORY_CARE_PROVIDER_SITE_OTHER): Payer: Commercial Managed Care - PPO | Admitting: Neurology

## 2022-11-29 ENCOUNTER — Encounter: Payer: Self-pay | Admitting: Neurology

## 2022-11-29 ENCOUNTER — Encounter: Payer: Self-pay | Admitting: Family Medicine

## 2022-11-29 VITALS — Ht 69.0 in | Wt 163.4 lb

## 2022-11-29 DIAGNOSIS — R251 Tremor, unspecified: Secondary | ICD-10-CM | POA: Diagnosis not present

## 2022-11-29 DIAGNOSIS — R269 Unspecified abnormalities of gait and mobility: Secondary | ICD-10-CM | POA: Diagnosis not present

## 2022-11-29 DIAGNOSIS — M5481 Occipital neuralgia: Secondary | ICD-10-CM

## 2022-11-29 DIAGNOSIS — R296 Repeated falls: Secondary | ICD-10-CM

## 2022-11-29 DIAGNOSIS — F482 Pseudobulbar affect: Secondary | ICD-10-CM

## 2022-11-29 DIAGNOSIS — R2689 Other abnormalities of gait and mobility: Secondary | ICD-10-CM | POA: Diagnosis not present

## 2022-11-29 DIAGNOSIS — R258 Other abnormal involuntary movements: Secondary | ICD-10-CM

## 2022-11-29 DIAGNOSIS — R4789 Other speech disturbances: Secondary | ICD-10-CM | POA: Diagnosis not present

## 2022-11-29 MED ORDER — CARBIDOPA-LEVODOPA 25-100 MG PO TABS
1.0000 | ORAL_TABLET | Freq: Four times a day (QID) | ORAL | 5 refills | Status: DC
Start: 2022-11-29 — End: 2023-01-16
  Filled 2022-11-29 – 2022-12-12 (×5): qty 120, 30d supply, fill #0
  Filled 2023-01-06: qty 120, 30d supply, fill #1

## 2022-11-29 NOTE — Patient Instructions (Addendum)
Increase Sinemet to 4 times daily (6 am, 10 am, 2 pm, 6 pm)  Continue Neudexta 20-10 mg twice daily  We will do a skin biopsy looking for parkinsonism.  Follow up in 3 months. Please let me know if you have any questions or concerns in the meantime.   The physicians and staff at [facility] are committed to providing excellent care. You may receive a survey requesting feedback about your experience at our office. We strive to receive "very good" responses to the survey questions. If you feel that your experience would prevent you from giving the office a "very good " response, please contact our office to try to remedy the situation. We may be reached at [phone]. Thank you for taking the time out of your busy day to complete the survey.  Jacquelyne Balint, MD [facility]  Preventing Falls at [facility] are common, often dreaded events in the lives of older people. Aside from the obvious injuries and even death that may result, fall can cause wide-ranging consequences including loss of independence, mental decline, decreased activity and mobility. Younger people are also at risk of falling, especially those with chronic illnesses and fatigue.  Ways to reduce risk for falling Examine diet and medications. Warm foods and alcohol dilate blood vessels, which can lead to dizziness when standing. Sleep aids, antidepressants and pain medications can also increase the likelihood of a fall.  Get a vision exam. Poor vision, cataracts and glaucoma increase the chances of falling.  Check foot gear. Shoes should fit snugly and have a sturdy, nonskid sole and a broad, low heel  Participate in a physician-approved exercise program to build and maintain muscle strength and improve balance and coordination. Programs that use ankle weights or stretch bands are excellent for muscle-strengthening. Water aerobics programs and low-impact Tai Chi programs have also been shown to improve balance and  coordination.  Increase vitamin D intake. Vitamin D improves muscle strength and increases the amount of calcium the body is able to absorb and deposit in bones.  How to prevent falls from common hazards Floors - Remove all loose wires, cords, and throw rugs. Minimize clutter. Make sure rugs are anchored and smooth. Keep furniture in its usual place.  Chairs -- Use chairs with straight backs, armrests and firm seats. Add firm cushions to existing pieces to add height.  Bathroom - Install grab bars and non-skid tape in the tub or shower. Use a bathtub transfer bench or a shower chair with a back support Use an elevated toilet seat and/or safety rails to assist standing from a low surface. Do not use towel racks or bathroom tissue holders to help you stand.  Lighting - Make sure halls, stairways, and entrances are well-lit. Install a night light in your bathroom or hallway. Make sure there is a light switch at the top and bottom of the staircase. Turn lights on if you get up in the middle of the night. Make sure lamps or light switches are within reach of the bed if you have to get up during the night.  Kitchen - Install non-skid rubber mats near the sink and stove. Clean spills immediately. Store frequently used utensils, pots, pans between waist and eye level. This helps prevent reaching and bending. Sit when getting things out of lower cupboards.  Living room/ Bedrooms - Place furniture with wide spaces in between, giving enough room to move around. Establish a route through the living room that gives you something to hold  onto as you walk.  Stairs - Make sure treads, rails, and rugs are secure. Install a rail on both sides of the stairs. If stairs are a threat, it might be helpful to arrange most of your activities on the lower level to reduce the number of times you must climb the stairs.  Entrances and doorways - Install metal handles on the walls adjacent to the doorknobs of all doors to make  it more secure as you travel through the doorway.  Tips for maintaining balance Keep at least one hand free at all times. Try using a backpack or fanny pack to hold things rather than carrying them in your hands. Never carry objects in both hands when walking as this interferes with keeping your balance.  Attempt to swing both arms from front to back while walking. This might require a conscious effort if Parkinson's disease has diminished your movement. It will, however, help you to maintain balance and posture, and reduce fatigue.  Consciously lift your feet off of the ground when walking. Shuffling and dragging of the feet is a common culprit in losing your balance.  When trying to navigate turns, use a "U" technique of facing forward and making a wide turn, rather than pivoting sharply.  Try to stand with your feet shoulder-length apart. When your feet are close together for any length of time, you increase your risk of losing your balance and falling.  Do one thing at a time. Don't try to walk and accomplish another task, such as reading or looking around. The decrease in your automatic reflexes complicates motor function, so the less distraction, the better.  Do not wear rubber or gripping soled shoes, they might "catch" on the floor and cause tripping.  Move slowly when changing positions. Use deliberate, concentrated movements and, if needed, use a grab bar or walking aid. Count 15 seconds between each movement. For example, when rising from a seated position, wait 15 seconds after standing to begin walking.  If balance is a continuous problem, you might want to consider a walking aid such as a cane, walking stick, or walker. Once you've mastered walking with help, you might be ready to try it on your own again.

## 2022-11-30 ENCOUNTER — Other Ambulatory Visit: Payer: Self-pay

## 2022-11-30 DIAGNOSIS — R202 Paresthesia of skin: Secondary | ICD-10-CM

## 2022-12-03 ENCOUNTER — Encounter: Payer: Self-pay | Admitting: Neurology

## 2022-12-03 ENCOUNTER — Telehealth: Payer: Self-pay | Admitting: Neurology

## 2022-12-03 ENCOUNTER — Ambulatory Visit (INDEPENDENT_AMBULATORY_CARE_PROVIDER_SITE_OTHER): Payer: Commercial Managed Care - PPO | Admitting: Neurology

## 2022-12-03 DIAGNOSIS — R202 Paresthesia of skin: Secondary | ICD-10-CM

## 2022-12-03 DIAGNOSIS — R296 Repeated falls: Secondary | ICD-10-CM

## 2022-12-03 DIAGNOSIS — R2689 Other abnormalities of gait and mobility: Secondary | ICD-10-CM | POA: Diagnosis not present

## 2022-12-03 NOTE — Telephone Encounter (Signed)
Discussed the results of patient's EMG after the procedure today. 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   All questions were answered.  Jacquelyne Balint, MD [facility]

## 2022-12-03 NOTE — Procedures (Addendum)
[facility]  [address] [place], [address]  [place], [place] [address] Tel: [phone] Fax: [phone] Test Date:  12/03/2022  Patient: William Montgomery DOB: January 06, 1941 Physician: Jacquelyne Balint, MD  Sex: Male Height: 5\' 9"  Ref Phys: Jacquelyne Balint, MD  ID#: [id number]   Technician:    History: This is an 82 year old male with imbalance and falls.  NCV & EMG Findings: Extensive electrodiagnostic evaluation of the left upper and lower limbs with thoracic paraspinal muscles shows: Left superficial peroneal/fibular sensory response shows reduced amplitude (2 V). Left sural, median, ulnar, and radial sensory responses are within normal limits. Left peroneal/fibular (EDB) motor response shows reduced amplitude (1.37 mV). Left tibial (AH), median (APB), and ulnar (ADM) motor responses are within normal limits. Left H reflex latency is within normal limits. Chronic motor axon loss changes without active denervation changes are seen in the left rectus femoris, adductor longus, first dorsal interosseous, and extensor indicis proprius muscles.  Impression: This is an abnormal study. 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 No electrodiagnostic evidence of a disorder of anterior horn cells (ie: motor neuron disease) at this time. No electrodiagnostic evidence of a large fiber sensorimotor neuropathy. The low amplitude left superficial peroneal/fibular sensory response may be due to age or technical factors. The low amplitude peroneal/fibular (EDB) motor response may also be age related or technical. There is no evidence of a peroneal/fibular neuropathy on needle examination.    ___________________________ Jacquelyne Balint, MD    Nerve Conduction Studies Motor Nerve Results    Latency Amplitude F-Lat Segment Distance CV Comment  Site (ms) Norm (mV) Norm (ms)  (cm) (m/s) Norm   Left Fibular (EDB) Motor  Ankle 3.7  < 6.0 *1.37  > 2.5        Bel fib head 10.0 - 1.35 -  Bel fib head-Ankle 30 48  > 40   Pop fossa 12.0 - 1.11 -  Pop fossa-Bel fib head 9 45 -   Left Fibular (TA) Motor  Fib head 1.88  < 4.5 6.3  > 3.0        Pop fossa 3.7  < 6.7 5.7 -  Pop fossa-Fib head 9 49  > 40   Left Median (APB) Motor  Wrist 1.88  < 4.0 6.8  > 5.0        Elbow 7.7 - 5.6 -  Elbow-Wrist 31 53  > 50   Left Tibial (AH) Motor  Ankle 3.3  < 6.0 11.7  > 4.0        Knee 10.9 - 8.0 -  Knee-Ankle 38 50  > 40   Left Ulnar (ADM) Motor  Wrist 1.83  < 3.1 13.5  > 7.0        Bel elbow 6.0 - 10.5 -  Bel elbow-Wrist 23 55  > 50   Ab elbow 8.0 - 10.0 -  Ab elbow-Bel elbow 10 50 -    Sensory Sites    Neg Peak Lat Amplitude (O-P) Segment Distance Velocity Comment  Site (ms) Norm (V) Norm  (cm) (ms)   Left Median Sensory  Wrist-Dig II 2.8  < 3.8 23  > 10 Wrist-Dig II 13    Left Radial Sensory  Forearm-Wrist  2.0  < 2.8 18  > 10 Forearm-Wrist 10    Left Superficial Fibular Sensory  14 cm-Ankle 2.3  < 4.6 *2  > 3 14 cm-Ankle 14    Left Sural Sensory  Calf-Lat mall 3.4  < 4.6 6  > 3 Calf-Lat mall 14    Left Ulnar Sensory  Wrist-Dig V 2.6  < 3.2 13  > 5 Wrist-Dig V 11     H-Reflex Results    M-Lat H Lat H Neg Amp H-M Lat  Site (ms) (ms) Norm (mV) (ms)  Left Tibial H-Reflex  Pop fossa 6.3 32.0  < 35.0 1.34 25.7   Electromyography   Side Muscle Ins.Act Fibs Fasc Recrt Amp Dur Poly Activation Comment  Left Tib ant Nml Nml Nml Nml Nml Nml Nml Nml N/A  Left Gastroc MH Nml Nml Nml Nml Nml Nml Nml Nml N/A  Left Rectus fem Nml Nml Nml *1- *1+ *1+ *1+ Nml N/A  Left Add longus Nml Nml Nml *1- *1+ *1+ *1+ Nml N/A  Left Gluteus med Nml Nml Nml Nml Nml Nml Nml Nml N/A  Left T10 PSP Nml Nml Nml Nml Nml Nml Nml Nml N/A  Left T5  PSP Nml Nml Nml Nml Nml Nml Nml Nml N/A  Left FDI Nml Nml Nml *1- *1+ *1+ *1+ Nml N/A  Left EIP Nml Nml Nml *2- *1+ *1+ *1+ Nml N/A  Left Pronator teres Nml Nml Nml Nml Nml Nml Nml Nml N/A  Left Biceps Nml Nml Nml Nml Nml Nml Nml Nml N/A  Left Triceps lat hd Nml Nml Nml Nml Nml Nml Nml Nml N/A  Left Deltoid Nml Nml Nml Nml Nml Nml Nml Nml N/A      Waveforms:  Motor             Sensory             H-Reflex

## 2022-12-04 ENCOUNTER — Telehealth: Payer: Self-pay

## 2022-12-04 NOTE — Telephone Encounter (Signed)
Pt stopped by with a form requesting a prescription to have a stairlift installed at his home. He will need prescription faxed to [phone]. Form placed up front.

## 2022-12-05 NOTE — Telephone Encounter (Signed)
Rx completed

## 2022-12-10 ENCOUNTER — Other Ambulatory Visit: Payer: Self-pay

## 2022-12-10 ENCOUNTER — Other Ambulatory Visit (HOSPITAL_BASED_OUTPATIENT_CLINIC_OR_DEPARTMENT_OTHER): Payer: Self-pay

## 2022-12-12 ENCOUNTER — Other Ambulatory Visit: Payer: Self-pay

## 2022-12-12 ENCOUNTER — Other Ambulatory Visit (HOSPITAL_BASED_OUTPATIENT_CLINIC_OR_DEPARTMENT_OTHER): Payer: Self-pay

## 2022-12-14 ENCOUNTER — Emergency Department (HOSPITAL_COMMUNITY): Payer: Commercial Managed Care - PPO

## 2022-12-14 ENCOUNTER — Emergency Department (HOSPITAL_COMMUNITY)
Admission: EM | Admit: 2022-12-14 | Discharge: 2022-12-14 | Disposition: A | Discharge status: Home / Self Care | Payer: Commercial Managed Care - PPO | Attending: Emergency Medicine | Admitting: Emergency Medicine

## 2022-12-14 ENCOUNTER — Other Ambulatory Visit: Payer: Self-pay

## 2022-12-14 ENCOUNTER — Encounter (HOSPITAL_COMMUNITY): Payer: Self-pay

## 2022-12-14 DIAGNOSIS — I10 Essential (primary) hypertension: Secondary | ICD-10-CM | POA: Insufficient documentation

## 2022-12-14 DIAGNOSIS — S0001XA Abrasion of scalp, initial encounter: Secondary | ICD-10-CM | POA: Diagnosis not present

## 2022-12-14 DIAGNOSIS — W19XXXA Unspecified fall, initial encounter: Secondary | ICD-10-CM

## 2022-12-14 DIAGNOSIS — Z043 Encounter for examination and observation following other accident: Secondary | ICD-10-CM | POA: Diagnosis not present

## 2022-12-14 DIAGNOSIS — W06XXXA Fall from bed, initial encounter: Secondary | ICD-10-CM | POA: Insufficient documentation

## 2022-12-14 DIAGNOSIS — R519 Headache, unspecified: Secondary | ICD-10-CM | POA: Diagnosis not present

## 2022-12-14 DIAGNOSIS — S06320A Contusion and laceration of left cerebrum without loss of consciousness, initial encounter: Secondary | ICD-10-CM | POA: Diagnosis not present

## 2022-12-14 NOTE — ED Triage Notes (Signed)
Patient brought in POV by wife after a mechanical fall this morning. Patient fell out of bed and hit his head on the nightstand. Patient fell to hands and knees. Patient has increased falls in since May. Currently seeing neurology to rule out Parkinson's v. Atypical Parkinson's. Patient has abrasions and hematoma to left side of head. Denies blood thinners, no loss of consciousness.

## 2022-12-14 NOTE — ED Notes (Signed)
Patient discharged by this RN, discharge instructions reviewed, as well as follow up appointments. Patient placed in wheelchair by this RN and taken to get in vehicle by wife. No apparent distress.

## 2022-12-14 NOTE — ED Notes (Signed)
Pt ambulated in the hallway at this time using a cane. Pt had a hard time getting up from laying to sitting position. Once pt was standing, pt was able to walk independently using a cane. Observed with steady gait once pt started walking. MD notified. Will continue to monitor.

## 2022-12-14 NOTE — Discharge Instructions (Signed)
I recommend you follow-up with neurology on Tuesday as scheduled and call your primary care doctor to schedule close follow-up.  If you develop any new or worsening symptoms you can return to the ED.

## 2022-12-14 NOTE — ED Provider Notes (Signed)
[place] EMERGENCY DEPARTMENT AT [facility] Provider Note   CSN: [id number] Arrival date & time: 12/14/22  7829     History  Chief Complaint  Patient presents with   Fabiola Mckethan is a 82 y.o. male.   Fall  82 year old male history of hypertension, hyperlipidemia, possible Parkinson's versus ALS presenting for fall.  Patient was getting out of bed today when he slipped out of bed and hit the left side of his head.  No loss of consciousness or dizziness or presyncope.  He has mild pain to left side of his head.  No neck pain, chest pain, back pain, abdominal pain or pain in his extremities.  He had a couple falls yesterday after he was trying to reach for something and then fell.  No injuries yesterday.  He is not on anticoagulation.  He reports last tetanus shot within the last 5 years.  He is otherwise been at his baseline health.  He is currently working with neurology to workup his recent difficulties with gait and balance.  He has been worked up for [employer] versus atypical ALS.     Home Medications Prior to Admission medications   Medication Sig Start Date End Date Taking? Authorizing Provider  carbidopa-levodopa (SINEMET IR) 25-100 MG tablet Take 1 tablet by mouth 4 (four) times daily. 11/29/22  Yes Antony Madura, MD  Dextromethorphan-quiNIDine (NUEDEXTA) 20-10 MG capsule Take 1 capsule by mouth 2 (two) times daily. 09/10/22  Yes Antony Madura, MD      Allergies    Ace inhibitors and Imdur [isosorbide dinitrate]    Review of Systems   Review of Systems Review of systems completed and notable as per HPI.  ROS otherwise negative.   Physical Exam Updated Vital Signs BP 127/83   Pulse (!) 52   Temp 97.8 F (36.6 C) (Oral)   Resp 17   Ht 5\' 9"  (1.753 m)   Wt 70.3 kg   SpO2 95%   BMI 22.89 kg/m  Physical Exam Vitals and nursing note reviewed.  Constitutional:      General: He is not in acute distress.    Appearance: He is well-developed.   HENT:     Head: Normocephalic.     Comments: Superficial abrasion to left scalp.  No laceration.    Nose: Nose normal.     Mouth/Throat:     Mouth: Mucous membranes are moist.     Pharynx: Oropharynx is clear.  Eyes:     Extraocular Movements: Extraocular movements intact.     Conjunctiva/sclera: Conjunctivae normal.     Pupils: Pupils are equal, round, and reactive to light.  Cardiovascular:     Rate and Rhythm: Normal rate and regular rhythm.     Heart sounds: No murmur heard. Pulmonary:     Effort: Pulmonary effort is normal. No respiratory distress.     Breath sounds: Normal breath sounds.  Abdominal:     Palpations: Abdomen is soft.     Tenderness: There is no abdominal tenderness.  Musculoskeletal:        General: No swelling, tenderness or signs of injury. Normal range of motion.     Cervical back: Neck supple. No tenderness.     Right lower leg: No edema.     Left lower leg: No edema.  Skin:    General: Skin is warm and dry.     Capillary Refill: Capillary refill takes less than 2 seconds.  Neurological:  General: No focal deficit present.     Mental Status: He is alert and oriented to person, place, and time. Mental status is at baseline.     Cranial Nerves: No cranial nerve deficit.     Sensory: No sensory deficit.     Motor: No weakness.  Psychiatric:        Mood and Affect: Mood normal.     ED Results / Procedures / Treatments   Labs (all labs ordered are listed, but only abnormal results are displayed) Labs Reviewed - No data to display  EKG None  Radiology CT Cervical Spine Wo Contrast  Result Date: 12/14/2022 CLINICAL DATA:  Fall. EXAM: CT HEAD WITHOUT CONTRAST CT CERVICAL SPINE WITHOUT CONTRAST TECHNIQUE: Multidetector CT imaging of the head and cervical spine was performed following the standard protocol without intravenous contrast. Multiplanar CT image reconstructions of the cervical spine were also generated. RADIATION DOSE REDUCTION: This  exam was performed according to the departmental dose-optimization program which includes automated exposure control, adjustment of the mA and/or kV according to patient size and/or use of iterative reconstruction technique. COMPARISON:  Head CT and brain MRI 09/03/2022. MRI cervical spine 08/25/2022. FINDINGS: CT HEAD FINDINGS Brain: No acute intracranial hemorrhage. Gray-white differentiation is preserved. No hydrocephalus or extra-axial collection. No mass effect or midline shift. Vascular: No hyperdense vessel or unexpected calcification. Skull: No calvarial fracture or suspicious bone lesion. Skull base is unremarkable. Sinuses/Orbits: No acute finding. Other: Small left temporal scalp contusion. CT CERVICAL SPINE FINDINGS Alignment: Trace degenerative anterolisthesis of C4 on C5 and C7 on T1. No traumatic malalignment. Skull base and vertebrae: No acute fracture. Normal craniocervical junction. No suspicious bone lesions. Acquired fusion of the C6-7 level. Soft tissues and spinal canal: No prevertebral fluid or swelling. No visible canal hematoma. Disc levels: Multilevel cervical spondylosis, worst at C5-6, where there is mild spinal canal stenosis. Upper chest: Prior resection of the left first rib. Other: None. IMPRESSION: 1. No acute intracranial abnormality. Small left temporal scalp contusion. 2. No acute cervical spine fracture or traumatic malalignment. Electronically Signed   By: Orvan Falconer M.D.   On: 12/14/2022 09:26   CT Head Wo Contrast  Result Date: 12/14/2022 CLINICAL DATA:  Fall. EXAM: CT HEAD WITHOUT CONTRAST CT CERVICAL SPINE WITHOUT CONTRAST TECHNIQUE: Multidetector CT imaging of the head and cervical spine was performed following the standard protocol without intravenous contrast. Multiplanar CT image reconstructions of the cervical spine were also generated. RADIATION DOSE REDUCTION: This exam was performed according to the departmental dose-optimization program which includes  automated exposure control, adjustment of the mA and/or kV according to patient size and/or use of iterative reconstruction technique. COMPARISON:  Head CT and brain MRI 09/03/2022. MRI cervical spine 08/25/2022. FINDINGS: CT HEAD FINDINGS Brain: No acute intracranial hemorrhage. Gray-white differentiation is preserved. No hydrocephalus or extra-axial collection. No mass effect or midline shift. Vascular: No hyperdense vessel or unexpected calcification. Skull: No calvarial fracture or suspicious bone lesion. Skull base is unremarkable. Sinuses/Orbits: No acute finding. Other: Small left temporal scalp contusion. CT CERVICAL SPINE FINDINGS Alignment: Trace degenerative anterolisthesis of C4 on C5 and C7 on T1. No traumatic malalignment. Skull base and vertebrae: No acute fracture. Normal craniocervical junction. No suspicious bone lesions. Acquired fusion of the C6-7 level. Soft tissues and spinal canal: No prevertebral fluid or swelling. No visible canal hematoma. Disc levels: Multilevel cervical spondylosis, worst at C5-6, where there is mild spinal canal stenosis. Upper chest: Prior resection of the left first rib. Other: None.  IMPRESSION: 1. No acute intracranial abnormality. Small left temporal scalp contusion. 2. No acute cervical spine fracture or traumatic malalignment. Electronically Signed   By: Orvan Falconer M.D.   On: 12/14/2022 09:26    Procedures Procedures    Medications Ordered in ED Medications - No data to display  ED Course/ Medical Decision Making/ A&P                                 Medical Decision Making Amount and/or Complexity of Data Reviewed Radiology: ordered.   Medical Decision Making:   KENY ARMAND is a 82 y.o. male who presented to the ED today with mechanical fall out of bed.  Last signs reviewed.  Exam he is well-appearing.  Fall was mechanical, he has frequent falls at home secondary to his neurodegenerative condition which has been worked up for ALS versus  Parkinson's.  He had no presyncope or syncope, I do not think labs are warranted at this time.  Will change CT head and cervical spine given head injury, he is up-to-date on tetanus.  No other injuries on exam.   Patient placed on continuous vitals and telemetry monitoring while in ED which was reviewed periodically.  Reviewed and confirmed nursing documentation for past medical history, family history, social history.  Initial Study Results:    Radiology:  All images reviewed independently.  Agree with radiology report at this time.    Reassessment and Plan:   On reassessment patient remained stable.  CT head and cervical spine without acute injury.  He was able to ambulate with his cane without assistance.  His gait issues are chronic low progressive.  He has neurology follow-up scheduled on Tuesday.  I encouraged him to follow-up closely with his primary care doctor as well.  He was discharged home with his wife, strict return precautions given.   Patient's presentation is most consistent with acute complicated illness / injury requiring diagnostic workup.           Final Clinical Impression(s) / ED Diagnoses Final diagnoses:  Fall, initial encounter    Rx / DC Orders ED Discharge Orders     None         Laurence Spates, MD 12/14/22 1026

## 2022-12-18 ENCOUNTER — Ambulatory Visit (INDEPENDENT_AMBULATORY_CARE_PROVIDER_SITE_OTHER): Payer: Commercial Managed Care - PPO | Admitting: Neurology

## 2022-12-18 DIAGNOSIS — F482 Pseudobulbar affect: Secondary | ICD-10-CM

## 2022-12-18 DIAGNOSIS — R269 Unspecified abnormalities of gait and mobility: Secondary | ICD-10-CM

## 2022-12-18 DIAGNOSIS — R2689 Other abnormalities of gait and mobility: Secondary | ICD-10-CM | POA: Diagnosis not present

## 2022-12-18 DIAGNOSIS — R296 Repeated falls: Secondary | ICD-10-CM | POA: Diagnosis not present

## 2022-12-18 DIAGNOSIS — R258 Other abnormal involuntary movements: Secondary | ICD-10-CM | POA: Diagnosis not present

## 2022-12-18 NOTE — Progress Notes (Signed)
Punch Biopsy Procedure Note  Preprocedure Diagnosis: Imbalance; falls   Postprocedure Diagnosis: same  Locations: Site 1: right lateral distal leg;  Site 2: right lateral knee;  Site 3: right shoulder  Indications: r/o synucleinopathy  Anesthesia: 5 mL Lidocaine 1% with epinephrine  Procedure Details Patient informed of the risks (including but not limited to bleeding, pain, infection, scar and infection) and benefits of the procedure.  Informed consent obtained.  The areas which were chosen for biopsy, as above, and surrounding areas were given a sterile prep using alcohol and iodine. The skin was then stretched perpendicular to the skin tension lines and sample removed using the 3 mm punch. Pressure applied, hemostasis achieved.   Dressing applied. The specimen(s) was sent for pathologic examination. The patient tolerated the procedure well.  Estimated Blood Loss: 1 ml  Condition: Stable  Complications: none.  Plan: 1. Instructed to keep the wound dry and covered for 24h and clean thereafter. 2. Warning signs of infection were reviewed.    Jacquelyne Balint, MD [facility] Neurology

## 2022-12-31 ENCOUNTER — Encounter: Payer: Self-pay | Admitting: Neurology

## 2022-12-31 ENCOUNTER — Telehealth: Payer: Self-pay

## 2023-01-02 ENCOUNTER — Ambulatory Visit: Payer: Commercial Managed Care - PPO | Admitting: Neurology

## 2023-01-04 DIAGNOSIS — R269 Unspecified abnormalities of gait and mobility: Secondary | ICD-10-CM | POA: Diagnosis not present

## 2023-01-09 ENCOUNTER — Telehealth: Payer: Self-pay | Admitting: Neurology

## 2023-01-09 NOTE — Telephone Encounter (Signed)
Called patient to discuss results of skin biopsy. There was no evidence of phosphorylated alpha synuclein or amyloid. Nerve fiber density in distal lower limb was noted to be low (as with small fiber neuropathy). This would not well explain patient's symptoms though.  Patient has stopped Sinemet and thinks maybe freezing is better since stopping. His symptoms are stable. No better or worse. We agree to continue to monitor symptoms for now as he seems to be stable currently. He will follow up in 02/2023 as planned and call with any new or worsening symptoms in the meantime.  All questions were answered.  Jacquelyne Balint, MD [facility] Neurology

## 2023-01-10 ENCOUNTER — Encounter: Payer: Self-pay | Admitting: Family Medicine

## 2023-01-11 ENCOUNTER — Telehealth: Payer: Self-pay

## 2023-01-11 ENCOUNTER — Other Ambulatory Visit: Payer: Self-pay

## 2023-01-11 ENCOUNTER — Encounter: Payer: Self-pay | Admitting: Neurology

## 2023-01-11 DIAGNOSIS — R258 Other abnormal involuntary movements: Secondary | ICD-10-CM

## 2023-01-11 DIAGNOSIS — R269 Unspecified abnormalities of gait and mobility: Secondary | ICD-10-CM

## 2023-01-11 DIAGNOSIS — R296 Repeated falls: Secondary | ICD-10-CM

## 2023-01-11 DIAGNOSIS — R202 Paresthesia of skin: Secondary | ICD-10-CM

## 2023-01-11 DIAGNOSIS — M5481 Occipital neuralgia: Secondary | ICD-10-CM

## 2023-01-11 DIAGNOSIS — R4789 Other speech disturbances: Secondary | ICD-10-CM

## 2023-01-11 DIAGNOSIS — F482 Pseudobulbar affect: Secondary | ICD-10-CM

## 2023-01-11 DIAGNOSIS — R251 Tremor, unspecified: Secondary | ICD-10-CM

## 2023-01-11 DIAGNOSIS — R2689 Other abnormalities of gait and mobility: Secondary | ICD-10-CM

## 2023-01-11 NOTE — Telephone Encounter (Signed)
-----   Message from Antony Madura sent at 01/11/2023 12:53 PM EDT ----- Regarding: Orders Can we put in some additional orders for Mr. Westmeyer?  I need one serum send out test to [facility] clinic: test code is MDS2. I would write in the comments: "Red top, 4 mL; refrigerate; send out to [facility] clinic, test code MDS2". I will ask around and make sure we send patient to a lab that can do this for [place].  I also want a lumbar puncture with cell count, protein, glucose, culture, fungal culture, IgG index, HSV, VZV, cytology, and oligoclonal bands.  Thank you,  Riki Rusk

## 2023-01-11 NOTE — Telephone Encounter (Signed)
Working on this one.  

## 2023-01-15 NOTE — Patient Instructions (Signed)

## 2023-01-16 ENCOUNTER — Other Ambulatory Visit: Payer: Medicare Other

## 2023-01-16 ENCOUNTER — Ambulatory Visit: Payer: Commercial Managed Care - PPO | Admitting: Neurology

## 2023-01-16 ENCOUNTER — Ambulatory Visit (INDEPENDENT_AMBULATORY_CARE_PROVIDER_SITE_OTHER): Payer: Commercial Managed Care - PPO | Admitting: Family Medicine

## 2023-01-16 ENCOUNTER — Other Ambulatory Visit (HOSPITAL_BASED_OUTPATIENT_CLINIC_OR_DEPARTMENT_OTHER): Payer: Self-pay

## 2023-01-16 ENCOUNTER — Encounter: Payer: Self-pay | Admitting: Family Medicine

## 2023-01-16 VITALS — BP 118/60 | HR 63 | Temp 97.9°F | Wt 162.8 lb

## 2023-01-16 DIAGNOSIS — R2689 Other abnormalities of gait and mobility: Secondary | ICD-10-CM

## 2023-01-16 DIAGNOSIS — Z23 Encounter for immunization: Secondary | ICD-10-CM

## 2023-01-16 DIAGNOSIS — R258 Other abnormal involuntary movements: Secondary | ICD-10-CM | POA: Diagnosis not present

## 2023-01-16 DIAGNOSIS — Z9181 History of falling: Secondary | ICD-10-CM | POA: Diagnosis not present

## 2023-01-16 DIAGNOSIS — R269 Unspecified abnormalities of gait and mobility: Secondary | ICD-10-CM

## 2023-01-16 MED ORDER — TETANUS-DIPHTH-ACELL PERTUSSIS 5-2.5-18.5 LF-MCG/0.5 IM SUSP
0.5000 mL | Freq: Once | INTRAMUSCULAR | 0 refills | Status: AC
  Filled 2023-01-16: qty 0.5, 1d supply, fill #0

## 2023-01-16 NOTE — Progress Notes (Signed)
OFFICE VISIT  01/16/2023  CC:  Chief Complaint  Patient presents with   Follow-up    Follow up on [facility]. He wants to see if he is due for a tdap vaccine.    Patient is a 82 y.o. male who presents accompanied by his wife for f/u recurrent falls, progressive neurologic decline.  INTERIM HX: Continues to have generalized weakness, poor balance, unsteady gait, recurrent falls. Has been undergoing extensive workup with neurologist but so far diagnosis not known.  Neuro: sinemet did not help.  Continues to have falls. 12/18/22 Punch biopsy by Dr. Loleta Chance  r/o synucleinopathy  (right lateral distal leg, right lateral knee, right shoulder)-->result NEG. He is scheduled to get a lumbar puncture on 01/30/2023. DAT scan neg 10/24/22  Past Medical History:  Diagnosis Date   Arthritis    joints/  back/  neck   Chronic headaches    due to cervical spondylosis   Chronic renal insufficiency, stage 2 (mild)    borderlines II/III   Coronary artery disease 1999   cardiologist--- dr Antoine Poche---  (per cardiology note pt refused to take asa/ statin) hx PCI w/ stenting in 1999 to LAD and 12-23-2007 x2 DES to proximal LAD;  Cath 03/2017-->  non- critical 70 - 80% mid/distal circ stenosis,  chronic small D1 80% stenosis (stable) and DES to proximal and mid LAD--   Med mgmt rec'd-->nitrates caused HAs, ACE-I caused cough.   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 Jarold Motto)   Erectile dysfunction after radical prostatectomy    History of kidney stones    History of prostate cancer 12/2014   urologist-- dr Retta Diones---   s/p Robot assisted radical prostatectomy 07/2015: lymph node involvement//   undetactable PSA since   HTN (hypertension)    ACE-I cough.  Fine on ARB.   Hx of blastomycosis 1978   s/p  right upper lobectomy   Hyperlipidemia    NASH (nonalcoholic steatohepatitis) 06/2011   normal LFTS  /  Hep B and C  testing neg.  Mild fatty liver on CT 2020   Nocturia    Parkinsonism    2023/24, unknown etiology (Dat scan neg)   Pseudobulbar affect    Pulmonary nodules    small, picked up on CT ab/pelv for abd pains/flank pains-->pt quit smoking 40 yrs ago.  Low risk for lung ca so no f/u CT needed.   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   Tinnitus    Wears glasses    Wears hearing aid in both ears     Past Surgical History:  Procedure Laterality Date   ANTERIOR CERVICAL DECOMP/DISCECTOMY FUSION  1982   C6--7   APPENDECTOMY  1953   CARDIAC CATHETERIZATION  02/27/2000   @[facility]  by dr hochrein ---  single vessel w/ preserved LVSF   CORONARY ANGIOPLASTY WITH STENT PLACEMENT  12/23/2007   by Dr End--- PCI w/ DES x2 to  pLAD   CORONARY STENT PLACEMENT  1999   PTCA w/ stenting to LAd   CYSTOSCOPY WITH RETROGRADE PYELOGRAM, URETEROSCOPY AND STENT PLACEMENT Left 05/10/2018   Procedure: CYSTOSCOPY WITH LEFT RETROGRADE PYELOGRAM, LEFT URETEROSCOPY AND LEFT URETERAL STENT PLACEMENT;  Surgeon: Sebastian Ache, MD;  Location: [facility];  Service: Urology;  Laterality: Left;   HOLMIUM LASER APPLICATION Left 05/10/2018   Procedure: HOLMIUM LASER APPLICATION;  Surgeon: Sebastian Ache, MD;  Location: [facility];  Service: Urology;  Laterality: Left;   INCISIONAL HERNIA REPAIR N/A 03/19/2019   Procedure: LAPAROSCOPIC INCISIONAL HERNIA REPAIR WITH MESH, RECURRENT UMBILICAL HERNIA REPAIR;  Surgeon: Karie Soda, MD;  Location: [facility];  Service: General;  Laterality: N/A;   INGUINAL HERNIA REPAIR Right 03/19/2019   Procedure: LAPAROSCOPIC BILATERAL FEMORAL AND INGUINAL HERNIA REPAIR WITH MESH, LYSIS OF ADHESIONS;  Surgeon: Karie Soda, MD;  Location: [facility];  Service: General;  Laterality: Right;   LEFT HEART CATH AND CORONARY ANGIOGRAPHY N/A  03/21/2017   Stents patent; mod circ dz, nothing for intervention--imdur added.  Procedure: LEFT HEART CATH AND CORONARY ANGIOGRAPHY;  Surgeon: Yvonne Kendall, MD;  Location: [facility];  Service: Cardiovascular;  Laterality: N/A;   LUMBAR SPINE SURGERY  1978   L5--S1   LUNG LOBECTOMY Right 1978   right upper lobectomy for blastomycosis   LYMPHADENECTOMY Bilateral 07/14/2015   Procedure: LYMPHADENECTOMY;  Surgeon: Heloise Purpura, MD;  Location: [facility];  Service: Urology;  Laterality: Bilateral;   ROBOT ASSISTED LAPAROSCOPIC RADICAL PROSTATECTOMY N/A 07/14/2015   One positive pelvic LN.  Procedure: XI ROBOTIC ASSISTED LAPAROSCOPIC RADICAL PROSTATECTOMY LEVEL 2;  Surgeon: Heloise Purpura, MD;  Location: [facility];  Service: Urology;  Laterality: N/A;   ROTATOR CUFF REPAIR  2007   UMBILICAL HERNIA REPAIR  07/14/2015   Procedure: HERNIA REPAIR UMBILICAL ADULT;  Surgeon: Heloise Purpura, MD;  Location: [facility];  Service: Urology;;    Outpatient Medications Prior to Visit  Medication Sig Dispense Refill   Dextromethorphan-quiNIDine (NUEDEXTA) 20-10 MG capsule Take 1 capsule by mouth 2 (two) times daily. 60 capsule 5   carbidopa-levodopa (SINEMET IR) 25-100 MG tablet Take 1 tablet by mouth 4 (four) times daily. (Patient not taking: Reported on 01/16/2023) 120 tablet 5   No facility-administered medications prior to visit.    Allergies  Allergen Reactions   Ace Inhibitors Cough   Imdur [Isosorbide Dinitrate] Other (See Comments)    Headache    Review of Systems As per HPI  PE:    01/16/2023    2:56 PM 01/16/2023    2:53 PM 12/14/2022   11:26 AM  Vitals with BMI  Weight  162 lbs 13 oz   Systolic 118 146 756  Diastolic 60 81 74  Pulse  63 54     Physical Exam  Gen: Alert, well appearing.  Patient is oriented to person, place, time, and situation.   LABS:  Last CBC Lab Results  Component Value Date   WBC 5.7 09/03/2022   HGB 14.3 09/03/2022   HCT 43.2 09/03/2022   MCV  102.0 (H) 09/03/2022   MCH 35.0 (H) 09/03/2022   RDW 12.8 09/03/2022   PLT 120 (L) 09/03/2022   Last metabolic panel Lab Results  Component Value Date   GLUCOSE 116 (H) 09/03/2022   NA 136 09/03/2022   K 3.9 09/03/2022   CL 99 09/03/2022   CO2 26 09/03/2022   BUN 22 09/03/2022   CREATININE 1.02 09/03/2022   GFRNONAA >60 09/03/2022   CALCIUM 8.9 09/03/2022   PROT 7.2 09/03/2022   ALBUMIN 4.3 09/03/2022   LABGLOB 2.8 09/04/2022   BILITOT 0.7 09/03/2022   ALKPHOS 86 09/03/2022   AST 23 09/03/2022   ALT 26 09/03/2022   ANIONGAP 11 09/03/2022   Last hemoglobin A1c Lab Results  Component Value Date   HGBA1C 5.2 09/04/2022   Last thyroid functions Lab Results  Component Value Date   TSH 3.373 09/03/2022   Last vitamin B12 and Folate Lab Results  Component Value Date   VITAMINB12 648 08/23/2022   IMPRESSION AND PLAN:  #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 Ongoing extensive/detailed evaluation per neurology, Dr. Loleta Chance. He drew a paraneoplastic panel to send off to [facility] today. Rosanne Ashing is going to have a lumbar puncture soon. Fortunately, Nuedexta has helped with his pseudobulbar affect.  Flu shot today. Recommended he get Tdap at pharmacy.  An After Visit Summary was printed and given to the patient.  FOLLOW UP: Return in about 3 months (around 04/17/2023) for routine chronic illness f/u.  Signed:  Santiago Bumpers, MD           01/16/2023

## 2023-01-23 ENCOUNTER — Encounter: Payer: Self-pay | Admitting: Neurology

## 2023-01-29 NOTE — Discharge Instructions (Signed)

## 2023-01-30 ENCOUNTER — Ambulatory Visit
Admission: RE | Admit: 2023-01-30 | Discharge: 2023-01-30 | Disposition: A | Discharge status: Home / Self Care | Payer: Medicare Other | Source: Ambulatory Visit | Attending: Neurology

## 2023-01-30 ENCOUNTER — Other Ambulatory Visit (HOSPITAL_COMMUNITY)
Admission: RE | Admit: 2023-01-30 | Discharge: 2023-01-30 | Disposition: A | Discharge status: Home / Self Care | Payer: Commercial Managed Care - PPO | Source: Ambulatory Visit | Attending: Neurology | Admitting: Neurology

## 2023-01-30 ENCOUNTER — Encounter: Payer: Self-pay | Admitting: Neurology

## 2023-01-30 VITALS — BP 128/69 | HR 53

## 2023-01-30 DIAGNOSIS — F482 Pseudobulbar affect: Secondary | ICD-10-CM | POA: Diagnosis not present

## 2023-01-30 DIAGNOSIS — R2689 Other abnormalities of gait and mobility: Secondary | ICD-10-CM | POA: Diagnosis not present

## 2023-01-30 DIAGNOSIS — M5481 Occipital neuralgia: Secondary | ICD-10-CM | POA: Diagnosis not present

## 2023-01-30 DIAGNOSIS — M6281 Muscle weakness (generalized): Secondary | ICD-10-CM

## 2023-01-30 DIAGNOSIS — R296 Repeated falls: Secondary | ICD-10-CM

## 2023-01-30 DIAGNOSIS — R251 Tremor, unspecified: Secondary | ICD-10-CM

## 2023-01-30 DIAGNOSIS — R278 Other lack of coordination: Secondary | ICD-10-CM | POA: Diagnosis not present

## 2023-01-30 DIAGNOSIS — R202 Paresthesia of skin: Secondary | ICD-10-CM | POA: Insufficient documentation

## 2023-01-30 DIAGNOSIS — R836 Abnormal cytological findings in cerebrospinal fluid: Secondary | ICD-10-CM | POA: Diagnosis not present

## 2023-01-30 DIAGNOSIS — R531 Weakness: Secondary | ICD-10-CM

## 2023-01-30 DIAGNOSIS — R4789 Other speech disturbances: Secondary | ICD-10-CM | POA: Diagnosis not present

## 2023-01-30 DIAGNOSIS — R258 Other abnormal involuntary movements: Secondary | ICD-10-CM

## 2023-01-30 DIAGNOSIS — R269 Unspecified abnormalities of gait and mobility: Secondary | ICD-10-CM

## 2023-01-30 NOTE — Progress Notes (Signed)
1 vial of blood drawn from pts Right AC to be sent off with LP lab work. 1 successful attempt, pt tolerated well. Gauze and tape applied after.

## 2023-01-31 LAB — CYTOLOGY - NON PAP

## 2023-02-04 ENCOUNTER — Other Ambulatory Visit: Payer: Self-pay

## 2023-02-04 ENCOUNTER — Other Ambulatory Visit (HOSPITAL_BASED_OUTPATIENT_CLINIC_OR_DEPARTMENT_OTHER): Payer: Self-pay

## 2023-02-08 ENCOUNTER — Encounter: Payer: Self-pay | Admitting: Neurology

## 2023-02-24 ENCOUNTER — Encounter: Payer: Self-pay | Admitting: Family Medicine

## 2023-02-26 NOTE — Progress Notes (Signed)
I saw William Montgomery in neurology clinic on 03/06/23 in follow up for imbalance, falls, pseudobulbar affect, and dysarthria.  HPI: William Montgomery is a 82 y.o. year old male with a history of cervical and lumbar degenerative disc disease s/p ACDF (C6-7 and L5-S1), OA, chronic headaches, CAD, HTN, CKD, HLD, NASH, hearing loss, prostate cancer who we last saw on 11/29/22.  To briefly review: 09/05/22: He had a headache on his right side of head/neck, behind the ear that was severe in earlier in 2023. He had a CT head and cervical spine. Head was unremarkable, but cervical spine showed severe cervical spondylosis. He had dry needling and injections in the neck without benefit in pain. He was then diagnosed with occipital neuralgia by PCP. He got an injection with immediate relief (12/2021). The headache came back after a few weeks. When he got another shot, it did not help.    Imbalance started in 02/2022. He saw his PCP who recommended exercise and monitoring. The symptoms continued to progress though. He feels like his balance is getting worse on a daily basis. He is shuffling while walking to get around. He endorses falls, about 3 falls in the last month, working in yard. It is more when leaning over or to the side.   He feels he is getting worse rapidly. He has noticed slurred speech and word finding difficulty a couple of months ago that he feels is getting worse recently. He has difficulty writing, and his signature looks small and not like his signature normally would. He has difficulty getting in and out of chairs. Symptoms gets worse as the day goes on usually. When he gets close to a step, he will find himself stopping, having to think about picking his leg up and having difficulty with this. He is very emotional, including tearing up during the history taking. Wife says this is not like him. He feels more depressed due to symptoms and has a lot of anxiety. He was prescribed propranolol for this  but he has never taken it.   Per wife, patient can have leg jerking when falling asleep or when in a deep sleep. There is no clear REM sleep behavior though. Patient does not feel like he sleeps well though. He has difficulty falling asleep (was taking CBD to help with this). He denies any changes to smell.   He denies ptosis, diplopia, difficulty chewing or swallowing. He denies orthopnea. He feels weaker but denies clear muscle atrophy, cramps, or twitching.   Pseudobulbar affect is present.   The patient does not report symptoms referable to autonomic dysfunction including impaired sweating, heat or cold intolerance, excessive mucosal dryness, gastroparetic early satiety, postprandial abdominal bloating, constipation, bowel or bladder dyscontrol, or syncope/presyncope/orthostatic intolerance.   The patient has not noticed any recent skin rashes nor does he report any constitutional symptoms like fever, night sweats, anorexia or unintentional weight loss.   Patient was advised to go to ED on 09/03/22 by PCP. Patient was admitted for more expedited work up from 09/03/22-09/04/22. 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 Labs were significant for CRP of 9.4, normal ESR, A1c of 5.2, normal TSH, CBC with MCV of 102 (B12 on 08/23/22 was 648). Other labs are pending. MRI brain showed no acute process with chronic small vessel ischemia and volume loss.   EtOH use: Very rare  Restrictive diet? No Family history of neuropathy/myopathy/neurologic disease? Brother with seizure disorder who passed away recently  Patient is most concerned he is getting dementia, parkinson's disease, or ALS.   10/10/22: Pending labs from hospitalization were normal (including vit E, copper, zinc, AChR ab, folate, IFE).    Patient did well on the sample of Neudexta, feeling it helped his mood, so this was prescribed on 09/10/22. This continues to work well.   He was  tolerating Sinemet but not sure if any difference had been made. Sinemet was increased to 1 tablet TID on 09/18/22 as he felt there may have been a small improvement in the fluidity of his movements. He is taking Sinemet at 6am, 1pm, and 7 pm. Patient is not sure if the Sinemet is changing any symptoms. He does not notice on or off symptoms. He denies any side effects to Sinemet or Neudexta. Wife thinks it may have helped a little. He continues to freeze, especially when approaching steps. Patient has had a couple of minor falls. One when he was on one knee fixing something and fell over. He also fell over when reaching over to get a package on the ground.   Patient denies any worsening of symptoms since last clinic visit. He denies difficulty swallowing. His speech is similar to prior. He denies significant tremors. He denies changes in sleep or concern for REM sleep disorders.   Patient started PT. He continues to go to the gym 4-5 times per week.  11/29/22: DaTSCAN was within normal limits.    Over the last 2 weeks, symptoms have worsened. His walking is worse. He is having a hard time getting on or off the elliptical. His legs are freezing up and not moving. He has had a couple of falls since last visit. He continues to take Sinemet 1 tablet TID (Sinemet at 5-6 am, 12-1 pm, 6-7 pm).    He also mentions his smell isn't as good as it used to be and his nose tends to run more. He denies obvious tremor. He may have some rest tremor in hands (pill rolling) but this is not clear. He has some leg jerks during sleep, but denies any REM sleep disorder like behavior.   He continues to take Neudexta and thinks it is still helping it, maybe less than when he first started taking it. He thinks his speech is about the same as prior. He denies difficulty swallowing. He denies weight loss or fevers.    He has some back pain from a recent fall, but otherwise has no pain.  Most recent Assessment and Plan  (11/29/22): [name], a 82 y.o. 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 Symptoms started around 02/2022 per patient. The etiology of patient's symptoms is currently unclear, but is concerning for a neurodegenerative condition. He has responded well to Neudexta for his pseudobulbar affect. I was initially concerned for parkinsonism, perhaps CBD. A trial of Sinemet has had unclear affect. DaTSCAN was normal. Today, patient has much clearly freezing and en bloc turns. The etiology of patient's symptoms remains unclear. 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 His orthostatic vitals are negative, making MSA unlikely. PSA or CBD are possible. Patient does not have weakness that would be consistent with other neurogenerative condition such as ALS, but PLS could be another consideration.   Plan: -Skin biopsy for parkinsonism -Continue Neudexta 20-10 mg BID -Change Sinemet to QID (6 am, 10 am, 2 pm, 6 pm) -Fall precautions discussed  Since their last visit:  After patient's last visit, I recommended EMG.  This was completed on 12/03/22. It shows residuals of an old left L3 and left C8 radiculopathy, but no active changes or widespread denervation/reinnervation to suggest an anterior horn cell disorder such as ALS. I did patient's skin biopsy on 12/18/22. This was negative for phosphorylated alpha synuclein or amyloid. Nerve fiber density in lower limb was noted to be low (as with small fiber neuropathy). Patient stopped Sinemet as he thinks it wasn't helping. Per his report on 01/09/23, he may have improved his freezing after. His symptoms were stable at that time. Patient wanted to pursue all available work up, so [facility] paraneoplastic serum panel and LP was done (LP on 01/30/23). Paraneoplastic panel was negative (see results below). CSF studies were normal (see results below).  Patient continues to  fall at least once a week. He is now using a walker. If he has to walk to much, he does use a wheelchair. He continues to feel like he can't control his feet. Patient is convinced he has a growth on his spine.  He denies any twitching muscles. He denies dysphagia. He denies tremors. He denies weight loss. He has urinary urgency, but no changes in bowel or bladder. He continues to freeze when he walks, especially if there is a change.  He continues to do well on Nuedexta with less PBA.   MEDICATIONS:  Outpatient Encounter Medications as of 03/06/2023  Medication Sig   Dextromethorphan-quiNIDine (NUEDEXTA) 20-10 MG capsule Take 1 capsule by mouth 2 (two) times daily.   [DISCONTINUED] Dextromethorphan-quiNIDine (NUEDEXTA) 20-10 MG capsule Take 1 capsule by mouth 2 (two) times daily.   No facility-administered encounter medications on file as of 03/06/2023.    PAST MEDICAL HISTORY: Past Medical History:  Diagnosis Date   Arthritis    joints/  back/  neck   Chronic headaches    due to cervical spondylosis   Chronic renal insufficiency, stage 2 (mild)    borderlines II/III   Coronary artery disease 1999   cardiologist--- dr Antoine Poche---  (per cardiology note pt refused to take asa/ statin) hx PCI w/ stenting in 1999 to LAD and 12-23-2007 x2 DES to proximal LAD;  Cath 03/2017-->  non- critical 70 - 80% mid/distal circ stenosis,  chronic small D1 80% stenosis (stable) and DES to proximal and mid LAD--   Med mgmt rec'd-->nitrates caused HAs, ACE-I caused cough.   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 Jarold Motto)   Erectile dysfunction after radical prostatectomy    History of kidney stones    History of prostate cancer 12/2014   urologist-- dr Retta Diones---   s/p Robot assisted radical prostatectomy 07/2015: lymph node involvement//   undetactable PSA since   HTN (hypertension)    ACE-I cough.  Fine  on ARB.   Hx of blastomycosis 1978   s/p  right upper lobectomy   Hyperlipidemia    NASH (nonalcoholic steatohepatitis) 06/2011   normal LFTS  /  Hep B and C testing neg.  Mild fatty liver on CT 2020   Nocturia    Parkinsonism (HCC)    2023/24, unknown etiology (Dat scan neg), peripheral nerve bx, and spinal fluid testing all NEG/Normal   Pseudobulbar affect    Pulmonary nodules    small, picked up on CT ab/pelv for abd pains/flank pains-->pt quit smoking 40 yrs ago.  Low risk for lung ca so  no f/u CT needed.   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   Tinnitus    Wears glasses    Wears hearing aid in both ears     PAST SURGICAL HISTORY: Past Surgical History:  Procedure Laterality Date   ANTERIOR CERVICAL DECOMP/DISCECTOMY FUSION  1982   C6--7   APPENDECTOMY  1953   CARDIAC CATHETERIZATION  02/27/2000   @[facility]  by dr hochrein ---  single vessel w/ preserved LVSF   CORONARY ANGIOPLASTY WITH STENT PLACEMENT  12/23/2007   by Dr End--- PCI w/ DES x2 to  pLAD   CORONARY STENT PLACEMENT  1999   PTCA w/ stenting to LAd   CYSTOSCOPY WITH RETROGRADE PYELOGRAM, URETEROSCOPY AND STENT PLACEMENT Left 05/10/2018   Procedure: CYSTOSCOPY WITH LEFT RETROGRADE PYELOGRAM, LEFT URETEROSCOPY AND LEFT URETERAL STENT PLACEMENT;  Surgeon: Sebastian Ache, MD;  Location: [facility];  Service: Urology;  Laterality: Left;   HOLMIUM LASER APPLICATION Left 05/10/2018   Procedure: HOLMIUM LASER APPLICATION;  Surgeon: Sebastian Ache, MD;  Location: [facility];  Service: Urology;  Laterality: Left;   INCISIONAL HERNIA REPAIR N/A 03/19/2019   Procedure: LAPAROSCOPIC INCISIONAL HERNIA REPAIR WITH MESH, RECURRENT UMBILICAL HERNIA REPAIR;  Surgeon: Karie Soda, MD;  Location: [facility];  Service: General;  Laterality: N/A;   INGUINAL HERNIA REPAIR Right  03/19/2019   Procedure: LAPAROSCOPIC BILATERAL FEMORAL AND INGUINAL HERNIA REPAIR WITH MESH, LYSIS OF ADHESIONS;  Surgeon: Karie Soda, MD;  Location: [facility];  Service: General;  Laterality: Right;   LEFT HEART CATH AND CORONARY ANGIOGRAPHY N/A 03/21/2017   Stents patent; mod circ dz, nothing for intervention--imdur added.  Procedure: LEFT HEART CATH AND CORONARY ANGIOGRAPHY;  Surgeon: Yvonne Kendall, MD;  Location: [facility];  Service: Cardiovascular;  Laterality: N/A;   LUMBAR SPINE SURGERY  1978   L5--S1   LUNG LOBECTOMY Right 1978   right upper lobectomy for blastomycosis   LYMPHADENECTOMY Bilateral 07/14/2015   Procedure: LYMPHADENECTOMY;  Surgeon: Heloise Purpura, MD;  Location: [facility];  Service: Urology;  Laterality: Bilateral;   ROBOT ASSISTED LAPAROSCOPIC RADICAL PROSTATECTOMY N/A 07/14/2015   One positive pelvic LN.  Procedure: XI ROBOTIC ASSISTED LAPAROSCOPIC RADICAL PROSTATECTOMY LEVEL 2;  Surgeon: Heloise Purpura, MD;  Location: [facility];  Service: Urology;  Laterality: N/A;   ROTATOR CUFF REPAIR  2007   UMBILICAL HERNIA REPAIR  07/14/2015   Procedure: HERNIA REPAIR UMBILICAL ADULT;  Surgeon: Heloise Purpura, MD;  Location: [facility];  Service: Urology;;    ALLERGIES: Allergies  Allergen Reactions   Ace Inhibitors Cough   Imdur [Isosorbide Dinitrate] Other (See Comments)    Headache    FAMILY HISTORY: Family History  Problem Relation Age of Onset   Cervical cancer Mother        deceased   Cirrhosis Father    Colon cancer Neg Hx     SOCIAL HISTORY: Social History   Tobacco Use   Smoking status: Former    Current packs/day: 0.00    Average packs/day: 0.5 packs/day for 20.0 years (10.0 ttl pk-yrs)    Types: Cigarettes    Start date: 76    Quit date: 59    Years since quitting: 46.8   Smokeless tobacco: Former    Types: Chew    Quit date: 2000  Tobacco comments:    Scoal  Vaping Use   Vaping status: Never Used  Substance Use Topics   Alcohol use:  Yes    Comment: seldom   Drug use: Never   Social History   Social History Narrative   Married, 2 adult children ([place] and [place]).   Lives in [place].     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   Tob (smoke and chew) x 20 yrs, quit approx 1980s.   Alcohol: very rare.   One cup coffee each morning.   Exercise: walks 3 miles about 4 times per week.   Active lifestyle   Two story home   Right handed    Objective:  Vital Signs:  BP 132/76   Pulse (!) 59   Ht 5\' 9"  (1.753 m)   Wt 165 lb (74.8 kg)   SpO2 97%   BMI 24.37 kg/m   General: General appearance: Awake and alert. No distress. Cooperative with exam.  Skin: No obvious rash or jaundice. HEENT: Atraumatic. Anicteric. Lungs: Non-labored breathing on room air  Extremities: No edema.  Neurological: Mental Status: Alert. Speech fluent. No obvious pseudobulbar affect today Cranial Nerves: CNII: No RAPD. Visual fields intact. CNIII, IV, VI: PERRL. No nystagmus. EOMI. CN V: Facial sensation intact bilaterally to fine touch. Jaw jerk negative. CN VII: Facial muscles symmetric and strong. No ptosis at rest. CN VIII: Hears finger rub well bilaterally. CN IX: No hypophonia. CN X: Palate elevates symmetrically. CN XI: Full strength shoulder shrug bilaterally. CN XII: Tongue protrusion full and midline. No atrophy or fasciculations. Mild dysarthria. Motor: Tone is mildly increased in bilateral upper extremities. No obvious fasciculations seen. No atrophy. Strength 5/5 in bilateral upper and lower extremities. Reflexes:  Right Left  Bicep 3+ 3+  Tricep 3+ 3+  BrRad 3+ 3+  Knee 3+ 3+  Ankle 2+ 2+   Sensation: Pinprick: Intact in all extremities Coordination: Intact finger-to- nose-finger bilaterally. Romberg negative. Finger and toe tapping appears normal. Gait: Very unsteady. Freezes often. Wide based gait. En bloc turns. Gait improves if he holds on to wall or chair. Very unsafe when  ambulating.   Lab and Test Review: New results: [facility] paraneoplastic panel: negative  Skin biopsy (12/18/22):   CSF (01/30/23): Routine analysis: 3 RBC, 1 WBC, 50 protein, 58 glucose (all wnl) Cytology negative CSF culture (bacterial and fungal): negative HSV negative VZV not detected Oligoclonal bands absent IgG index: normal at 0.46  EMG (12/03/22): NCV & EMG Findings: Extensive electrodiagnostic evaluation of the left upper and lower limbs with thoracic paraspinal muscles shows: Left superficial peroneal/fibular sensory response shows reduced amplitude (2 V). Left sural, median, ulnar, and radial sensory responses are within normal limits. Left peroneal/fibular (EDB) motor response shows reduced amplitude (1.37 mV). Left tibial (AH), median (APB), and ulnar (ADM) motor responses are within normal limits. Left H reflex latency is within normal limits. Chronic motor axon loss changes without active denervation changes are seen in the left rectus femoris, adductor longus, first dorsal interosseous, and extensor indicis proprius muscles.   Impression: This is an abnormal study. 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 No electrodiagnostic evidence of a disorder of anterior horn cells (  ie: motor neuron disease) at this time. No electrodiagnostic evidence of a large fiber sensorimotor neuropathy. The low amplitude left superficial peroneal/fibular sensory response may be due to age or technical factors. The low amplitude peroneal/fibular (EDB) motor response may also be age related or technical. There is no evidence of a peroneal/fibular neuropathy on needle examination.  CT head and  cervical spine (12/14/22): FINDINGS: CT HEAD FINDINGS   Brain: No acute intracranial hemorrhage. Gray-white differentiation is preserved. No hydrocephalus or extra-axial collection. No mass effect or midline shift.   Vascular: No hyperdense vessel or unexpected calcification.   Skull: No calvarial fracture or suspicious bone lesion. Skull base is unremarkable.   Sinuses/Orbits: No acute finding.   Other: Small left temporal scalp contusion.   CT CERVICAL SPINE FINDINGS   Alignment: Trace degenerative anterolisthesis of C4 on C5 and C7 on T1. No traumatic malalignment.   Skull base and vertebrae: No acute fracture. Normal craniocervical junction. No suspicious bone lesions. Acquired fusion of the C6-7 level.   Soft tissues and spinal canal: No prevertebral fluid or swelling. No visible canal hematoma.   Disc levels: Multilevel cervical spondylosis, worst at C5-6, where there is mild spinal canal stenosis.   Upper chest: Prior resection of the left first rib.   Other: None.   IMPRESSION: 1. No acute intracranial abnormality. Small left temporal scalp contusion. 2. No acute cervical spine fracture or traumatic malalignment.  Previously reviewed results: 09/04/22: HIV non-reactive Multiple myeloma panel pending ANA negative CRP: 9.4 ESR: 15 HbA1c: 5.2 Vit E, copper, zinc wnl   09/03/22: Lipid panel unremarkable Aldolase wnl (3.6) CK: 83 TSH: 3.373 UA unremarkable Urine drug screen positive for tetrahydrocannabinol EtOH: negative Ammonia < 10 CMP unremarkable CBC significant for MCV of 102, platelets 120 folate wnl, AChR ab negative   08/23/22: B12: 648 CMP: Cr 1.20, GFR 57.02 TSH: 2.67 CBC: MCV 104.2   Imaging: MRI brain wo contrast (09/03/22): FINDINGS: Brain: No acute infarct, mass effect or extra-axial collection. No acute or chronic hemorrhage. There is multifocal hyperintense T2-weighted signal within the white matter. Generalized volume  loss. The midline structures are normal.   Vascular: Major flow voids are preserved.   Skull and upper cervical spine: Normal calvarium and skull base. Visualized upper cervical spine and soft tissues are normal.   Sinuses/Orbits:No paranasal sinus fluid levels or advanced mucosal thickening. No mastoid or middle ear effusion. Normal orbits.   IMPRESSION: 1. No acute intracranial abnormality. 2. 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   CT head wo contrast (09/03/22): FINDINGS: Brain: There is no acute intracranial hemorrhage, extra-axial fluid collection, or acute infarct   There is background parenchymal volume loss with prominence of the ventricular system and extra-axial CSF spaces. The ventricles are slightly increased in size compared to the head CT from 2023 but without evidence of transependymal flow of CSF to suggest acute hydrocephalus. Gray-white differentiation is preserved. Mild hypodensity in the supratentorial white matter likely reflects sequela of mild chronic small-vessel ischemic change.   The pituitary and suprasellar region are normal. There is no mass lesion there is no mass effect or midline shift.   Vascular: No hyperdense vessel or unexpected calcification.   Skull: Normal. Negative for fracture or focal lesion.   Sinuses/Orbits: The imaged paranasal sinuses are clear. The imaged globes and orbits are unremarkable.   Other: None.   IMPRESSION: No acute intracranial pathology.   MRI cervical, thoracic, and lumbar spine wo contrast (08/25/22): FINDINGS: MRI CERVICAL SPINE FINDINGS  Alignment: Straightening of the normal cervical lordosis. Trace anterolisthesis of C4 on C5 and C7 on T1.   Vertebrae: No acute fracture, suspicious osseous lesion, or evidence of discitis. Redemonstrated fusion of C6 and C7, favored to be the sequela of prior surgery. Unchanged vertebral body height loss C5.   Cord: Normal signal and  morphology.   Posterior Fossa, vertebral arteries, paraspinal tissues: Negative.   Disc levels:   C2-C3: No significant disc bulge. Left-greater-than-right facet arthropathy. No spinal canal stenosis. Mild left neural foraminal narrowing, unchanged.   C3-C4: No significant disc bulge. Left-greater-than-right facet arthropathy and bilateral uncovertebral hypertrophy. No spinal canal stenosis. Severe right and moderate left neural foraminal narrowing, unchanged.   C4-C5: Trace anterolisthesis with disc unroofing. Right-greater-than-left facet and uncovertebral hypertrophy. No spinal canal stenosis. Severe bilateral neural foraminal narrowing, right greater than left, unchanged   C5-C6: Disc height loss and mild disc bulge. Facet and uncovertebral hypertrophy. Mild spinal canal stenosis and moderate to severe bilateral neural foraminal narrowing, unchanged.   C6-C7: Osseous fusion across the disc space. Facet and uncovertebral hypertrophy. No spinal canal stenosis. Mild bilateral neural foraminal narrowing, unchanged   C7-T1: Trace anterolisthesis and disc unroofing. Facet and uncovertebral hypertrophy. Mild spinal canal stenosis and mild-to-moderate bilateral neural foraminal narrowing, unchanged.   MRI THORACIC SPINE FINDINGS   Alignment: 3 mm anterolisthesis of T1 on T2. No other listhesis in the thoracic spine. Mild S-shaped curvature of the thoracolumbar spine.   Vertebrae: No acute fracture, suspicious osseous lesion, or evidence of discitis.   Cord:  Normal signal and morphology.   Paraspinal and other soft tissues: No acute finding.   Disc levels:   T1-T2: Trace anterolisthesis with disc unroofing. Facet arthropathy. Mild spinal canal stenosis. Mild right and moderate left neural foraminal narrowing.   No other significant spinal canal stenosis or neural foraminal narrowing in the thoracic spine.   MRI LUMBAR SPINE FINDINGS   Segmentation:  5 lumbar type  vertebral bodies.   Alignment: S-shaped curvature of the thoracolumbar spine. Trace retrolisthesis of L3 on L4.   Vertebrae: No acute fracture, suspicious osseous lesion, or evidence of discitis.   Conus medullaris and cauda equina: Conus extends to the L1-L2 level. Conus and cauda equina appear normal.   Paraspinal and other soft tissues: Increased T2 signal between the spinous processes of L3 and L4 (series 3, image 12).   Disc levels:   T12-L1: No significant disc bulge. Small left paracentral annular fissure. No spinal canal stenosis or neural foraminal narrowing.   L1-L2: Mild disc bulge with right foraminal/extreme lateral protrusion. Mild facet arthropathy. No spinal canal stenosis or neural foraminal narrowing.   L2-L3: Mild disc bulge. Mild facet arthropathy. Narrowing of the right lateral recess. No spinal canal stenosis or neural foraminal narrowing.   L3-L4: Trace retrolisthesis and mild disc bulge, eccentric to the right with right extreme lateral disc protrusion, which may contact the exiting right L3 nerve. Moderate facet arthropathy. Narrowing of the right lateral recess. No spinal canal stenosis. Mild right neural foraminal narrowing.   L4-L5: Mild disc bulge with superimposed right foraminal and extreme lateral protrusion, which may contact the exiting right L4 nerve. Mild facet arthropathy. Narrowing of the lateral recesses. No spinal canal stenosis. Mild right neural foraminal narrowing.   L5-S1: Disc height loss and mild broad-based disc bulge with superimposed left paracentral disc protrusion. Mild facet arthropathy. No spinal canal stenosis. Moderate right and mild left neural foraminal narrowing.   IMPRESSION: CERVICAL SPINE:   1. C5-C6  mild spinal canal stenosis and moderate to severe bilateral neural foraminal narrowing. 2. C7-T1 mild spinal canal stenosis and mild-to-moderate bilateral neural foraminal narrowing. 3. C3-C4 severe right and  moderate left neural foraminal narrowing. 4. C4-C5 severe bilateral neural foraminal narrowing.   THORACIC SPINE:   T1-T2 mild spinal canal stenosis, with moderate left and mild right neural foraminal narrowing.   LUMBAR SPINE:   1. L5-S1 moderate right and mild left neural foraminal narrowing. 2. L3-L4 and L4-L5 mild right neural foraminal narrowing. At L3-L4 and L4-L5, right extreme lateral disc protrusion may contact the exiting right L3 and L4 nerves, as well as narrowing of the right lateral recesses, which could affect the descending right L4 and L5 nerve roots, respectively. 3. No spinal canal stenosis. 4. Multilevel facet arthropathy, which can be a cause of back pain. 5. 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  DaTSCAN (10/24/22): FINDINGS: Symmetric intense uptake within LEFT and RIGHT striata. The heads of the caudate nuclei and the posterior striata (putamen) are normal shape. No evidence of loss of dopamine transport populations in the basal ganglia.   IMPRESSION: Ioflupane scan within normal limits. No reduced radiotracer activity in basal ganglia to suggest Parkinson's syndrome pathology.   Of note, DaTSCAN is not diagnostic of Parkinsonian syndromes, which remains a clinical diagnosis. DaTscan is an adjuvant test to aid in the clinical diagnosis of Parkinsonian syndromes.  ASSESSMENT: [name], a 82 y.o. 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 Symptoms started around 02/2022 per patient. Patient's symptoms are concerning for a neurodegenerative condition. He has responded well to Neudexta for his pseudobulbar affect. I was initially concerned for parkinsonism, perhaps CBD. A trial of Sinemet did not appear to have any affect. DaTSCAN was normal. 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 This would not explain symptoms though. [facility] movement disorders paraneoplastic panel was negative. Lumbar puncture was normal (routine analysis, IgG index, OCB, HSV, VZV, cytology, culture). MRI neuroaxis also did not show pathology to explain symptoms.  The etiology of patient's symptoms remains unclear despite the extensive work up. 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 His orthostatic vitals are negative, making MSA unlikely. CBD is possible. Patient does not have weakness that would be consistent with other neurogenerative condition such as ALS, but PLS could be another consideration.   Plan: -Referral to [facility] neurology (movement disorders) for second opinion -May consider repeating EMG at 6 months (~06/2022) -Continue Nuedexta 20-10 mg BID  -Fall precautions discussed -Will discuss with radiology at patient's request - Is there a growth at the rib that has been removed?  Return to clinic in 6 months  Total time spent reviewing records, interview, history/exam, documentation, and coordination of care on day of encounter:  45 min  Jacquelyne Balint, MD

## 2023-02-28 LAB — CSF CULTURE W GRAM STAIN
MICRO NUMBER:: 15514219
Result:: NO GROWTH
SPECIMEN QUALITY:: ADEQUATE

## 2023-02-28 LAB — FUNGUS CULTURE W SMEAR
CULTURE:: NO GROWTH
MICRO NUMBER:: 15514218
SMEAR:: NONE SEEN
SPECIMEN QUALITY:: ADEQUATE

## 2023-02-28 LAB — PROTEIN, CSF: Total Protein, CSF: 50 mg/dL (ref 15–60)

## 2023-02-28 LAB — CNS IGG SYNTHESIS RATE, CSF+BLOOD
Albumin Serum: 4.8 g/dL (ref 3.6–5.1)
Albumin, CSF: 29.2 mg/dL (ref 8.0–42.0)
CNS-IgG Synthesis Rate: -2.9 mg/(24.h) (ref <=3.3)
IgG (Immunoglobin G), Serum: 860 mg/dL (ref 600–1540)
IgG Total CSF: 2.4 mg/dL (ref 0.8–7.7)
IgG-Index: 0.46 (ref <0.70)

## 2023-02-28 LAB — CSF CELL COUNT WITH DIFFERENTIAL
RBC Count, CSF: 3 {cells}/uL — ABNORMAL HIGH
TOTAL NUCLEATED CELL: 1 {cells}/uL (ref 0–5)

## 2023-02-28 LAB — VARICELLA ZOSTER VIRUS (VZV) DNA, QUANTITATIVE REAL-TIME PCR
VARICELLA ZOSTER VIRUS (VZV) DNA, QN RT PCR: NOT DETECTED {Log}
VARICELLA ZOSTER VIRUS (VZV) DNA, QN RT PCR: NOT DETECTED {copies}/mL

## 2023-02-28 LAB — HERPES SIMPLEX VIRUS 1/2 (IGG), CSF
HSV 1 IgG Index:: 0.45
HSV 2 IgG Index:: <0.01

## 2023-02-28 LAB — GLUCOSE, CSF: Glucose, CSF: 58 mg/dL (ref 40–80)

## 2023-02-28 LAB — OLIGOCLONAL BANDS, CSF + SERM: Oligo Bands: ABSENT

## 2023-03-02 ENCOUNTER — Other Ambulatory Visit: Payer: Self-pay | Admitting: Neurology

## 2023-03-02 DIAGNOSIS — F482 Pseudobulbar affect: Secondary | ICD-10-CM

## 2023-03-04 ENCOUNTER — Other Ambulatory Visit (HOSPITAL_BASED_OUTPATIENT_CLINIC_OR_DEPARTMENT_OTHER): Payer: Self-pay

## 2023-03-04 MED ORDER — NUEDEXTA 20-10 MG PO CAPS
1.0000 | ORAL_CAPSULE | Freq: Two times a day (BID) | ORAL | 0 refills | Status: DC
  Filled 2023-03-04: qty 60, 30d supply, fill #0

## 2023-03-06 ENCOUNTER — Ambulatory Visit (INDEPENDENT_AMBULATORY_CARE_PROVIDER_SITE_OTHER): Payer: Commercial Managed Care - PPO | Admitting: Neurology

## 2023-03-06 ENCOUNTER — Encounter: Payer: Self-pay | Admitting: Neurology

## 2023-03-06 VITALS — BP 132/76 | HR 59 | Ht 69.0 in | Wt 165.0 lb

## 2023-03-06 DIAGNOSIS — R269 Unspecified abnormalities of gait and mobility: Secondary | ICD-10-CM | POA: Diagnosis not present

## 2023-03-06 DIAGNOSIS — R202 Paresthesia of skin: Secondary | ICD-10-CM | POA: Diagnosis not present

## 2023-03-06 DIAGNOSIS — F482 Pseudobulbar affect: Secondary | ICD-10-CM

## 2023-03-06 DIAGNOSIS — R4789 Other speech disturbances: Secondary | ICD-10-CM | POA: Diagnosis not present

## 2023-03-06 DIAGNOSIS — R2689 Other abnormalities of gait and mobility: Secondary | ICD-10-CM | POA: Diagnosis not present

## 2023-03-06 DIAGNOSIS — R258 Other abnormal involuntary movements: Secondary | ICD-10-CM

## 2023-03-06 DIAGNOSIS — R296 Repeated falls: Secondary | ICD-10-CM | POA: Diagnosis not present

## 2023-03-06 NOTE — Patient Instructions (Addendum)
We discussed today that your extensive work up has still not revealed a cause of your symptoms. We agreed to a second opinion at [facility] neurology.  Continue Nuedexta  I will try to discuss with radiology about your concern for a growth on your rib.  Follow up in 6 months or sooner if needed.  The physicians and staff at [facility] are committed to providing excellent care. You may receive a survey requesting feedback about your experience at our office. We strive to receive "very good" responses to the survey questions. If you feel that your experience would prevent you from giving the office a "very good " response, please contact our office to try to remedy the situation. We may be reached at [phone]. Thank you for taking the time out of your busy day to complete the survey.  Jacquelyne Balint, MD [facility]  Preventing Falls at [facility] are common, often dreaded events in the lives of older people. Aside from the obvious injuries and even death that may result, fall can cause wide-ranging consequences including loss of independence, mental decline, decreased activity and mobility. Younger people are also at risk of falling, especially those with chronic illnesses and fatigue.  Ways to reduce risk for falling Examine diet and medications. Warm foods and alcohol dilate blood vessels, which can lead to dizziness when standing. Sleep aids, antidepressants and pain medications can also increase the likelihood of a fall.  Get a vision exam. Poor vision, cataracts and glaucoma increase the chances of falling.  Check foot gear. Shoes should fit snugly and have a sturdy, nonskid sole and a broad, low heel  Participate in a physician-approved exercise program to build and maintain muscle strength and improve balance and coordination. Programs that use ankle weights or stretch bands are excellent for muscle-strengthening. Water aerobics programs and low-impact Tai Chi programs  have also been shown to improve balance and coordination.  Increase vitamin D intake. Vitamin D improves muscle strength and increases the amount of calcium the body is able to absorb and deposit in bones.  How to prevent falls from common hazards Floors - Remove all loose wires, cords, and throw rugs. Minimize clutter. Make sure rugs are anchored and smooth. Keep furniture in its usual place.  Chairs -- Use chairs with straight backs, armrests and firm seats. Add firm cushions to existing pieces to add height.  Bathroom - Install grab bars and non-skid tape in the tub or shower. Use a bathtub transfer bench or a shower chair with a back support Use an elevated toilet seat and/or safety rails to assist standing from a low surface. Do not use towel racks or bathroom tissue holders to help you stand.  Lighting - Make sure halls, stairways, and entrances are well-lit. Install a night light in your bathroom or hallway. Make sure there is a light switch at the top and bottom of the staircase. Turn lights on if you get up in the middle of the night. Make sure lamps or light switches are within reach of the bed if you have to get up during the night.  Kitchen - Install non-skid rubber mats near the sink and stove. Clean spills immediately. Store frequently used utensils, pots, pans between waist and eye level. This helps prevent reaching and bending. Sit when getting things out of lower cupboards.  Living room/ Bedrooms - Place furniture with wide spaces in between, giving enough room to move around. Establish a route through the living room that  gives you something to hold onto as you walk.  Stairs - Make sure treads, rails, and rugs are secure. Install a rail on both sides of the stairs. If stairs are a threat, it might be helpful to arrange most of your activities on the lower level to reduce the number of times you must climb the stairs.  Entrances and doorways - Install metal handles on the walls  adjacent to the doorknobs of all doors to make it more secure as you travel through the doorway.  Tips for maintaining balance Keep at least one hand free at all times. Try using a backpack or fanny pack to hold things rather than carrying them in your hands. Never carry objects in both hands when walking as this interferes with keeping your balance.  Attempt to swing both arms from front to back while walking. This might require a conscious effort if Parkinson's disease has diminished your movement. It will, however, help you to maintain balance and posture, and reduce fatigue.  Consciously lift your feet off of the ground when walking. Shuffling and dragging of the feet is a common culprit in losing your balance.  When trying to navigate turns, use a "U" technique of facing forward and making a wide turn, rather than pivoting sharply.  Try to stand with your feet shoulder-length apart. When your feet are close together for any length of time, you increase your risk of losing your balance and falling.  Do one thing at a time. Don't try to walk and accomplish another task, such as reading or looking around. The decrease in your automatic reflexes complicates motor function, so the less distraction, the better.  Do not wear rubber or gripping soled shoes, they might "catch" on the floor and cause tripping.  Move slowly when changing positions. Use deliberate, concentrated movements and, if needed, use a grab bar or walking aid. Count 15 seconds between each movement. For example, when rising from a seated position, wait 15 seconds after standing to begin walking.  If balance is a continuous problem, you might want to consider a walking aid such as a cane, walking stick, or walker. Once you've mastered walking with help, you might be ready to try it on your own again.

## 2023-03-11 ENCOUNTER — Encounter: Payer: Self-pay | Admitting: Neurology

## 2023-03-17 NOTE — Progress Notes (Deleted)
  Cardiology Office Note:   Date:  03/17/2023  ID:  William, Montgomery [birth year], MRN [id number] PCP: Jeoffrey Massed, MD  [facility] Providers Cardiologist:  Rollene Rotunda, MD {  History of Present Illness:   William Montgomery is a 82 y.o. male who presents for follow up of CAD.  At a previous visit he had decreased exercise tolerance.  I sent him for cath and he was found to have 70 - 80% mid/distal circ stenosis and a small D1 80% stenosis and proximal stents in the prox and mid LAD.  He was managed medically.  Following this we try to treat him with Imdur and he developed severe headaches.  He had a cough on lisinopril.    Since I last saw him ***   *** he has done very well.  He has started walking 14 flights of stairs every day.  He denies any chest pressure, neck or arm discomfort.  He denies any palpitations, presyncope or syncope.  He has no shortness of breath, PND or orthopnea.  He has had no weight gain or edema.  ROS: ***  Studies Reviewed:    EKG:       ***  Risk Assessment/Calculations:   {Does this patient have ATRIAL FIBRILLATION?[phone]} No BP recorded.  {Refresh Note OR Click here to enter BP  :1}***        Physical Exam:   VS:  There were no vitals taken for this visit.   Wt Readings from Last 3 Encounters:  03/06/23 165 lb (74.8 kg)  01/16/23 162 lb 12.8 oz (73.8 kg)  12/14/22 155 lb (70.3 kg)     GEN: Well nourished, well developed in no acute distress NECK: No JVD; No carotid bruits CARDIAC: ***RR, *** murmurs, rubs, gallops RESPIRATORY:  Clear to auscultation without rales, wheezing or rhonchi  ABDOMEN: Soft, non-tender, non-distended EXTREMITIES:  No edema; No deformity   ASSESSMENT AND PLAN:   CAD - The patient has ***   no new sypmtoms.  No further cardiovascular testing is indicated.  We will continue with aggressive risk reduction and meds as listed.he does not want to take an aspirin   DYSLIPIDEMIA -  LDL was  *** 120.   However, he absolutely does not want to take statin.  He has not on any other medications.  Is very much against the pharmaceutical industry.    HTN (hypertension) -  The blood pressure is ***  at target.  No change in therapy.      Follow up ***  Signed, Rollene Rotunda, MD

## 2023-03-20 ENCOUNTER — Ambulatory Visit: Payer: Commercial Managed Care - PPO | Admitting: Cardiology

## 2023-03-20 DIAGNOSIS — I10 Essential (primary) hypertension: Secondary | ICD-10-CM

## 2023-03-20 DIAGNOSIS — E785 Hyperlipidemia, unspecified: Secondary | ICD-10-CM

## 2023-03-20 DIAGNOSIS — I2510 Atherosclerotic heart disease of native coronary artery without angina pectoris: Secondary | ICD-10-CM

## 2023-03-28 ENCOUNTER — Other Ambulatory Visit: Payer: Self-pay | Admitting: Neurology

## 2023-03-28 DIAGNOSIS — F482 Pseudobulbar affect: Secondary | ICD-10-CM

## 2023-04-01 ENCOUNTER — Other Ambulatory Visit (HOSPITAL_BASED_OUTPATIENT_CLINIC_OR_DEPARTMENT_OTHER): Payer: Self-pay

## 2023-04-01 MED ORDER — NUEDEXTA 20-10 MG PO CAPS
1.0000 | ORAL_CAPSULE | Freq: Two times a day (BID) | ORAL | 2 refills | Status: DC
Start: 2023-04-01 — End: 2023-06-26
  Filled 2023-04-01: qty 60, 30d supply, fill #0
  Filled 2023-04-29: qty 60, 30d supply, fill #1
  Filled 2023-05-30: qty 60, 30d supply, fill #2

## 2023-04-03 ENCOUNTER — Telehealth: Payer: Self-pay

## 2023-04-03 DIAGNOSIS — C61 Malignant neoplasm of prostate: Secondary | ICD-10-CM

## 2023-04-03 DIAGNOSIS — E7800 Pure hypercholesterolemia, unspecified: Secondary | ICD-10-CM

## 2023-04-03 DIAGNOSIS — I10 Essential (primary) hypertension: Secondary | ICD-10-CM

## 2023-04-03 NOTE — Telephone Encounter (Signed)
Sure, he can make a lab appointment anytime.  Orders are in.

## 2023-04-03 NOTE — Telephone Encounter (Signed)
Patient wife, Steward Drone called to reschedule appt on 12/11. Patient has appt with neurologist that day also.  Steward Drone states 2 appts in 1 day would be too much for patient. I rescheduled CPE appt to 12/18. Dr. Milinda Cave does not have any AM appts for CPE open for the month of December.  Can patient come in earlier to draw labs prior to appt on 12/18?  If approved, please make sure labs are ordered. Patient to schedule lab appt 12/17 or 12/18 in morning so patient can fast.  Please send back to front desk (if approved) so Annabelle Harman or Karena Addison can call to schedule lab appt.

## 2023-04-07 ENCOUNTER — Encounter: Payer: Self-pay | Admitting: Neurology

## 2023-04-17 ENCOUNTER — Ambulatory Visit: Payer: Commercial Managed Care - PPO | Admitting: Family Medicine

## 2023-04-17 ENCOUNTER — Other Ambulatory Visit: Payer: Self-pay

## 2023-04-17 ENCOUNTER — Other Ambulatory Visit (HOSPITAL_BASED_OUTPATIENT_CLINIC_OR_DEPARTMENT_OTHER): Payer: Self-pay

## 2023-04-17 DIAGNOSIS — G20C Parkinsonism, unspecified: Secondary | ICD-10-CM | POA: Diagnosis not present

## 2023-04-17 MED ORDER — CARBIDOPA-LEVODOPA 25-100 MG PO TABS
2.0000 | ORAL_TABLET | Freq: Three times a day (TID) | ORAL | 1 refills | Status: DC
  Filled 2023-04-17: qty 540, 90d supply, fill #0
  Filled 2023-07-13: qty 540, 90d supply, fill #1

## 2023-04-24 ENCOUNTER — Other Ambulatory Visit: Payer: Commercial Managed Care - PPO

## 2023-04-24 ENCOUNTER — Encounter: Payer: Commercial Managed Care - PPO | Admitting: Family Medicine

## 2023-05-03 ENCOUNTER — Encounter: Payer: Self-pay | Admitting: Family Medicine

## 2023-05-03 ENCOUNTER — Ambulatory Visit (INDEPENDENT_AMBULATORY_CARE_PROVIDER_SITE_OTHER): Payer: Commercial Managed Care - PPO | Admitting: Family Medicine

## 2023-05-03 VITALS — BP 139/80 | HR 57 | Temp 94.4°F | Ht 69.0 in | Wt 169.2 lb

## 2023-05-03 DIAGNOSIS — I10 Essential (primary) hypertension: Secondary | ICD-10-CM

## 2023-05-03 DIAGNOSIS — C61 Malignant neoplasm of prostate: Secondary | ICD-10-CM | POA: Diagnosis not present

## 2023-05-03 DIAGNOSIS — Z125 Encounter for screening for malignant neoplasm of prostate: Secondary | ICD-10-CM

## 2023-05-03 DIAGNOSIS — R269 Unspecified abnormalities of gait and mobility: Secondary | ICD-10-CM | POA: Diagnosis not present

## 2023-05-03 DIAGNOSIS — E7800 Pure hypercholesterolemia, unspecified: Secondary | ICD-10-CM

## 2023-05-03 DIAGNOSIS — R531 Weakness: Secondary | ICD-10-CM

## 2023-05-03 DIAGNOSIS — I2510 Atherosclerotic heart disease of native coronary artery without angina pectoris: Secondary | ICD-10-CM | POA: Diagnosis not present

## 2023-05-03 DIAGNOSIS — R471 Dysarthria and anarthria: Secondary | ICD-10-CM

## 2023-05-03 NOTE — Progress Notes (Signed)
Office Note 05/03/2023  CC:  Chief Complaint  Patient presents with   Annual Exam    Pt is fasting    HPI:  Patient is a 82 y.o. male who is here accompanied by his wife for annual health maintenance exam. He has a history of hypertension, hyperlipidemia, and coronary artery disease. He prefers to avoid all medicines for these conditions if at all possible.  [name] underwent second opinion neurologic evaluation at the [facility] division of movement disorders on 04/17/2023. 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  Trial of higher dose Sinemet up to 2 tabs 3 times a day recommended as well as obtain images of the DATscan that was done 10/28/2022. He is not doing any better, unfortunately.  He is able to get up out of his chair and use a walker but ambulates slowly and unsteadily. Speech is getting more slurred gradually.  He is able to do all activities of daily living independently. He denies any pain. His mood and anxiety levels seem to be doing okay, especially considering the circumstances.  Past Medical History:  Diagnosis Date   Arthritis    joints/  back/  neck   Chronic headaches    due to cervical spondylosis   Chronic renal insufficiency, stage 2 (mild)    borderlines II/III   Coronary artery disease 1999   cardiologist--- dr Antoine Poche---  (per cardiology note pt refused to take asa/ statin) hx PCI w/ stenting in 1999 to LAD and 12-23-2007 x2 DES to proximal LAD;  Cath 03/2017-->  non- critical 70 - 80% mid/distal circ stenosis,  chronic small D1 80% stenosis (stable) and DES to proximal and mid LAD--   Med mgmt rec'd-->nitrates caused HAs, ACE-I caused cough.   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 Jarold Motto)   Erectile dysfunction after radical prostatectomy    History of kidney stones    History of prostate cancer 12/2014   urologist-- dr Retta Diones---   s/p Robot assisted radical  prostatectomy 07/2015: lymph node involvement//   undetactable PSA since   HTN (hypertension)    ACE-I cough.  Fine on ARB.   Hx of blastomycosis 1978   s/p  right upper lobectomy   Hyperlipidemia    NASH (nonalcoholic steatohepatitis) 06/2011   normal LFTS  /  Hep B and C testing neg.  Mild fatty liver on CT 2020   Nocturia    Parkinsonism (HCC)    2023/24, unknown etiology (Dat scan neg), peripheral nerve bx, and spinal fluid testing all NEG/Normal   Pseudobulbar affect    Pulmonary nodules    small, picked up on CT ab/pelv for abd pains/flank pains-->pt quit smoking 40 yrs ago.  Low risk for lung ca so no f/u CT needed.   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   Tinnitus    Wears glasses    Wears hearing aid in both ears     Past Surgical History:  Procedure Laterality Date   ANTERIOR CERVICAL DECOMP/DISCECTOMY FUSION  1982   C6--7   APPENDECTOMY  1953   CARDIAC CATHETERIZATION  02/27/2000   @[facility]  by dr hochrein ---  single vessel w/ preserved LVSF   CORONARY ANGIOPLASTY WITH STENT PLACEMENT  12/23/2007   by Dr End--- PCI w/ DES x2 to  pLAD   CORONARY STENT PLACEMENT  1999   PTCA w/ stenting to LAd   CYSTOSCOPY WITH RETROGRADE PYELOGRAM, URETEROSCOPY AND STENT PLACEMENT Left 05/10/2018   Procedure: CYSTOSCOPY WITH LEFT RETROGRADE PYELOGRAM, LEFT URETEROSCOPY AND LEFT URETERAL STENT PLACEMENT;  Surgeon: Sebastian Ache, MD;  Location: [facility];  Service: Urology;  Laterality: Left;   HOLMIUM LASER APPLICATION Left 05/10/2018   Procedure: HOLMIUM LASER APPLICATION;  Surgeon: Sebastian Ache, MD;  Location: [facility];  Service: Urology;  Laterality: Left;   INCISIONAL HERNIA REPAIR N/A 03/19/2019   Procedure: LAPAROSCOPIC INCISIONAL HERNIA REPAIR WITH MESH, RECURRENT UMBILICAL HERNIA REPAIR;  Surgeon: Karie Soda,  MD;  Location: [facility];  Service: General;  Laterality: N/A;   INGUINAL HERNIA REPAIR Right 03/19/2019   Procedure: LAPAROSCOPIC BILATERAL FEMORAL AND INGUINAL HERNIA REPAIR WITH MESH, LYSIS OF ADHESIONS;  Surgeon: Karie Soda, MD;  Location: [facility];  Service: General;  Laterality: Right;   LEFT HEART CATH AND CORONARY ANGIOGRAPHY N/A 03/21/2017   Stents patent; mod circ dz, nothing for intervention--imdur added.  Procedure: LEFT HEART CATH AND CORONARY ANGIOGRAPHY;  Surgeon: Yvonne Kendall, MD;  Location: [facility];  Service: Cardiovascular;  Laterality: N/A;   LUMBAR SPINE SURGERY  1978   L5--S1   LUNG LOBECTOMY Right 1978   right upper lobectomy for blastomycosis   LYMPHADENECTOMY Bilateral 07/14/2015   Procedure: LYMPHADENECTOMY;  Surgeon: Heloise Purpura, MD;  Location: [facility];  Service: Urology;  Laterality: Bilateral;   ROBOT ASSISTED LAPAROSCOPIC RADICAL PROSTATECTOMY N/A 07/14/2015   One positive pelvic LN.  Procedure: XI ROBOTIC ASSISTED LAPAROSCOPIC RADICAL PROSTATECTOMY LEVEL 2;  Surgeon: Heloise Purpura, MD;  Location: [facility];  Service: Urology;  Laterality: N/A;   ROTATOR CUFF REPAIR  2007   UMBILICAL HERNIA REPAIR  07/14/2015   Procedure: HERNIA REPAIR UMBILICAL ADULT;  Surgeon: Heloise Purpura, MD;  Location: [facility];  Service: Urology;;    Family History  Problem Relation Age of Onset   Cervical cancer Mother        deceased   Cirrhosis Father    Colon cancer Neg Hx     Social History   Socioeconomic History   Marital status: Married    Spouse name: Not on file   Number of children: 2   Years of education: Not on file   Highest education level: Master's degree (e.g., MA, MS, MEng, MEd, MSW, MBA)  Occupational History   Occupation: Retired  Tobacco Use   Smoking status: Former    Current packs/day: 0.00    Average packs/day: 0.5 packs/day for 20.0 years (10.0 ttl pk-yrs)    Types: Cigarettes    Start date: 69    Quit date: 1978    Years since  quitting: 47.0   Smokeless tobacco: Former    Types: Chew    Quit date: 2000   Tobacco comments:    Retail buyer Use   Vaping status: Never Used  Substance and Sexual Activity   Alcohol use: Yes    Comment: seldom   Drug use: Never   Sexual activity: Not on file  Other Topics Concern   Not on file  Social History Narrative   Married, 2 adult children ([place] and [place]).   Lives in [place].     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   Tob (smoke and chew) x 20 yrs, quit approx 1980s.   Alcohol: very rare.  One cup coffee each morning.   Exercise: walks 3 miles about 4 times per week.   Active lifestyle   Two story home   Right handed   Social Drivers of Health   Financial Resource Strain: Low Risk  (04/23/2023)   Overall Financial Resource Strain (CARDIA)    Difficulty of Paying Living Expenses: Not hard at all  Food Insecurity: No Food Insecurity (04/23/2023)   Hunger Vital Sign    Worried About Running Out of Food in the Last Year: Never true    Ran Out of Food in the Last Year: Never true  Transportation Needs: No Transportation Needs (04/23/2023)   PRAPARE - Administrator, Civil Service (Medical): No    Lack of Transportation (Non-Medical): No  Physical Activity: Inactive (04/23/2023)   Exercise Vital Sign    Days of Exercise per Week: 0 days    Minutes of Exercise per Session: 30 min  Stress: No Stress Concern Present (04/23/2023)   [employer] of Occupational Health - Occupational Stress Questionnaire    Feeling of Stress : Only a little  Social Connections: Moderately Isolated (04/23/2023)   Social Connection and Isolation Panel [NHANES]    Frequency of Communication with Friends and Family: Three times a week    Frequency of Social Gatherings with Friends and Family: Once a week    Attends Religious Services: Never    Database administrator or Organizations: No    Attends Banker Meetings: Never    Marital  Status: Married  Catering manager Violence: Not At Risk (09/11/2022)   Humiliation, Afraid, Rape, and Kick questionnaire    Fear of Current or Ex-Partner: No    Emotionally Abused: No    Physically Abused: No    Sexually Abused: No    Outpatient Medications Prior to Visit  Medication Sig Dispense Refill   carbidopa-levodopa (SINEMET IR) 25-100 MG tablet Take up to 2 tablets by mouth three times daily  Per schedule given 540 tablet 1   Dextromethorphan-quiNIDine (NUEDEXTA) 20-10 MG capsule Take 1 capsule by mouth 2 (two) times daily. 60 capsule 2   No facility-administered medications prior to visit.    Allergies  Allergen Reactions   Ace Inhibitors Cough   Imdur [Isosorbide Dinitrate] Other (See Comments)    Headache    Review of Systems  Constitutional:  Negative for appetite change, chills, fatigue and fever.  HENT:  Negative for congestion, dental problem, ear pain and sore throat.   Eyes:  Negative for discharge, redness and visual disturbance.  Respiratory:  Negative for cough, chest tightness, shortness of breath and wheezing.   Cardiovascular:  Negative for chest pain, palpitations and leg swelling.  Gastrointestinal:  Negative for abdominal pain, blood in stool, diarrhea, nausea and vomiting.  Genitourinary:  Negative for difficulty urinating, dysuria, flank pain, frequency, hematuria and urgency.  Musculoskeletal:  Negative for arthralgias, back pain, joint swelling, myalgias and neck stiffness.  Skin:  Negative for pallor and rash.  Neurological:  Positive for weakness (generalized). Negative for dizziness, speech difficulty and headaches.       Gait instability and dysarthria  Hematological:  Negative for adenopathy. Does not bruise/bleed easily.  Psychiatric/Behavioral:  Negative for confusion and sleep disturbance. The patient is nervous/anxious.     PE;    05/03/2023    2:38 PM 03/06/2023    1:53 PM 01/30/2023    9:30 AM  Vitals with BMI  Height 5\' 9"  5\' 9"     Weight 169  lbs 3 oz 165 lbs   BMI 24.98 24.36   Systolic 139 132 161  Diastolic 80 76 69  Pulse 57 59 53   Gen: Alert, well appearing.  Patient is oriented to person, place, time, and situation. Sitting in WC.   No tremor.  FNP normal bilat.  Gait not observed.    AFFECT: pleasant, lucid thought and speech. CV: RRR, no m/r/g.   LUNGS: CTA bilat, nonlabored resps, good aeration in all lung fields. EXT: no clubbing or cyanosis.  no edema.   Pertinent labs:  Lab Results  Component Value Date   TSH 3.373 09/03/2022   Lab Results  Component Value Date   WBC 5.7 09/03/2022   HGB 14.3 09/03/2022   HCT 43.2 09/03/2022   MCV 102.0 (H) 09/03/2022   PLT 120 (L) 09/03/2022   Lab Results  Component Value Date   CREATININE 1.02 09/03/2022   BUN 22 09/03/2022   NA 136 09/03/2022   K 3.9 09/03/2022   CL 99 09/03/2022   CO2 26 09/03/2022   Lab Results  Component Value Date   ALT 26 09/03/2022   AST 23 09/03/2022   ALKPHOS 86 09/03/2022   BILITOT 0.7 09/03/2022   Lab Results  Component Value Date   CHOL 163 09/03/2022   Lab Results  Component Value Date   HDL 50 09/03/2022   Lab Results  Component Value Date   LDLCALC 92 09/03/2022   Lab Results  Component Value Date   TRIG 104 09/03/2022   Lab Results  Component Value Date   CHOLHDL 3.3 09/03/2022   Lab Results  Component Value Date   PSA <0.015 05/10/2022   PSA <0.015 05/19/2021   PSA <0.015 05/27/2020   Lab Results  Component Value Date   HGBA1C 5.2 09/04/2022   ASSESSMENT AND PLAN:    #1 preventative health care: Vaccines: ALL UTD. Labs: cbc w/diff, cmet, flp. Prostate ca screening: pt w/hx of prostate ca, gets PSA surveillance by urologist but request that I check his PSA today for convenience. PSA drawn today. Colon ca screening: History of hyperplastic polyp on 2013 colonoscopy.  No further screening is indicated.  #2 history of hypertension. Mild elevated blood pressure today. He has chosen  to defer medications.  #3 hypercholesterolemia, last lipid check about 8 months ago was pretty good but LDL still not to goal of less than 70. He chooses to defer any medication for this.  #4 progressive generalized weakness with gait abnormality and dysarthria. Extensive workup with Dr. Loleta Chance. He is status post second opinion with [facility]. Current diagnosis is corticobasal syndrome. His Sinemet dose was increased and he is now currently taking 2 tablets 3 times a day but he cannot tell any difference yet. He will be starting home health PT, OT, and speech therapy next month.  An After Visit Summary was printed and given to the patient.  FOLLOW UP:  Return in about 3 months (around 08/01/2023) for routine chronic illness f/u.  Signed:  Santiago Bumpers, MD           05/03/2023

## 2023-05-06 ENCOUNTER — Other Ambulatory Visit: Payer: Medicare Other

## 2023-05-06 ENCOUNTER — Other Ambulatory Visit: Payer: Self-pay

## 2023-05-06 DIAGNOSIS — Z125 Encounter for screening for malignant neoplasm of prostate: Secondary | ICD-10-CM

## 2023-05-06 DIAGNOSIS — C61 Malignant neoplasm of prostate: Secondary | ICD-10-CM

## 2023-05-06 NOTE — Progress Notes (Signed)
[facility] requested lab change to [facility]  due to time frame of specimen. They can not run test but can send out to [facility]

## 2023-05-07 ENCOUNTER — Telehealth: Payer: Self-pay

## 2023-05-07 ENCOUNTER — Encounter: Payer: Self-pay | Admitting: Family Medicine

## 2023-05-07 DIAGNOSIS — G319 Degenerative disease of nervous system, unspecified: Secondary | ICD-10-CM

## 2023-05-07 DIAGNOSIS — R2681 Unsteadiness on feet: Secondary | ICD-10-CM

## 2023-05-07 DIAGNOSIS — R531 Weakness: Secondary | ICD-10-CM

## 2023-05-07 DIAGNOSIS — R471 Dysarthria and anarthria: Secondary | ICD-10-CM

## 2023-05-07 LAB — PSA: PSA: <0.04 ng/mL (ref <=4.00)

## 2023-05-07 NOTE — Telephone Encounter (Signed)
See telephone encounter.

## 2023-05-07 NOTE — Telephone Encounter (Signed)
 Copied from CRM [id number]. Topic: General - Other >> May 07, 2023 12:10 PM Victoria A wrote: Reason for CRM: Patients wife called and asked if there could be an Order sent to [facility] for Physical, Occupational and Speech Therapy for her husband.

## 2023-05-07 NOTE — Telephone Encounter (Addendum)
 Please advise:  Hi Dr. McGowen,   After calling several home health agency it is impossible to have PT, OT and speech therapy in the home.   Could you send a referral to [facility] for PT, OT and speech therapy? Thank you for your assistance.   [name]

## 2023-05-08 NOTE — Telephone Encounter (Signed)
[place], referral ordered 

## 2023-05-08 NOTE — Addendum Note (Signed)
 Addended by: Jeoffrey Massed on: 05/08/2023 08:35 PM   Modules accepted: Orders

## 2023-05-21 ENCOUNTER — Encounter: Payer: Self-pay | Admitting: Family Medicine

## 2023-05-21 ENCOUNTER — Telehealth: Payer: Self-pay | Admitting: Family Medicine

## 2023-05-21 NOTE — Telephone Encounter (Unsigned)
 Copied from CRM [id number]. Topic: Clinical - Medication Question >> May 21, 2023 11:12 AM Rolin D wrote: Reason for CRM: Patient would like to change his prescription carbidopa -levodopa  (SINEMET  IR) 25-100 MG tablet which helps his BP Called patient. Patient states when he takes a lot of the medication he gets panic attacks

## 2023-05-21 NOTE — Telephone Encounter (Signed)
 Copied from CRM [id number]. Topic: Clinical - Medication Question >> May 21, 2023 11:12 AM Rolin D wrote: Reason for CRM: Patient would like to change his prescription carbidopa -levodopa  (SINEMET  IR) 25-100 MG tablet which helps his BP Called patient. Patient states when he takes a lot of the medication he gets panic attacks. Patient would like someone to call him back in regards to changing medication

## 2023-05-22 ENCOUNTER — Other Ambulatory Visit: Payer: Self-pay

## 2023-05-22 ENCOUNTER — Ambulatory Visit: Payer: Commercial Managed Care - PPO

## 2023-05-22 ENCOUNTER — Ambulatory Visit: Payer: Commercial Managed Care - PPO | Attending: Family Medicine

## 2023-05-22 DIAGNOSIS — R471 Dysarthria and anarthria: Secondary | ICD-10-CM | POA: Diagnosis not present

## 2023-05-22 DIAGNOSIS — M6281 Muscle weakness (generalized): Secondary | ICD-10-CM | POA: Insufficient documentation

## 2023-05-22 DIAGNOSIS — R262 Difficulty in walking, not elsewhere classified: Secondary | ICD-10-CM | POA: Insufficient documentation

## 2023-05-22 DIAGNOSIS — G319 Degenerative disease of nervous system, unspecified: Secondary | ICD-10-CM | POA: Diagnosis not present

## 2023-05-22 DIAGNOSIS — R278 Other lack of coordination: Secondary | ICD-10-CM | POA: Insufficient documentation

## 2023-05-22 DIAGNOSIS — R2681 Unsteadiness on feet: Secondary | ICD-10-CM | POA: Insufficient documentation

## 2023-05-22 DIAGNOSIS — R2689 Other abnormalities of gait and mobility: Secondary | ICD-10-CM | POA: Diagnosis not present

## 2023-05-22 DIAGNOSIS — R531 Weakness: Secondary | ICD-10-CM | POA: Diagnosis not present

## 2023-05-22 NOTE — Therapy (Signed)
OUTPATIENT SPEECH LANGUAGE PATHOLOGY [facility]   Patient Name: William Montgomery MRN: [id number] DOB:11-Apr-1941, 83 y.o., male Today's Date: 05/22/2023  PCP: Karle Plumber, MD REFERRING PROVIDER: Jeoffrey Massed, MD  END OF SESSION:  End of Session - 05/22/23 2350     Visit Number 1    Number of Visits 17    Date for SLP Re-Evaluation 07/21/23    SLP Start Time 1619    SLP Stop Time  1705    SLP Time Calculation (min) 46 min    Activity Tolerance Patient tolerated treatment well             Past Medical History:  Diagnosis Date   Arthritis    joints/  back/  neck   Chronic headaches    due to cervical spondylosis   Chronic renal insufficiency, stage 2 (mild)    borderlines II/III   Coronary artery disease 1999   cardiologist--- dr Antoine Poche---  (per cardiology note pt refused to take asa/ statin) hx PCI w/ stenting in 1999 to LAD and 12-23-2007 x2 DES to proximal LAD;  Cath 03/2017-->  non- critical 70 - 80% mid/distal circ stenosis,  chronic small D1 80% stenosis (stable) and DES to proximal and mid LAD--   Med mgmt rec'd-->nitrates caused HAs, ACE-I caused cough.   Corticobasal syndrome (HCC)    [facility] 04/2023   DDD (degenerative disc disease)    lumbar and cervical neck   Diverticulosis of sigmoid colon 07/25/2011   Severe (endoscopy by Dr. Jarold Motto)   Erectile dysfunction after radical prostatectomy    History of kidney stones    History of prostate cancer 12/2014   urologist-- dr Retta Diones---   s/p Robot assisted radical prostatectomy 07/2015: lymph node involvement//   undetactable PSA since   HTN (hypertension)    ACE-I cough.  Fine on ARB.   Hx of blastomycosis 1978   s/p  right upper lobectomy   Hyperlipidemia    NASH (nonalcoholic steatohepatitis) 06/2011   normal LFTS  /  Hep B and C testing neg.  Mild fatty liver on CT 2020   Nocturia    Parkinsonism (HCC)    2023/24, unknown etiology (Dat scan neg), peripheral nerve bx, and  spinal fluid testing all NEG/Norma. [facility] 2nd opinion-->corticobasal syndrome 04/2023)   Pseudobulbar affect    Pulmonary nodules    small, picked up on CT ab/pelv for abd pains/flank pains-->pt quit smoking 40 yrs ago.  Low risk for lung ca so no f/u CT needed.   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   Tinnitus    Wears glasses    Wears hearing aid in both ears    Past Surgical History:  Procedure Laterality Date   ANTERIOR CERVICAL DECOMP/DISCECTOMY FUSION  1982   C6--7   APPENDECTOMY  1953   CARDIAC CATHETERIZATION  02/27/2000   @[facility]  by dr hochrein ---  single vessel w/ preserved LVSF   CORONARY ANGIOPLASTY WITH STENT PLACEMENT  12/23/2007   by Dr End--- PCI w/ DES x2 to  pLAD   CORONARY STENT PLACEMENT  1999   PTCA w/ stenting to LAd   CYSTOSCOPY WITH RETROGRADE PYELOGRAM, URETEROSCOPY AND STENT PLACEMENT Left 05/10/2018   Procedure: CYSTOSCOPY WITH LEFT RETROGRADE PYELOGRAM, LEFT URETEROSCOPY AND LEFT URETERAL  STENT PLACEMENT;  Surgeon: Sebastian Ache, MD;  Location: [facility];  Service: Urology;  Laterality: Left;   HOLMIUM LASER APPLICATION Left 05/10/2018   Procedure: HOLMIUM LASER APPLICATION;  Surgeon: Sebastian Ache, MD;  Location: [facility];  Service: Urology;  Laterality: Left;   INCISIONAL HERNIA REPAIR N/A 03/19/2019   Procedure: LAPAROSCOPIC INCISIONAL HERNIA REPAIR WITH MESH, RECURRENT UMBILICAL HERNIA REPAIR;  Surgeon: Karie Soda, MD;  Location: [facility];  Service: General;  Laterality: N/A;   INGUINAL HERNIA REPAIR Right 03/19/2019   Procedure: LAPAROSCOPIC BILATERAL FEMORAL AND INGUINAL HERNIA REPAIR WITH MESH, LYSIS OF ADHESIONS;  Surgeon: Karie Soda, MD;  Location: [facility];  Service: General;  Laterality: Right;   LEFT HEART CATH AND CORONARY ANGIOGRAPHY N/A 03/21/2017   Stents patent; mod circ  dz, nothing for intervention--imdur added.  Procedure: LEFT HEART CATH AND CORONARY ANGIOGRAPHY;  Surgeon: Yvonne Kendall, MD;  Location: [facility];  Service: Cardiovascular;  Laterality: N/A;   LUMBAR SPINE SURGERY  1978   L5--S1   LUNG LOBECTOMY Right 1978   right upper lobectomy for blastomycosis   LYMPHADENECTOMY Bilateral 07/14/2015   Procedure: LYMPHADENECTOMY;  Surgeon: Heloise Purpura, MD;  Location: [facility];  Service: Urology;  Laterality: Bilateral;   ROBOT ASSISTED LAPAROSCOPIC RADICAL PROSTATECTOMY N/A 07/14/2015   One positive pelvic LN.  Procedure: XI ROBOTIC ASSISTED LAPAROSCOPIC RADICAL PROSTATECTOMY LEVEL 2;  Surgeon: Heloise Purpura, MD;  Location: [facility];  Service: Urology;  Laterality: N/A;   ROTATOR CUFF REPAIR  2007   UMBILICAL HERNIA REPAIR  07/14/2015   Procedure: HERNIA REPAIR UMBILICAL ADULT;  Surgeon: Heloise Purpura, MD;  Location: [facility];  Service: Urology;;   Patient Active Problem List   Diagnosis Date Noted   Generalized weakness 09/03/2022   Word finding difficulty 09/03/2022   Worsened handwriting 09/03/2022   Proximal muscle weakness 09/03/2022   Incisional hernia s/p lap repair with mesh 03/19/2019 03/19/2019   Recurrent umbilical hernia s/p lap repair with mesh 03/19/2019 03/19/2019   Bilateral inguinal hernia s/p lap repair with mesh 03/19/2019 03/19/2019   Bilateral femoral hernia s/p lap repair with mesh 03/19/2019 03/19/2019   Overweight (BMI 25.0-29.9) 11/28/2018   Accelerating angina (HCC) 03/21/2017   Elevated blood pressure reading without diagnosis of hypertension 09/23/2015   Prostate cancer (HCC) 07/14/2015   Preventative health care 03/19/2014   Insomnia 03/19/2014   Benign prostatic hyperplasia 09/15/2012   Chronic low back pain 10/15/2011   Gallbladder polyp 10/15/2011   Need for shingles vaccine 10/15/2011   Special screening for malignant neoplasms, colon 07/25/2011   History of colonic polyps 07/25/2011   Benign neoplasm of  colon 07/25/2011   Diverticulosis of colon 07/25/2011   Colon cancer screening 07/12/2011   Hx of colonic polyp 07/12/2011   CAD (coronary artery disease) 07/12/2011   Low back pain 02/13/2011   Prostate cancer screening 02/13/2011   HTN (hypertension) 01/03/2011   Health maintenance examination 10/13/2010   Transaminasemia 10/13/2010   Hyperlipidemia 11/06/2008   Coronary atherosclerosis 11/06/2008   DEGENERATIVE DISC DISEASE 11/06/2008    ONSET DATE: approx one year ago  REFERRING DIAG: R47.1 (ICD-10-CM) - Dysarthria  THERAPY DIAG:  Dysarthria and anarthria  Rationale for Evaluation and Treatment: Rehabilitation  SUBJECTIVE:   SUBJECTIVE STATEMENT: " Pt accompanied by: significant other  PERTINENT HISTORY: History of hypertension, hyperlipidemia, and coronary artery disease. 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 Was recently evaluated at [facility] and concluded corticobasal  degeneration. His Sinemet dose was increased and he is now currently taking 2 tablets 3 times a day but he cannot tell any difference.   PAIN:  Are you having pain? No  FALLS: Has patient fallen in last 6 months?  Yes-multiple. See PT eval.   LIVING ENVIRONMENT: Lives with: lives with their spouse Lives in: House/apartment  PLOF:  Level of assistance: Independent with ADLs, Independent with IADLs, Comment: needs assist at times due to fatigue Employment: Retired  PATIENT GOALS: Improve speech  OBJECTIVE:  Note: Objective measures were completed at Evaluation unless otherwise noted.  DIAGNOSTIC FINDINGS:  Brain imaging has proved WNL  COGNITION: Overall cognitive status: Within functional limits for tasks assessed  MOTOR SPEECH: Overall motor speech: impaired Level of impairment: Word, Phrase, Sentence, and Conversation Respiration: thoracic breathing Phonation: low vocal intensity; average 68dB in conversation Resonance:  WFL Articulation: Impaired: word, phrase, and conversation Intelligibility: Intelligibility reduced; 90% for this trained listener Motor planning: Appears intact Motor speech errors: aware and inconsistent Interfering components:  Effective technique: slow rate and over articulate  ORAL MOTOR EXAMINATION: Overall status: Impaired: Labial: Bilateral (Coordination) Lingual: Bilateral (Coordination) Comments: Pt denies any swallowing difficulty with meals. SLP to monitor.   Completed audio recording of patients baseline voice without cueing from SLP: Yes  Pt does not report difficulty with swallowing which does not warrant further evaluation.  PATIENT REPORTED OUTCOME MEASURES (PROM): Communication Effectiveness Survey: to be provided in first 2 sessions                                                                                                                            TREATMENT DATE:   05/22/23: SLP introduced concept of overarticulation and worked with pt in reading and in spontaneous speech (sentence completion). Pt's wife stated along with pt that pt's speech was noticably improved when pt was exaggerating speech sounds. SLP provided homework for pt.  PATIENT EDUCATION: Education details: see "treatment note" Person educated: Patient and Spouse Education method: Explanation, Demonstration, and Verbal cues Education comprehension: verbalized understanding, returned demonstration, verbal cues required, and needs further education  HOME EXERCISE PROGRAM: Practice overarticulated speech in reading and with handouts provided today   GOALS: Goals reviewed with patient? Yes  SHORT TERM GOALS: Target date: 06/22/23  Pt will demo speech compensations in sentence responses 80% in 2 sessions Baseline: Goal status: INITIAL  2.  Pt will demo speech compensations in 10 minute simple conversation in 2 sessions Baseline:  Goal status: INITIAL  3.  Pt and/or wife will be able to  ID overt s/sx pharyngeal dysphagia with modified independence Baseline:  Goal status: INITIAL    LONG TERM GOALS: Target date: 07/21/23  Pt will improve PROM compared to initial administration Baseline:  Goal status: INITIAL  2.  Pt will demo speech compensations in 10 minute simple-mod complex conversation in 3 sessions Baseline:  Goal status: INITIAL  3.  Pt will indicate that wife and other family and  friends have decr'd frequency of request for repeat Baseline:  Goal status: INITIAL    ASSESSMENT:  CLINICAL IMPRESSION: Patient is a 83 y.o. M who was seen today for assessment of speech intelligiblity in light of suspected CBD/syndrome. Pt told SLP that his speech was more intelligible when he used overarticulation/exaggeration of speech musculature.   OBJECTIVE IMPAIRMENTS: Objective impairments include dysarthria. These impairments are limiting patient from household responsibilities, ADLs/IADLs, and effectively communicating at home and in community.Factors affecting potential to achieve goals and functional outcome are medical prognosis and severity of impairments.. Patient will benefit from skilled SLP services to address above impairments and improve overall function.  REHAB POTENTIAL: Good  PLAN:  SLP FREQUENCY: 2x/week  SLP DURATION: 8 weeks  PLANNED INTERVENTIONS: Environmental controls, Cueing hierachy, Internal/external aids, Oral motor exercises, Functional tasks, SLP instruction and feedback, Compensatory strategies, Patient/family education, and 52841 Treatment of speech (30 or 45 min)     Jalonda Antigua, CCC-SLP 05/22/2023, 11:51 PM

## 2023-05-22 NOTE — Therapy (Signed)
 OUTPATIENT PHYSICAL THERAPY NEURO EVALUATION   Patient Name: William Montgomery MRN: [id number] DOB:May 17, 1940, 83 y.o., male Today's Date: 05/22/2023   PCP: Shelvia Dick, MD REFERRING PROVIDER: Shelvia Dick, MD  END OF SESSION:  PT End of Session - 05/22/23 1504     Visit Number 1    Number of Visits 9    Date for PT Re-Evaluation 07/24/23    Authorization Type Medicare A/B    Progress Note Due on Visit 10    PT Start Time 1510    PT Stop Time 1555    PT Time Calculation (min) 45 min             Past Medical History:  Diagnosis Date   Arthritis    joints/  back/  neck   Chronic headaches    due to cervical spondylosis   Chronic renal insufficiency, stage 2 (mild)    borderlines II/III   Coronary artery disease 1999   cardiologist--- dr Lavonne Prairie---  (per cardiology note pt refused to take asa/ statin) hx PCI w/ stenting in 1999 to LAD and 12-23-2007 x2 DES to proximal LAD;  Cath 03/2017-->  non- critical 70 - 80% mid/distal circ stenosis,  chronic small D1 80% stenosis (stable) and DES to proximal and mid LAD--   Med mgmt rec'd-->nitrates caused HAs, ACE-I caused cough.   Corticobasal syndrome (HCC)    [facility] 04/2023   DDD (degenerative disc disease)    lumbar and cervical neck   Diverticulosis of sigmoid colon 07/25/2011   Severe (endoscopy by Dr. Adan Holms)   Erectile dysfunction after radical prostatectomy    History of kidney stones    History of prostate cancer 12/2014   urologist-- dr Joie Narrow---   s/p Robot assisted radical prostatectomy 07/2015: lymph node involvement//   undetactable PSA since   HTN (hypertension)    ACE-I cough.  Fine on ARB.   Hx of blastomycosis 1978   s/p  right upper lobectomy   Hyperlipidemia    NASH (nonalcoholic steatohepatitis) 06/2011   normal LFTS  /  Hep B and C testing neg.  Mild fatty liver on CT 2020   Nocturia    Parkinsonism (HCC)    2023/24, unknown etiology (Dat scan neg), peripheral nerve bx, and  spinal fluid testing all NEG/Norma. [facility] 2nd opinion-->corticobasal syndrome 04/2023)   Pseudobulbar affect    Pulmonary nodules    small, picked up on CT ab/pelv for abd pains/flank pains-->pt quit smoking 40 yrs ago.  Low risk for lung ca so no f/u CT needed.   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   Tinnitus    Wears glasses    Wears hearing aid in both ears    Past Surgical History:  Procedure Laterality Date   ANTERIOR CERVICAL DECOMP/DISCECTOMY FUSION  1982   C6--7   APPENDECTOMY  1953   CARDIAC CATHETERIZATION  02/27/2000   @[facility]  by dr hochrein ---  single vessel w/ preserved LVSF   CORONARY ANGIOPLASTY WITH STENT PLACEMENT  12/23/2007   by Dr End--- PCI w/ DES x2 to  pLAD   CORONARY STENT PLACEMENT  1999   PTCA w/ stenting to LAd   CYSTOSCOPY WITH RETROGRADE PYELOGRAM, URETEROSCOPY AND STENT PLACEMENT Left 05/10/2018   Procedure: CYSTOSCOPY WITH LEFT  RETROGRADE PYELOGRAM, LEFT URETEROSCOPY AND LEFT URETERAL STENT PLACEMENT;  Surgeon: Osborn Blaze, MD;  Location: [facility];  Service: Urology;  Laterality: Left;   HOLMIUM LASER APPLICATION Left 05/10/2018   Procedure: HOLMIUM LASER APPLICATION;  Surgeon: Osborn Blaze, MD;  Location: [facility];  Service: Urology;  Laterality: Left;   INCISIONAL HERNIA REPAIR N/A 03/19/2019   Procedure: LAPAROSCOPIC INCISIONAL HERNIA REPAIR WITH MESH, RECURRENT UMBILICAL HERNIA REPAIR;  Surgeon: Candyce Champagne, MD;  Location: [facility];  Service: General;  Laterality: N/A;   INGUINAL HERNIA REPAIR Right 03/19/2019   Procedure: LAPAROSCOPIC BILATERAL FEMORAL AND INGUINAL HERNIA REPAIR WITH MESH, LYSIS OF ADHESIONS;  Surgeon: Candyce Champagne, MD;  Location: [facility];  Service: General;  Laterality: Right;   LEFT HEART CATH AND CORONARY ANGIOGRAPHY N/A 03/21/2017   Stents patent; mod circ  dz, nothing for intervention--imdur  added.  Procedure: LEFT HEART CATH AND CORONARY ANGIOGRAPHY;  Surgeon: Sammy Crisp, MD;  [facility]: [facility];  Service: Cardiovascular;  Laterality: N/A;   LUMBAR SPINE SURGERY  1978   L5--S1   LUNG LOBECTOMY Right 1978   right upper lobectomy for blastomycosis   LYMPHADENECTOMY Bilateral 07/14/2015   Procedure: LYMPHADENECTOMY;  Surgeon: Florencio Hunting, MD;  Location: [facility];  Service: Urology;  Laterality: Bilateral;   ROBOT ASSISTED LAPAROSCOPIC RADICAL PROSTATECTOMY N/A 07/14/2015   One positive pelvic LN.  Procedure: XI ROBOTIC ASSISTED LAPAROSCOPIC RADICAL PROSTATECTOMY LEVEL 2;  Surgeon: Florencio Hunting, MD;  [facility]: [facility];  Service: Urology;  Laterality: N/A;   ROTATOR CUFF REPAIR  2007   UMBILICAL HERNIA REPAIR  07/14/2015   Procedure: HERNIA REPAIR UMBILICAL ADULT;  Surgeon: Florencio Hunting, MD;  Location: [facility];  Service: Urology;;   Patient Active Problem List   Diagnosis Date Noted   Generalized weakness 09/03/2022   Word finding difficulty 09/03/2022   Worsened handwriting 09/03/2022   Proximal muscle weakness 09/03/2022   Incisional hernia s/p lap repair with mesh 03/19/2019 03/19/2019   Recurrent umbilical hernia s/p lap repair with mesh 03/19/2019 03/19/2019   Bilateral inguinal hernia s/p lap repair with mesh 03/19/2019 03/19/2019   Bilateral femoral hernia s/p lap repair with mesh 03/19/2019 03/19/2019   Overweight (BMI 25.0-29.9) 11/28/2018   Accelerating angina (HCC) 03/21/2017   Elevated blood pressure reading without diagnosis of hypertension 09/23/2015   Prostate cancer (HCC) 07/14/2015   Preventative health care 03/19/2014   Insomnia 03/19/2014   Benign prostatic hyperplasia 09/15/2012   Chronic low back pain 10/15/2011   Gallbladder polyp 10/15/2011   Need for shingles vaccine 10/15/2011   Special screening for malignant neoplasms, colon 07/25/2011   History of colonic polyps 07/25/2011   Benign neoplasm of  colon 07/25/2011   Diverticulosis of colon 07/25/2011   Colon cancer screening 07/12/2011   Hx of colonic polyp 07/12/2011   CAD (coronary artery disease) 07/12/2011   Low back pain 02/13/2011   Prostate cancer screening 02/13/2011   HTN (hypertension) 01/03/2011   Health maintenance examination 10/13/2010   Transaminasemia 10/13/2010   Hyperlipidemia 11/06/2008   Coronary atherosclerosis 11/06/2008   DEGENERATIVE DISC DISEASE 11/06/2008    ONSET DATE: x 1 year  REFERRING DIAG: R53.1 (ICD-10-CM) - Generalized weakness R47.1 (ICD-10-CM) - Dysarthria R26.81 (ICD-10-CM) - Unsteady gait 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  Was recently evaluated at [facility] and concluded corticobasal degeneration.  Notes worsening ability to transfer sit to stand, difficulty to walk, and deficits with bed mobility.  Reports severe balance deficits even falling over if sitting in a chair without arm rests. Denies any dizziness but notes instances of retropulsion and feet stay stuck to ground reuqiring hand hold assist to move.  Freezing of gait with transitions in floor surfaces/patterns, freezing in doorways, freezing with turns.  Multiple falls a week Pt accompanied by: significant other  PERTINENT HISTORY: progressive generalized weakness with gait abnormality and dysarthria. Extensive workup with Dr. Genita Keys. He is status post second opinion with [facility]. Current diagnosis is corticobasal syndrome. His Sinemet  dose was increased and he is now currently taking 2 tablets 3 times a day but he cannot tell any difference yet underwent second opinion neurologic evaluation at the [facility] division of movement disorders on 04/17/2023. 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  History of hypertension, hyperlipidemia, and coronary artery disease  PAIN:  Are you having pain? No, Right shoulder can cause pain with transfers  PRECAUTIONS: Fall  RED FLAGS: None   WEIGHT BEARING RESTRICTIONS: No  FALLS: Has patient fallen in last 6 months? Yes. Number of falls multiple falls week  LIVING ENVIRONMENT: Lives with: lives with their spouse Lives in: House/apartment Stairs: Yes: Internal: 2 level steps; bilateral but cannot reach both and External: 6 steps; bilateral but cannot reach both Has following equipment at home: Otho Blitz - 2 wheeled, Wheelchair (manual), shower chair, and Grab bars, recliner lift chair, Chair lift to 2nd floor  PLOF: Independent with basic ADLs and able to dress self in the AM needs assist in PM due to fatigue.  Notes difficulty with transferring into shower, Sleep Number bed  PATIENT GOALS: "move around better"  OBJECTIVE:  Note: Objective measures were completed at Evaluation unless otherwise noted.  DIAGNOSTIC FINDINGS: IMPRESSION: 1. No acute intracranial abnormality. Small left temporal scalp contusion. 2. No acute cervical spine fracture or traumatic malalignment.  COGNITION: Overall cognitive status:  notes his memory seems intact   SENSATION: Not tested  COORDINATION: Deficits with rapid alternating movements Heel to shin impaired RLE > LLE Finger to nose moderately impaired  EDEMA:  none  MUSCLE TONE: NT    POSTURE: forward head  LOWER EXTREMITY ROM:     Active  Right Eval Left Eval  Hip flexion 110 110  Hip extension    Hip abduction    Hip adduction    Hip internal rotation    Hip  external rotation    Knee flexion 120 120  Knee extension 0 0  Ankle dorsiflexion 15 15  Ankle plantarflexion 30 30  Ankle inversion    Ankle eversion     (Blank rows = not tested)  LOWER EXTREMITY MMT:  (resisted tests in sitting)  MMT Right Eval Left Eval  Hip flexion 4 5  Hip extension    Hip abduction 4 5  Hip adduction 5 5  Hip internal rotation    Hip external rotation    Knee flexion 4 5  Knee extension 5 5  Ankle dorsiflexion 5 5  Ankle plantarflexion    Ankle inversion    Ankle eversion    (Blank rows = not tested)  BED MOBILITY:  Sit to supine Min A Supine to sit Min A  TRANSFERS: Assistive device  utilized: Environmental consultant - 2 wheeled  Sit to stand: CGA and Min A Stand to sit: CGA Chair to chair: CGA Floor:  NT    CURB:  Level of Assistance: Min A Assistive device utilized: Environmental consultant - 2 wheeled Curb Comments:   STAIRS: NT--reports use of BHR and 6 stairs for entering/exiting home w/ spouse SBA-CGA  GAIT: Gait pattern: decreased step length- Right, decreased step length- Left, festinating, wide BOS, and abducted- Right Distance walked: 75 Assistive device utilized: Walker - 2 wheeled Level of assistance: CGA and Min A Comments:   FUNCTIONAL TESTS:  Timed up and go (TUG): TBD 10 meter walk test: 1 min 16 sec (76 sec) = 0.43 ft/sec Berg Balance Scale: 23/56  Static standing: Fair- 180 degree turns: 30 sec 360 degree turns: unable                                                                                                                   PATIENT EDUCATION: Education details: assessment details, rationale of PT intervention (restoration vs adaptation/compensation) Person educated: Patient and Spouse Education method: Explanation Education comprehension: verbalized understanding  HOME EXERCISE PROGRAM: TBD  GOALS: Goals reviewed with patient? Yes  SHORT TERM GOALS: Target date: 06/19/2023    Patient will be independent in HEP to improve  functional outcomes Baseline: Goal status: INITIAL  2.  Demo/teach-back principles to reduce freezing of gait to improve safety with turning during gait Baseline: 30 sec to negotiate 180 degree turn Goal status: INITIAL  3.  Demo supervision for bed mobility to reduce level of assistance from caregiver Baseline: min-mod A Goal status: INITIAL  4.  Demo squat-pivot and sit-sit transfers from w/c to seated surfaces w/ supervision to improve safety with mobility Baseline: CGA-min A stand-pivot Goal status: INITIAL    LONG TERM GOALS: Target date: 07/24/2023    Demo improved balance and reduced risk for falls as evidenced by score 30/56 Berg Balance Test Baseline: 23/56 Goal status: INITIAL  2.  Demo ability to ambulate x 150 ft with supervision using least restrictive AD to improve safety with household ambulation Baseline: CGA-min A x 75 ft w/ RW Goal status: INITIAL  3.  Caregiver to demonstrate alternative transfer techniques to improve safety with patient mobility Baseline: e.g. slideboard, transfer disc, slick sheet, etc Goal status: INITIAL    ASSESSMENT:  CLINICAL IMPRESSION: Patient is a 83 y.o. male who was seen today for physical therapy evaluation and treatment for mobility impairments related to neurologic disease.  Presently, experiencing multiple falls a week and requiring increased level of physical assistance for functional mobility.  Demo profound motor control and coordination deficits affecting balance, postural stability, and gait deviations with freezing of gait, festination, and difficulty with negotiating turns. High risk for falls per history and outcome measures.  Patient would benefit from PT interventions to provide relevant strategies and instruction in restorative, compensatory, and adaptive techniques.   OBJECTIVE IMPAIRMENTS: Abnormal gait, decreased activity tolerance, decreased balance, decreased coordination, decreased endurance, decreased  knowledge of use  of DME, decreased mobility, difficulty walking, decreased strength, impaired UE functional use, and postural dysfunction.   ACTIVITY LIMITATIONS: carrying, lifting, bending, sitting, standing, stairs, transfers, bed mobility, reach over head, and locomotion level  PARTICIPATION LIMITATIONS: cleaning, interpersonal relationship, shopping, community activity, and household activities  PERSONAL FACTORS: Age and Time since onset of injury/illness/exacerbation are also affecting patient's functional outcome.   REHAB POTENTIAL: Good  CLINICAL DECISION MAKING: Evolving/moderate complexity  EVALUATION COMPLEXITY: Moderate  PLAN:  PT FREQUENCY: 1x/week  PT DURATION: 8 weeks  PLANNED INTERVENTIONS: 97110-Therapeutic exercises, 97530- Therapeutic activity, 97112- Neuromuscular re-education, [phone]- Self Care, 91478- Manual therapy, [phone]- Gait training, [phone]- Orthotic Fit/training, [phone]- Canalith repositioning, [phone]- Aquatic Therapy, Patient/Family education, Balance training, Stair training, Joint mobilization, Spinal mobilization, Vestibular training, DME instructions, Wheelchair mobility training, Cryotherapy, and Moist heat  PLAN FOR NEXT SESSION: gait trials with U-step, freezing of gait techniques, slideboard transfer with spouse, squat-stand-pivot transfers w/ slide on foot? Initiate activities for HEP   4:42 PM, 05/22/23 M. Kelly Beckhem Isadore, PT, DPT Physical Therapist- [place] Office Number: [phone]

## 2023-05-22 NOTE — Telephone Encounter (Signed)
 MyChart sent.

## 2023-05-23 ENCOUNTER — Encounter: Payer: Self-pay | Admitting: Family Medicine

## 2023-05-23 ENCOUNTER — Ambulatory Visit: Payer: Commercial Managed Care - PPO | Admitting: Family Medicine

## 2023-05-23 ENCOUNTER — Other Ambulatory Visit (HOSPITAL_BASED_OUTPATIENT_CLINIC_OR_DEPARTMENT_OTHER): Payer: Self-pay

## 2023-05-23 VITALS — BP 117/75 | HR 60 | Wt 169.0 lb

## 2023-05-23 DIAGNOSIS — F418 Other specified anxiety disorders: Secondary | ICD-10-CM

## 2023-05-23 MED ORDER — ALPRAZOLAM 1 MG PO TABS
1.0000 mg | ORAL_TABLET | Freq: Every day | ORAL | 1 refills | Status: DC | PRN
  Filled 2023-05-23: qty 30, 30d supply, fill #0
  Filled 2023-06-24: qty 30, 30d supply, fill #1

## 2023-05-23 NOTE — Progress Notes (Signed)
OFFICE VISIT  05/23/2023  CC:  Chief Complaint  Patient presents with   Anxiety    Pt requesting alternative for anxiety. Med previously prescribed did not mix well with other prescribed meds and caused an increase in anxiety/panic attacks.     Patient is a 83 y.o. male who presents accompanied by his wife for increased anxiety/panic.  INTERIM HX: William Montgomery has periods of severe anxiety at night.  He often wakes up and feels panicky because he is unable to move very well due to his chronic neurologic condition. The symptoms gradually worsen and sometimes wax and wane through the night and affect his sleep quite a bit.  He has only an occasional similar episode in the daytime. He feels confined since he always has to be in his wheelchair in bed and this is a feeling that often leads to bad anxiety for him. He has taken alprazolam in the past as premedication for getting into an MRI machine.  He has found it helps well and has no adverse side effects for him.  Past Medical History:  Diagnosis Date   Arthritis    joints/  back/  neck   Chronic headaches    due to cervical spondylosis   Chronic renal insufficiency, stage 2 (mild)    borderlines II/III   Coronary artery disease 1999   cardiologist--- dr Antoine Poche---  (per cardiology note pt refused to take asa/ statin) hx PCI w/ stenting in 1999 to LAD and 12-23-2007 x2 DES to proximal LAD;  Cath 03/2017-->  non- critical 70 - 80% mid/distal circ stenosis,  chronic small D1 80% stenosis (stable) and DES to proximal and mid LAD--   Med mgmt rec'd-->nitrates caused HAs, ACE-I caused cough.   Corticobasal syndrome (HCC)    [facility] 04/2023   DDD (degenerative disc disease)    lumbar and cervical neck   Diverticulosis of sigmoid colon 07/25/2011   Severe (endoscopy by Dr. Jarold Motto)   Erectile dysfunction after radical prostatectomy    History of kidney stones    History of prostate cancer 12/2014   urologist-- dr Retta Diones---   s/p Robot  assisted radical prostatectomy 07/2015: lymph node involvement//   undetactable PSA since   HTN (hypertension)    ACE-I cough.  Fine on ARB.   Hx of blastomycosis 1978   s/p  right upper lobectomy   Hyperlipidemia    NASH (nonalcoholic steatohepatitis) 06/2011   normal LFTS  /  Hep B and C testing neg.  Mild fatty liver on CT 2020   Nocturia    Parkinsonism (HCC)    2023/24, unknown etiology (Dat scan neg), peripheral nerve bx, and spinal fluid testing all NEG/Norma. [facility] 2nd opinion-->corticobasal syndrome 04/2023)   Pseudobulbar affect    Pulmonary nodules    small, picked up on CT ab/pelv for abd pains/flank pains-->pt quit smoking 40 yrs ago.  Low risk for lung ca so no f/u CT needed.   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   Tinnitus    Wears glasses    Wears hearing aid in both ears     Past Surgical History:  Procedure Laterality Date   ANTERIOR CERVICAL DECOMP/DISCECTOMY FUSION  1982   C6--7   APPENDECTOMY  1953   CARDIAC CATHETERIZATION  02/27/2000   @  [facility] by dr hochrein ---  single vessel w/ preserved LVSF   CORONARY ANGIOPLASTY WITH STENT PLACEMENT  12/23/2007   by Dr End--- PCI w/ DES x2 to  pLAD   CORONARY STENT PLACEMENT  1999   PTCA w/ stenting to LAd   CYSTOSCOPY WITH RETROGRADE PYELOGRAM, URETEROSCOPY AND STENT PLACEMENT Left 05/10/2018   Procedure: CYSTOSCOPY WITH LEFT RETROGRADE PYELOGRAM, LEFT URETEROSCOPY AND LEFT URETERAL STENT PLACEMENT;  Surgeon: Sebastian Ache, MD;  [facility]: [facility];  Service: Urology;  Laterality: Left;   HOLMIUM LASER APPLICATION Left 05/10/2018   Procedure: HOLMIUM LASER APPLICATION;  Surgeon: Sebastian Ache, MD;  [facility]: [facility];  Service: Urology;  Laterality: Left;   INCISIONAL HERNIA REPAIR N/A 03/19/2019   Procedure: LAPAROSCOPIC INCISIONAL HERNIA REPAIR  WITH MESH, RECURRENT UMBILICAL HERNIA REPAIR;  Surgeon: Karie Soda, MD;  [facility];  Service: General;  Laterality: N/A;   INGUINAL HERNIA REPAIR Right 03/19/2019   Procedure: LAPAROSCOPIC BILATERAL FEMORAL AND INGUINAL HERNIA REPAIR WITH MESH, LYSIS OF ADHESIONS;  Surgeon: Karie Soda, MD;  [facility]: [facility];  Service: General;  Laterality: Right;   LEFT HEART CATH AND CORONARY ANGIOGRAPHY N/A 03/21/2017   Stents patent; mod circ dz, nothing for intervention--imdur added.  Procedure: LEFT HEART CATH AND CORONARY ANGIOGRAPHY;  Surgeon: Yvonne Kendall, MD;  [facility]: [facility];  Service: Cardiovascular;  Laterality: N/A;   LUMBAR SPINE SURGERY  1978   L5--S1   LUNG LOBECTOMY Right 1978   right upper lobectomy for blastomycosis   LYMPHADENECTOMY Bilateral 07/14/2015   Procedure: LYMPHADENECTOMY;  Surgeon: Heloise Purpura, MD;  Location: [facility];  Service: Urology;  Laterality: Bilateral;   ROBOT ASSISTED LAPAROSCOPIC RADICAL PROSTATECTOMY N/A 07/14/2015   One positive pelvic LN.  Procedure: XI ROBOTIC ASSISTED LAPAROSCOPIC RADICAL PROSTATECTOMY LEVEL 2;  Surgeon: Heloise Purpura, MD;  [facility]: [facility];  Service: Urology;  Laterality: N/A;   ROTATOR CUFF REPAIR  2007   UMBILICAL HERNIA REPAIR  07/14/2015   Procedure: HERNIA REPAIR UMBILICAL ADULT;  Surgeon: Heloise Purpura, MD;  Location: [facility];  Service: Urology;;    Outpatient Medications Prior to Visit  Medication Sig Dispense Refill   carbidopa-levodopa (SINEMET IR) 25-100 MG tablet Take up to 2 tablets by mouth three times daily  Per schedule given 540 tablet 1   Dextromethorphan-quiNIDine (NUEDEXTA) 20-10 MG capsule Take 1 capsule by mouth 2 (two) times daily. 60 capsule 2   No facility-administered medications prior to visit.    Allergies  Allergen Reactions   Ace Inhibitors Cough   Imdur [Isosorbide Dinitrate] Other (See Comments)    Headache    Review of Systems As per HPI  PE:    05/23/2023   10:04 AM  05/03/2023    2:38 PM 03/06/2023    1:53 PM  Vitals with BMI  Height  5\' 9"  5\' 9"   Weight 169 lbs 169 lbs 3 oz 165 lbs  BMI 24.95 24.98 24.36  Systolic 117 139 629  Diastolic 75 80 76  Pulse 60 57 59     Physical Exam  General: Alert and well-appearing.  Sitting in wheelchair. Affect: Pleasant.  Thought and speech content lucid. No further exam today.  LABS:  None  IMPRESSION AND PLAN:  Episodic severe situational anxiety. Propranolol caused him to feel significant fatigue and dizziness on the day after taking it. We will try Xanax 1 mg, 1 tab once a day as needed, #30, refill x 1.  An After Visit Summary was printed and given to the  patient.  FOLLOW UP: Return if symptoms worsen or fail to improve.  Signed:  Santiago Bumpers, MD           05/23/2023

## 2023-05-29 ENCOUNTER — Ambulatory Visit: Payer: Commercial Managed Care - PPO

## 2023-05-29 ENCOUNTER — Ambulatory Visit: Payer: Commercial Managed Care - PPO | Admitting: Occupational Therapy

## 2023-05-29 ENCOUNTER — Other Ambulatory Visit: Payer: Self-pay

## 2023-05-29 DIAGNOSIS — R262 Difficulty in walking, not elsewhere classified: Secondary | ICD-10-CM

## 2023-05-29 DIAGNOSIS — M6281 Muscle weakness (generalized): Secondary | ICD-10-CM

## 2023-05-29 DIAGNOSIS — R2681 Unsteadiness on feet: Secondary | ICD-10-CM

## 2023-05-29 DIAGNOSIS — G319 Degenerative disease of nervous system, unspecified: Secondary | ICD-10-CM | POA: Diagnosis not present

## 2023-05-29 DIAGNOSIS — R2689 Other abnormalities of gait and mobility: Secondary | ICD-10-CM

## 2023-05-29 DIAGNOSIS — R471 Dysarthria and anarthria: Secondary | ICD-10-CM | POA: Diagnosis not present

## 2023-05-29 DIAGNOSIS — R531 Weakness: Secondary | ICD-10-CM | POA: Diagnosis not present

## 2023-05-29 DIAGNOSIS — R278 Other lack of coordination: Secondary | ICD-10-CM | POA: Diagnosis not present

## 2023-05-29 NOTE — Therapy (Signed)
OUTPATIENT OCCUPATIONAL THERAPY [facility] EVALUATION  Patient Name: William Montgomery MRN: [id number] DOB:11-21-1940, 83 y.o., male Today's Date: 05/29/2023  PCP: Jeoffrey Massed, MD  REFERRING PROVIDER: Jeoffrey Massed, MD   END OF SESSION:  OT End of Session - 05/29/23 1408     Visit Number 1    Number of Visits 17   anticipate 9 (including eval), may increase up to 17 (including eval)   Date for OT Re-Evaluation 07/26/23    Authorization Type [employer] employee plan / Medicare A&B 2025, Berkley Harvey not required    Progress Note Due on Visit 10    OT Start Time 1315    OT Stop Time 1400    OT Time Calculation (min) 45 min    Activity Tolerance Patient tolerated treatment well    Behavior During Therapy WFL for tasks assessed/performed             Past Medical History:  Diagnosis Date   Arthritis    joints/  back/  neck   Chronic headaches    due to cervical spondylosis   Chronic renal insufficiency, stage 2 (mild)    borderlines II/III   Coronary artery disease 1999   cardiologist--- dr Antoine Poche---  (per cardiology note pt refused to take asa/ statin) hx PCI w/ stenting in 1999 to LAD and 12-23-2007 x2 DES to proximal LAD;  Cath 03/2017-->  non- critical 70 - 80% mid/distal circ stenosis,  chronic small D1 80% stenosis (stable) and DES to proximal and mid LAD--   Med mgmt rec'd-->nitrates caused HAs, ACE-I caused cough.   Corticobasal syndrome (HCC)    [facility] 04/2023   DDD (degenerative disc disease)    lumbar and cervical neck   Diverticulosis of sigmoid colon 07/25/2011   Severe (endoscopy by Dr. Jarold Motto)   Erectile dysfunction after radical prostatectomy    History of kidney stones    History of prostate cancer 12/2014   urologist-- dr Retta Diones---   s/p Robot assisted radical prostatectomy 07/2015: lymph node involvement//   undetactable PSA since   HTN (hypertension)    ACE-I cough.  Fine on ARB.   Hx of blastomycosis 1978   s/p  right upper  lobectomy   Hyperlipidemia    NASH (nonalcoholic steatohepatitis) 06/2011   normal LFTS  /  Hep B and C testing neg.  Mild fatty liver on CT 2020   Nocturia    Parkinsonism (HCC)    2023/24, unknown etiology (Dat scan neg), peripheral nerve bx, and spinal fluid testing all NEG/Norma. [facility] 2nd opinion-->corticobasal syndrome 04/2023)   Pseudobulbar affect    Pulmonary nodules    small, picked up on CT ab/pelv for abd pains/flank pains-->pt quit smoking 40 yrs ago.  Low risk for lung ca so no f/u CT needed.   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   Tinnitus    Wears glasses    Wears hearing aid in both ears    Past Surgical History:  Procedure Laterality Date   ANTERIOR CERVICAL DECOMP/DISCECTOMY FUSION  1982   C6--7   APPENDECTOMY  1953   CARDIAC CATHETERIZATION  02/27/2000   @[facility]  by dr hochrein ---  single vessel w/ preserved LVSF   CORONARY ANGIOPLASTY WITH STENT PLACEMENT  12/23/2007   by  Dr End--- PCI w/ DES x2 to  pLAD   CORONARY STENT PLACEMENT  1999   PTCA w/ stenting to LAd   CYSTOSCOPY WITH RETROGRADE PYELOGRAM, URETEROSCOPY AND STENT PLACEMENT Left 05/10/2018   Procedure: CYSTOSCOPY WITH LEFT RETROGRADE PYELOGRAM, LEFT URETEROSCOPY AND LEFT URETERAL STENT PLACEMENT;  Surgeon: Sebastian Ache, MD;  Location: [facility];  Service: Urology;  Laterality: Left;   HOLMIUM LASER APPLICATION Left 05/10/2018   Procedure: HOLMIUM LASER APPLICATION;  Surgeon: Sebastian Ache, MD;  Location: [facility];  Service: Urology;  Laterality: Left;   INCISIONAL HERNIA REPAIR N/A 03/19/2019   Procedure: LAPAROSCOPIC INCISIONAL HERNIA REPAIR WITH MESH, RECURRENT UMBILICAL HERNIA REPAIR;  Surgeon: Karie Soda, MD;  Location: [facility];  Service: General;  Laterality: N/A;   INGUINAL HERNIA REPAIR Right 03/19/2019   Procedure:  LAPAROSCOPIC BILATERAL FEMORAL AND INGUINAL HERNIA REPAIR WITH MESH, LYSIS OF ADHESIONS;  Surgeon: Karie Soda, MD;  Location: [facility];  Service: General;  Laterality: Right;   LEFT HEART CATH AND CORONARY ANGIOGRAPHY N/A 03/21/2017   Stents patent; mod circ dz, nothing for intervention--imdur added.  Procedure: LEFT HEART CATH AND CORONARY ANGIOGRAPHY;  Surgeon: Yvonne Kendall, MD;  Location: [facility];  Service: Cardiovascular;  Laterality: N/A;   LUMBAR SPINE SURGERY  1978   L5--S1   LUNG LOBECTOMY Right 1978   right upper lobectomy for blastomycosis   LYMPHADENECTOMY Bilateral 07/14/2015   Procedure: LYMPHADENECTOMY;  Surgeon: Heloise Purpura, MD;  Location: [facility];  Service: Urology;  Laterality: Bilateral;   ROBOT ASSISTED LAPAROSCOPIC RADICAL PROSTATECTOMY N/A 07/14/2015   One positive pelvic LN.  Procedure: XI ROBOTIC ASSISTED LAPAROSCOPIC RADICAL PROSTATECTOMY LEVEL 2;  Surgeon: Heloise Purpura, MD;  Location: [facility];  Service: Urology;  Laterality: N/A;   ROTATOR CUFF REPAIR  2007   UMBILICAL HERNIA REPAIR  07/14/2015   Procedure: HERNIA REPAIR UMBILICAL ADULT;  Surgeon: Heloise Purpura, MD;  Location: [facility];  Service: Urology;;   Patient Active Problem List   Diagnosis Date Noted   Generalized weakness 09/03/2022   Word finding difficulty 09/03/2022   Worsened handwriting 09/03/2022   Proximal muscle weakness 09/03/2022   Incisional hernia s/p lap repair with mesh 03/19/2019 03/19/2019   Recurrent umbilical hernia s/p lap repair with mesh 03/19/2019 03/19/2019   Bilateral inguinal hernia s/p lap repair with mesh 03/19/2019 03/19/2019   Bilateral femoral hernia s/p lap repair with mesh 03/19/2019 03/19/2019   Overweight (BMI 25.0-29.9) 11/28/2018   Accelerating angina (HCC) 03/21/2017   Elevated blood pressure reading without diagnosis of hypertension 09/23/2015   Prostate cancer (HCC) 07/14/2015   Preventative health care 03/19/2014   Insomnia 03/19/2014   Benign  prostatic hyperplasia 09/15/2012   Chronic low back pain 10/15/2011   Gallbladder polyp 10/15/2011   Need for shingles vaccine 10/15/2011   Special screening for malignant neoplasms, colon 07/25/2011   History of colonic polyps 07/25/2011   Benign neoplasm of colon 07/25/2011   Diverticulosis of colon 07/25/2011   Colon cancer screening 07/12/2011   Hx of colonic polyp 07/12/2011   CAD (coronary artery disease) 07/12/2011   Low back pain 02/13/2011   Prostate cancer screening 02/13/2011   HTN (hypertension) 01/03/2011   Health maintenance examination 10/13/2010   Transaminasemia 10/13/2010   Hyperlipidemia 11/06/2008   Coronary atherosclerosis 11/06/2008   DEGENERATIVE DISC DISEASE 11/06/2008    ONSET DATE: 05/08/2023 (referral date), 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 Pt reported increased time for dressing. Pt reported difficulty with talking.   Pt's spouse reported pt did not want to come to therapy. Per pt, "there's no hope" d/t current dx. OT provided verbal encouragement and reassurance.  Pt accompanied by: self and significant other Steward Drone, wife)  PERTINENT HISTORY: Per 05/22/23 SLP Eval: History of hypertension, hyperlipidemia, and coronary artery disease. 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 Was recently evaluated at [facility] and concluded corticobasal degeneration. His Sinemet dose was increased and he is now currently taking 2 tablets 3 times a day but he cannot tell any  difference.    PRECAUTIONS: Fall   WEIGHT BEARING RESTRICTIONS: No   PAIN:  Are you having pain? No  FALLS: Has patient fallen in last 6 months? Yes. Number of falls multiple falls over the past week  Pt reported fall yesterday and today when ambulating and lost balance. Pt's spouse reported pt fell backwards when walking with walker.  LIVING ENVIRONMENT:  Lives with: lives with their spouse Lives in: House/apartment Stairs: Yes: Internal: 2 level steps; bilateral but cannot reach both and External: 6 steps; bilateral but cannot reach both Has following equipment at home: Dan Humphreys - 2 wheeled, Wheelchair (manual), shower chair, and Grab bars, recliner lift chair, Chair lift to 2nd floor, main bedroom on first floor, office space on second floor  PLOF: Independent with basic ADLs and able to dress self in the AM needs assist in PM due to fatigue.  Notes difficulty with transferring into shower, Sleep Number bed (bed will be lowered this week)  PATIENT GOALS: Pt's spouse reported hope to improve transfer from w/c to lift chair and to improve bed mobility at night when more fatigued  OBJECTIVE:  Note: Objective measures were completed at Evaluation unless otherwise noted.  HAND DOMINANCE: Right  ADLs: Overall ADLs:  Transfers/ambulation related to ADLs: Eating: ind Grooming: ind UB Dressing: ind though with extra time, sometimes with assistance d/t significant impact from fatigue and sometimes feeling unsteady LB Dressing: ind though with extra time, sometimes with assistance d/t significant impact from fatigue and sometimes feeling unsteady Toileting: uses urinal most of the time, with assistance Bathing: ind with shower seat, grab bars, long-handled sponge, and walk-in shower with small step. Pt also ind cleans glass door of shower. Some difficulty with drying (standing up) d/t unsteadiness because needing to hold grab bars with 1 hand. Tub Shower transfers: some unsteadiness though  ind Equipment: Shower seat without back, Grab bars, Walk in shower, Reacher, and Long handled sponge  IADLs: Shopping: dependent Light housekeeping: dependent  Meal Prep: setup assistance for breakfast and lunch, grilled cheese using Panini maker, simple microwave meals (soup, "Salmon burgers"), yogurt and blueberries, nuts/cheese/cracker/peanut butter - Desk area in kitchen available where pt can sit in w/c and prepare meals. Pt's spouse reported some difficulty with microwave meals secondary to unsteadiness on feet when accessing built-in microwave. Community mobility: not currently driving Medication management: ind, no difficulty with remembering to take medications   Pt reported increased difficulty with texting and typing tasks.  Handwriting:  18 seconds to write "[place]ales live [place] ocean." Approx. 50-60% legibility. Micrographia.  MOBILITY STATUS:  Pt arrived to OT eval in w/c and pt reported using w/c "90% of the time." Pt uses 2-wheeled walker when pt first gets up in the morning .  POSTURE COMMENTS:  rounded shoulders and Pt able to maintain seated posture in w/c without back support. Pt reported "even in a chair without arms, I fall over."  Pt's spouse reported pt has hx of falling backwards.   ACTIVITY TOLERANCE: Activity tolerance: increased fatigue, especially in P.M.  FUNCTIONAL OUTCOME MEASURES: Fastening/unfastening 3 buttons: 55 seconds.  Pt also able to ind button/unbutton with shirt donned. Physical performance test: PPT#2 (simulated eating) 26 seconds, small and slower movements, all beans placed on tip of spoon, x1 drop  & PPT#4 (donning/doffing jacket): >60 seconds (Pt stopped and stated "I failed that." Pt estimated min assist for dressing tasks.  COORDINATION: 9 Hole Peg test: Right: 45 sec; Left: 49 sec Tremors: none per pt report  UE ROM:  WFL - Pt reported mild soreness at B shoulders d/t recent falls, constant use of grab bars and "pushing self up and  around."  UE MMT:    RUE: 4, LUE: 3+  SENSATION: No changes per pt  MUSCLE TONE: RUE: Within functional limits and LUE: Within functional limits  COGNITION: Overall cognitive status: Within functional limits for tasks assessed, increased processing time for verbal instructions though  OBSERVATIONS: Bradykinesia and Hypokinesia. Pt seated in manual w/c. Pt's spouse assisted with w/c mobility. Pt appeared well-kept. Pt was pleasant and benefited from extra time for processing/responding to questions.                                                                                                                    TREATMENT DATE:  Eval only  OT educated pt and pt's spouse on adaptive strategies for ADL/IADL tasks: Dry with towel while seated to decrease unsteadiness, place microwave on lower surface to allow pt to access microwave while seated, and complete dressing tasks while seated to decrease fall risk. Pt and pt's spouse verbalized understanding.   PATIENT EDUCATION: Education details: OT role, POC, see above Person educated: Patient and Spouse Education method: Explanation Education comprehension: verbalized understanding  HOME EXERCISE PROGRAM: TBD  GOALS: Goals reviewed with patient? Yes  GOALS:  SHORT TERM GOALS: Target date: 06/28/23    With no more than min v/c and visual handouts, pt will complete PD specific HEP in order to maintain BUE strength/AROM.  Baseline: not yet initiated Goal status: INITIAL  2.  Pt will verbalize understanding of adapted strategies to maximize safety and independence with UB dressing and LB dressing tasks, including completion of tasks while seated.  Baseline:  ind though with extra time, sometimes with assistance d/t significant impact from fatigue and sometimes feeling unsteady when completing dressing while standing Goal status: INITIAL  3.  Pt will write a sentence with no significant decrease in size and maintain at least 50%  legibility.  Baseline: 50-60% legibility, increased time, micrographia Goal status: INITIAL  4.  Pt will maintain fine motor coordination for texting and typing tasks as evidenced by completing 9-hole peg task in 50 seconds or less with BUE.  Baseline: 9 Hole Peg test: Right: 45 sec; Left: 49 sec Goal status: INITIAL   5.  Pt will verbalize understanding of adaptive strategies and A/E options to increase ind for typing on computer and/or texting on phone.  Baseline: Pt reported increased difficulty with texting  and typing tasks.  Goal status: INITIAL    LONG TERM GOALS: Target date: 07/26/23    Pt will verbalize understanding of ways to prevent future PD related complications and PD community resources. Baseline: not yet initiated Goal status: INITIAL   2.  Pt will demonstrate improved ease with feeding as evidenced by maintaining time for PPT#2 and fastening buttons.  Baseline: PPT#2 (Simulated eating): 26 seconds, 3-buttons: 55 seconds Goal status: INITIAL   3.  Pt and pt's spouse will verbalize understanding of meal prep adaptive strategies to maintain ind when preparing simple meals for breakfast and lunch.  Baseline: setup assistance for breakfast and lunch, grilled cheese using Panini maker, simple microwave meals (soup, "Salmon burgers"), yogurt and blueberries, nuts/cheese/cracker/peanut butter - Desk area in kitchen available where pt can sit in w/c and prepare meals. Pt's spouse reported some difficulty with microwave meals secondary to unsteadiness on feet when accessing built-in microwave. Goal status: INITIAL  4. Pt will demo improved attention to safety (seated) and demo understanding of adaptive strategies for ADL bathing task component of drying off with a towel. Baseline: Some difficulty with drying off with towel (standing up) d/t unsteadiness because pt needs to hold grab bars with 1 hand.  Goal status: INITIAL       ASSESSMENT:  CLINICAL  IMPRESSION: Patient is a 83 y.o. male who was seen today for occupational therapy evaluation for generalized weakness, dysarthria, unsteady gait, neurodegenerative disorder. 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 Patient currently presents at lower level of baseline functioning demonstrating functional deficits and impairments as noted below. The patient requires the skill of OT to help with ongoing exercise and mobility to prevent rapid and significant decline in functional mobility, strength, and ADL/IADL participation.  Therefore, continued skilled maintenance therapy is recommended for this patient.  PERFORMANCE DEFICITS: in functional skills including ADLs, IADLs, coordination, dexterity, proprioception, ROM, strength, flexibility, Fine motor control, Gross motor control, hearing, mobility, balance, body mechanics, endurance, decreased knowledge of use of DME, and UE functional use, cognitive skills including energy/drive, and psychosocial skills including environmental adaptation.   IMPAIRMENTS: are limiting patient from ADLs, IADLs, rest and sleep, leisure, and social participation.   COMORBIDITIES:  may have co-morbidities  that affects occupational performance. Patient will benefit from skilled OT to address above impairments and improve overall function.  MODIFICATION OR ASSISTANCE TO COMPLETE EVALUATION: Min-Moderate modification of tasks or assist with assess necessary to complete an evaluation.  OT OCCUPATIONAL PROFILE AND HISTORY: Problem focused assessment: Including review of records relating to presenting problem.  CLINICAL DECISION MAKING: LOW - limited treatment options, no task modification necessary  REHAB POTENTIAL: Fair d/t chronicity of dx  EVALUATION COMPLEXITY: Low    PLAN:  OT FREQUENCY: 1-2x/week, pt's spouse requested 1x per week d/t scheduling conflicts.  OT DURATION: 8 weeks  PLANNED  INTERVENTIONS: 97168 OT Re-evaluation, 97535 self care/ADL training, 78295 therapeutic exercise, 97530 therapeutic activity, 97112 neuromuscular re-education, 97140 manual therapy, 97760 Orthotics management and training, 62130 Splinting (initial encounter), [id number] Subsequent splinting/medication,  passive range of motion, functional mobility training, energy conservation, patient/family education, and DME and/or AE instructions  RECOMMENDED OTHER SERVICES: RECOMMENDED OTHER SERVICES: SLP and PT eval already completed  CONSULTED AND AGREED WITH PLAN OF CARE: Patient and family member/caregiver  PLAN FOR NEXT SESSION:   Shoulder AROM and Theraband HEP (yellow or red) for BUE  FM coordination HEP  A/E and adaptive strategies for typing, texting, UB dressing, LB dressing, meal prep, drying off after bathing   Wynetta Emery, OT 05/29/2023, 2:41 PM

## 2023-05-29 NOTE — Therapy (Signed)
OUTPATIENT PHYSICAL THERAPY NEURO TREATMENT   Patient Name: William Montgomery MRN: [id number] DOB:07/24/1940, 83 y.o., male Today's Date: 05/29/2023   PCP: Jeoffrey Massed, MD REFERRING PROVIDER: Jeoffrey Massed, MD  END OF SESSION:  PT End of Session - 05/29/23 1406     Visit Number 2    Number of Visits 9    Date for PT Re-Evaluation 07/24/23    Authorization Type Medicare A/B    Progress Note Due on Visit 10    PT Start Time 1405    PT Stop Time 1445    PT Time Calculation (min) 40 min             Past Medical History:  Diagnosis Date   Arthritis    joints/  back/  neck   Chronic headaches    due to cervical spondylosis   Chronic renal insufficiency, stage 2 (mild)    borderlines II/III   Coronary artery disease 1999   cardiologist--- dr Antoine Poche---  (per cardiology note pt refused to take asa/ statin) hx PCI w/ stenting in 1999 to LAD and 12-23-2007 x2 DES to proximal LAD;  Cath 03/2017-->  non- critical 70 - 80% mid/distal circ stenosis,  chronic small D1 80% stenosis (stable) and DES to proximal and mid LAD--   Med mgmt rec'd-->nitrates caused HAs, ACE-I caused cough.   Corticobasal syndrome (HCC)    [facility] 04/2023   DDD (degenerative disc disease)    lumbar and cervical neck   Diverticulosis of sigmoid colon 07/25/2011   Severe (endoscopy by Dr. Jarold Motto)   Erectile dysfunction after radical prostatectomy    History of kidney stones    History of prostate cancer 12/2014   urologist-- dr Retta Diones---   s/p Robot assisted radical prostatectomy 07/2015: lymph node involvement//   undetactable PSA since   HTN (hypertension)    ACE-I cough.  Fine on ARB.   Hx of blastomycosis 1978   s/p  right upper lobectomy   Hyperlipidemia    NASH (nonalcoholic steatohepatitis) 06/2011   normal LFTS  /  Hep B and C testing neg.  Mild fatty liver on CT 2020   Nocturia    Parkinsonism (HCC)    2023/24, unknown etiology (Dat scan neg), peripheral nerve bx, and  spinal fluid testing all NEG/Norma. [facility] 2nd opinion-->corticobasal syndrome 04/2023)   Pseudobulbar affect    Pulmonary nodules    small, picked up on CT ab/pelv for abd pains/flank pains-->pt quit smoking 40 yrs ago.  Low risk for lung ca so no f/u CT needed.   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   Tinnitus    Wears glasses    Wears hearing aid in both ears    Past Surgical History:  Procedure Laterality Date   ANTERIOR CERVICAL DECOMP/DISCECTOMY FUSION  1982   C6--7   APPENDECTOMY  1953   CARDIAC CATHETERIZATION  02/27/2000   @[facility]  by dr hochrein ---  single vessel w/ preserved LVSF   CORONARY ANGIOPLASTY WITH STENT PLACEMENT  12/23/2007   by Dr End--- PCI w/ DES x2 to  pLAD   CORONARY STENT PLACEMENT  1999   PTCA w/ stenting to LAd   CYSTOSCOPY WITH RETROGRADE PYELOGRAM, URETEROSCOPY AND STENT PLACEMENT Left 05/10/2018   Procedure: CYSTOSCOPY WITH LEFT  RETROGRADE PYELOGRAM, LEFT URETEROSCOPY AND LEFT URETERAL STENT PLACEMENT;  Surgeon: Sebastian Ache, MD;  Location: [facility];  Service: Urology;  Laterality: Left;   HOLMIUM LASER APPLICATION Left 05/10/2018   Procedure: HOLMIUM LASER APPLICATION;  Surgeon: Sebastian Ache, MD;  Location: [facility];  Service: Urology;  Laterality: Left;   INCISIONAL HERNIA REPAIR N/A 03/19/2019   Procedure: LAPAROSCOPIC INCISIONAL HERNIA REPAIR WITH MESH, RECURRENT UMBILICAL HERNIA REPAIR;  Surgeon: Karie Soda, MD;  Location: [facility];  Service: General;  Laterality: N/A;   INGUINAL HERNIA REPAIR Right 03/19/2019   Procedure: LAPAROSCOPIC BILATERAL FEMORAL AND INGUINAL HERNIA REPAIR WITH MESH, LYSIS OF ADHESIONS;  Surgeon: Karie Soda, MD;  Location: [facility];  Service: General;  Laterality: Right;   LEFT HEART CATH AND CORONARY ANGIOGRAPHY N/A 03/21/2017   Stents patent; mod circ  dz, nothing for intervention--imdur added.  Procedure: LEFT HEART CATH AND CORONARY ANGIOGRAPHY;  Surgeon: Yvonne Kendall, MD;  Location: [facility];  Service: Cardiovascular;  Laterality: N/A;   LUMBAR SPINE SURGERY  1978   L5--S1   LUNG LOBECTOMY Right 1978   right upper lobectomy for blastomycosis   LYMPHADENECTOMY Bilateral 07/14/2015   Procedure: LYMPHADENECTOMY;  Surgeon: Heloise Purpura, MD;  Location: [facility];  Service: Urology;  Laterality: Bilateral;   ROBOT ASSISTED LAPAROSCOPIC RADICAL PROSTATECTOMY N/A 07/14/2015   One positive pelvic LN.  Procedure: XI ROBOTIC ASSISTED LAPAROSCOPIC RADICAL PROSTATECTOMY LEVEL 2;  Surgeon: Heloise Purpura, MD;  Location: [facility];  Service: Urology;  Laterality: N/A;   ROTATOR CUFF REPAIR  2007   UMBILICAL HERNIA REPAIR  07/14/2015   Procedure: HERNIA REPAIR UMBILICAL ADULT;  Surgeon: Heloise Purpura, MD;  Location: [facility];  Service: Urology;;   Patient Active Problem List   Diagnosis Date Noted   Generalized weakness 09/03/2022   Word finding difficulty 09/03/2022   Worsened handwriting 09/03/2022   Proximal muscle weakness 09/03/2022   Incisional hernia s/p lap repair with mesh 03/19/2019 03/19/2019   Recurrent umbilical hernia s/p lap repair with mesh 03/19/2019 03/19/2019   Bilateral inguinal hernia s/p lap repair with mesh 03/19/2019 03/19/2019   Bilateral femoral hernia s/p lap repair with mesh 03/19/2019 03/19/2019   Overweight (BMI 25.0-29.9) 11/28/2018   Accelerating angina (HCC) 03/21/2017   Elevated blood pressure reading without diagnosis of hypertension 09/23/2015   Prostate cancer (HCC) 07/14/2015   Preventative health care 03/19/2014   Insomnia 03/19/2014   Benign prostatic hyperplasia 09/15/2012   Chronic low back pain 10/15/2011   Gallbladder polyp 10/15/2011   Need for shingles vaccine 10/15/2011   Special screening for malignant neoplasms, colon 07/25/2011   History of colonic polyps 07/25/2011   Benign neoplasm of  colon 07/25/2011   Diverticulosis of colon 07/25/2011   Colon cancer screening 07/12/2011   Hx of colonic polyp 07/12/2011   CAD (coronary artery disease) 07/12/2011   Low back pain 02/13/2011   Prostate cancer screening 02/13/2011   HTN (hypertension) 01/03/2011   Health maintenance examination 10/13/2010   Transaminasemia 10/13/2010   Hyperlipidemia 11/06/2008   Coronary atherosclerosis 11/06/2008   DEGENERATIVE DISC DISEASE 11/06/2008    ONSET DATE: x 1 year  REFERRING DIAG: R53.1 (ICD-10-CM) - Generalized weakness R47.1 (ICD-10-CM) - Dysarthria R26.81 (ICD-10-CM) - Unsteady gait 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 Extensive workup with Dr. Loleta Chance. He is status post second opinion with [facility] Current diagnosis is corticobasal syndrome. His Sinemet dose was increased and he is now currently taking 2 tablets 3 times a day but he cannot tell any difference yet underwent second opinion neurologic evaluation at the [facility] division of movement disorders on 04/17/2023. 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  History of hypertension, hyperlipidemia, and coronary artery disease  PAIN:  Are you having pain? No, Right shoulder can cause pain with transfers  PRECAUTIONS: Fall  RED FLAGS: None   WEIGHT BEARING RESTRICTIONS: No  FALLS: Has patient fallen in last 6 months? Yes. Number  of falls multiple falls week  LIVING ENVIRONMENT: Lives with: lives with their spouse Lives in: House/apartment Stairs: Yes: Internal: 2 level steps; bilateral but cannot reach both and External: 6 steps; bilateral but cannot reach both Has following equipment at home: Dan Humphreys - 2 wheeled, Wheelchair (manual), shower chair, and Grab bars, recliner lift chair, Chair lift to 2nd floor  PLOF: Independent with basic ADLs and able to dress self in the AM needs assist in PM due to fatigue.  Notes difficulty with transferring into shower, Sleep Number bed  PATIENT GOALS: "move around better"  OBJECTIVE:   TODAY'S TREATMENT: 05/29/23 Activity Comments  Transfer training Slideboard- training in sequence for sit-sit w/ tactile and verbal cues for sequence and trunk flexion over BOS Walker-trials with stand-pivot using Ustep  Bed mobility Slick sheet trainiing  Gait training U-step tensioned wheels             Note: Objective measures were completed at Evaluation unless otherwise noted.  DIAGNOSTIC FINDINGS: IMPRESSION: 1. No acute intracranial abnormality. Small left temporal scalp contusion. 2. No acute cervical spine fracture or traumatic malalignment.  COGNITION: Overall cognitive status:  notes his memory seems intact   SENSATION: Not tested  COORDINATION: Deficits with rapid alternating movements Heel to shin impaired RLE > LLE Finger to nose moderately impaired  EDEMA:  none  MUSCLE TONE: NT    POSTURE: forward head  LOWER EXTREMITY ROM:     Active  Right Eval Left Eval  Hip flexion 110 110  Hip extension    Hip abduction    Hip adduction    Hip internal rotation    Hip external rotation    Knee flexion 120 120  Knee extension 0 0  Ankle dorsiflexion 15 15  Ankle plantarflexion 30 30  Ankle inversion    Ankle eversion     (Blank rows = not tested)  LOWER EXTREMITY MMT:  (resisted tests in sitting)  MMT Right Eval Left Eval  Hip flexion 4 5  Hip  extension    Hip abduction 4 5  Hip adduction 5 5  Hip internal rotation    Hip external rotation    Knee flexion 4 5  Knee extension 5 5  Ankle dorsiflexion 5 5  Ankle plantarflexion    Ankle inversion    Ankle eversion    (Blank rows = not tested)  BED MOBILITY:  Sit to supine Min A Supine to sit Min A  TRANSFERS: Assistive device utilized: Environmental consultant - 2 wheeled  Sit to stand: CGA and Min A Stand to sit: CGA Chair to chair: CGA Floor:  NT    CURB:  Level of Assistance: Min A Assistive device utilized: Environmental consultant - 2 wheeled Curb Comments:  STAIRS: NT--reports use of BHR and 6 stairs for entering/exiting home w/ spouse SBA-CGA  GAIT: Gait pattern: decreased step length- Right, decreased step length- Left, festinating, wide BOS, and abducted- Right Distance walked: 75 Assistive device utilized: Walker - 2 wheeled Level of assistance: CGA and Min A Comments:   FUNCTIONAL TESTS:  Timed up and go (TUG): TBD 10 meter walk test: 1 min 16 sec (76 sec) = 0.43 ft/sec Berg Balance Scale: 23/56  Static standing: Fair- 180 degree turns: 30 sec 360 degree turns: unable                                                                                                                   PATIENT EDUCATION: Education details: assessment details, rationale of PT intervention (restoration vs adaptation/compensation) Person educated: Patient and Spouse Education method: Explanation Education comprehension: verbalized understanding  HOME EXERCISE PROGRAM: TBD  GOALS: Goals reviewed with patient? Yes  SHORT TERM GOALS: Target date: 06/19/2023    Patient will be independent in HEP to improve functional outcomes Baseline: Goal status: INITIAL  2.  Demo/teach-back principles to reduce freezing of gait to improve safety with turning during gait Baseline: 30 sec to negotiate 180 degree turn Goal status: INITIAL  3.  Demo supervision for bed mobility to reduce level of assistance  from caregiver Baseline: min-mod A Goal status: INITIAL  4.  Demo squat-pivot and sit-sit transfers from w/c to seated surfaces w/ supervision to improve safety with mobility Baseline: CGA-min A stand-pivot Goal status: INITIAL    LONG TERM GOALS: Target date: 07/24/2023    Demo improved balance and reduced risk for falls as evidenced by score 30/56 Berg Balance Test Baseline: 23/56 Goal status: INITIAL  2.  Demo ability to ambulate x 150 ft with supervision using least restrictive AD to improve safety with household ambulation Baseline: CGA-min A x 75 ft w/ RW Goal status: INITIAL  3.  Caregiver to demonstrate alternative transfer techniques to improve safety with patient mobility Baseline: e.g. slideboard, transfer disc, slick sheet, etc Goal status: INITIAL    ASSESSMENT:  CLINICAL IMPRESSION: Reports ongoing movement deficits and multiple falls week. Session initiated with training in use of slide board for sit-sit transfers requiring min-mod A for sequence and assist with cues for trunk flexion over BOS and coordinating UE push-off.  Trials with walker for stand-pivot min-mod A to overcome retropulsion.  Demo of slick sheet for bed mobility and use of leg lifter for pt and caregiver to perform sit to supine min A and mod A for supine to sit.  U-step was not very helpful in improving gait mechanics and safety. Will trial platform RW at next session. Continued sessions to address deifcits and limitations  OBJECTIVE IMPAIRMENTS: Abnormal gait, decreased activity tolerance, decreased balance, decreased coordination, decreased endurance, decreased knowledge of use of DME, decreased mobility, difficulty walking, decreased strength, impaired UE functional use, and postural dysfunction.   ACTIVITY LIMITATIONS: carrying, lifting, bending, sitting, standing, stairs, transfers, bed mobility, reach over head, and locomotion level  PARTICIPATION LIMITATIONS: cleaning, interpersonal  relationship, shopping, community activity, and household activities  PERSONAL FACTORS: Age and Time since onset of injury/illness/exacerbation are also affecting patient's functional outcome.   REHAB POTENTIAL: Good  CLINICAL DECISION MAKING: Evolving/moderate complexity  EVALUATION COMPLEXITY: Moderate  PLAN:  PT FREQUENCY: 1x/week  PT DURATION: 8 weeks  PLANNED INTERVENTIONS: 97110-Therapeutic exercises, 97530- Therapeutic activity, 97112- Neuromuscular re-education, [phone]- Self Care, 62130- Manual therapy, [phone]- Gait training, [phone]- Orthotic Fit/training, [phone]- Canalith repositioning, [phone]- Aquatic Therapy, Patient/Family education, Balance training, Stair training, Joint mobilization, Spinal mobilization, Vestibular training, DME instructions, Wheelchair mobility training, Cryotherapy, and Moist heat  PLAN FOR NEXT SESSION: gait trials with platform RW, freezing of gait techniques, slideboard transfer with spouse, squat-stand-pivot transfers w/ slide on foot? Initiate activities for HEP   2:06 PM, 05/29/23 M. Shary Decamp, PT, DPT Physical Therapist- [place] Office Number: [phone]

## 2023-06-05 ENCOUNTER — Ambulatory Visit: Payer: Commercial Managed Care - PPO

## 2023-06-05 ENCOUNTER — Ambulatory Visit: Payer: Commercial Managed Care - PPO | Admitting: Occupational Therapy

## 2023-06-05 DIAGNOSIS — M6281 Muscle weakness (generalized): Secondary | ICD-10-CM

## 2023-06-05 DIAGNOSIS — R2689 Other abnormalities of gait and mobility: Secondary | ICD-10-CM

## 2023-06-05 DIAGNOSIS — R471 Dysarthria and anarthria: Secondary | ICD-10-CM | POA: Diagnosis not present

## 2023-06-05 DIAGNOSIS — G319 Degenerative disease of nervous system, unspecified: Secondary | ICD-10-CM | POA: Diagnosis not present

## 2023-06-05 DIAGNOSIS — R262 Difficulty in walking, not elsewhere classified: Secondary | ICD-10-CM

## 2023-06-05 DIAGNOSIS — R278 Other lack of coordination: Secondary | ICD-10-CM | POA: Diagnosis not present

## 2023-06-05 DIAGNOSIS — R2681 Unsteadiness on feet: Secondary | ICD-10-CM | POA: Diagnosis not present

## 2023-06-05 DIAGNOSIS — R531 Weakness: Secondary | ICD-10-CM | POA: Diagnosis not present

## 2023-06-05 NOTE — Therapy (Signed)
OUTPATIENT PHYSICAL THERAPY NEURO TREATMENT   Patient Name: William Montgomery MRN: [id number] DOB:09-20-1940, 83 y.o., male Today's Date: 06/05/2023   PCP: Jeoffrey Massed, MD REFERRING PROVIDER: Jeoffrey Massed, MD  END OF SESSION:  PT End of Session - 06/05/23 1437     Visit Number 3    Number of Visits 9    Date for PT Re-Evaluation 07/24/23    Authorization Type Medicare A/B    Progress Note Due on Visit 10    PT Start Time 1445    PT Stop Time 1530    PT Time Calculation (min) 45 min             Past Medical History:  Diagnosis Date   Arthritis    joints/  back/  neck   Chronic headaches    due to cervical spondylosis   Chronic renal insufficiency, stage 2 (mild)    borderlines II/III   Coronary artery disease 1999   cardiologist--- dr Antoine Poche---  (per cardiology note pt refused to take asa/ statin) hx PCI w/ stenting in 1999 to LAD and 12-23-2007 x2 DES to proximal LAD;  Cath 03/2017-->  non- critical 70 - 80% mid/distal circ stenosis,  chronic small D1 80% stenosis (stable) and DES to proximal and mid LAD--   Med mgmt rec'd-->nitrates caused HAs, ACE-I caused cough.   Corticobasal syndrome (HCC)    [facility] 04/2023   DDD (degenerative disc disease)    lumbar and cervical neck   Diverticulosis of sigmoid colon 07/25/2011   Severe (endoscopy by Dr. Jarold Motto)   Erectile dysfunction after radical prostatectomy    History of kidney stones    History of prostate cancer 12/2014   urologist-- dr Retta Diones---   s/p Robot assisted radical prostatectomy 07/2015: lymph node involvement//   undetactable PSA since   HTN (hypertension)    ACE-I cough.  Fine on ARB.   Hx of blastomycosis 1978   s/p  right upper lobectomy   Hyperlipidemia    NASH (nonalcoholic steatohepatitis) 06/2011   normal LFTS  /  Hep B and C testing neg.  Mild fatty liver on CT 2020   Nocturia    Parkinsonism (HCC)    2023/24, unknown etiology (Dat scan neg), peripheral nerve bx, and  spinal fluid testing all NEG/Norma. [facility] 2nd opinion-->corticobasal syndrome 04/2023)   Pseudobulbar affect    Pulmonary nodules    small, picked up on CT ab/pelv for abd pains/flank pains-->pt quit smoking 40 yrs ago.  Low risk for lung ca so no f/u CT needed.   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   Tinnitus    Wears glasses    Wears hearing aid in both ears    Past Surgical History:  Procedure Laterality Date   ANTERIOR CERVICAL DECOMP/DISCECTOMY FUSION  1982   C6--7   APPENDECTOMY  1953   CARDIAC CATHETERIZATION  02/27/2000   @[facility]  by dr hochrein ---  single vessel w/ preserved LVSF   CORONARY ANGIOPLASTY WITH STENT PLACEMENT  12/23/2007   by Dr End--- PCI w/ DES x2 to  pLAD   CORONARY STENT PLACEMENT  1999   PTCA w/ stenting to LAd   CYSTOSCOPY WITH RETROGRADE PYELOGRAM, URETEROSCOPY AND STENT PLACEMENT Left 05/10/2018   Procedure: CYSTOSCOPY WITH LEFT  RETROGRADE PYELOGRAM, LEFT URETEROSCOPY AND LEFT URETERAL STENT PLACEMENT;  Surgeon: Sebastian Ache, MD;  Location: [facility];  Service: Urology;  Laterality: Left;   HOLMIUM LASER APPLICATION Left 05/10/2018   Procedure: HOLMIUM LASER APPLICATION;  Surgeon: Sebastian Ache, MD;  Location: [facility];  Service: Urology;  Laterality: Left;   INCISIONAL HERNIA REPAIR N/A 03/19/2019   Procedure: LAPAROSCOPIC INCISIONAL HERNIA REPAIR WITH MESH, RECURRENT UMBILICAL HERNIA REPAIR;  Surgeon: Karie Soda, MD;  Location: [facility];  Service: General;  Laterality: N/A;   INGUINAL HERNIA REPAIR Right 03/19/2019   Procedure: LAPAROSCOPIC BILATERAL FEMORAL AND INGUINAL HERNIA REPAIR WITH MESH, LYSIS OF ADHESIONS;  Surgeon: Karie Soda, MD;  Location: [facility];  Service: General;  Laterality: Right;   LEFT HEART CATH AND CORONARY ANGIOGRAPHY N/A 03/21/2017   Stents patent; mod circ  dz, nothing for intervention--imdur added.  Procedure: LEFT HEART CATH AND CORONARY ANGIOGRAPHY;  Surgeon: Yvonne Kendall, MD;  Location: [facility];  Service: Cardiovascular;  Laterality: N/A;   LUMBAR SPINE SURGERY  1978   L5--S1   LUNG LOBECTOMY Right 1978   right upper lobectomy for blastomycosis   LYMPHADENECTOMY Bilateral 07/14/2015   Procedure: LYMPHADENECTOMY;  Surgeon: Heloise Purpura, MD;  Location: [facility];  Service: Urology;  Laterality: Bilateral;   ROBOT ASSISTED LAPAROSCOPIC RADICAL PROSTATECTOMY N/A 07/14/2015   One positive pelvic LN.  Procedure: XI ROBOTIC ASSISTED LAPAROSCOPIC RADICAL PROSTATECTOMY LEVEL 2;  Surgeon: Heloise Purpura, MD;  Location: [facility];  Service: Urology;  Laterality: N/A;   ROTATOR CUFF REPAIR  2007   UMBILICAL HERNIA REPAIR  07/14/2015   Procedure: HERNIA REPAIR UMBILICAL ADULT;  Surgeon: Heloise Purpura, MD;  Location: [facility];  Service: Urology;;   Patient Active Problem List   Diagnosis Date Noted   Generalized weakness 09/03/2022   Word finding difficulty 09/03/2022   Worsened handwriting 09/03/2022   Proximal muscle weakness 09/03/2022   Incisional hernia s/p lap repair with mesh 03/19/2019 03/19/2019   Recurrent umbilical hernia s/p lap repair with mesh 03/19/2019 03/19/2019   Bilateral inguinal hernia s/p lap repair with mesh 03/19/2019 03/19/2019   Bilateral femoral hernia s/p lap repair with mesh 03/19/2019 03/19/2019   Overweight (BMI 25.0-29.9) 11/28/2018   Accelerating angina (HCC) 03/21/2017   Elevated blood pressure reading without diagnosis of hypertension 09/23/2015   Prostate cancer (HCC) 07/14/2015   Preventative health care 03/19/2014   Insomnia 03/19/2014   Benign prostatic hyperplasia 09/15/2012   Chronic low back pain 10/15/2011   Gallbladder polyp 10/15/2011   Need for shingles vaccine 10/15/2011   Special screening for malignant neoplasms, colon 07/25/2011   History of colonic polyps 07/25/2011   Benign neoplasm of  colon 07/25/2011   Diverticulosis of colon 07/25/2011   Colon cancer screening 07/12/2011   Hx of colonic polyp 07/12/2011   CAD (coronary artery disease) 07/12/2011   Low back pain 02/13/2011   Prostate cancer screening 02/13/2011   HTN (hypertension) 01/03/2011   Health maintenance examination 10/13/2010   Transaminasemia 10/13/2010   Hyperlipidemia 11/06/2008   Coronary atherosclerosis 11/06/2008   DEGENERATIVE DISC DISEASE 11/06/2008    ONSET DATE: x 1 year  REFERRING DIAG: R53.1 (ICD-10-CM) - Generalized weakness R47.1 (ICD-10-CM) - Dysarthria R26.81 (ICD-10-CM) - Unsteady gait 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 Extensive workup with Dr. Loleta Chance. He is status post second opinion with [facility]. Current diagnosis is corticobasal syndrome. His Sinemet dose was increased and he is now currently taking 2 tablets 3 times a day but he cannot tell any difference yet underwent second opinion neurologic evaluation at the [facility] division of movement disorders on 04/17/2023. 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  History of hypertension, hyperlipidemia, and coronary artery disease  PAIN:  Are you having pain? No, Right shoulder can cause pain with transfers  PRECAUTIONS: Fall  RED FLAGS: None   WEIGHT BEARING RESTRICTIONS:  No  FALLS: Has patient fallen in last 6 months? Yes. Number of falls multiple falls week  LIVING ENVIRONMENT: Lives with: lives with their spouse Lives in: House/apartment Stairs: Yes: Internal: 2 level steps; bilateral but cannot reach both and External: 6 steps; bilateral but cannot reach both Has following equipment at home: Dan Humphreys - 2 wheeled, Wheelchair (manual), shower chair, and Grab bars, recliner lift chair, Chair lift to 2nd floor  PLOF: Independent with basic ADLs and able to dress self in the AM needs assist in PM due to fatigue.  Notes difficulty with transferring into shower, Sleep Number bed  PATIENT GOALS: "move around better"  OBJECTIVE:   TODAY'S TREATMENT: 06/05/23 Activity Comments  Gait training Platform RW                   TODAY'S TREATMENT: 05/29/23 Activity Comments  Transfer training Slideboard- training in sequence for sit-sit w/ tactile and verbal cues for sequence and trunk flexion over BOS Walker-trials with stand-pivot using Ustep  Bed mobility Slick sheet trainiing  Gait training U-step tensioned wheels             Note: Objective measures were completed at Evaluation unless otherwise noted.  DIAGNOSTIC FINDINGS: IMPRESSION: 1. No acute intracranial abnormality. Small left temporal scalp contusion. 2. No acute cervical spine fracture or traumatic malalignment.  COGNITION: Overall cognitive status:  notes his memory seems intact   SENSATION: Not tested  COORDINATION: Deficits with rapid alternating movements Heel to shin impaired RLE > LLE Finger to nose moderately impaired  EDEMA:  none  MUSCLE TONE: NT    POSTURE: forward head  LOWER EXTREMITY ROM:     Active  Right Eval Left Eval  Hip flexion 110 110  Hip extension    Hip abduction    Hip adduction    Hip internal rotation    Hip external rotation    Knee flexion 120 120  Knee extension 0 0  Ankle dorsiflexion 15 15  Ankle plantarflexion 30 30  Ankle  inversion    Ankle eversion     (Blank rows = not tested)  LOWER EXTREMITY MMT:  (resisted tests in sitting)  MMT Right Eval Left Eval  Hip flexion 4 5  Hip extension    Hip abduction 4 5  Hip adduction 5 5  Hip internal rotation    Hip external rotation    Knee flexion 4 5  Knee extension 5 5  Ankle dorsiflexion 5 5  Ankle plantarflexion    Ankle inversion    Ankle eversion    (Blank rows = not tested)  BED MOBILITY:  Sit to supine Min A Supine to sit Min A  TRANSFERS: Assistive device utilized: Environmental consultant - 2 wheeled  Sit  to stand: CGA and Min A Stand to sit: CGA Chair to chair: CGA Floor:  NT    CURB:  Level of Assistance: Min A Assistive device utilized: Environmental consultant - 2 wheeled Curb Comments:   STAIRS: NT--reports use of BHR and 6 stairs for entering/exiting home w/ spouse SBA-CGA  GAIT: Gait pattern: decreased step length- Right, decreased step length- Left, festinating, wide BOS, and abducted- Right Distance walked: 75 Assistive device utilized: Walker - 2 wheeled Level of assistance: CGA and Min A Comments:   FUNCTIONAL TESTS:  Timed up and go (TUG): TBD 10 meter walk test: 1 min 16 sec (76 sec) = 0.43 ft/sec Berg Balance Scale: 23/56  Static standing: Fair- 180 degree turns: 30 sec 360 degree turns: unable                                                                                                                   PATIENT EDUCATION: Education details: assessment details, rationale of PT intervention (restoration vs adaptation/compensation) Person educated: Patient and Spouse Education method: Explanation Education comprehension: verbalized understanding  HOME EXERCISE PROGRAM: TBD  GOALS: Goals reviewed with patient? Yes  SHORT TERM GOALS: Target date: 06/19/2023    Patient will be independent in HEP to improve functional outcomes Baseline: Goal status: INITIAL  2.  Demo/teach-back principles to reduce freezing of gait to improve  safety with turning during gait Baseline: 30 sec to negotiate 180 degree turn Goal status: INITIAL  3.  Demo supervision for bed mobility to reduce level of assistance from caregiver Baseline: min-mod A Goal status: INITIAL  4.  Demo squat-pivot and sit-sit transfers from w/c to seated surfaces w/ supervision to improve safety with mobility Baseline: CGA-min A stand-pivot Goal status: INITIAL    LONG TERM GOALS: Target date: 07/24/2023    Demo improved balance and reduced risk for falls as evidenced by score 30/56 Berg Balance Test Baseline: 23/56 Goal status: INITIAL  2.  Demo ability to ambulate x 150 ft with supervision using least restrictive AD to improve safety with household ambulation Baseline: CGA-min A x 75 ft w/ RW Goal status: INITIAL  3.  Caregiver to demonstrate alternative transfer techniques to improve safety with patient mobility Baseline: e.g. slideboard, transfer disc, slick sheet, etc Goal status: INITIAL    ASSESSMENT:  CLINICAL IMPRESSION: Session focus with platform RW to improve safety with ambulation with initial set-up using platform RW and therapist providing min-mod A for lateral weight shifting to enable improved step height and length to a good effect but with RLE apraxia/ataxia? Causing wide BOS and difficulty maintaining step momentum.  Improved control with application of 3# ankle weights which significantly improved limb control and greatly reduced limb dysmetria.  Able to traverse 30-50 ft increments with this format of ankle weights and therapist providing lateral shift in stance phase to allow step advancement.  Continued sessions to progress strategies to improve sfaeyt with transfers and gait to reduce risk for falls and level of assist from caregivers.   OBJECTIVE IMPAIRMENTS:  Abnormal gait, decreased activity tolerance, decreased balance, decreased coordination, decreased endurance, decreased knowledge of use of DME, decreased mobility,  difficulty walking, decreased strength, impaired UE functional use, and postural dysfunction.   ACTIVITY LIMITATIONS: carrying, lifting, bending, sitting, standing, stairs, transfers, bed mobility, reach over head, and locomotion level  PARTICIPATION LIMITATIONS: cleaning, interpersonal relationship, shopping, community activity, and household activities  PERSONAL FACTORS: Age and Time since onset of injury/illness/exacerbation are also affecting patient's functional outcome.   REHAB POTENTIAL: Good  CLINICAL DECISION MAKING: Evolving/moderate complexity  EVALUATION COMPLEXITY: Moderate  PLAN:  PT FREQUENCY: 1x/week  PT DURATION: 8 weeks  PLANNED INTERVENTIONS: 97110-Therapeutic exercises, 97530- Therapeutic activity, 97112- Neuromuscular re-education, [phone]- Self Care, 82956- Manual therapy, [phone]- Gait training, [phone]- Orthotic Fit/training, [phone]- Canalith repositioning, [phone]- Aquatic Therapy, Patient/Family education, Balance training, Stair training, Joint mobilization, Spinal mobilization, Vestibular training, DME instructions, Wheelchair mobility training, Cryotherapy, and Moist heat  PLAN FOR NEXT SESSION: gait trials with platform RW, freezing of gait techniques, slideboard transfer with spouse, squat-stand-pivot transfers w/ slide on foot? Initiate activities for HEP   2:37 PM, 06/05/23 M. Shary Decamp, PT, DPT Physical Therapist- [place] Office Number: [phone]

## 2023-06-10 ENCOUNTER — Encounter: Payer: Self-pay | Admitting: Neurology

## 2023-06-11 NOTE — Therapy (Incomplete)
OUTPATIENT PHYSICAL THERAPY NEURO TREATMENT   Patient Name: William Montgomery MRN: [id number] DOB:[birth year], 83 y.o., male Today's Date: 06/11/2023   PCP: Jeoffrey Massed, MD REFERRING PROVIDER: Jeoffrey Massed, MD  END OF SESSION:    Past Medical History:  Diagnosis Date   Arthritis    joints/  back/  neck   Chronic headaches    due to cervical spondylosis   Chronic renal insufficiency, stage 2 (mild)    borderlines II/III   Coronary artery disease 1999   cardiologist--- dr Antoine Poche---  (per cardiology note pt refused to take asa/ statin) hx PCI w/ stenting in 1999 to LAD and 12-23-2007 x2 DES to proximal LAD;  Cath 03/2017-->  non- critical 70 - 80% mid/distal circ stenosis,  chronic small D1 80% stenosis (stable) and DES to proximal and mid LAD--   Med mgmt rec'd-->nitrates caused HAs, ACE-I caused cough.   Corticobasal syndrome (HCC)    [facility] 04/2023   DDD (degenerative disc disease)    lumbar and cervical neck   Diverticulosis of sigmoid colon 07/25/2011   Severe (endoscopy by Dr. Jarold Motto)   Erectile dysfunction after radical prostatectomy    History of kidney stones    History of prostate cancer 12/2014   urologist-- dr Retta Diones---   s/p Robot assisted radical prostatectomy 07/2015: lymph node involvement//   undetactable PSA since   HTN (hypertension)    ACE-I cough.  Fine on ARB.   Hx of blastomycosis 1978   s/p  right upper lobectomy   Hyperlipidemia    NASH (nonalcoholic steatohepatitis) 06/2011   normal LFTS  /  Hep B and C testing neg.  Mild fatty liver on CT 2020   Nocturia    Parkinsonism (HCC)    2023/24, unknown etiology (Dat scan neg), peripheral nerve bx, and spinal fluid testing all NEG/Norma. [facility] 2nd opinion-->corticobasal syndrome 04/2023)   Pseudobulbar affect    Pulmonary nodules    small, picked up on CT ab/pelv for abd pains/flank pains-->pt quit smoking 40 yrs ago.  Low risk for lung ca so no f/u CT needed.   Recurrent  depression (HCC)    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   Tinnitus    Wears glasses    Wears hearing aid in both ears    Past Surgical History:  Procedure Laterality Date   ANTERIOR CERVICAL DECOMP/DISCECTOMY FUSION  1982   C6--7   APPENDECTOMY  1953   CARDIAC CATHETERIZATION  02/27/2000   @[facility]  by dr hochrein ---  single vessel w/ preserved LVSF   CORONARY ANGIOPLASTY WITH STENT PLACEMENT  12/23/2007   by Dr End--- PCI w/ DES x2 to  pLAD   CORONARY STENT PLACEMENT  1999   PTCA w/ stenting to LAd   CYSTOSCOPY WITH RETROGRADE PYELOGRAM, URETEROSCOPY AND STENT PLACEMENT Left 05/10/2018   Procedure: CYSTOSCOPY WITH LEFT RETROGRADE PYELOGRAM, LEFT URETEROSCOPY AND LEFT URETERAL STENT PLACEMENT;  Surgeon: Sebastian Ache, MD;  Location: [facility];  Service: Urology;  Laterality: Left;   HOLMIUM LASER APPLICATION Left 05/10/2018   Procedure: HOLMIUM LASER APPLICATION;  Surgeon: Sebastian Ache, MD;  Location: [facility];  Service: Urology;  Laterality: Left;   INCISIONAL HERNIA REPAIR N/A 03/19/2019   Procedure: LAPAROSCOPIC INCISIONAL HERNIA REPAIR WITH MESH, RECURRENT UMBILICAL HERNIA REPAIR;  Surgeon: Karie Soda, MD;  Location: [facility];  Service: General;  Laterality: N/A;   INGUINAL HERNIA REPAIR Right 03/19/2019   Procedure: LAPAROSCOPIC BILATERAL FEMORAL AND INGUINAL HERNIA REPAIR WITH MESH, LYSIS OF ADHESIONS;  Surgeon: Karie Soda, MD;  Location: [facility];  Service: General;  Laterality: Right;   LEFT HEART CATH AND CORONARY ANGIOGRAPHY N/A 03/21/2017   Stents patent; mod circ dz, nothing for intervention--imdur added.  Procedure: LEFT HEART CATH AND CORONARY ANGIOGRAPHY;  Surgeon: Yvonne Kendall, MD;  [facility]: [facility];  Service: Cardiovascular;  Laterality: N/A;   LUMBAR SPINE SURGERY  1978   L5--S1   LUNG LOBECTOMY Right 1978    right upper lobectomy for blastomycosis   LYMPHADENECTOMY Bilateral 07/14/2015   Procedure: LYMPHADENECTOMY;  Surgeon: Heloise Purpura, MD;  Location: [facility];  Service: Urology;  Laterality: Bilateral;   ROBOT ASSISTED LAPAROSCOPIC RADICAL PROSTATECTOMY N/A 07/14/2015   One positive pelvic LN.  Procedure: XI ROBOTIC ASSISTED LAPAROSCOPIC RADICAL PROSTATECTOMY LEVEL 2;  Surgeon: Heloise Purpura, MD;  Location: [facility];  Service: Urology;  Laterality: N/A;   ROTATOR CUFF REPAIR  2007   UMBILICAL HERNIA REPAIR  07/14/2015   Procedure: HERNIA REPAIR UMBILICAL ADULT;  Surgeon: Heloise Purpura, MD;  Location: [facility];  Service: Urology;;   Patient Active Problem List   Diagnosis Date Noted   Generalized weakness 09/03/2022   Word finding difficulty 09/03/2022   Worsened handwriting 09/03/2022   Proximal muscle weakness 09/03/2022   Incisional hernia s/p lap repair with mesh 03/19/2019 03/19/2019   Recurrent umbilical hernia s/p lap repair with mesh 03/19/2019 03/19/2019   Bilateral inguinal hernia s/p lap repair with mesh 03/19/2019 03/19/2019   Bilateral femoral hernia s/p lap repair with mesh 03/19/2019 03/19/2019   Overweight (BMI 25.0-29.9) 11/28/2018   Accelerating angina (HCC) 03/21/2017   Elevated blood pressure reading without diagnosis of hypertension 09/23/2015   Prostate cancer (HCC) 07/14/2015   Preventative health care 03/19/2014   Insomnia 03/19/2014   Benign prostatic hyperplasia 09/15/2012   Chronic low back pain 10/15/2011   Gallbladder polyp 10/15/2011   Need for shingles vaccine 10/15/2011   Special screening for malignant neoplasms, colon 07/25/2011   History of colonic polyps 07/25/2011   Benign neoplasm of colon 07/25/2011   Diverticulosis of colon 07/25/2011   Colon cancer screening 07/12/2011   Hx of colonic polyp 07/12/2011   CAD (coronary artery disease) 07/12/2011   Low back pain 02/13/2011   Prostate cancer screening 02/13/2011   HTN (hypertension) 01/03/2011    Health maintenance examination 10/13/2010   Transaminasemia 10/13/2010   Hyperlipidemia 11/06/2008   Coronary atherosclerosis 11/06/2008   DEGENERATIVE DISC DISEASE 11/06/2008    ONSET DATE: x 1 year  REFERRING DIAG: R53.1 (ICD-10-CM) - Generalized weakness R47.1 (ICD-10-CM) - Dysarthria R26.81 (ICD-10-CM) - Unsteady gait 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  Rationale for Evaluation and Treatment: Rehabilitation  SUBJECTIVE:  SUBJECTIVE STATEMENT:  No new issues to report, difficulty using slideboard from uneven surfaces  Pt accompanied by: significant other  PERTINENT HISTORY: progressive generalized weakness with gait abnormality and dysarthria. Extensive workup with Dr. Loleta Chance. He is status post second opinion with [facility]. Current diagnosis is corticobasal syndrome. His Sinemet dose was increased and he is now currently taking 2 tablets 3 times a day but he cannot tell any difference yet underwent second opinion neurologic evaluation at the [facility] division of movement disorders on 04/17/2023. 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  History of hypertension, hyperlipidemia, and coronary artery disease  PAIN:  Are you having pain? No, Right shoulder can cause pain with transfers  PRECAUTIONS: Fall  RED FLAGS: None   WEIGHT BEARING RESTRICTIONS: No  FALLS: Has patient fallen in last 6 months? Yes. Number of falls multiple falls week  LIVING ENVIRONMENT: Lives with: lives with their spouse Lives in: House/apartment Stairs: Yes: Internal: 2 level steps; bilateral but cannot reach both and External: 6 steps; bilateral but cannot reach both Has following equipment at home: Dan Humphreys - 2 wheeled, Wheelchair (manual), shower chair, and Grab bars,  recliner lift chair, Chair lift to 2nd floor  PLOF: Independent with basic ADLs and able to dress self in the AM needs assist in PM due to fatigue.  Notes difficulty with transferring into shower, Sleep Number bed  PATIENT GOALS: "move around better"  OBJECTIVE:     TODAY'S TREATMENT: 06/12/23 Activity Comments                          Note: Objective measures were completed at Evaluation unless otherwise noted.  DIAGNOSTIC FINDINGS: IMPRESSION: 1. No acute intracranial abnormality. Small left temporal scalp contusion. 2. No acute cervical spine fracture or traumatic malalignment.  COGNITION: Overall cognitive status:  notes his memory seems intact   SENSATION: Not tested  COORDINATION: Deficits with rapid alternating movements Heel to shin impaired RLE > LLE Finger to nose moderately impaired  EDEMA:  none  MUSCLE TONE: NT    POSTURE: forward head  LOWER EXTREMITY ROM:     Active  Right Eval Left Eval  Hip flexion 110 110  Hip extension    Hip abduction    Hip adduction    Hip internal rotation    Hip external rotation    Knee flexion 120 120  Knee extension 0 0  Ankle dorsiflexion 15 15  Ankle plantarflexion 30 30  Ankle inversion    Ankle eversion     (Blank rows = not tested)  LOWER EXTREMITY MMT:  (resisted tests in sitting)  MMT Right Eval Left Eval  Hip flexion 4 5  Hip extension    Hip abduction 4 5  Hip adduction 5 5  Hip internal rotation    Hip external rotation    Knee flexion 4 5  Knee extension 5 5  Ankle dorsiflexion 5 5  Ankle plantarflexion    Ankle inversion    Ankle eversion    (Blank rows = not tested)  BED MOBILITY:  Sit to supine Min A Supine to sit Min A  TRANSFERS: Assistive device utilized: Environmental consultant - 2 wheeled  Sit to stand: CGA and Min A Stand to sit: CGA Chair to chair: CGA Floor:  NT    CURB:  Level of Assistance: Min A Assistive device utilized: Environmental consultant - 2 wheeled Curb Comments:    STAIRS: NT--reports use of BHR and 6 stairs for entering/exiting home w/ spouse  SBA-CGA  GAIT: Gait pattern: decreased step length- Right, decreased step length- Left, festinating, wide BOS, and abducted- Right Distance walked: 75 Assistive device utilized: Walker - 2 wheeled Level of assistance: CGA and Min A Comments:   FUNCTIONAL TESTS:  Timed up and go (TUG): TBD 10 meter walk test: 1 min 16 sec (76 sec) = 0.43 ft/sec Berg Balance Scale: 23/56  Static standing: Fair- 180 degree turns: 30 sec 360 degree turns: unable                                                                                                                   PATIENT EDUCATION: Education details: assessment details, rationale of PT intervention (restoration vs adaptation/compensation) Person educated: Patient and Spouse Education method: Explanation Education comprehension: verbalized understanding  HOME EXERCISE PROGRAM: TBD  GOALS: Goals reviewed with patient? Yes  SHORT TERM GOALS: Target date: 06/19/2023    Patient will be independent in HEP to improve functional outcomes Baseline: Goal status: INITIAL  2.  Demo/teach-back principles to reduce freezing of gait to improve safety with turning during gait Baseline: 30 sec to negotiate 180 degree turn Goal status: INITIAL  3.  Demo supervision for bed mobility to reduce level of assistance from caregiver Baseline: min-mod A Goal status: INITIAL  4.  Demo squat-pivot and sit-sit transfers from w/c to seated surfaces w/ supervision to improve safety with mobility Baseline: CGA-min A stand-pivot Goal status: INITIAL    LONG TERM GOALS: Target date: 07/24/2023    Demo improved balance and reduced risk for falls as evidenced by score 30/56 Berg Balance Test Baseline: 23/56 Goal status: INITIAL  2.  Demo ability to ambulate x 150 ft with supervision using least restrictive AD to improve safety with household ambulation Baseline: CGA-min  A x 75 ft w/ RW Goal status: INITIAL  3.  Caregiver to demonstrate alternative transfer techniques to improve safety with patient mobility Baseline: e.g. slideboard, transfer disc, slick sheet, etc Goal status: INITIAL    ASSESSMENT:  CLINICAL IMPRESSION: Session focus with platform RW to improve safety with ambulation with initial set-up using platform RW and therapist providing min-mod A for lateral weight shifting to enable improved step height and length to a good effect but with RLE apraxia/ataxia? Causing wide BOS and difficulty maintaining step momentum.  Improved control with application of 3# ankle weights which significantly improved limb control and greatly reduced limb dysmetria.  Able to traverse 30-50 ft increments with this format of ankle weights and therapist providing lateral shift in stance phase to allow step advancement.  Continued sessions to progress strategies to improve sfaeyt with transfers and gait to reduce risk for falls and level of assist from caregivers.   OBJECTIVE IMPAIRMENTS: Abnormal gait, decreased activity tolerance, decreased balance, decreased coordination, decreased endurance, decreased knowledge of use of DME, decreased mobility, difficulty walking, decreased strength, impaired UE functional use, and postural dysfunction.   ACTIVITY LIMITATIONS: carrying, lifting, bending, sitting, standing, stairs, transfers, bed mobility, reach over head, and locomotion level  PARTICIPATION  LIMITATIONS: cleaning, interpersonal relationship, shopping, community activity, and household activities  PERSONAL FACTORS: Age and Time since onset of injury/illness/exacerbation are also affecting patient's functional outcome.   REHAB POTENTIAL: Good  CLINICAL DECISION MAKING: Evolving/moderate complexity  EVALUATION COMPLEXITY: Moderate  PLAN:  PT FREQUENCY: 1x/week  PT DURATION: 8 weeks  PLANNED INTERVENTIONS: 97110-Therapeutic exercises, 97530- Therapeutic  activity, 97112- Neuromuscular re-education, [phone]- Self Care, 40981- Manual therapy, [phone]- Gait training, [phone]- Orthotic Fit/training, [phone]- Canalith repositioning, [phone]- Aquatic Therapy, Patient/Family education, Balance training, Stair training, Joint mobilization, Spinal mobilization, Vestibular training, DME instructions, Wheelchair mobility training, Cryotherapy, and Moist heat  PLAN FOR NEXT SESSION: gait trials with platform RW, freezing of gait techniques, slideboard transfer with spouse, squat-stand-pivot transfers w/ slide on foot? Initiate activities for HEP

## 2023-06-12 ENCOUNTER — Ambulatory Visit: Payer: Medicare Other | Admitting: Physical Therapy

## 2023-06-12 ENCOUNTER — Encounter: Payer: Commercial Managed Care - PPO | Admitting: Occupational Therapy

## 2023-06-19 ENCOUNTER — Ambulatory Visit: Payer: Medicare Other

## 2023-06-19 ENCOUNTER — Encounter: Payer: Commercial Managed Care - PPO | Admitting: Occupational Therapy

## 2023-06-19 ENCOUNTER — Ambulatory Visit: Payer: Medicare Other | Admitting: Physical Therapy

## 2023-06-24 ENCOUNTER — Other Ambulatory Visit: Payer: Self-pay

## 2023-06-26 ENCOUNTER — Other Ambulatory Visit: Payer: Self-pay | Admitting: Neurology

## 2023-06-26 ENCOUNTER — Encounter: Payer: Commercial Managed Care - PPO | Admitting: Occupational Therapy

## 2023-06-26 ENCOUNTER — Ambulatory Visit: Payer: Commercial Managed Care - PPO | Admitting: Physical Therapy

## 2023-06-26 DIAGNOSIS — F482 Pseudobulbar affect: Secondary | ICD-10-CM

## 2023-07-03 ENCOUNTER — Ambulatory Visit: Payer: Commercial Managed Care - PPO

## 2023-07-03 ENCOUNTER — Encounter: Payer: Commercial Managed Care - PPO | Admitting: Occupational Therapy

## 2023-07-05 ENCOUNTER — Other Ambulatory Visit (HOSPITAL_COMMUNITY): Payer: Self-pay

## 2023-07-05 NOTE — Telephone Encounter (Signed)
 No PA required. Co-pay is $20.00

## 2023-07-08 ENCOUNTER — Other Ambulatory Visit (HOSPITAL_BASED_OUTPATIENT_CLINIC_OR_DEPARTMENT_OTHER): Payer: Self-pay

## 2023-07-09 ENCOUNTER — Other Ambulatory Visit: Payer: Self-pay | Admitting: Neurology

## 2023-07-09 ENCOUNTER — Other Ambulatory Visit (HOSPITAL_BASED_OUTPATIENT_CLINIC_OR_DEPARTMENT_OTHER): Payer: Self-pay

## 2023-07-09 MED ORDER — NUEDEXTA 20-10 MG PO CAPS
1.0000 | ORAL_CAPSULE | Freq: Two times a day (BID) | ORAL | 11 refills | Status: DC
Start: 2023-07-09 — End: 2024-01-14
  Filled 2023-07-09: qty 60, 30d supply, fill #0
  Filled 2023-08-04: qty 60, 30d supply, fill #1
  Filled 2023-08-31: qty 60, 30d supply, fill #2
  Filled 2023-09-28: qty 60, 30d supply, fill #3
  Filled 2023-10-30: qty 60, 30d supply, fill #4
  Filled 2023-11-28: qty 60, 30d supply, fill #5
  Filled 2023-12-27: qty 60, 30d supply, fill #6

## 2023-07-10 ENCOUNTER — Ambulatory Visit: Payer: Commercial Managed Care - PPO

## 2023-07-10 ENCOUNTER — Encounter: Payer: Commercial Managed Care - PPO | Admitting: Occupational Therapy

## 2023-07-16 ENCOUNTER — Encounter (INDEPENDENT_AMBULATORY_CARE_PROVIDER_SITE_OTHER): Payer: Medicare Other | Admitting: Ophthalmology

## 2023-07-17 ENCOUNTER — Encounter (INDEPENDENT_AMBULATORY_CARE_PROVIDER_SITE_OTHER): Payer: Medicare Other | Admitting: Ophthalmology

## 2023-07-23 ENCOUNTER — Other Ambulatory Visit: Payer: Self-pay | Admitting: Family Medicine

## 2023-07-23 MED ORDER — ALPRAZOLAM 1 MG PO TABS
1.0000 mg | ORAL_TABLET | Freq: Every day | ORAL | 5 refills | Status: DC | PRN
  Filled 2023-07-23: qty 30, 30d supply, fill #0
  Filled 2023-08-31: qty 30, 30d supply, fill #1
  Filled 2023-09-28: qty 30, 30d supply, fill #2
  Filled 2023-10-30 – 2023-10-31 (×2): qty 30, 30d supply, fill #3
  Filled 2023-11-26 – 2023-11-29 (×2): qty 30, 30d supply, fill #4
  Filled 2023-12-27: qty 30, 30d supply, fill #5

## 2023-07-23 NOTE — Telephone Encounter (Signed)
 Requesting: ALPRAZolam (XANAX) 1 MG tablet  Contract: n/a UDS: n/a Last Visit: 05/23/23  Next Visit: PRN Last Refill: 05/23/23 (30,1)  Please Advise. Med pending

## 2023-07-24 ENCOUNTER — Other Ambulatory Visit (HOSPITAL_BASED_OUTPATIENT_CLINIC_OR_DEPARTMENT_OTHER): Payer: Self-pay

## 2023-08-05 ENCOUNTER — Other Ambulatory Visit (HOSPITAL_BASED_OUTPATIENT_CLINIC_OR_DEPARTMENT_OTHER): Payer: Self-pay

## 2023-08-05 ENCOUNTER — Encounter: Payer: Self-pay | Admitting: Family Medicine

## 2023-08-05 ENCOUNTER — Ambulatory Visit (INDEPENDENT_AMBULATORY_CARE_PROVIDER_SITE_OTHER): Payer: Commercial Managed Care - PPO | Admitting: Family Medicine

## 2023-08-05 ENCOUNTER — Other Ambulatory Visit: Payer: Self-pay

## 2023-08-05 VITALS — BP 127/73 | HR 54 | Ht 69.0 in | Wt 165.4 lb

## 2023-08-05 DIAGNOSIS — F410 Panic disorder [episodic paroxysmal anxiety] without agoraphobia: Secondary | ICD-10-CM

## 2023-08-05 DIAGNOSIS — M79605 Pain in left leg: Secondary | ICD-10-CM | POA: Diagnosis not present

## 2023-08-05 DIAGNOSIS — M79604 Pain in right leg: Secondary | ICD-10-CM

## 2023-08-05 MED ORDER — OXYCODONE HCL 5 MG PO TABS
5.0000 mg | ORAL_TABLET | Freq: Three times a day (TID) | ORAL | 0 refills | Status: DC | PRN
Start: 2023-08-05 — End: 2023-08-31
  Filled 2023-08-05: qty 30, 10d supply, fill #0

## 2023-08-05 MED ORDER — MELOXICAM 15 MG PO TABS
15.0000 mg | ORAL_TABLET | Freq: Every day | ORAL | 2 refills | Status: DC
  Filled 2023-08-05: qty 30, 30d supply, fill #0
  Filled 2023-09-18: qty 30, 30d supply, fill #1
  Filled 2023-10-30: qty 30, 30d supply, fill #2

## 2023-08-05 NOTE — Progress Notes (Signed)
 OFFICE VISIT  08/05/2023  CC:  Chief Complaint  Patient presents with   Medical Management of Chronic Issues    Patient is a 83 y.o. male who presents accompanied by his wife Steward Drone for 43-month follow-up hypertension and anxiety. A/P as of last visit: "#1 history of hypertension. Mild elevated blood pressure today. He has chosen to defer medications.   #2 hypercholesterolemia, last lipid check about 8 months ago was pretty good but LDL still not to goal of less than 70. He chooses to defer any medication for this.   #3 progressive generalized weakness with gait abnormality and dysarthria. Extensive workup with Dr. Loleta Chance. He is status post second opinion with [facility]. Current diagnosis is corticobasal syndrome. His Sinemet dose was increased and he is now currently taking 2 tablets 3 times a day but he cannot tell any difference yet. He will be starting home health PT, OT, and speech therapy next month."  INTERIM HX: Status is about the same.  He no longer ambulates at all.  He needs quite a bit of assistance to even transfer from chair to bed. He has a lot of trouble initiating movement with his legs, feels like they are "foreign". He has general aching in both legs that starts around the mid thigh area and goes down into about just below the knee.  Denies stabbing/shooting pains and denies any sensation of muscle spasms.  No tingling or numbness in the legs. No rash, no joint swelling. No fevers. He has a lot of stiffness in the legs.  On 05/23/2023 I started him on Xanax 1 mg to take daily as needed for episodic severe situational anxiety.  He takes this every night and it helps very well with keeping panic attacks from happening.  He occasionally has had to take an extra at night. He denies depressed mood.  He endorses some frustration and anger at not being able to do anything he used to be able to do.   PMP AWARE reviewed today: most recent rx for alprazolam 1 mg was  filled 07/24/2023, # 30, rx by me. No red flags.  Past Medical History:  Diagnosis Date   Arthritis    joints/  back/  neck   Chronic headaches    due to cervical spondylosis   Chronic renal insufficiency, stage 2 (mild)    borderlines II/III   Coronary artery disease 1999   cardiologist--- dr Antoine Poche---  (per cardiology note pt refused to take asa/ statin) hx PCI w/ stenting in 1999 to LAD and 12-23-2007 x2 DES to proximal LAD;  Cath 03/2017-->  non- critical 70 - 80% mid/distal circ stenosis,  chronic small D1 80% stenosis (stable) and DES to proximal and mid LAD--   Med mgmt rec'd-->nitrates caused HAs, ACE-I caused cough.   Corticobasal syndrome (HCC)    [facility] 04/2023   DDD (degenerative disc disease)    lumbar and cervical neck   Diverticulosis of sigmoid colon 07/25/2011   Severe (endoscopy by Dr. Jarold Motto)   Erectile dysfunction after radical prostatectomy    History of kidney stones    History of prostate cancer 12/2014   urologist-- dr Retta Diones---   s/p Robot assisted radical prostatectomy 07/2015: lymph node involvement//   undetactable PSA since   HTN (hypertension)    ACE-I cough.  Fine on ARB.   Hx of blastomycosis 1978   s/p  right upper lobectomy   Hyperlipidemia    NASH (nonalcoholic steatohepatitis) 06/2011   normal LFTS  /  Hep B and C testing neg.  Mild fatty liver on CT 2020   Nocturia    Parkinsonism (HCC)    2023/24, unknown etiology (Dat scan neg), peripheral nerve bx, and spinal fluid testing all NEG/Norma. [facility] 2nd opinion-->corticobasal syndrome 04/2023)   Pseudobulbar affect    Pulmonary nodules    small, picked up on CT ab/pelv for abd pains/flank pains-->pt quit smoking 40 yrs ago.  Low risk for lung ca so no f/u CT needed.   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    Tinnitus    Wears glasses    Wears hearing aid in both ears     Past Surgical History:  Procedure Laterality Date   ANTERIOR CERVICAL DECOMP/DISCECTOMY FUSION  1982   C6--7   APPENDECTOMY  1953   CARDIAC CATHETERIZATION  02/27/2000   @[facility]  by dr hochrein ---  single vessel w/ preserved LVSF   CORONARY ANGIOPLASTY WITH STENT PLACEMENT  12/23/2007   by Dr End--- PCI w/ DES x2 to  pLAD   CORONARY STENT PLACEMENT  1999   PTCA w/ stenting to LAd   CYSTOSCOPY WITH RETROGRADE PYELOGRAM, URETEROSCOPY AND STENT PLACEMENT Left 05/10/2018   Procedure: CYSTOSCOPY WITH LEFT RETROGRADE PYELOGRAM, LEFT URETEROSCOPY AND LEFT URETERAL STENT PLACEMENT;  Surgeon: Sebastian Ache, MD;  Location: [facility];  Service: Urology;  Laterality: Left;   HOLMIUM LASER APPLICATION Left 05/10/2018   Procedure: HOLMIUM LASER APPLICATION;  Surgeon: Sebastian Ache, MD;  Location: [facility];  Service: Urology;  Laterality: Left;   INCISIONAL HERNIA REPAIR N/A 03/19/2019   Procedure: LAPAROSCOPIC INCISIONAL HERNIA REPAIR WITH MESH, RECURRENT UMBILICAL HERNIA REPAIR;  Surgeon: Karie Soda, MD;  Location: [facility];  Service: General;  Laterality: N/A;   INGUINAL HERNIA REPAIR Right 03/19/2019   Procedure: LAPAROSCOPIC BILATERAL FEMORAL AND INGUINAL HERNIA REPAIR WITH MESH, LYSIS OF ADHESIONS;  Surgeon: Karie Soda, MD;  Location: [facility];  Service: General;  Laterality: Right;   LEFT HEART CATH AND CORONARY ANGIOGRAPHY N/A 03/21/2017   Stents patent; mod circ dz, nothing for intervention--imdur added.  Procedure: LEFT HEART CATH AND CORONARY ANGIOGRAPHY;  Surgeon: Yvonne Kendall, MD;  Location: [facility];  Service: Cardiovascular;  Laterality: N/A;   LUMBAR SPINE SURGERY  1978   L5--S1   LUNG LOBECTOMY Right 1978   right upper lobectomy for blastomycosis   LYMPHADENECTOMY Bilateral 07/14/2015   Procedure: LYMPHADENECTOMY;  Surgeon: Heloise Purpura, MD;  Location: [facility];  Service: Urology;  Laterality: Bilateral;    ROBOT ASSISTED LAPAROSCOPIC RADICAL PROSTATECTOMY N/A 07/14/2015   One positive pelvic LN.  Procedure: XI ROBOTIC ASSISTED LAPAROSCOPIC RADICAL PROSTATECTOMY LEVEL 2;  Surgeon: Heloise Purpura, MD;  Location: [facility];  Service: Urology;  Laterality: N/A;   ROTATOR CUFF REPAIR  2007   UMBILICAL HERNIA REPAIR  07/14/2015   Procedure: HERNIA REPAIR UMBILICAL ADULT;  Surgeon: Heloise Purpura, MD;  Location: [facility];  Service: Urology;;    Outpatient Medications Prior to Visit  Medication Sig Dispense Refill   ALPRAZolam (XANAX) 1 MG tablet Take 1 tablet (1 mg total) by mouth daily as needed for severe anxiety. 30 tablet 5   carbidopa-levodopa (SINEMET IR) 25-100 MG tablet Take up to 2 tablets by mouth three times daily  Per schedule given 540 tablet 1  Dextromethorphan-quiNIDine (NUEDEXTA) 20-10 MG capsule Take 1 capsule by mouth 2 (two) times daily. 60 capsule 11   No facility-administered medications prior to visit.    Allergies  Allergen Reactions   Ace Inhibitors Cough   Imdur [Isosorbide Dinitrate] Other (See Comments)    Headache    Review of Systems As per HPI  PE:    08/05/2023   12:59 PM 05/23/2023   10:04 AM 05/03/2023    2:38 PM  Vitals with BMI  Height 5\' 9"   5\' 9"   Weight 165 lbs 6 oz 169 lbs 169 lbs 3 oz  BMI 24.41 24.95 24.98  Systolic 127 117 829  Diastolic 73 75 80  Pulse 54 60 57     Physical Exam  Gen: Alert, well appearing.  Patient is oriented to person, place, time, and situation. Sitting up in wheelchair. He has some hypertonicity in the thighs and calves but no contractures. He has no crepitus or pain with range of motion of the knees.  No erythema, warmth, swelling, or tenderness of the knees.  Squeezing the thighs and calves does not elicit any pain. He has no edema.  LABS:  Last metabolic panel Lab Results  Component Value Date   GLUCOSE 116 (H) 09/03/2022   NA 136 09/03/2022   K 3.9 09/03/2022   CL 99 09/03/2022   CO2 26 09/03/2022   BUN 22  09/03/2022   CREATININE 1.02 09/03/2022   GFRNONAA >60 09/03/2022   CALCIUM 8.9 09/03/2022   PROT 7.2 09/03/2022   ALBUMIN 4.8 01/30/2023   LABGLOB 2.8 09/04/2022   BILITOT 0.7 09/03/2022   ALKPHOS 86 09/03/2022   AST 23 09/03/2022   ALT 26 09/03/2022   ANIONGAP 11 09/03/2022   IMPRESSION AND PLAN:  #1 panic attacks. These were exclusively at night and he is doing very well with alprazolam 1 mg every night.  2) Progressive generalized weakness with gait abnormality and dysarthria. Extensive workup with Dr. Loleta Chance. He is status post second opinion with [facility]. Current diagnosis is corticobasal syndrome. He is essentially immobile now, confined to wheelchair and bed.  He has chosen to d/c PT and ST. He will be getting a motorized wheelchair soon that will allow much more flexibility in his body position and assist with transferring. He does not think the Sinemet 2 tabs 3 times daily is helping any.  He sometimes goes a week or so without it and his wife does note that his inability to initiate movement with his legs gets worse. He has no side effects from it. He has neurology follow-up pretty soon.  3.  Bilateral legs aching. Unknown etiology but probably due to stiffness and immobility from his disease as well as sitting in a wheelchair and not moving legs for hours at a time. Trial of meloxicam 15 mg a day, #30, refill x 2. Oxycodone 5mg , 1 tid prn severe pain, #30.  An After Visit Summary was printed and given to the patient.  FOLLOW UP: Return in about 3 months (around 11/04/2023) for routine chronic illness f/u.  Signed:  Santiago Bumpers, MD           08/05/2023

## 2023-08-27 NOTE — Progress Notes (Addendum)
 NEUROLOGY FOLLOW UP OFFICE NOTE  William Montgomery [id number]  Subjective:  William Montgomery is a 83 y.o. year old male with a history of cervical and lumbar degenerative disc disease s/p ACDF (C6-7 and L5-S1), OA, chronic headaches, CAD, HTN, CKD, HLD, NASH, hearing loss, prostate cancer who we last saw on 03/06/23 for imbalance, falls, pseudobulbar affect, and dysarthria.  To briefly review: 09/05/22: He had a headache on his right side of head/neck, behind the ear that was severe in earlier in 2023. He had a CT head and cervical spine. Head was unremarkable, but cervical spine showed severe cervical spondylosis. He had dry needling and injections in the neck without benefit in pain. He was then diagnosed with occipital neuralgia by PCP. He got an injection with immediate relief (12/2021). The headache came back after a few weeks. When he got another shot, it did not help.    Imbalance started in 02/2022. He saw his PCP who recommended exercise and monitoring. The symptoms continued to progress though. He feels like his balance is getting worse on a daily basis. He is shuffling while walking to get around. He endorses falls, about 3 falls in the last month, working in yard. It is more when leaning over or to the side.   He feels he is getting worse rapidly. He has noticed slurred speech and word finding difficulty a couple of months ago that he feels is getting worse recently. He has difficulty writing, and his signature looks small and not like his signature normally would. He has difficulty getting in and out of chairs. Symptoms gets worse as the day goes on usually. When he gets close to a step, he will find himself stopping, having to think about picking his leg up and having difficulty with this. He is very emotional, including tearing up during the history taking. Wife says this is not like him. He feels more depressed due to symptoms and has a lot of anxiety. He was prescribed propranolol  for  this but he has never taken it.   Per wife, patient can have leg jerking when falling asleep or when in a deep sleep. There is no clear REM sleep behavior though. Patient does not feel like he sleeps well though. He has difficulty falling asleep (was taking CBD to help with this). He denies any changes to smell.   He denies ptosis, diplopia, difficulty chewing or swallowing. He denies orthopnea. He feels weaker but denies clear muscle atrophy, cramps, or twitching.   Pseudobulbar affect is present.   The patient does not report symptoms referable to autonomic dysfunction including impaired sweating, heat or cold intolerance, excessive mucosal dryness, gastroparetic early satiety, postprandial abdominal bloating, constipation, bowel or bladder dyscontrol, or syncope/presyncope/orthostatic intolerance.   The patient has not noticed any recent skin rashes nor does he report any constitutional symptoms like fever, night sweats, anorexia or unintentional weight loss.   Patient was advised to go to ED on 09/03/22 by PCP. Patient was admitted for more expedited work up from 09/03/22-09/04/22. 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 Labs were significant for CRP of 9.4, normal ESR, A1c of 5.2, normal TSH, CBC with MCV of 102 (B12 on 08/23/22 was 648). Other labs are pending. MRI brain showed no acute process with chronic small vessel ischemia and volume loss.   EtOH use: Very rare  Restrictive diet? No Family history of neuropathy/myopathy/neurologic disease? Brother with seizure disorder who passed away recently  Patient is most concerned he is getting dementia, parkinson's disease, or ALS.   10/10/22: Pending labs from hospitalization were normal (including vit E, copper , zinc , AChR ab, folate, IFE).    Patient did well on the sample of Neudexta, feeling it helped his mood, so this was prescribed on 09/10/22. This continues to work well.   He was  tolerating Sinemet  but not sure if any difference had been made. Sinemet  was increased to 1 tablet TID on 09/18/22 as he felt there may have been a small improvement in the fluidity of his movements. He is taking Sinemet  at 6am, 1pm, and 7 pm. Patient is not sure if the Sinemet  is changing any symptoms. He does not notice on or off symptoms. He denies any side effects to Sinemet  or Neudexta. Wife thinks it may have helped a little. He continues to freeze, especially when approaching steps. Patient has had a couple of minor falls. One when he was on one knee fixing something and fell over. He also fell over when reaching over to get a package on the ground.   Patient denies any worsening of symptoms since last clinic visit. He denies difficulty swallowing. His speech is similar to prior. He denies significant tremors. He denies changes in sleep or concern for REM sleep disorders.   Patient started PT. He continues to go to the gym 4-5 times per week.   11/29/22: DaTSCAN  was within normal limits.    Over the last 2 weeks, symptoms have worsened. His walking is worse. He is having a hard time getting on or off the elliptical. His legs are freezing up and not moving. He has had a couple of falls since last visit. He continues to take Sinemet  1 tablet TID (Sinemet  at 5-6 am, 12-1 pm, 6-7 pm).    He also mentions his smell isn't as good as it used to be and his nose tends to run more. He denies obvious tremor. He may have some rest tremor in hands (pill rolling) but this is not clear. He has some leg jerks during sleep, but denies any REM sleep disorder like behavior.   He continues to take Neudexta and thinks it is still helping it, maybe less than when he first started taking it. He thinks his speech is about the same as prior. He denies difficulty swallowing. He denies weight loss or fevers.    He has some back pain from a recent fall, but otherwise has no pain.  03/06/23: After patient's last visit, I  recommended EMG.  This was completed on 12/03/22. It shows residuals of an old left L3 and left C8 radiculopathy, but no active changes or widespread denervation/reinnervation to suggest an anterior horn cell disorder such as ALS. I did patient's skin biopsy on 12/18/22. This was negative for phosphorylated alpha synuclein or amyloid. Nerve fiber density in lower limb was noted to be low (as with small fiber neuropathy). Patient stopped Sinemet  as he thinks it wasn't helping. Per his report on 01/09/23, he may have improved his freezing after. His symptoms were stable at that time. Patient wanted to pursue all available work up, so [facility] paraneoplastic serum panel and LP was done (LP on 01/30/23). Paraneoplastic panel was negative (see results below). CSF studies were normal (see results below).   Patient continues to fall at least once a week. He is now using a walker. If he has to walk to much, he does use a wheelchair. He continues to feel like he  can't control his feet. Patient is convinced he has a growth on his spine.   He denies any twitching muscles. He denies dysphagia. He denies tremors. He denies weight loss. He has urinary urgency, but no changes in bowel or bladder. He continues to freeze when he walks, especially if there is a change.   He continues to do well on Nuedexta  with less PBA.  Most recent Assessment and Plan (03/06/23): [name], a 83 y.o. 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 Symptoms started around 02/2022 per patient. Patient's symptoms are concerning for a neurodegenerative condition. He has responded well to Neudexta for his pseudobulbar affect. I was initially concerned for parkinsonism, perhaps CBD. A trial of Sinemet  did not appear to have any affect. DaTSCAN  was normal. 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 This would not explain symptoms though. [facility] movement disorders paraneoplastic panel was negative. Lumbar puncture was normal (routine analysis, IgG index, OCB, HSV, VZV, cytology, culture). MRI neuroaxis also did not show pathology to explain symptoms.   The etiology of patient's symptoms remains unclear despite the extensive work up. 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 His orthostatic vitals are negative, making MSA unlikely. CBD is possible. Patient does not have weakness that would be consistent with other neurogenerative condition such as ALS, but PLS could be another consideration.    Plan: -Referral to [facility] neurology (movement disorders) for second opinion -May consider repeating EMG at 6 months (~06/2022) -Continue Nuedexta  20-10 mg BID  -Fall precautions discussed -Will discuss with radiology at patient's request - Is there a growth at the rib that has been removed?  Since their last visit: I spoke with radiology as patient requested and there was residual fractures that may cause pain but no growths or anything pressing on the spine.  Patient was seen at [facility] on 04/17/23 by Dr. Jeneane Miracle. She felt history and exam were consistent with corticobasal syndrome but that typically the DaTscan  would be abnormal. She was going to review the images. She also recommended patient trial a higher dose of Sinemet , up to 2 tabs TID. This is not helping at all.  Patient is no longer walking. Even standing is difficult. He has been in a wheel chair for about 2 months.   He denies losing weight. He denies any difficulty swallowing. His speech is more slurred. He thinks his arms and legs are much weaker. He is not having twitching of his muscles. He denies difficulty breathing.  MEDICATIONS:  Outpatient Encounter Medications as of 09/04/2023  Medication Sig   ALPRAZolam  (XANAX ) 1 MG tablet Take 1 tablet (1 mg  total) by mouth daily as needed for severe anxiety.   carbidopa -levodopa  (SINEMET  IR) 25-100 MG tablet Take up to 2 tablets by mouth three times daily  Per schedule given   Dextromethorphan -quiNIDine (NUEDEXTA ) 20-10 MG capsule Take 1 capsule by mouth 2 (two) times daily.   meloxicam  (MOBIC ) 15 MG tablet Take 1 tablet (15 mg total) by mouth daily  for achy legs   oxyCODONE  (OXY IR/ROXICODONE ) 5 MG immediate release tablet Take 1 tablet (5 mg total) by mouth 3 (three) times daily as needed for pain.   [DISCONTINUED] oxyCODONE  (OXY IR/ROXICODONE ) 5 MG immediate release tablet Take 1 tablet (5 mg total) by mouth 3 (three) times daily as needed for  pain.   No facility-administered encounter medications on file as of 09/04/2023.    PAST MEDICAL HISTORY: Past Medical History:  Diagnosis Date   Arthritis    joints/  back/  neck   Chronic headaches    due to cervical spondylosis   Chronic renal insufficiency, stage 2 (mild)    borderlines II/III   Coronary artery disease 1999   cardiologist--- dr Lavonne Prairie---  (per cardiology note pt refused to take asa/ statin) hx PCI w/ stenting in 1999 to LAD and 12-23-2007 x2 DES to proximal LAD;  Cath 03/2017-->  non- critical 70 - 80% mid/distal circ stenosis,  chronic small D1 80% stenosis (stable) and DES to proximal and mid LAD--   Med mgmt rec'd-->nitrates caused HAs, ACE-I caused cough.   Corticobasal syndrome (HCC)    [facility] 04/2023   DDD (degenerative disc disease)    lumbar and cervical neck   Diverticulosis of sigmoid colon 07/25/2011   Severe (endoscopy by Dr. Adan Holms)   Erectile dysfunction after radical prostatectomy    History of kidney stones    History of prostate cancer 12/2014   urologist-- dr Joie Narrow---   s/p Robot assisted radical prostatectomy 07/2015: lymph node involvement//   undetactable PSA since   HTN (hypertension)    ACE-I cough.  Fine on ARB.   Hx of blastomycosis 1978   s/p  right upper lobectomy   Hyperlipidemia     NASH (nonalcoholic steatohepatitis) 06/2011   normal LFTS  /  Hep B and C testing neg.  Mild fatty liver on CT 2020   Nocturia    Parkinsonism (HCC)    2023/24, unknown etiology (Dat scan neg), peripheral nerve bx, and spinal fluid testing all NEG/Norma. [facility] 2nd opinion-->corticobasal syndrome 04/2023)   Pseudobulbar affect    Pulmonary nodules    small, picked up on CT ab/pelv for abd pains/flank pains-->pt quit smoking 40 yrs ago.  Low risk for lung ca so no f/u CT needed.   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   Tinnitus    Wears glasses    Wears hearing aid in both ears     PAST SURGICAL HISTORY: Past Surgical History:  Procedure Laterality Date   ANTERIOR CERVICAL DECOMP/DISCECTOMY FUSION  1982   C6--7   APPENDECTOMY  1953   CARDIAC CATHETERIZATION  02/27/2000   @[facility]  by dr hochrein ---  single vessel w/ preserved LVSF   CORONARY ANGIOPLASTY WITH STENT PLACEMENT  12/23/2007   by Dr End--- PCI w/ DES x2 to  pLAD   CORONARY STENT PLACEMENT  1999   PTCA w/ stenting to LAd   CYSTOSCOPY WITH RETROGRADE PYELOGRAM, URETEROSCOPY AND STENT PLACEMENT Left 05/10/2018   Procedure: CYSTOSCOPY WITH LEFT RETROGRADE PYELOGRAM, LEFT URETEROSCOPY AND LEFT URETERAL STENT PLACEMENT;  Surgeon: Osborn Blaze, MD;  Location: [facility];  Service: Urology;  Laterality: Left;   HOLMIUM LASER APPLICATION Left 05/10/2018   Procedure: HOLMIUM LASER APPLICATION;  Surgeon: Osborn Blaze, MD;  Location: [facility];  Service: Urology;  Laterality: Left;   INCISIONAL HERNIA REPAIR N/A 03/19/2019   Procedure: LAPAROSCOPIC INCISIONAL HERNIA REPAIR WITH MESH, RECURRENT UMBILICAL HERNIA REPAIR;  Surgeon: Candyce Champagne, MD;  Location: [facility];  Service: General;  Laterality: N/A;   INGUINAL HERNIA REPAIR Right 03/19/2019   Procedure:  LAPAROSCOPIC BILATERAL FEMORAL AND INGUINAL HERNIA REPAIR WITH MESH, LYSIS OF ADHESIONS;  Surgeon: Candyce Champagne, MD;  Location: [facility];  Service: General;  Laterality: Right;   LEFT HEART CATH AND CORONARY ANGIOGRAPHY N/A 03/21/2017   Stents patent; mod circ dz, nothing for intervention--imdur  added.  Procedure: LEFT HEART CATH AND CORONARY ANGIOGRAPHY;  Surgeon: Sammy Crisp, MD;  Location: [facility];  Service: Cardiovascular;  Laterality: N/A;   LUMBAR SPINE SURGERY  1978   L5--S1   LUNG LOBECTOMY Right 1978   right upper lobectomy for blastomycosis   LYMPHADENECTOMY Bilateral 07/14/2015   Procedure: LYMPHADENECTOMY;  Surgeon: Florencio Hunting, MD;  Location: [facility];  Service: Urology;  Laterality: Bilateral;   ROBOT ASSISTED LAPAROSCOPIC RADICAL PROSTATECTOMY N/A 07/14/2015   One positive pelvic LN.  Procedure: XI ROBOTIC ASSISTED LAPAROSCOPIC RADICAL PROSTATECTOMY LEVEL 2;  Surgeon: Florencio Hunting, MD;  Location: [facility];  Service: Urology;  Laterality: N/A;   ROTATOR CUFF REPAIR  2007   UMBILICAL HERNIA REPAIR  07/14/2015   Procedure: HERNIA REPAIR UMBILICAL ADULT;  Surgeon: Florencio Hunting, MD;  Location: [facility];  Service: Urology;;    ALLERGIES: Allergies  Allergen Reactions   Ace Inhibitors Cough   Imdur  [Isosorbide  Dinitrate] Other (See Comments)    Headache    FAMILY HISTORY: Family History  Problem Relation Age of Onset   Cervical cancer Mother        deceased   Cirrhosis Father    Colon cancer Neg Hx     SOCIAL HISTORY: Social History   Tobacco Use   Smoking status: Former    Current packs/day: 0.00    Average packs/day: 0.5 packs/day for 20.0 years (10.0 ttl pk-yrs)    Types: Cigarettes    Start date: 52    Quit date: 56    Years since quitting: 47.3   Smokeless tobacco: Former    Types: Chew    Quit date: 2000   Tobacco comments:    Scoal  Vaping Use   Vaping status: Never Used  Substance Use Topics   Alcohol use: Yes    Comment: seldom    Drug use: Never   Social History   Social History Narrative   Married, 2 adult children ([place] and [place]).   Lives in [place].     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   Tob (smoke and chew) x 20 yrs, quit approx 1980s.   Alcohol: very rare.   One cup coffee each morning.   Exercise: walks 3 miles about 4 times per week.   Active lifestyle   Two story home   Right handed      Objective:  Vital Signs:  BP (!) 155/81   Pulse 64   Wt 163 lb (73.9 kg)   SpO2 98%   BMI 24.07 kg/m   General: General appearance: Awake and alert. No distress. Cooperative with exam.  Skin: No obvious rash or jaundice. HEENT: Atraumatic. Anicteric. Lungs: Non-labored breathing on room air  Extremities: No edema. No obvious deformity.  Psych: Affect appropriate.  Neurological: Mental Status: Alert. Speech fluent. No pseudobulbar affect Cranial Nerves: CNII: No RAPD. Visual fields intact. CNIII, IV, VI: PERRL. No nystagmus. Mildly restricted upgaze, otherwise intact EOM. Choppy pursuit. ?Square wave jerks CN V: Facial sensation intact bilaterally to fine touch.  CN VII: Facial muscles symmetric and strong. No ptosis at rest CN VIII: Hears finger rub well bilaterally. CN IX: No hypophonia. CN X: Palate elevates symmetrically. CN  XI: Full strength shoulder shrug bilaterally. CN XII: Tongue protrusion full and midline. No atrophy or fasciculations. Dysarthric. Difficulty to understand at times. Worse than previous. Motor: Tone is increased in bilateral lower extremities. Toe tapping and finger tapping abnormal. Few fasciculations in RLE. No obvious atrophy.  Individual muscle group testing (MRC grade out of 5):  Movement     Neck flexion 5    Neck extension 5     Right Left   Shoulder abduction 5 5   Elbow flexion 5 5   Elbow extension 5 5   Finger abduction - FDI 5 5   Finger abduction - ADM 5 5   Finger extension 5 5   Finger distal flexion - 2/3 5 5    Finger  distal flexion - 4/5 5 5    Thumb flexion - FPL 5 5   Thumb abduction - APB 5 5    Hip flexion 5- 5   Hip extension 5 5   Hip adduction 5 5   Hip abduction 5 5   Knee extension 5 5   Knee flexion 5 5   Dorsiflexion 5 5   Plantarflexion 5 5    Reflexes:  Right Left   Bicep 3+ 3+   Tricep 3+ 3+   BrRad 3+ 3+   Knee 3+ 3+   Ankle 2+ 2+    Sensation: Intact to light touch in all extremities Coordination: Intact finger-to- nose-finger bilaterally. Gait: Unable to ambulate.  Labs and Imaging review: No new results  Previously reviewed results: [facility] movement disorders paraneoplastic panel: negative   Skin biopsy (12/18/22):    CSF (01/30/23): Routine analysis: 3 RBC, 1 WBC, 50 protein, 58 glucose (all wnl) Cytology negative CSF culture (bacterial and fungal): negative HSV negative VZV not detected Oligoclonal bands absent IgG index: normal at 0.46   09/04/22: HIV non-reactive Multiple myeloma panel pending ANA negative CRP: 9.4 ESR: 15 HbA1c: 5.2 Vit E, copper , zinc  wnl   09/03/22: Lipid panel unremarkable Aldolase wnl (3.6) CK: 83 TSH: 3.373 UA unremarkable Urine drug screen positive for tetrahydrocannabinol EtOH: negative Ammonia < 10 CMP unremarkable CBC significant for MCV of 102, platelets 120 folate wnl, AChR ab negative   08/23/22: B12: 648 CMP: Cr 1.20, GFR 57.02 TSH: 2.67 CBC: MCV 104.2   MRI brain wo contrast (09/03/22): FINDINGS: Brain: No acute infarct, mass effect or extra-axial collection. No acute or chronic hemorrhage. There is multifocal hyperintense T2-weighted signal within the white matter. Generalized volume loss. The midline structures are normal.   Vascular: Major flow voids are preserved.   Skull and upper cervical spine: Normal calvarium and skull base. Visualized upper cervical spine and soft tissues are normal.   Sinuses/Orbits:No paranasal sinus fluid levels or advanced mucosal thickening. No mastoid or middle  ear effusion. Normal orbits.   IMPRESSION: 1. No acute intracranial abnormality. 2. 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   CT head wo contrast (09/03/22): FINDINGS: Brain: There is no acute intracranial hemorrhage, extra-axial fluid collection, or acute infarct   There is background parenchymal volume loss with prominence of the ventricular system and extra-axial CSF spaces. The ventricles are slightly increased in size compared to the head CT from 2023 but without evidence of transependymal flow of CSF to suggest acute hydrocephalus. Gray-white differentiation is preserved. Mild hypodensity in the supratentorial white matter likely reflects sequela of mild chronic small-vessel ischemic change.   The pituitary and suprasellar region are normal. There is no mass lesion there is  no mass effect or midline shift.   Vascular: No hyperdense vessel or unexpected calcification.   Skull: Normal. Negative for fracture or focal lesion.   Sinuses/Orbits: The imaged paranasal sinuses are clear. The imaged globes and orbits are unremarkable.   Other: None.   IMPRESSION: No acute intracranial pathology.   MRI cervical, thoracic, and lumbar spine wo contrast (08/25/22): IMPRESSION: CERVICAL SPINE:   1. C5-C6 mild spinal canal stenosis and moderate to severe bilateral neural foraminal narrowing. 2. C7-T1 mild spinal canal stenosis and mild-to-moderate bilateral neural foraminal narrowing. 3. C3-C4 severe right and moderate left neural foraminal narrowing. 4. C4-C5 severe bilateral neural foraminal narrowing.   THORACIC SPINE:   T1-T2 mild spinal canal stenosis, with moderate left and mild right neural foraminal narrowing.   LUMBAR SPINE:   1. L5-S1 moderate right and mild left neural foraminal narrowing. 2. L3-L4 and L4-L5 mild right neural foraminal narrowing. At L3-L4 and L4-L5, right extreme lateral disc protrusion may contact the exiting right L3 and L4  nerves, as well as narrowing of the right lateral recesses, which could affect the descending right L4 and L5 nerve roots, respectively. 3. No spinal canal stenosis. 4. Multilevel facet arthropathy, which can be a cause of back pain. 5. 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   DaTSCAN  (10/24/22): FINDINGS: Symmetric intense uptake within LEFT and RIGHT striata. The heads of the caudate nuclei and the posterior striata (putamen) are normal shape. No evidence of loss of dopamine transport populations in the basal ganglia.   IMPRESSION: Ioflupane scan within normal limits. No reduced radiotracer activity in basal ganglia to suggest Parkinson's syndrome pathology.  EMG (12/03/22): NCV & EMG Findings: Extensive electrodiagnostic evaluation of the left upper and lower limbs with thoracic paraspinal muscles shows: Left superficial peroneal/fibular sensory response shows reduced amplitude (2 V). Left sural, median, ulnar, and radial sensory responses are within normal limits. Left peroneal/fibular (EDB) motor response shows reduced amplitude (1.37 mV). Left tibial (AH), median (APB), and ulnar (ADM) motor responses are within normal limits. Left H reflex latency is within normal limits. Chronic motor axon loss changes without active denervation changes are seen in the left rectus femoris, adductor longus, first dorsal interosseous, and extensor indicis proprius muscles.   Impression: This is an abnormal study. 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 No electrodiagnostic evidence of a disorder of anterior horn cells (ie: motor neuron disease) at this  time. No electrodiagnostic evidence of a large fiber sensorimotor neuropathy. The low amplitude left superficial peroneal/fibular sensory response may be due to age or technical factors. The low amplitude peroneal/fibular (EDB) motor response may also be age related or technical. There is no evidence of a peroneal/fibular neuropathy on needle examination.   CT head and cervical spine (12/14/22): IMPRESSION: 1. No acute intracranial abnormality. Small left temporal scalp contusion. 2. No acute cervical spine fracture or traumatic malalignment.  Assessment/Plan:  [name], a 83 y.o. male originally seen for pseudobulbar affect, dysarthria, shuffling gait, freezing, imbalance, and falls. He is now wheelchair bound and not able to safely stand or ambulate. Symptoms started around 02/2022 per patient.   This is a complex case without  a clear diagnosis to me. His symptoms are certainly concerning for a neurodegenerative condition. 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 PLS remains possible, but a hard diagnosis to make currently.  I also considered parkinsonism, particularly CBD. A trial of Sinemet  did not appear to have any affect. DaTSCAN  was normal. 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 This would not explain symptoms though. [facility] movement [facility] paraneoplastic panel was negative. Lumbar puncture was normal (routine analysis, IgG index, OCB, HSV, VZV, cytology, culture). MRI neuroaxis also did not show pathology to explain symptoms. He has been seen by Dr. Jeneane Miracle in movement disorders at [facility], who per notes, also feels symptoms may be due to CBD. The normal DaTscan  is odd though. He is now on Sinemet  2 tablets  TID and still getting worse.  Despite feeling that he is significantly weaker, his individual muscle strength continues to be strong. I believe this to be a coordination issue more than muscle strength issue and still believe symptoms to be CNS (or upper motor neuron) in origin. I discussed this and the diagnostic challenges with the patient and his wife. I explained that I do not see a treatable cause of his symptoms. I offered another EMG to again look for a peripheral process such as ALS, but I am not convinced there is significant evidence for this currently to expect a different result on EMG. Patient declined as he knows there is no cure or great treatments for ALS and would prefer to avoid the repeat EMG for now.  Plan: -Continue Sinemet  for now until he follows up with Dr. Jeneane Miracle next month -Could consider repeat EMG if symptoms appear to be developing into ALS or if patient would like -Will continue supportive care for now -Fall precautions discussed -Patient to call if swallowing or breathing become issues  Return to clinic in 3 months  Total time spent reviewing records, interview, history/exam, documentation, and coordination of care on day of encounter:  45 min  Rommie Coats, MD

## 2023-08-28 ENCOUNTER — Telehealth: Payer: Self-pay

## 2023-08-28 DIAGNOSIS — Z0279 Encounter for issue of other medical certificate: Secondary | ICD-10-CM

## 2023-08-28 NOTE — Telephone Encounter (Signed)
Placed on PCP desk to review and sign, if appropriate.  

## 2023-08-28 NOTE — Telephone Encounter (Signed)
 Patient's wife dropped off document FMLA, to be filled out by provider. Patient requested to send it back via Fax within 7-days. Document is located in providers tray at front office.

## 2023-08-31 ENCOUNTER — Other Ambulatory Visit: Payer: Self-pay | Admitting: Family Medicine

## 2023-09-02 ENCOUNTER — Other Ambulatory Visit (HOSPITAL_BASED_OUTPATIENT_CLINIC_OR_DEPARTMENT_OTHER): Payer: Self-pay

## 2023-09-02 ENCOUNTER — Encounter: Payer: Self-pay | Admitting: Family Medicine

## 2023-09-02 ENCOUNTER — Other Ambulatory Visit: Payer: Self-pay

## 2023-09-03 ENCOUNTER — Other Ambulatory Visit (HOSPITAL_BASED_OUTPATIENT_CLINIC_OR_DEPARTMENT_OTHER): Payer: Self-pay

## 2023-09-03 ENCOUNTER — Other Ambulatory Visit (HOSPITAL_COMMUNITY): Payer: Self-pay

## 2023-09-03 ENCOUNTER — Telehealth: Payer: Self-pay | Admitting: Pharmacy Technician

## 2023-09-03 MED ORDER — OXYCODONE HCL 5 MG PO TABS
5.0000 mg | ORAL_TABLET | Freq: Three times a day (TID) | ORAL | 0 refills | Status: DC | PRN
  Filled 2023-09-03: qty 30, 10d supply, fill #0

## 2023-09-03 NOTE — Telephone Encounter (Signed)
 Requesting: oxycodone  Contract: N/A UDS: N/A Last Visit: 07/18/23 Next Visit: 11/06/23 Last Refill: 08/05/23 (30,0)  Please Advise. Med pending

## 2023-09-03 NOTE — Telephone Encounter (Signed)
 Pharmacy Patient Advocate Encounter   Received notification from CoverMyMeds that prior authorization for NUEDEXTA  is required/requested.   Insurance verification completed.   The patient is insured through [facility] .   Per test claim: PA required; PA submitted to above mentioned insurance via CoverMyMeds Key/confirmation #/EOC  [id number] Status is pending

## 2023-09-03 NOTE — Telephone Encounter (Signed)
 Pharmacy Patient Advocate Encounter  Received notification from [employer] that Prior Authorization for NUEDEXTA  has been APPROVED from 4.29.25 to 4.29.26. Unable to obtain price due to refill too soon rejection, last fill date 4.28.25 next available fill date5.22.25   PA #/Case ID/Reference #: [id number]

## 2023-09-04 ENCOUNTER — Encounter: Payer: Self-pay | Admitting: Neurology

## 2023-09-04 ENCOUNTER — Ambulatory Visit (INDEPENDENT_AMBULATORY_CARE_PROVIDER_SITE_OTHER): Payer: Commercial Managed Care - PPO | Admitting: Neurology

## 2023-09-04 VITALS — BP 155/81 | HR 64 | Wt 163.0 lb

## 2023-09-04 DIAGNOSIS — R258 Other abnormal involuntary movements: Secondary | ICD-10-CM | POA: Diagnosis not present

## 2023-09-04 DIAGNOSIS — R251 Tremor, unspecified: Secondary | ICD-10-CM

## 2023-09-04 DIAGNOSIS — R2689 Other abnormalities of gait and mobility: Secondary | ICD-10-CM

## 2023-09-04 DIAGNOSIS — F482 Pseudobulbar affect: Secondary | ICD-10-CM | POA: Diagnosis not present

## 2023-09-04 DIAGNOSIS — R269 Unspecified abnormalities of gait and mobility: Secondary | ICD-10-CM

## 2023-09-04 DIAGNOSIS — R4789 Other speech disturbances: Secondary | ICD-10-CM

## 2023-09-04 DIAGNOSIS — R296 Repeated falls: Secondary | ICD-10-CM | POA: Diagnosis not present

## 2023-09-04 NOTE — Patient Instructions (Signed)
 Continue Sinemet  until you follow up with [facility].  I will continue to monitor but there is no clear change to suggest ALS at this time. I am still more concerned for CBD.  I will see you back in 3 months.  The physicians and staff at [facility] are committed to providing excellent care. You may receive a survey requesting feedback about your experience at our office. We strive to receive "very good" responses to the survey questions. If you feel that your experience would prevent you from giving the office a "very good " response, please contact our office to try to remedy the situation. We may be reached at [phone]. Thank you for taking the time out of your busy day to complete the survey.  Rommie Coats, MD [facility]  Preventing Falls at [facility] are common, often dreaded events in the lives of older people. Aside from the obvious injuries and even death that may result, fall can cause wide-ranging consequences including loss of independence, mental decline, decreased activity and mobility. Younger people are also at risk of falling, especially those with chronic illnesses and fatigue.  Ways to reduce risk for falling Examine diet and medications. Warm foods and alcohol dilate blood vessels, which can lead to dizziness when standing. Sleep aids, antidepressants and pain medications can also increase the likelihood of a fall.  Get a vision exam. Poor vision, cataracts and glaucoma increase the chances of falling.  Check foot gear. Shoes should fit snugly and have a sturdy, nonskid sole and a broad, low heel  Participate in a physician-approved exercise program to build and maintain muscle strength and improve balance and coordination. Programs that use ankle weights or stretch bands are excellent for muscle-strengthening. Water  aerobics programs and low-impact Tai Chi programs have also been shown to improve balance and coordination.  Increase vitamin D  intake.  Vitamin D  improves muscle strength and increases the amount of calcium  the body is able to absorb and deposit in bones.  How to prevent falls from common hazards Floors - Remove all loose wires, cords, and throw rugs. Minimize clutter. Make sure rugs are anchored and smooth. Keep furniture in its usual place.  Chairs -- Use chairs with straight backs, armrests and firm seats. Add firm cushions to existing pieces to add height.  Bathroom - Install grab bars and non-skid tape in the tub or shower. Use a bathtub transfer bench or a shower chair with a back support Use an elevated toilet seat and/or safety rails to assist standing from a low surface. Do not use towel racks or bathroom tissue holders to help you stand.  Lighting - Make sure halls, stairways, and entrances are well-lit. Install a night light in your bathroom or hallway. Make sure there is a light switch at the top and bottom of the staircase. Turn lights on if you get up in the middle of the night. Make sure lamps or light switches are within reach of the bed if you have to get up during the night.  Kitchen - Install non-skid rubber mats near the sink and stove. Clean spills immediately. Store frequently used utensils, pots, pans between waist and eye level. This helps prevent reaching and bending. Sit when getting things out of lower cupboards.  Living room/ Bedrooms - Place furniture with wide spaces in between, giving enough room to move around. Establish a route through the living room that gives you something to hold onto as you walk.  Stairs - Make sure  treads, rails, and rugs are secure. Install a rail on both sides of the stairs. If stairs are a threat, it might be helpful to arrange most of your activities on the lower level to reduce the number of times you must climb the stairs.  Entrances and doorways - Install metal handles on the walls adjacent to the doorknobs of all doors to make it more secure as you travel through the  doorway.  Tips for maintaining balance Keep at least one hand free at all times. Try using a backpack or fanny pack to hold things rather than carrying them in your hands. Never carry objects in both hands when walking as this interferes with keeping your balance.  Attempt to swing both arms from front to back while walking. This might require a conscious effort if Parkinson's disease has diminished your movement. It will, however, help you to maintain balance and posture, and reduce fatigue.  Consciously lift your feet off of the ground when walking. Shuffling and dragging of the feet is a common culprit in losing your balance.  When trying to navigate turns, use a "U" technique of facing forward and making a wide turn, rather than pivoting sharply.  Try to stand with your feet shoulder-length apart. When your feet are close together for any length of time, you increase your risk of losing your balance and falling.  Do one thing at a time. Don't try to walk and accomplish another task, such as reading or looking around. The decrease in your automatic reflexes complicates motor function, so the less distraction, the better.  Do not wear rubber or gripping soled shoes, they might "catch" on the floor and cause tripping.  Move slowly when changing positions. Use deliberate, concentrated movements and, if needed, use a grab bar or walking aid. Count 15 seconds between each movement. For example, when rising from a seated position, wait 15 seconds after standing to begin walking.  If balance is a continuous problem, you might want to consider a walking aid such as a cane, walking stick, or walker. Once you've mastered walking with help, you might be ready to try it on your own again.

## 2023-09-18 ENCOUNTER — Ambulatory Visit (INDEPENDENT_AMBULATORY_CARE_PROVIDER_SITE_OTHER): Payer: Commercial Managed Care - PPO | Admitting: *Deleted

## 2023-09-18 DIAGNOSIS — Z0000 Encounter for general adult medical examination without abnormal findings: Secondary | ICD-10-CM | POA: Diagnosis not present

## 2023-09-18 DIAGNOSIS — G3185 Corticobasal degeneration: Secondary | ICD-10-CM | POA: Diagnosis not present

## 2023-09-18 NOTE — Patient Instructions (Signed)
 William Montgomery , Thank you for taking time to come for your Medicare Wellness Visit. I appreciate your ongoing commitment to your health goals. Please review the following plan we discussed and let me know if I can assist you in the future.   Screening recommendations/referrals: Colonoscopy: no longer required Recommended yearly ophthalmology/optometry visit for glaucoma screening and checkup Recommended yearly dental visit for hygiene and checkup  Vaccinations: Influenza vaccine: up to date Pneumococcal vaccine: up to date Tdap vaccine: up to date Shingles vaccine: up to date     Preventive Care 65 Years and Older, Male Preventive care refers to lifestyle choices and visits with your health care provider that can promote health and wellness. What does preventive care include? A yearly physical exam. This is also called an annual well check. Dental exams once or twice a year. Routine eye exams. Ask your health care provider how often you should have your eyes checked. Personal lifestyle choices, including: Daily care of your teeth and gums. Regular physical activity. Eating a healthy diet. Avoiding tobacco and drug use. Limiting alcohol use. Practicing safe sex. Taking low doses of aspirin every day. Taking vitamin and mineral supplements as recommended by your health care provider. What happens during an annual well check? The services and screenings done by your health care provider during your annual well check will depend on your age, overall health, lifestyle risk factors, and family history of disease. Counseling  Your health care provider may ask you questions about your: Alcohol use. Tobacco use. Drug use. Emotional well-being. Home and relationship well-being. Sexual activity. Eating habits. History of falls. Memory and ability to understand (cognition). Work and work Astronomer. Screening  You may have the following tests or measurements: Height, weight, and  BMI. Blood pressure. Lipid and cholesterol levels. These may be checked every 5 years, or more frequently if you are over 59 years old. Skin check. Lung cancer screening. You may have this screening every year starting at age 22 if you have a 30-pack-year history of smoking and currently smoke or have quit within the past 15 years. Fecal occult blood test (FOBT) of the stool. You may have this test every year starting at age 16. Flexible sigmoidoscopy or colonoscopy. You may have a sigmoidoscopy every 5 years or a colonoscopy every 10 years starting at age 8. Prostate cancer screening. Recommendations will vary depending on your family history and other risks. Hepatitis C blood test. Hepatitis B blood test. Sexually transmitted disease (STD) testing. Diabetes screening. This is done by checking your blood sugar (glucose) after you have not eaten for a while (fasting). You may have this done every 1-3 years. Abdominal aortic aneurysm (AAA) screening. You may need this if you are a current or former smoker. Osteoporosis. You may be screened starting at age 43 if you are at high risk. Talk with your health care provider about your test results, treatment options, and if necessary, the need for more tests. Vaccines  Your health care provider may recommend certain vaccines, such as: Influenza vaccine. This is recommended every year. Tetanus, diphtheria, and acellular pertussis (Tdap, Td) vaccine. You may need a Td booster every 10 years. Zoster vaccine. You may need this after age 109. Pneumococcal 13-valent conjugate (PCV13) vaccine. One dose is recommended after age 31. Pneumococcal polysaccharide (PPSV23) vaccine. One dose is recommended after age 33. Talk to your health care provider about which screenings and vaccines you need and how often you need them. This information is not intended to  replace advice given to you by your health care provider. Make sure you discuss any questions you have  with your health care provider. Document Released: 05/20/2015 Document Revised: 01/11/2016 Document Reviewed: 02/22/2015 Elsevier Interactive Patient Education  2017 [employer].  Fall Prevention in the Home Falls can cause injuries. They can happen to people of all ages. There are many things you can do to make your home safe and to help prevent falls. What can I do on the outside of my home? Regularly fix the edges of walkways and driveways and fix any cracks. Remove anything that might make you trip as you walk through a door, such as a raised step or threshold. Trim any bushes or trees on the path to your home. Use bright outdoor lighting. Clear any walking paths of anything that might make someone trip, such as rocks or tools. Regularly check to see if handrails are loose or broken. Make sure that both sides of any steps have handrails. Any raised decks and porches should have guardrails on the edges. Have any leaves, snow, or ice cleared regularly. Use sand or salt on walking paths during winter. Clean up any spills in your garage right away. This includes oil or grease spills. What can I do in the bathroom? Use night lights. Install grab bars by the toilet and in the tub and shower. Do not use towel bars as grab bars. Use non-skid mats or decals in the tub or shower. If you need to sit down in the shower, use a plastic, non-slip stool. Keep the floor dry. Clean up any water that spills on the floor as soon as it happens. Remove soap buildup in the tub or shower regularly. Attach bath mats securely with double-sided non-slip rug tape. Do not have throw rugs and other things on the floor that can make you trip. What can I do in the bedroom? Use night lights. Make sure that you have a light by your bed that is easy to reach. Do not use any sheets or blankets that are too big for your bed. They should not hang down onto the floor. Have a firm chair that has side arms. You can use  this for support while you get dressed. Do not have throw rugs and other things on the floor that can make you trip. What can I do in the kitchen? Clean up any spills right away. Avoid walking on wet floors. Keep items that you use a lot in easy-to-reach places. If you need to reach something above you, use a strong step stool that has a grab bar. Keep electrical cords out of the way. Do not use floor polish or wax that makes floors slippery. If you must use wax, use non-skid floor wax. Do not have throw rugs and other things on the floor that can make you trip. What can I do with my stairs? Do not leave any items on the stairs. Make sure that there are handrails on both sides of the stairs and use them. Fix handrails that are broken or loose. Make sure that handrails are as long as the stairways. Check any carpeting to make sure that it is firmly attached to the stairs. Fix any carpet that is loose or worn. Avoid having throw rugs at the top or bottom of the stairs. If you do have throw rugs, attach them to the floor with carpet tape. Make sure that you have a light switch at the top of the stairs and the  bottom of the stairs. If you do not have them, ask someone to add them for you. What else can I do to help prevent falls? Wear shoes that: Do not have high heels. Have rubber bottoms. Are comfortable and fit you well. Are closed at the toe. Do not wear sandals. If you use a stepladder: Make sure that it is fully opened. Do not climb a closed stepladder. Make sure that both sides of the stepladder are locked into place. Ask someone to hold it for you, if possible. Clearly mark and make sure that you can see: Any grab bars or handrails. First and last steps. Where the edge of each step is. Use tools that help you move around (mobility aids) if they are needed. These include: Canes. Walkers. Scooters. Crutches. Turn on the lights when you go into a dark area. Replace any light bulbs  as soon as they burn out. Set up your furniture so you have a clear path. Avoid moving your furniture around. If any of your floors are uneven, fix them. If there are any pets around you, be aware of where they are. Review your medicines with your doctor. Some medicines can make you feel dizzy. This can increase your chance of falling. Ask your doctor what other things that you can do to help prevent falls. This information is not intended to replace advice given to you by your health care provider. Make sure you discuss any questions you have with your health care provider. Document Released: 02/17/2009 Document Revised: 09/29/2015 Document Reviewed: 05/28/2014 Elsevier Interactive Patient Education  2017 [employer].

## 2023-09-18 NOTE — Progress Notes (Signed)
 Subjective:   William Montgomery is a 83 y.o. male who presents for Medicare Annual/Subsequent preventive examination.  Visit Complete: Virtual I connected with  William Montgomery on 09/18/23 by a audio enabled telemedicine application and verified that I am speaking with the correct person using two identifiers.  Patient Location: Home  Provider Location: Home Office  I discussed the limitations of evaluation and management by telemedicine. The patient expressed understanding and agreed to proceed.  Vital Signs: Because this visit was a virtual/telehealth visit, some criteria may be missing or patient reported. Any vitals not documented were not able to be obtained and vitals that have been documented are patient reported.  Patient Medicare AWV questionnaire was completed by the patient on 09-15-2023; I have confirmed that all information answered by patient is correct and no changes since this date.  Cardiac Risk Factors include: advanced age (>34men, >61 women);male gender;sedentary lifestyle     Objective:     There were no vitals filed for this visit. There is no height or weight on file to calculate BMI.     09/18/2023    8:26 AM 09/04/2023    2:17 PM 05/29/2023    1:17 PM 03/06/2023    1:55 PM 11/29/2022    9:46 AM 09/11/2022    1:44 PM 09/05/2022   10:55 AM  Advanced Directives  Does Patient Have a Medical Advance Directive? Yes Yes Yes Yes Yes Yes Yes  Type of Advance Directive Healthcare Power of Attorney   Living will;Healthcare Power of [employer] Power of [place];Living will Healthcare Power of [place];Living will Healthcare Power of [place];Living will  Does patient want to make changes to medical advance directive?   No - Patient declined   No - Patient declined   Copy of Healthcare Power of Attorney in Chart? Yes - validated most recent copy scanned in chart (See row information)     Yes - validated most recent copy scanned in chart (See row information)      Current Medications (verified) Outpatient Encounter Medications as of 09/18/2023  Medication Sig   ALPRAZolam  (XANAX ) 1 MG tablet Take 1 tablet (1 mg total) by mouth daily as needed for severe anxiety.   carbidopa -levodopa  (SINEMET  IR) 25-100 MG tablet Take up to 2 tablets by mouth three times daily  Per schedule given   Dextromethorphan -quiNIDine (NUEDEXTA ) 20-10 MG capsule Take 1 capsule by mouth 2 (two) times daily.   meloxicam  (MOBIC ) 15 MG tablet Take 1 tablet (15 mg total) by mouth daily  for achy legs   oxyCODONE  (OXY IR/ROXICODONE ) 5 MG immediate release tablet Take 1 tablet (5 mg total) by mouth 3 (three) times daily as needed for pain.   No facility-administered encounter medications on file as of 09/18/2023.    Allergies (verified) Ace inhibitors and Imdur  [isosorbide  dinitrate]   History: Past Medical History:  Diagnosis Date   Arthritis    joints/  back/  neck   Chronic headaches    due to cervical spondylosis   Chronic renal insufficiency, stage 2 (mild)    borderlines II/III   Coronary artery disease 1999   cardiologist--- dr Lavonne Prairie---  (per cardiology note pt refused to take asa/ statin) hx PCI w/ stenting in 1999 to LAD and 12-23-2007 x2 DES to proximal LAD;  Cath 03/2017-->  non- critical 70 - 80% mid/distal circ stenosis,  chronic small D1 80% stenosis (stable) and DES to proximal and mid LAD--   Med mgmt rec'd-->nitrates caused HAs, ACE-I caused cough.  Corticobasal syndrome (HCC)    [facility] 04/2023   DDD (degenerative disc disease)    lumbar and cervical neck   Diverticulosis of sigmoid colon 07/25/2011   Severe (endoscopy by Dr. Adan Holms)   Erectile dysfunction after radical prostatectomy    History of kidney stones    History of prostate cancer 12/2014   urologist-- dr Joie Narrow---   s/p Robot assisted radical prostatectomy 07/2015: lymph node involvement//   undetactable PSA since   HTN (hypertension)    ACE-I cough.  Fine on ARB.   Hx of  blastomycosis 1978   s/p  right upper lobectomy   Hyperlipidemia    NASH (nonalcoholic steatohepatitis) 06/2011   normal LFTS  /  Hep B and C testing neg.  Mild fatty liver on CT 2020   Nocturia    Parkinsonism (HCC)    2023/24, unknown etiology (Dat scan neg), peripheral nerve bx, and spinal fluid testing all NEG/Norma. [facility] 2nd opinion-->corticobasal syndrome 04/2023)   Pseudobulbar affect    Pulmonary nodules    small, picked up on CT ab/pelv for abd pains/flank pains-->pt quit smoking 40 yrs ago.  Low risk for lung ca so no f/u CT needed.   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   Tinnitus    Wears glasses    Wears hearing aid in both ears    Past Surgical History:  Procedure Laterality Date   ANTERIOR CERVICAL DECOMP/DISCECTOMY FUSION  1982   C6--7   APPENDECTOMY  1953   CARDIAC CATHETERIZATION  02/27/2000   @[facility]  by dr hochrein ---  single vessel w/ preserved LVSF   CORONARY ANGIOPLASTY WITH STENT PLACEMENT  12/23/2007   by Dr End--- PCI w/ DES x2 to  pLAD   CORONARY STENT PLACEMENT  1999   PTCA w/ stenting to LAd   CYSTOSCOPY WITH RETROGRADE PYELOGRAM, URETEROSCOPY AND STENT PLACEMENT Left 05/10/2018   Procedure: CYSTOSCOPY WITH LEFT RETROGRADE PYELOGRAM, LEFT URETEROSCOPY AND LEFT URETERAL STENT PLACEMENT;  Surgeon: Osborn Blaze, MD;  Location: [facility];  Service: Urology;  Laterality: Left;   HOLMIUM LASER APPLICATION Left 05/10/2018   Procedure: HOLMIUM LASER APPLICATION;  Surgeon: Osborn Blaze, MD;  Location: [facility];  Service: Urology;  Laterality: Left;   INCISIONAL HERNIA REPAIR N/A 03/19/2019   Procedure: LAPAROSCOPIC INCISIONAL HERNIA REPAIR WITH MESH, RECURRENT UMBILICAL HERNIA REPAIR;  Surgeon: Candyce Champagne, MD;  Location: [facility];  Service: General;  Laterality: N/A;   INGUINAL HERNIA  REPAIR Right 03/19/2019   Procedure: LAPAROSCOPIC BILATERAL FEMORAL AND INGUINAL HERNIA REPAIR WITH MESH, LYSIS OF ADHESIONS;  Surgeon: Candyce Champagne, MD;  Location: [facility];  Service: General;  Laterality: Right;   LEFT HEART CATH AND CORONARY ANGIOGRAPHY N/A 03/21/2017   Stents patent; mod circ dz, nothing for intervention--imdur  added.  Procedure: LEFT HEART CATH AND CORONARY ANGIOGRAPHY;  Surgeon: Sammy Crisp, MD;  Location: [facility];  Service: Cardiovascular;  Laterality: N/A;   LUMBAR SPINE SURGERY  1978   L5--S1   LUNG LOBECTOMY Right 1978   right upper lobectomy for blastomycosis   LYMPHADENECTOMY Bilateral 07/14/2015   Procedure: LYMPHADENECTOMY;  Surgeon: Florencio Hunting, MD;  Location: [facility];  Service: Urology;  Laterality: Bilateral;   ROBOT ASSISTED LAPAROSCOPIC RADICAL PROSTATECTOMY N/A 07/14/2015   One positive pelvic LN.  Procedure: XI ROBOTIC ASSISTED LAPAROSCOPIC RADICAL PROSTATECTOMY LEVEL 2;  Surgeon: Florencio Hunting, MD;  Location: [facility];  Service: Urology;  Laterality: N/A;   ROTATOR CUFF REPAIR  2007   UMBILICAL HERNIA REPAIR  07/14/2015   Procedure: HERNIA REPAIR UMBILICAL ADULT;  Surgeon: Florencio Hunting, MD;  Location: [facility];  Service: Urology;;   Family History  Problem Relation Age of Onset   Cervical cancer Mother        deceased   Cirrhosis Father    Colon cancer Neg Hx    Social History   Socioeconomic History   Marital status: Married    Spouse name: Not on file   Number of children: 2   Years of education: Not on file   Highest education level: Master's degree (e.g., MA, MS, MEng, MEd, MSW, MBA)  Occupational History   Occupation: Retired  Tobacco Use   Smoking status: Former    Current packs/day: 0.00    Average packs/day: 0.5 packs/day for 20.0 years (10.0 ttl pk-yrs)    Types: Cigarettes    Start date: 40    Quit date: 1978    Years since quitting: 47.3   Smokeless tobacco: Former    Types: Chew    Quit date: 2000   Tobacco  comments:    Scoal  Vaping Use   Vaping status: Never Used  Substance and Sexual Activity   Alcohol use: Yes    Comment: seldom   Drug use: Never   Sexual activity: Not Currently  Other Topics Concern   Not on file  Social History Narrative   Married, 2 adult children ([place] and [place]).   Lives in [place].     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   Tob (smoke and chew) x 20 yrs, quit approx 1980s.   Alcohol: very rare.   One cup coffee each morning.   Exercise: walks 3 miles about 4 times per week.   Active lifestyle   Two story home   Right handed   Social Drivers of Health   Financial Resource Strain: Low Risk  (09/18/2023)   Overall Financial Resource Strain (CARDIA)    Difficulty of Paying Living Expenses: Not hard at all  Food Insecurity: No Food Insecurity (09/18/2023)   Hunger Vital Sign    Worried About Running Out of Food in the Last Year: Never true    Ran Out of Food in the Last Year: Never true  Transportation Needs: No Transportation Needs (09/18/2023)   PRAPARE - Administrator, Civil Service (Medical): No    Lack of Transportation (Non-Medical): No  Physical Activity: Inactive (09/18/2023)   Exercise Vital Sign    Days of Exercise per Week: 0 days    Minutes of Exercise per Session: 30 min  Stress: No Stress Concern Present (09/18/2023)   [employer] of Occupational Health - Occupational Stress Questionnaire    Feeling of Stress : Only a little  Social Connections: Moderately Isolated (09/18/2023)   Social Connection and Isolation Panel [NHANES]    Frequency of Communication with Friends and Family: Three times a week    Frequency of Social Gatherings with Friends and Family: Once a week    Attends Religious Services: Never    Database administrator or Organizations: No    Attends Banker Meetings: Never    Marital Status: Married    Tobacco Counseling Counseling given: Not Answered Tobacco comments:  Scoal   Clinical Intake:  Pre-visit preparation completed: Yes  Pain : No/denies pain     Diabetes: No  How often do you need to have someone help you when you read instructions, pamphlets, or other written materials from your doctor or pharmacy?: 1 - Never  Interpreter Needed?: No  Information entered by :: Kieth Pelt LPN   Activities of Daily Living    09/18/2023    8:15 AM 09/15/2023    9:36 AM  In your present state of health, do you have any difficulty performing the following activities:  Hearing? 1 1  Vision? 0 0  Difficulty concentrating or making decisions? 0 0  Walking or climbing stairs? 1 1  Dressing or bathing? 1 1  Doing errands, shopping? 1 1  Preparing Food and eating ? Y Y  Using the Toilet? Y Y  In the past six months, have you accidently leaked urine? N N  Do you have problems with loss of bowel control? N N  Managing your Medications? Y Y  Managing your Finances? N N    Patient Care Team: Shelvia Dick, MD as PCP - General Eilleen Grates, MD as PCP - Cardiology (Cardiology) Florencio Hunting, MD as Consulting Physician (Urology) Eilleen Grates, MD as Consulting Physician (Cardiology) End, Veryl Gottron, MD as Consulting Physician (Cardiology) [place], Thornell Flirt, MD as Referring Physician (Dermatology) Secundino Dach Harvey Linen., MD as Consulting Physician (Urology) Candyce Champagne, MD as Consulting Physician (General Surgery) Danis, Cordelia Dessert, MD as Consulting Physician (Gastroenterology) Ellene Gustin, MD as Consulting Physician (Neurology)  Indicate any recent Medical Services you may have received from other than [facility] providers in the past year (date may be approximate).     Assessment:    This is a routine wellness examination for [place].  Hearing/Vision screen Hearing Screening - Comments:: Does not wear hearing aids Vision Screening - Comments:: Up to date Unsure of name   Goals Addressed             This Visit's Progress     Patient Stated       No goals       Depression Screen    09/18/2023    8:16 AM 05/03/2023    2:43 PM 09/11/2022    1:47 PM 02/28/2022    8:37 AM 01/11/2022    8:08 AM 09/06/2021    7:57 AM 07/10/2021    8:05 AM  PHQ 2/9 Scores  PHQ - 2 Score 5 0 0 0 0 0 0  PHQ- 9 Score 11          Fall Risk    09/18/2023    8:27 AM 09/18/2023    8:15 AM 09/15/2023    9:36 AM 09/04/2023    2:17 PM 05/03/2023    2:43 PM  Fall Risk   Falls in the past year? 1 1 1 1  0  Number falls in past yr: 1 1 1 1  0  Injury with Fall? 1 1 1 1  0  Risk for fall due to : Impaired balance/gait;Impaired mobility;History of fall(s) Impaired balance/gait;History of fall(s)   No Fall Risks  Follow up Falls evaluation completed;Education provided;Falls prevention discussed Falls evaluation completed;Education provided;Falls prevention discussed  Falls evaluation completed Falls evaluation completed    MEDICARE RISK AT HOME: Medicare Risk at Home Any stairs in or around the home?: Yes If so, are there any without handrails?: No Home free of loose throw rugs in walkways, pet beds, electrical cords, etc?: Yes Adequate lighting in your home to  reduce risk of falls?: Yes Life alert?: No Use of a cane, walker or w/c?: Yes Grab bars in the bathroom?: Yes Shower chair or bench in shower?: Yes Elevated toilet seat or a handicapped toilet?: Yes  TIMED UP AND GO:  Was the test performed?  No    Cognitive Function:    05/30/2018    9:13 AM 05/03/2017    8:20 AM  MMSE - Mini Mental State Exam  Orientation to time 5 5  Orientation to Place 5 5  Registration 3 3  Attention/ Calculation 5 3  Recall 2 3  Language- name 2 objects 2 2  Language- repeat 1 1  Language- follow 3 step command 3 3  Language- read & follow direction 1 1  Write a sentence 1 1  Copy design 1 1  Total score 29 28        09/11/2022    1:45 PM 09/06/2021    8:00 AM 08/31/2020    8:26 AM  6CIT Screen  What Year? 0 points 0 points 0 points   What month? 0 points 0 points 0 points  What time? 0 points 0 points 0 points  Count back from 20 0 points 0 points 0 points  Months in reverse 0 points 0 points 0 points  Repeat phrase 0 points 0 points 0 points  Total Score 0 points 0 points 0 points    Immunizations Immunization History  Administered Date(s) Administered   Fluad Quad(high Dose 65+) 01/08/2019, 01/11/2022   Fluad Trivalent(High Dose 65+) 01/16/2023   Influenza Split 02/18/2012   Influenza Whole 02/04/2010, 02/09/2011   Influenza, High Dose Seasonal PF 02/12/2014, 03/25/2015, 01/31/2017, 01/28/2018   Influenza,inj,Quad PF,6+ Mos 01/30/2013, 01/20/2016   Influenza-Unspecified 02/07/2020   PFIZER(Purple Top)SARS-COV-2 Vaccination 07/16/2019, 08/06/2019, 02/07/2020   Pneumococcal Conjugate-13 03/19/2014   Pneumococcal Polysaccharide-23 05/08/2007   Td 05/07/2006   Tdap 10/26/2016   Zoster Recombinant(Shingrix) 06/06/2018, 10/23/2018   Zoster, Live 10/31/2011    TDAP status: Up to date  Flu Vaccine status: Up to date  Pneumococcal vaccine status: Up to date  Covid-19 vaccine status: Information provided on how to obtain vaccines.   Qualifies for Shingles Vaccine? No   Zostavax completed Yes   Shingrix Completed?: No.    Education has been provided regarding the importance of this vaccine. Patient has been advised to call insurance company to determine out of pocket expense if they have not yet received this vaccine. Advised may also receive vaccine at local pharmacy or Health Dept. Verbalized acceptance and understanding.  Screening Tests Health Maintenance  Topic Date Due   COVID-19 Vaccine (4 - 2024-25 season) 01/06/2023   INFLUENZA VACCINE  12/06/2023   Medicare Annual Wellness (AWV)  09/17/2024   DTaP/Tdap/Td (3 - Td or Tdap) 10/27/2026   Pneumonia Vaccine 50+ Years old  Completed   Zoster Vaccines- Shingrix  Completed   HPV VACCINES  Aged Out   Meningococcal B Vaccine  Aged Out   Hepatitis C  Screening  Discontinued    Health Maintenance  Health Maintenance Due  Topic Date Due   COVID-19 Vaccine (4 - 2024-25 season) 01/06/2023    Colorectal cancer screening: No longer required.   Lung Cancer Screening: (Low Dose CT Chest recommended if Age 72-80 years, 20 pack-year currently smoking OR have quit w/in 15years.) does not qualify.   Lung Cancer Screening Referral:   Additional Screening:  Hepatitis C Screening: does not qualify; Completed   Vision Screening: Recommended annual ophthalmology exams for early  detection of glaucoma and other disorders of the eye. Is the patient up to date with their annual eye exam?  Yes  Who is the provider or what is the name of the office in which the patient attends annual eye exams? Unsure of name If pt is not established with a provider, would they like to be referred to a provider to establish care? No .   Dental Screening: Recommended annual dental exams for proper oral hygiene  Community Resource Referral / Chronic Care Management: CRR required this visit?  No   CCM required this visit?  No     Plan:     I have personally reviewed and noted the following in the patient's chart:   Medical and social history Use of alcohol, tobacco or illicit drugs  Current medications and supplements including opioid prescriptions. Patient is currently taking opioid prescriptions. Information provided to patient regarding non-opioid alternatives. Patient advised to discuss non-opioid treatment plan with their provider. Functional ability and status Nutritional status Physical activity Advanced directives List of other physicians Hospitalizations, surgeries, and ER visits in previous 12 months Vitals Screenings to include cognitive, depression, and falls Referrals and appointments  In addition, I have reviewed and discussed with patient certain preventive protocols, quality metrics, and best practice recommendations. A written  personalized care plan for preventive services as well as general preventive health recommendations were provided to patient.     Kieth Pelt, LPN   05/09/7251   After Visit Summary: (MyChart) Due to this being a telephonic visit, the after visit summary with patients personalized plan was offered to patient via MyChart   Nurse Notes:

## 2023-09-23 ENCOUNTER — Other Ambulatory Visit: Payer: Self-pay | Admitting: Family Medicine

## 2023-09-23 ENCOUNTER — Other Ambulatory Visit (HOSPITAL_BASED_OUTPATIENT_CLINIC_OR_DEPARTMENT_OTHER): Payer: Self-pay

## 2023-09-23 MED ORDER — OXYCODONE HCL 5 MG PO TABS
5.0000 mg | ORAL_TABLET | Freq: Three times a day (TID) | ORAL | 0 refills | Status: DC | PRN
  Filled 2023-09-23: qty 30, 10d supply, fill #0

## 2023-09-23 NOTE — Telephone Encounter (Signed)
 Requesting: oxycodone  Contract: N/A UDS: N/A Last Visit: 08/05/23 Next Visit: 11/06/23 Last Refill: 09/03/23 (30,0)  Please Advise. Rx pending

## 2023-09-29 ENCOUNTER — Other Ambulatory Visit: Payer: Self-pay

## 2023-10-07 ENCOUNTER — Encounter: Payer: Self-pay | Admitting: Family Medicine

## 2023-10-08 ENCOUNTER — Other Ambulatory Visit: Payer: Self-pay

## 2023-10-08 ENCOUNTER — Other Ambulatory Visit (HOSPITAL_BASED_OUTPATIENT_CLINIC_OR_DEPARTMENT_OTHER): Payer: Self-pay

## 2023-10-08 MED ORDER — ESCITALOPRAM OXALATE 10 MG PO TABS
10.0000 mg | ORAL_TABLET | Freq: Every day | ORAL | 1 refills | Status: DC
  Filled 2023-10-08: qty 90, 90d supply, fill #0

## 2023-10-08 NOTE — Telephone Encounter (Signed)
 Yes. Lexapro  prescription sent.

## 2023-10-11 ENCOUNTER — Other Ambulatory Visit (HOSPITAL_BASED_OUTPATIENT_CLINIC_OR_DEPARTMENT_OTHER): Payer: Self-pay

## 2023-10-11 ENCOUNTER — Other Ambulatory Visit: Payer: Self-pay | Admitting: Family Medicine

## 2023-10-11 MED ORDER — OXYCODONE HCL 5 MG PO TABS
5.0000 mg | ORAL_TABLET | Freq: Three times a day (TID) | ORAL | 0 refills | Status: DC | PRN
Start: 2023-10-11 — End: 2023-10-28
  Filled 2023-10-11: qty 30, 10d supply, fill #0

## 2023-10-16 ENCOUNTER — Other Ambulatory Visit (HOSPITAL_BASED_OUTPATIENT_CLINIC_OR_DEPARTMENT_OTHER): Payer: Self-pay

## 2023-10-16 MED ORDER — PREDNISOLONE ACETATE 1 % OP SUSP
1.0000 [drp] | Freq: Three times a day (TID) | OPHTHALMIC | 0 refills | Status: DC
  Filled 2023-10-16: qty 5, 28d supply, fill #0

## 2023-10-20 ENCOUNTER — Other Ambulatory Visit (HOSPITAL_BASED_OUTPATIENT_CLINIC_OR_DEPARTMENT_OTHER): Payer: Self-pay

## 2023-10-23 ENCOUNTER — Other Ambulatory Visit (HOSPITAL_BASED_OUTPATIENT_CLINIC_OR_DEPARTMENT_OTHER): Payer: Self-pay

## 2023-10-23 MED ORDER — PREDNISOLONE ACETATE 1 % OP SUSP: 1.0000 [drp] | Freq: Three times a day (TID) | OPHTHALMIC | 0 refills | Status: DC

## 2023-10-28 ENCOUNTER — Other Ambulatory Visit: Payer: Self-pay | Admitting: Family Medicine

## 2023-10-30 ENCOUNTER — Other Ambulatory Visit (HOSPITAL_BASED_OUTPATIENT_CLINIC_OR_DEPARTMENT_OTHER): Payer: Self-pay

## 2023-10-30 ENCOUNTER — Other Ambulatory Visit: Payer: Self-pay

## 2023-10-31 ENCOUNTER — Other Ambulatory Visit (HOSPITAL_BASED_OUTPATIENT_CLINIC_OR_DEPARTMENT_OTHER): Payer: Self-pay

## 2023-10-31 ENCOUNTER — Other Ambulatory Visit: Payer: Self-pay

## 2023-10-31 MED ORDER — OXYCODONE HCL 5 MG PO TABS
5.0000 mg | ORAL_TABLET | Freq: Three times a day (TID) | ORAL | 0 refills | Status: DC | PRN
  Filled 2023-10-31: qty 30, 10d supply, fill #0

## 2023-11-01 ENCOUNTER — Other Ambulatory Visit (HOSPITAL_BASED_OUTPATIENT_CLINIC_OR_DEPARTMENT_OTHER): Payer: Self-pay

## 2023-11-06 ENCOUNTER — Encounter: Payer: Self-pay | Admitting: Family Medicine

## 2023-11-06 ENCOUNTER — Ambulatory Visit (INDEPENDENT_AMBULATORY_CARE_PROVIDER_SITE_OTHER): Admitting: Family Medicine

## 2023-11-06 ENCOUNTER — Other Ambulatory Visit (HOSPITAL_BASED_OUTPATIENT_CLINIC_OR_DEPARTMENT_OTHER): Payer: Self-pay

## 2023-11-06 VITALS — BP 140/80 | HR 73 | Ht 69.0 in

## 2023-11-06 DIAGNOSIS — F331 Major depressive disorder, recurrent, moderate: Secondary | ICD-10-CM

## 2023-11-06 DIAGNOSIS — F4323 Adjustment disorder with mixed anxiety and depressed mood: Secondary | ICD-10-CM | POA: Diagnosis not present

## 2023-11-06 DIAGNOSIS — R29818 Other symptoms and signs involving the nervous system: Secondary | ICD-10-CM

## 2023-11-06 DIAGNOSIS — I10 Essential (primary) hypertension: Secondary | ICD-10-CM

## 2023-11-06 DIAGNOSIS — M79605 Pain in left leg: Secondary | ICD-10-CM | POA: Diagnosis not present

## 2023-11-06 DIAGNOSIS — M79604 Pain in right leg: Secondary | ICD-10-CM | POA: Diagnosis not present

## 2023-11-06 MED ORDER — MELOXICAM 15 MG PO TABS
15.0000 mg | ORAL_TABLET | Freq: Every day | ORAL | 2 refills | Status: DC
  Filled 2023-11-06 – 2023-11-26 (×2): qty 90, 90d supply, fill #0

## 2023-11-06 MED ORDER — ESCITALOPRAM OXALATE 20 MG PO TABS
20.0000 mg | ORAL_TABLET | Freq: Every day | ORAL | 2 refills | Status: DC
  Filled 2023-11-06: qty 90, 90d supply, fill #0

## 2023-11-06 MED ORDER — OXYCODONE HCL 5 MG PO TABS
5.0000 mg | ORAL_TABLET | Freq: Three times a day (TID) | ORAL | 0 refills | Status: DC | PRN
  Filled 2023-11-06: qty 90, fill #0
  Filled 2023-11-14: qty 90, 15d supply, fill #0

## 2023-11-06 NOTE — Progress Notes (Signed)
 OFFICE VISIT  11/06/2023  CC:  Chief Complaint  Patient presents with   Medical Management of Chronic Issues    Patient is a 83 y.o. male who presents accompanied by his wife William Montgomery for 21-month follow-up anxiety, legs stiffness and pain, blood pressure monitoring. A/P as of last visit: 1 panic attacks. These were exclusively at night and he is doing very well with alprazolam  1 mg every night.   2) Progressive generalized weakness with gait abnormality and dysarthria. Extensive workup with Dr. Leigh. He is status post second opinion with [facility]. Current diagnosis is corticobasal syndrome. He is essentially immobile now, confined to wheelchair and bed.  He has chosen to d/c PT and ST. He will be getting a motorized wheelchair soon that will allow much more flexibility in his body position and assist with transferring. He does not think the Sinemet  2 tabs 3 times daily is helping any.  He sometimes goes a week or so without it and his wife does note that his inability to initiate movement with his legs gets worse. He has no side effects from it. He has neurology follow-up pretty soon.   3.  Bilateral legs aching. Unknown etiology but probably due to stiffness and immobility from his disease as well as sitting in a wheelchair and not moving legs for hours at a time. Trial of meloxicam  15 mg a day, #30, refill x 2. Oxycodone  5mg , 1 tid prn severe pain, #30.  INTERIM HX: Neurologic symptoms progressing.  He can hardly speak now. No longer taking sinemet  because it was not helping anything. There is nothing the neurologic specialists can offer.  About 1 month ago we started him on Lexapro  10 mg a day for depression and he does feel like this has helped some.  Significantly less crying spells. He does not ask for help when he slumps over in his chair.  His wife is working and he is at home alone much of the time.  He has all the necessary accommodations to allow him to function alone  for the most part unless he slumps to 1 side due to significant postural instability.  He refuses to call his wife for help because he does not want to be a bother.  Sometimes he lays slumped over for hours at a time.  He uses oxycodone  5 mg 3 times a day for pain but states #30 tabs per month is not enough. He also takes meloxicam  daily and 2-3 doses of Tylenol  daily.    PMP AWARE reviewed today: most recent rx for oxycodone  5 mg was filled 11/01/2023, # 30, rx by me.  Most recent alprazolam  1 mg prescription was filled 10/31/2023, #30, prescription by me. No red flags.  Review of systems: No fever, no constipation or diarrhea, no oversedation, no nausea or vomiting.  No abdominal pain.  No chest pain or shortness of breath.  Past Medical History:  Diagnosis Date   Arthritis    joints/  back/  neck   Chronic headaches    due to cervical spondylosis   Chronic renal insufficiency, stage 2 (mild)    borderlines II/III   Coronary artery disease 1999   cardiologist--- dr lavona---  (per cardiology note pt refused to take asa/ statin) hx PCI w/ stenting in 1999 to LAD and 12-23-2007 x2 DES to proximal LAD;  Cath 03/2017-->  non- critical 70 - 80% mid/distal circ stenosis,  chronic small D1 80% stenosis (stable) and DES to proximal and mid LAD--  Med mgmt rec'd-->nitrates caused HAs, ACE-I caused cough.   Corticobasal syndrome (HCC)    [facility] 04/2023   DDD (degenerative disc disease)    lumbar and cervical neck   Diverticulosis of sigmoid colon 07/25/2011   Severe (endoscopy by Dr. Jakie)   Erectile dysfunction after radical prostatectomy    History of kidney stones    History of prostate cancer 12/2014   urologist-- dr matilda---   s/p Robot assisted radical prostatectomy 07/2015: lymph node involvement//   undetactable PSA since   HTN (hypertension)    ACE-I cough.  Fine on ARB.   Hx of blastomycosis 1978   s/p  right upper lobectomy   Hyperlipidemia    NASH  (nonalcoholic steatohepatitis) 06/2011   normal LFTS  /  Hep B and C testing neg.  Mild fatty liver on CT 2020   Nocturia    Parkinsonism (HCC)    2023/24, unknown etiology (Dat scan neg), peripheral nerve bx, and spinal fluid testing all NEG/Norma. [facility] 2nd opinion-->corticobasal syndrome 04/2023)   Pseudobulbar affect    Pulmonary nodules    small, picked up on CT ab/pelv for abd pains/flank pains-->pt quit smoking 40 yrs ago.  Low risk for lung ca so no f/u CT needed.   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   Tinnitus    Wears glasses    Wears hearing aid in both ears     Past Surgical History:  Procedure Laterality Date   ANTERIOR CERVICAL DECOMP/DISCECTOMY FUSION  1982   C6--7   APPENDECTOMY  1953   CARDIAC CATHETERIZATION  02/27/2000   @[facility]  by dr hochrein ---  single vessel w/ preserved LVSF   CORONARY ANGIOPLASTY WITH STENT PLACEMENT  12/23/2007   by Dr End--- PCI w/ DES x2 to  pLAD   CORONARY STENT PLACEMENT  1999   PTCA w/ stenting to LAd   CYSTOSCOPY WITH RETROGRADE PYELOGRAM, URETEROSCOPY AND STENT PLACEMENT Left 05/10/2018   Procedure: CYSTOSCOPY WITH LEFT RETROGRADE PYELOGRAM, LEFT URETEROSCOPY AND LEFT URETERAL STENT PLACEMENT;  Surgeon: Alvaro Hummer, MD;  Location: [facility];  Service: Urology;  Laterality: Left;   HOLMIUM LASER APPLICATION Left 05/10/2018   Procedure: HOLMIUM LASER APPLICATION;  Surgeon: Alvaro Hummer, MD;  Location: [facility];  Service: Urology;  Laterality: Left;   INCISIONAL HERNIA REPAIR N/A 03/19/2019   Procedure: LAPAROSCOPIC INCISIONAL HERNIA REPAIR WITH MESH, RECURRENT UMBILICAL HERNIA REPAIR;  Surgeon: Sheldon Standing, MD;  Location: [facility];  Service: General;  Laterality: N/A;   INGUINAL HERNIA REPAIR Right 03/19/2019   Procedure: LAPAROSCOPIC BILATERAL FEMORAL AND  INGUINAL HERNIA REPAIR WITH MESH, LYSIS OF ADHESIONS;  Surgeon: Sheldon Standing, MD;  Location: [facility];  Service: General;  Laterality: Right;   LEFT HEART CATH AND CORONARY ANGIOGRAPHY N/A 03/21/2017   Stents patent; mod circ dz, nothing for intervention--imdur  added.  Procedure: LEFT HEART CATH AND CORONARY ANGIOGRAPHY;  Surgeon: Mady Bruckner, MD;  Location: [facility];  Service: Cardiovascular;  Laterality: N/A;   LUMBAR SPINE SURGERY  1978   L5--S1   LUNG LOBECTOMY Right 1978   right upper lobectomy for blastomycosis   LYMPHADENECTOMY Bilateral 07/14/2015   Procedure: LYMPHADENECTOMY;  Surgeon: Gretel Ferrara, MD;  Location: [facility];  Service: Urology;  Laterality: Bilateral;   ROBOT ASSISTED LAPAROSCOPIC  RADICAL PROSTATECTOMY N/A 07/14/2015   One positive pelvic LN.  Procedure: XI ROBOTIC ASSISTED LAPAROSCOPIC RADICAL PROSTATECTOMY LEVEL 2;  Surgeon: Gretel Ferrara, MD;  Location: [facility];  Service: Urology;  Laterality: N/A;   ROTATOR CUFF REPAIR  2007   UMBILICAL HERNIA REPAIR  07/14/2015   Procedure: HERNIA REPAIR UMBILICAL ADULT;  Surgeon: Gretel Ferrara, MD;  Location: [facility];  Service: Urology;;    Outpatient Medications Prior to Visit  Medication Sig Dispense Refill   ALPRAZolam  (XANAX ) 1 MG tablet Take 1 tablet (1 mg total) by mouth daily as needed for severe anxiety. 30 tablet 5   Dextromethorphan -quiNIDine (NUEDEXTA ) 20-10 MG capsule Take 1 capsule by mouth 2 (two) times daily. 60 capsule 11   prednisoLONE  acetate (PRED FORTE ) 1 % ophthalmic suspension Place 1 drop into both eyes 3 (three) times daily for 1 week, then as needed. 5 mL 0   escitalopram  (LEXAPRO ) 10 MG tablet Take 1 tablet (10 mg total) by mouth daily. 90 tablet 1   oxyCODONE  (OXY IR/ROXICODONE ) 5 MG immediate release tablet Take 1 tablet (5 mg total) by mouth 3 (three) times daily as needed for pain. 30 tablet 0   carbidopa -levodopa  (SINEMET  IR) 25-100 MG tablet Take up to 2 tablets by mouth three times daily   Per schedule given 540 tablet 1   meloxicam  (MOBIC ) 15 MG tablet Take 1 tablet (15 mg total) by mouth daily  for achy legs 30 tablet 2   No facility-administered medications prior to visit.    Allergies  Allergen Reactions   Ace Inhibitors Cough   Imdur  [Isosorbide  Dinitrate] Other (See Comments)    Headache    Review of Systems As per HPI  PE:    11/06/2023    8:30 AM 11/06/2023    8:05 AM 09/04/2023    2:16 PM  Vitals with BMI  Height  5' 9   Weight   163 lbs  Systolic 140 152 844  Diastolic 80 89 81  Pulse  73 64     Physical Exam  Gen: Alert, sitting in wheelchair very still.  Struggles to get his thoughts into words.  Most of his words are slow and unintelligible. No further exam today.  LABS:  Last metabolic panel Lab Results  Component Value Date   GLUCOSE 116 (H) 09/03/2022   NA 136 09/03/2022   K 3.9 09/03/2022   CL 99 09/03/2022   CO2 26 09/03/2022   BUN 22 09/03/2022   CREATININE 1.02 09/03/2022   GFRNONAA >60 09/03/2022   CALCIUM  8.9 09/03/2022   PROT 7.2 09/03/2022   ALBUMIN 4.8 01/30/2023   LABGLOB 2.8 09/04/2022   BILITOT 0.7 09/03/2022   ALKPHOS 86 09/03/2022   AST 23 09/03/2022   ALT 26 09/03/2022   ANIONGAP 11 09/03/2022   Last lipids Lab Results  Component Value Date   CHOL 163 09/03/2022   HDL 50 09/03/2022   LDLCALC 92 09/03/2022   LDLDIRECT 113.0 12/30/2020   TRIG 104 09/03/2022   CHOLHDL 3.3 09/03/2022   Lab Results  Component Value Date   WBC 5.7 09/03/2022   HGB 14.3 09/03/2022   HCT 43.2 09/03/2022   MCV 102.0 (H) 09/03/2022   PLT 120 (L) 09/03/2022   Lab Results  Component Value Date   TSH 3.373 09/03/2022   IMPRESSION AND PLAN:  #1 anxiety and depression. He has had a little bit of response to Lexapro .  Will increase dose to 20 mg a day. He does have alprazolam  1  mg to take as needed.  2.  Chronic bilateral leg pain. Will continue 5 mg tab, 1-2 3 times a day as needed, dispense number 90/month. Continue  meloxicam  15 mg a day and okay to continue Tylenol  500 mg to 1000 mg twice daily to 3 times daily as needed.  #3 corticobasal syndrome, progressing.  Nothing further neurology can offer. He has all necessary home accommodations but either slumped over in wheelchair and cannot get back in the normal posture or occasionally even falls out of wheelchair to the floor and cannot get himself up.  His wife is going to start staying with him 24/7 now, may need extension/changing of her FMLA information.  #4 hypertension.  Blood pressure is reasonable. He declines medication.  An After Visit Summary was printed and given to the patient.  FOLLOW UP: Return in about 6 weeks (around 12/18/2023) for routine chronic illness f/u.  Signed:  Gerlene Hockey, MD           11/06/2023

## 2023-11-07 ENCOUNTER — Telehealth: Payer: Self-pay

## 2023-11-07 NOTE — Telephone Encounter (Signed)
 Patient dropped off document FMLA, to be filled out by provider. Patient requested to send it back via Fax within 7-days. Document is located in providers tray at front office.

## 2023-11-11 DIAGNOSIS — Z0279 Encounter for issue of other medical certificate: Secondary | ICD-10-CM

## 2023-11-11 NOTE — Telephone Encounter (Signed)
 Type of forms received:  FMLA  Routed to:  [employer]  Paperwork received by :   Lonell   Individual made aware of 5-7 business day turn around (Y/N): Y  Form completed and patient made aware of charges(Y/N): N/A  Faxed to :   Form location:  [place] inbox front office

## 2023-11-11 NOTE — Telephone Encounter (Signed)
 Signed and put in box to go up front., Signed:  Gerlene Hockey, MD           11/11/2023

## 2023-11-11 NOTE — Telephone Encounter (Signed)
 Updated FMLA forms placed in PCP office

## 2023-11-14 ENCOUNTER — Other Ambulatory Visit (HOSPITAL_BASED_OUTPATIENT_CLINIC_OR_DEPARTMENT_OTHER): Payer: Self-pay

## 2023-11-26 ENCOUNTER — Other Ambulatory Visit (HOSPITAL_BASED_OUTPATIENT_CLINIC_OR_DEPARTMENT_OTHER): Payer: Self-pay

## 2023-11-26 ENCOUNTER — Other Ambulatory Visit: Payer: Self-pay

## 2023-11-28 ENCOUNTER — Other Ambulatory Visit (HOSPITAL_BASED_OUTPATIENT_CLINIC_OR_DEPARTMENT_OTHER): Payer: Self-pay

## 2023-11-29 ENCOUNTER — Other Ambulatory Visit (HOSPITAL_BASED_OUTPATIENT_CLINIC_OR_DEPARTMENT_OTHER): Payer: Self-pay

## 2023-12-11 ENCOUNTER — Ambulatory Visit: Payer: Self-pay

## 2023-12-11 NOTE — Telephone Encounter (Signed)
 Pt declined to bring urine sample today due to the weather. Pt states he will wait until appt tomorrow

## 2023-12-11 NOTE — Telephone Encounter (Signed)
 Pts wife would like to bring a urine sample in. Please advise

## 2023-12-11 NOTE — Telephone Encounter (Signed)
 Pt has upcoming appt tomorrow, please advise if sample can be done prior or collect sample tomorrow.

## 2023-12-11 NOTE — Telephone Encounter (Signed)
 FYI Only or Action Required?: FYI only for provider.  Patient was last seen in primary care on 11/06/2023 by McGowen, Aleene DEL, MD.  Called Nurse Triage reporting Dysuria.  Symptoms began several days ago.  Interventions attempted: Nothing.  Symptoms are: unchanged.  Triage Disposition: See Physician Within 24 Hours  Patient/caregiver understands and will follow disposition?: Yes       Copied from CRM #[id number]. Topic: Clinical - Red Word Triage >> Dec 11, 2023  8:28 AM [place] A wrote: [facility] that prompted transfer to Nurse Triage: William Montgomery Patient's wife called patient is having possible UTI. Pt has pain, urgency and burning when urinating. Reason for Disposition  All other males with painful urination  Answer Assessment - Initial Assessment Questions 1. SEVERITY: How bad is the pain?  (e.g., Scale 1-10; mild, moderate, or severe)     Pt did not say 2. FREQUENCY: How many times have you had painful urination today?      no 3. PATTERN: Is pain present every time you urinate or just sometimes?      Just sometimes 4. ONSET: When did the painful urination start?      3-4 days 5. FEVER: Do you have a fever? If Yes, ask: What is your temperature, how was it measured, and when did it start?     no 6. PAST UTI: Have you had a urine infection before? If Yes, ask: When was the last time? and What happened that time?      Never had one 7. CAUSE: What do you think is causing the painful urination?      uti 8. OTHER SYMPTOMS: Do you have any other symptoms? (e.g., flank pain, penis discharge, scrotal pain, blood in urine)     Hard time getting started  Protocols used: Urination Pain - Male-A-AH

## 2023-12-11 NOTE — Telephone Encounter (Signed)
 Please assist patient with scheduling, thanks.

## 2023-12-11 NOTE — Telephone Encounter (Signed)
 Ok to bring sample in for UA and culture

## 2023-12-12 ENCOUNTER — Encounter: Payer: Self-pay | Admitting: Family Medicine

## 2023-12-12 ENCOUNTER — Ambulatory Visit (INDEPENDENT_AMBULATORY_CARE_PROVIDER_SITE_OTHER): Admitting: Family Medicine

## 2023-12-12 ENCOUNTER — Other Ambulatory Visit (HOSPITAL_BASED_OUTPATIENT_CLINIC_OR_DEPARTMENT_OTHER): Payer: Self-pay

## 2023-12-12 VITALS — Ht 69.0 in

## 2023-12-12 DIAGNOSIS — R300 Dysuria: Secondary | ICD-10-CM

## 2023-12-12 DIAGNOSIS — R3915 Urgency of urination: Secondary | ICD-10-CM

## 2023-12-12 DIAGNOSIS — N3000 Acute cystitis without hematuria: Secondary | ICD-10-CM

## 2023-12-12 MED ORDER — SULFAMETHOXAZOLE-TRIMETHOPRIM 800-160 MG PO TABS
1.0000 | ORAL_TABLET | Freq: Two times a day (BID) | ORAL | 0 refills | Status: DC
  Filled 2023-12-12: qty 28, 14d supply, fill #0

## 2023-12-12 NOTE — Progress Notes (Signed)
 OFFICE VISIT  12/12/2023  CC: No chief complaint on file.   Patient is a 83 y.o. male who presents for dysuria.  HPI: William Montgomery was checked in but never made it back to a room.  He was attempting to give a urine specimen in the lobby bathroom and forced out stool and it was very messy and he chose to go home instead of come into exam room. His symptoms are: Urinary frequency and urgency and decreased force of stream.  It does hurt to void some. No blood in urine.  No fever or flank pain. Last time he emptied a significant amount of urine was a couple of hours ago.  Past Medical History:  Diagnosis Date   Arthritis    joints/  back/  neck   Chronic headaches    due to cervical spondylosis   Chronic renal insufficiency, stage 2 (mild)    borderlines II/III   Coronary artery disease 1999   cardiologist--- dr lavona---  (per cardiology note pt refused to take asa/ statin) hx PCI w/ stenting in 1999 to LAD and 12-23-2007 x2 DES to proximal LAD;  Cath 03/2017-->  non- critical 70 - 80% mid/distal circ stenosis,  chronic small D1 80% stenosis (stable) and DES to proximal and mid LAD--   Med mgmt rec'd-->nitrates caused HAs, ACE-I caused cough.   Corticobasal syndrome (HCC)    [facility] 04/2023   DDD (degenerative disc disease)    lumbar and cervical neck   Diverticulosis of sigmoid colon 07/25/2011   Severe (endoscopy by Dr. Jakie)   Erectile dysfunction after radical prostatectomy    History of kidney stones    History of prostate cancer 12/2014   urologist-- dr matilda---   s/p Robot assisted radical prostatectomy 07/2015: lymph node involvement//   undetactable PSA since   HTN (hypertension)    ACE-I cough.  Fine on ARB.   Hx of blastomycosis 1978   s/p  right upper lobectomy   Hyperlipidemia    NASH (nonalcoholic steatohepatitis) 06/2011   normal LFTS  /  Hep B and C testing neg.  Mild fatty liver on CT 2020   Nocturia    Parkinsonism (HCC)    2023/24, unknown etiology  (Dat scan neg), peripheral nerve bx, and spinal fluid testing all NEG/Norma. [facility] 2nd opinion-->corticobasal syndrome 04/2023)   Pseudobulbar affect    Pulmonary nodules    small, picked up on CT ab/pelv for abd pains/flank pains-->pt quit smoking 40 yrs ago.  Low risk for lung ca so no f/u CT needed.   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   Tinnitus    Wears glasses    Wears hearing aid in both ears     Past Surgical History:  Procedure Laterality Date   ANTERIOR CERVICAL DECOMP/DISCECTOMY FUSION  1982   C6--7   APPENDECTOMY  1953   CARDIAC CATHETERIZATION  02/27/2000   @[facility]  by dr hochrein ---  single vessel w/ preserved LVSF   CORONARY ANGIOPLASTY WITH STENT PLACEMENT  12/23/2007   by Dr End--- PCI w/ DES x2 to  pLAD   CORONARY STENT PLACEMENT  1999   PTCA w/ stenting to LAd   CYSTOSCOPY WITH RETROGRADE PYELOGRAM, URETEROSCOPY AND STENT PLACEMENT Left 05/10/2018   Procedure: CYSTOSCOPY WITH LEFT RETROGRADE PYELOGRAM,  LEFT URETEROSCOPY AND LEFT URETERAL STENT PLACEMENT;  Surgeon: Alvaro Hummer, MD;  Location: [facility];  Service: Urology;  Laterality: Left;   HOLMIUM LASER APPLICATION Left 05/10/2018   Procedure: HOLMIUM LASER APPLICATION;  Surgeon: Alvaro Hummer, MD;  Location: [facility];  Service: Urology;  Laterality: Left;   INCISIONAL HERNIA REPAIR N/A 03/19/2019   Procedure: LAPAROSCOPIC INCISIONAL HERNIA REPAIR WITH MESH, RECURRENT UMBILICAL HERNIA REPAIR;  Surgeon: Sheldon Standing, MD;  Location: [facility];  Service: General;  Laterality: N/A;   INGUINAL HERNIA REPAIR Right 03/19/2019   Procedure: LAPAROSCOPIC BILATERAL FEMORAL AND INGUINAL HERNIA REPAIR WITH MESH, LYSIS OF ADHESIONS;  Surgeon: Sheldon Standing, MD;  Location: [facility];  Service: General;  Laterality: Right;   LEFT HEART CATH AND CORONARY  ANGIOGRAPHY N/A 03/21/2017   Stents patent; mod circ dz, nothing for intervention--imdur  added.  Procedure: LEFT HEART CATH AND CORONARY ANGIOGRAPHY;  Surgeon: Mady Bruckner, MD;  Location: [facility];  Service: Cardiovascular;  Laterality: N/A;   LUMBAR SPINE SURGERY  1978   L5--S1   LUNG LOBECTOMY Right 1978   right upper lobectomy for blastomycosis   LYMPHADENECTOMY Bilateral 07/14/2015   Procedure: LYMPHADENECTOMY;  Surgeon: Gretel Ferrara, MD;  Location: [facility];  Service: Urology;  Laterality: Bilateral;   ROBOT ASSISTED LAPAROSCOPIC RADICAL PROSTATECTOMY N/A 07/14/2015   One positive pelvic LN.  Procedure: XI ROBOTIC ASSISTED LAPAROSCOPIC RADICAL PROSTATECTOMY LEVEL 2;  Surgeon: Gretel Ferrara, MD;  Location: [facility];  Service: Urology;  Laterality: N/A;   ROTATOR CUFF REPAIR  2007   UMBILICAL HERNIA REPAIR  07/14/2015   Procedure: HERNIA REPAIR UMBILICAL ADULT;  Surgeon: Gretel Ferrara, MD;  Location: [facility];  Service: Urology;;    Outpatient Medications Prior to Visit  Medication Sig Dispense Refill   ALPRAZolam  (XANAX ) 1 MG tablet Take 1 tablet (1 mg total) by mouth daily as needed for severe anxiety. 30 tablet 5   Dextromethorphan -quiNIDine (NUEDEXTA ) 20-10 MG capsule Take 1 capsule by mouth 2 (two) times daily. 60 capsule 11   escitalopram  (LEXAPRO ) 20 MG tablet Take 1 tablet (20 mg total) by mouth daily. 90 tablet 2   meloxicam  (MOBIC ) 15 MG tablet Take 1 tablet (15 mg total) by mouth daily  for achy legs 90 tablet 2   oxyCODONE  (OXY IR/ROXICODONE ) 5 MG immediate release tablet Take 1-2 tablets (5-10 mg total) by mouth 3 (three) times daily as needed. 90 tablet 0   prednisoLONE  acetate (PRED FORTE ) 1 % ophthalmic suspension Place 1 drop into both eyes 3 (three) times daily for 1 week, then as needed. 5 mL 0   No facility-administered medications prior to visit.    Allergies  Allergen Reactions   Ace Inhibitors Cough   Imdur  [Isosorbide  Dinitrate] Other (See Comments)     Headache    Review of Systems  As per HPI  PE:    11/06/2023    8:30 AM 11/06/2023    8:05 AM 09/04/2023    2:16 PM  Vitals with BMI  Height  5' 9   Weight   163 lbs  Systolic 140 152 844  Diastolic 80 89 81  Pulse  73 64     Physical Exam  No exam  LABS:  Last metabolic panel Lab Results  Component Value Date   GLUCOSE 116 (H) 09/03/2022   NA 136 09/03/2022   K 3.9 09/03/2022   CL 99 09/03/2022   CO2 26 09/03/2022   BUN 22 09/03/2022   CREATININE 1.02 09/03/2022   GFRNONAA >  60 09/03/2022   CALCIUM  8.9 09/03/2022   PROT 7.2 09/03/2022   ALBUMIN 4.8 01/30/2023   LABGLOB 2.8 09/04/2022   BILITOT 0.7 09/03/2022   ALKPHOS 86 09/03/2022   AST 23 09/03/2022   ALT 26 09/03/2022   ANIONGAP 11 09/03/2022   Lab Results  Component Value Date   PSA <0.04 05/06/2023   PSA <0.015 05/10/2022   PSA <0.015 05/19/2021   IMPRESSION AND PLAN:  Lower urinary tract infection. Low suspicion of complete obstruction. Will start Bactrim  double strength twice daily x 14 days. Bring urine sample in for UA and culture as soon as possible.   An After Visit Summary was printed and given to the patient.  FOLLOW UP: No follow-ups on file.  Signed:  Gerlene Hockey, MD           12/12/2023

## 2023-12-24 ENCOUNTER — Other Ambulatory Visit: Payer: Self-pay | Admitting: Family Medicine

## 2023-12-25 ENCOUNTER — Other Ambulatory Visit (HOSPITAL_BASED_OUTPATIENT_CLINIC_OR_DEPARTMENT_OTHER): Payer: Self-pay

## 2023-12-25 ENCOUNTER — Encounter: Payer: Self-pay | Admitting: Family Medicine

## 2023-12-25 ENCOUNTER — Ambulatory Visit (INDEPENDENT_AMBULATORY_CARE_PROVIDER_SITE_OTHER): Admitting: Family Medicine

## 2023-12-25 VITALS — BP 140/80 | HR 68 | Temp 97.8°F | Ht 69.0 in

## 2023-12-25 DIAGNOSIS — F411 Generalized anxiety disorder: Secondary | ICD-10-CM

## 2023-12-25 DIAGNOSIS — M79605 Pain in left leg: Secondary | ICD-10-CM

## 2023-12-25 DIAGNOSIS — F324 Major depressive disorder, single episode, in partial remission: Secondary | ICD-10-CM | POA: Diagnosis not present

## 2023-12-25 DIAGNOSIS — N410 Acute prostatitis: Secondary | ICD-10-CM

## 2023-12-25 DIAGNOSIS — Z79899 Other long term (current) drug therapy: Secondary | ICD-10-CM

## 2023-12-25 DIAGNOSIS — M79604 Pain in right leg: Secondary | ICD-10-CM

## 2023-12-25 DIAGNOSIS — G894 Chronic pain syndrome: Secondary | ICD-10-CM

## 2023-12-25 DIAGNOSIS — M150 Primary generalized (osteo)arthritis: Secondary | ICD-10-CM

## 2023-12-25 DIAGNOSIS — G8929 Other chronic pain: Secondary | ICD-10-CM

## 2023-12-25 MED ORDER — OXYCODONE HCL 5 MG PO TABS
5.0000 mg | ORAL_TABLET | Freq: Three times a day (TID) | ORAL | 0 refills | Status: DC | PRN
  Filled 2023-12-25: qty 90, 15d supply, fill #0

## 2023-12-25 MED ORDER — SULFAMETHOXAZOLE-TRIMETHOPRIM 800-160 MG PO TABS
1.0000 | ORAL_TABLET | Freq: Two times a day (BID) | ORAL | 0 refills | Status: AC
  Filled 2023-12-25: qty 14, 7d supply, fill #0

## 2023-12-25 NOTE — Telephone Encounter (Signed)
 Pt has appt today

## 2023-12-25 NOTE — Progress Notes (Signed)
 OFFICE VISIT  12/25/2023  CC:  Chief Complaint  Patient presents with   Medical Management of Chronic Issues    Patient is a 83 y.o. male who presents accompanied by his wife Erminio for 6-week follow-up anxiety, depression, hypertension, and chronic bilateral leg pain. A/P as of last visit: #1 anxiety and depression. He has had a little bit of response to Lexapro .  Will increase dose to 20 mg a day. He does have alprazolam  1 mg to take as needed.   2.  Chronic bilateral leg pain. Will continue 5 mg tab, 1-2 3 times a day as needed, dispense number 90/month. Continue meloxicam  15 mg a day and okay to continue Tylenol  500 mg to 1000 mg twice daily to 3 times daily as needed.   #3 corticobasal syndrome, progressing.  Nothing further neurology can offer. He has all necessary home accommodations but either slumped over in wheelchair and cannot get back in the normal posture or occasionally even falls out of wheelchair to the floor and cannot get himself up.  His wife is going to start staying with him 24/7 now, may need extension/changing of her FMLA information.   #4 hypertension.  Blood pressure is reasonable. He declines medication.  INTERIM HX: On 12/12/2023 I treated him with Bactrim  for 14 days for symptoms of lower urinary tract infection, see encounter note 12/12/2023.  His urinary symptoms are almost completely resolved.  He finishes antibiotic tomorrow.  Intake of fluids has improved.  He does feel like the increase in the Lexapro  dose has helped. He continues to take 1 Xanax  twice a day and this is helpful.  His pain is well-controlled with Voltaren as well as daily meloxicam  and he takes oxycodone  fairly regularly (neck and shoulders pain, bilateral legs pain.   PMP AWARE reviewed today: most recent rx for alprazolam  was filled 11/29/2023, # 30, rx by me.  Most recent oxycodone  prescription was filled 11/15/2023, #90, prescription by me. No red flags.  Review of systems: He  occasionally has a bit of choking with thin liquids. No fever, shortness of breath, headaches,, or rash. He does occasionally have some visual hallucinations at night but these are not scary or bothersome.  Past Medical History:  Diagnosis Date   Arthritis    joints/  back/  neck   Chronic headaches    due to cervical spondylosis   Chronic renal insufficiency, stage 2 (mild)    borderlines II/III   Coronary artery disease 1999   cardiologist--- dr lavona---  (per cardiology note pt refused to take asa/ statin) hx PCI w/ stenting in 1999 to LAD and 12-23-2007 x2 DES to proximal LAD;  Cath 03/2017-->  non- critical 70 - 80% mid/distal circ stenosis,  chronic small D1 80% stenosis (stable) and DES to proximal and mid LAD--   Med mgmt rec'd-->nitrates caused HAs, ACE-I caused cough.   Corticobasal syndrome (HCC)    [facility] 04/2023   DDD (degenerative disc disease)    lumbar and cervical neck   Diverticulosis of sigmoid colon 07/25/2011   Severe (endoscopy by Dr. Jakie)   Erectile dysfunction after radical prostatectomy    History of kidney stones    History of prostate cancer 12/2014   urologist-- dr matilda---   s/p Robot assisted radical prostatectomy 07/2015: lymph node involvement//   undetactable PSA since   HTN (hypertension)    ACE-I cough.  Fine on ARB.   Hx of blastomycosis 1978   s/p  right upper lobectomy  Hyperlipidemia    NASH (nonalcoholic steatohepatitis) 06/2011   normal LFTS  /  Hep B and C testing neg.  Mild fatty liver on CT 2020   Nocturia    Parkinsonism (HCC)    2023/24, unknown etiology (Dat scan neg), peripheral nerve bx, and spinal fluid testing all NEG/Norma. [facility] 2nd opinion-->corticobasal syndrome 04/2023)   Pseudobulbar affect    Pulmonary nodules    small, picked up on CT ab/pelv for abd pains/flank pains-->pt quit smoking 40 yrs ago.  Low risk for lung ca so no f/u CT needed.   Recurrent depression (HCC)    S/P drug eluting coronary  stent placement 1999   x2 LAD in 1999;  and 08/ 2009;;   x2 LAD proximal and mid in 03-21-2017   Spondylolysis of cervical spine    severe w/ cervicalgia   Tinnitus    Wears glasses    Wears hearing aid in both ears     Past Surgical History:  Procedure Laterality Date   ANTERIOR CERVICAL DECOMP/DISCECTOMY FUSION  1982   C6--7   APPENDECTOMY  1953   CARDIAC CATHETERIZATION  02/27/2000   @[facility]  by dr hochrein ---  single vessel w/ preserved LVSF   CORONARY ANGIOPLASTY WITH STENT PLACEMENT  12/23/2007   by Dr End--- PCI w/ DES x2 to  pLAD   CORONARY STENT PLACEMENT  1999   PTCA w/ stenting to LAd   CYSTOSCOPY WITH RETROGRADE PYELOGRAM, URETEROSCOPY AND STENT PLACEMENT Left 05/10/2018   Procedure: CYSTOSCOPY WITH LEFT RETROGRADE PYELOGRAM, LEFT URETEROSCOPY AND LEFT URETERAL STENT PLACEMENT;  Surgeon: Alvaro Hummer, MD;  Location: [facility];  Service: Urology;  Laterality: Left;   HOLMIUM LASER APPLICATION Left 05/10/2018   Procedure: HOLMIUM LASER APPLICATION;  Surgeon: Alvaro Hummer, MD;  Location: [facility];  Service: Urology;  Laterality: Left;   INCISIONAL HERNIA REPAIR N/A 03/19/2019   Procedure: LAPAROSCOPIC INCISIONAL HERNIA REPAIR WITH MESH, RECURRENT UMBILICAL HERNIA REPAIR;  Surgeon: Sheldon Standing, MD;  Location: [facility];  Service: General;  Laterality: N/A;   INGUINAL HERNIA REPAIR Right 03/19/2019   Procedure: LAPAROSCOPIC BILATERAL FEMORAL AND INGUINAL HERNIA REPAIR WITH MESH, LYSIS OF ADHESIONS;  Surgeon: Sheldon Standing, MD;  Location: [facility];  Service: General;  Laterality: Right;   LEFT HEART CATH AND CORONARY ANGIOGRAPHY N/A 03/21/2017   Stents patent; mod circ dz, nothing for intervention--imdur  added.  Procedure: LEFT HEART CATH AND CORONARY ANGIOGRAPHY;  Surgeon: Mady Bruckner, MD;  Location: [facility];  Service: Cardiovascular;  Laterality: N/A;   LUMBAR SPINE SURGERY  1978   L5--S1   LUNG LOBECTOMY Right 1978   right upper lobectomy for blastomycosis    LYMPHADENECTOMY Bilateral 07/14/2015   Procedure: LYMPHADENECTOMY;  Surgeon: Gretel Ferrara, MD;  Location: [facility];  Service: Urology;  Laterality: Bilateral;   ROBOT ASSISTED LAPAROSCOPIC RADICAL PROSTATECTOMY N/A 07/14/2015   One positive pelvic LN.  Procedure: XI ROBOTIC ASSISTED LAPAROSCOPIC RADICAL PROSTATECTOMY LEVEL 2;  Surgeon: Gretel Ferrara, MD;  Location: [facility];  Service: Urology;  Laterality: N/A;   ROTATOR CUFF REPAIR  2007   UMBILICAL HERNIA REPAIR  07/14/2015   Procedure: HERNIA REPAIR UMBILICAL ADULT;  Surgeon: Gretel Ferrara, MD;  Location: [facility];  Service: Urology;;    Outpatient Medications Prior to Visit  Medication Sig Dispense Refill   ALPRAZolam  (XANAX ) 1 MG tablet Take 1 tablet (1 mg total) by mouth daily as needed for severe anxiety. 30 tablet 5   Dextromethorphan -quiNIDine (NUEDEXTA ) 20-10 MG capsule Take 1 capsule by mouth  2 (two) times daily. 60 capsule 11   escitalopram  (LEXAPRO ) 20 MG tablet Take 1 tablet (20 mg total) by mouth daily. 90 tablet 2   meloxicam  (MOBIC ) 15 MG tablet Take 1 tablet (15 mg total) by mouth daily  for achy legs 90 tablet 2   oxyCODONE  (OXY IR/ROXICODONE ) 5 MG immediate release tablet Take 1-2 tablets (5-10 mg total) by mouth 3 (three) times daily as needed. 90 tablet 0   prednisoLONE  acetate (PRED FORTE ) 1 % ophthalmic suspension Place 1 drop into both eyes 3 (three) times daily for 1 week, then as needed. 5 mL 0   sulfamethoxazole -trimethoprim  (BACTRIM  DS) 800-160 MG tablet Take 1 tablet by mouth 2 (two) times daily for 14 days. 28 tablet 0   No facility-administered medications prior to visit.    Allergies  Allergen Reactions   Ace Inhibitors Cough   Imdur  [Isosorbide  Dinitrate] Other (See Comments)    Headache    Review of Systems As per HPI  PE:    12/25/2023    1:00 PM 12/12/2023   10:54 AM 11/06/2023    8:30 AM  Vitals with BMI  Height 5' 9 5' 9   Weight --    Systolic 154  140  Diastolic 92  80  Pulse 68        Physical Exam  General: Alert, no acute distress. Lucid thought.  Speech is very slow and articulation is poor. No further exam today  LABS:  Last CBC Lab Results  Component Value Date   WBC 5.7 09/03/2022   HGB 14.3 09/03/2022   HCT 43.2 09/03/2022   MCV 102.0 (H) 09/03/2022   MCH 35.0 (H) 09/03/2022   RDW 12.8 09/03/2022   PLT 120 (L) 09/03/2022   Last metabolic panel Lab Results  Component Value Date   GLUCOSE 116 (H) 09/03/2022   NA 136 09/03/2022   K 3.9 09/03/2022   CL 99 09/03/2022   CO2 26 09/03/2022   BUN 22 09/03/2022   CREATININE 1.02 09/03/2022   GFRNONAA >60 09/03/2022   CALCIUM  8.9 09/03/2022   PROT 7.2 09/03/2022   ALBUMIN 4.8 01/30/2023   LABGLOB 2.8 09/04/2022   BILITOT 0.7 09/03/2022   ALKPHOS 86 09/03/2022   AST 23 09/03/2022   ALT 26 09/03/2022   ANIONGAP 11 09/03/2022   IMPRESSION AND PLAN:  #1 anxiety and depression. Significantly improved with increasing Lexapro  to 20 mg a day. He does have alprazolam  1 mg to take as needed.   2.  Chronic bilateral leg pain, also osteoarthritis pain in the neck and shoulders. Stable. Continue Voltaren to the back of the neck and shoulders daily, meloxicam  15 mg daily and will continue oxycodone  5 mg tab, 1-2 3 times a day as needed A new prescription was not needed today.   #3 corticobasal syndrome, progressing.  Nothing further neurology can offer. He is completely wheelchair-bound and is gradually losing the ability to speak. He has all necessary home accommodations.   #4  UTI/prostatitis. He is about 90% improved after 2 weeks of Bactrim .  Will extend this 1 more week.  #5 hypertension.  Blood pressure is reasonable. He declines medication  An After Visit Summary was printed and given to the patient.  FOLLOW UP: No follow-ups on file.  Signed:  Gerlene Hockey, MD           12/25/2023

## 2023-12-26 ENCOUNTER — Ambulatory Visit: Admitting: Neurology

## 2023-12-27 ENCOUNTER — Other Ambulatory Visit (HOSPITAL_BASED_OUTPATIENT_CLINIC_OR_DEPARTMENT_OTHER): Payer: Self-pay

## 2023-12-27 ENCOUNTER — Other Ambulatory Visit: Payer: Self-pay

## 2024-01-11 ENCOUNTER — Inpatient Hospital Stay (HOSPITAL_COMMUNITY)

## 2024-01-11 ENCOUNTER — Encounter (HOSPITAL_COMMUNITY): Payer: Self-pay | Admitting: Internal Medicine

## 2024-01-11 ENCOUNTER — Emergency Department (HOSPITAL_COMMUNITY)

## 2024-01-11 ENCOUNTER — Other Ambulatory Visit: Payer: Self-pay

## 2024-01-11 ENCOUNTER — Inpatient Hospital Stay (HOSPITAL_COMMUNITY)
Admission: EM | Admit: 2024-01-11 | Discharge: 2024-01-14 | DRG: 871 | Disposition: A | Discharge status: Hospice | Attending: Internal Medicine | Admitting: Internal Medicine

## 2024-01-11 DIAGNOSIS — R519 Headache, unspecified: Secondary | ICD-10-CM | POA: Diagnosis present

## 2024-01-11 DIAGNOSIS — J9601 Acute respiratory failure with hypoxia: Secondary | ICD-10-CM | POA: Diagnosis not present

## 2024-01-11 DIAGNOSIS — R918 Other nonspecific abnormal finding of lung field: Secondary | ICD-10-CM | POA: Diagnosis present

## 2024-01-11 DIAGNOSIS — Z87891 Personal history of nicotine dependence: Secondary | ICD-10-CM

## 2024-01-11 DIAGNOSIS — I129 Hypertensive chronic kidney disease with stage 1 through stage 4 chronic kidney disease, or unspecified chronic kidney disease: Secondary | ICD-10-CM | POA: Diagnosis present

## 2024-01-11 DIAGNOSIS — R1310 Dysphagia, unspecified: Secondary | ICD-10-CM | POA: Diagnosis present

## 2024-01-11 DIAGNOSIS — Z791 Long term (current) use of non-steroidal anti-inflammatories (NSAID): Secondary | ICD-10-CM

## 2024-01-11 DIAGNOSIS — R6520 Severe sepsis without septic shock: Secondary | ICD-10-CM | POA: Diagnosis not present

## 2024-01-11 DIAGNOSIS — R069 Unspecified abnormalities of breathing: Secondary | ICD-10-CM | POA: Diagnosis not present

## 2024-01-11 DIAGNOSIS — M47812 Spondylosis without myelopathy or radiculopathy, cervical region: Secondary | ICD-10-CM | POA: Diagnosis present

## 2024-01-11 DIAGNOSIS — Z1152 Encounter for screening for COVID-19: Secondary | ICD-10-CM | POA: Diagnosis not present

## 2024-01-11 DIAGNOSIS — Z888 Allergy status to other drugs, medicaments and biological substances status: Secondary | ICD-10-CM

## 2024-01-11 DIAGNOSIS — B449 Aspergillosis, unspecified: Secondary | ICD-10-CM | POA: Diagnosis not present

## 2024-01-11 DIAGNOSIS — N5231 Erectile dysfunction following radical prostatectomy: Secondary | ICD-10-CM | POA: Diagnosis present

## 2024-01-11 DIAGNOSIS — Z9079 Acquired absence of other genital organ(s): Secondary | ICD-10-CM

## 2024-01-11 DIAGNOSIS — G20A1 Parkinson's disease without dyskinesia, without mention of fluctuations: Secondary | ICD-10-CM | POA: Diagnosis present

## 2024-01-11 DIAGNOSIS — Z87442 Personal history of urinary calculi: Secondary | ICD-10-CM

## 2024-01-11 DIAGNOSIS — R1111 Vomiting without nausea: Secondary | ICD-10-CM | POA: Diagnosis not present

## 2024-01-11 DIAGNOSIS — D61818 Other pancytopenia: Secondary | ICD-10-CM | POA: Diagnosis not present

## 2024-01-11 DIAGNOSIS — I69891 Dysphagia following other cerebrovascular disease: Secondary | ICD-10-CM

## 2024-01-11 DIAGNOSIS — Z79899 Other long term (current) drug therapy: Secondary | ICD-10-CM

## 2024-01-11 DIAGNOSIS — J189 Pneumonia, unspecified organism: Secondary | ICD-10-CM | POA: Diagnosis not present

## 2024-01-11 DIAGNOSIS — Z8619 Personal history of other infectious and parasitic diseases: Secondary | ICD-10-CM

## 2024-01-11 DIAGNOSIS — R230 Cyanosis: Secondary | ICD-10-CM | POA: Diagnosis not present

## 2024-01-11 DIAGNOSIS — A419 Sepsis, unspecified organism: Principal | ICD-10-CM | POA: Diagnosis present

## 2024-01-11 DIAGNOSIS — J690 Pneumonitis due to inhalation of food and vomit: Secondary | ICD-10-CM | POA: Diagnosis not present

## 2024-01-11 DIAGNOSIS — N179 Acute kidney failure, unspecified: Secondary | ICD-10-CM | POA: Diagnosis not present

## 2024-01-11 DIAGNOSIS — I700 Atherosclerosis of aorta: Secondary | ICD-10-CM | POA: Diagnosis not present

## 2024-01-11 DIAGNOSIS — R0603 Acute respiratory distress: Secondary | ICD-10-CM | POA: Diagnosis not present

## 2024-01-11 DIAGNOSIS — D696 Thrombocytopenia, unspecified: Secondary | ICD-10-CM | POA: Diagnosis present

## 2024-01-11 DIAGNOSIS — Z8719 Personal history of other diseases of the digestive system: Secondary | ICD-10-CM

## 2024-01-11 DIAGNOSIS — I69822 Dysarthria following other cerebrovascular disease: Secondary | ICD-10-CM | POA: Diagnosis not present

## 2024-01-11 DIAGNOSIS — E785 Hyperlipidemia, unspecified: Secondary | ICD-10-CM | POA: Diagnosis present

## 2024-01-11 DIAGNOSIS — I2510 Atherosclerotic heart disease of native coronary artery without angina pectoris: Secondary | ICD-10-CM | POA: Diagnosis present

## 2024-01-11 DIAGNOSIS — G8929 Other chronic pain: Secondary | ICD-10-CM | POA: Diagnosis present

## 2024-01-11 DIAGNOSIS — R0602 Shortness of breath: Secondary | ICD-10-CM | POA: Diagnosis not present

## 2024-01-11 DIAGNOSIS — Z66 Do not resuscitate: Secondary | ICD-10-CM | POA: Diagnosis present

## 2024-01-11 DIAGNOSIS — G3185 Corticobasal degeneration: Secondary | ICD-10-CM | POA: Diagnosis not present

## 2024-01-11 DIAGNOSIS — Z981 Arthrodesis status: Secondary | ICD-10-CM

## 2024-01-11 DIAGNOSIS — N182 Chronic kidney disease, stage 2 (mild): Secondary | ICD-10-CM | POA: Diagnosis present

## 2024-01-11 DIAGNOSIS — Z9049 Acquired absence of other specified parts of digestive tract: Secondary | ICD-10-CM

## 2024-01-11 DIAGNOSIS — K7581 Nonalcoholic steatohepatitis (NASH): Secondary | ICD-10-CM | POA: Diagnosis present

## 2024-01-11 DIAGNOSIS — Z974 Presence of external hearing-aid: Secondary | ICD-10-CM

## 2024-01-11 DIAGNOSIS — Z515 Encounter for palliative care: Secondary | ICD-10-CM

## 2024-01-11 DIAGNOSIS — J9691 Respiratory failure, unspecified with hypoxia: Secondary | ICD-10-CM | POA: Diagnosis not present

## 2024-01-11 DIAGNOSIS — Z902 Acquired absence of lung [part of]: Secondary | ICD-10-CM

## 2024-01-11 DIAGNOSIS — R000 Tachycardia, unspecified: Secondary | ICD-10-CM | POA: Diagnosis not present

## 2024-01-11 DIAGNOSIS — M51369 Other intervertebral disc degeneration, lumbar region without mention of lumbar back pain or lower extremity pain: Secondary | ICD-10-CM | POA: Diagnosis present

## 2024-01-11 DIAGNOSIS — D72819 Decreased white blood cell count, unspecified: Secondary | ICD-10-CM

## 2024-01-11 DIAGNOSIS — Z9889 Other specified postprocedural states: Secondary | ICD-10-CM

## 2024-01-11 DIAGNOSIS — Z955 Presence of coronary angioplasty implant and graft: Secondary | ICD-10-CM

## 2024-01-11 DIAGNOSIS — R739 Hyperglycemia, unspecified: Secondary | ICD-10-CM

## 2024-01-11 DIAGNOSIS — Z8379 Family history of other diseases of the digestive system: Secondary | ICD-10-CM

## 2024-01-11 DIAGNOSIS — Z8049 Family history of malignant neoplasm of other genital organs: Secondary | ICD-10-CM

## 2024-01-11 DIAGNOSIS — F339 Major depressive disorder, recurrent, unspecified: Secondary | ICD-10-CM | POA: Diagnosis present

## 2024-01-11 DIAGNOSIS — R351 Nocturia: Secondary | ICD-10-CM | POA: Diagnosis present

## 2024-01-11 DIAGNOSIS — I10 Essential (primary) hypertension: Secondary | ICD-10-CM | POA: Diagnosis not present

## 2024-01-11 DIAGNOSIS — J949 Pleural condition, unspecified: Secondary | ICD-10-CM | POA: Diagnosis not present

## 2024-01-11 DIAGNOSIS — Z8546 Personal history of malignant neoplasm of prostate: Secondary | ICD-10-CM

## 2024-01-11 DIAGNOSIS — M4302 Spondylolysis, cervical region: Secondary | ICD-10-CM | POA: Diagnosis present

## 2024-01-11 LAB — URINALYSIS, W/ REFLEX TO CULTURE (INFECTION SUSPECTED)
Bilirubin Urine: NEGATIVE
Glucose, UA: NEGATIVE mg/dL
Hgb urine dipstick: NEGATIVE
Ketones, ur: NEGATIVE mg/dL
Leukocytes,Ua: NEGATIVE
Nitrite: NEGATIVE
Protein, ur: NEGATIVE mg/dL
Specific Gravity, Urine: 1.02 (ref 1.005–1.030)
pH: 5 (ref 5.0–8.0)

## 2024-01-11 LAB — CBC
HCT: 34.4 % — ABNORMAL LOW (ref 39.0–52.0)
Hemoglobin: 11.5 g/dL — ABNORMAL LOW (ref 13.0–17.0)
MCH: 35.9 pg — ABNORMAL HIGH (ref 26.0–34.0)
MCHC: 33.4 g/dL (ref 30.0–36.0)
MCV: 107.5 fL — ABNORMAL HIGH (ref 80.0–100.0)
Platelets: 90 K/uL — ABNORMAL LOW (ref 150–400)
RBC: 3.2 MIL/uL — ABNORMAL LOW (ref 4.22–5.81)
RDW: 13.5 % (ref 11.5–15.5)
WBC: 0.8 K/uL — CL (ref 4.0–10.5)
nRBC: 0 % (ref 0.0–0.2)

## 2024-01-11 LAB — COMPREHENSIVE METABOLIC PANEL WITH GFR
ALT: 49 U/L — ABNORMAL HIGH (ref 0–44)
AST: 40 U/L (ref 15–41)
Albumin: 4.2 g/dL (ref 3.5–5.0)
Alkaline Phosphatase: 53 U/L (ref 38–126)
Anion gap: 13 (ref 5–15)
BUN: 16 mg/dL (ref 8–23)
CO2: 24 mmol/L (ref 22–32)
Calcium: 9 mg/dL (ref 8.9–10.3)
Chloride: 100 mmol/L (ref 98–111)
Creatinine, Ser: 1.47 mg/dL — ABNORMAL HIGH (ref 0.61–1.24)
GFR, Estimated: 47 mL/min — ABNORMAL LOW (ref >60)
Glucose, Bld: 145 mg/dL — ABNORMAL HIGH (ref 70–99)
Potassium: 4.6 mmol/L (ref 3.5–5.1)
Sodium: 137 mmol/L (ref 135–145)
Total Bilirubin: 0.9 mg/dL (ref 0.0–1.2)
Total Protein: 6.9 g/dL (ref 6.5–8.1)

## 2024-01-11 LAB — CREATININE, SERUM
Creatinine, Ser: 1.5 mg/dL — ABNORMAL HIGH (ref 0.61–1.24)
GFR, Estimated: 46 mL/min — ABNORMAL LOW (ref >60)

## 2024-01-11 LAB — CBC WITH DIFFERENTIAL/PLATELET
Basophils Absolute: 0 K/uL (ref 0.0–0.1)
Basophils Relative: 0 %
Eosinophils Absolute: 0 K/uL (ref 0.0–0.5)
Eosinophils Relative: 1 %
HCT: 38.5 % — ABNORMAL LOW (ref 39.0–52.0)
Hemoglobin: 12.9 g/dL — ABNORMAL LOW (ref 13.0–17.0)
Lymphocytes Relative: 34 %
Lymphs Abs: 0.5 K/uL — ABNORMAL LOW (ref 0.7–4.0)
MCH: 35.8 pg — ABNORMAL HIGH (ref 26.0–34.0)
MCHC: 33.5 g/dL (ref 30.0–36.0)
MCV: 106.9 fL — ABNORMAL HIGH (ref 80.0–100.0)
Monocytes Absolute: 0.1 K/uL (ref 0.1–1.0)
Monocytes Relative: 5 %
Neutro Abs: 1 K/uL — ABNORMAL LOW (ref 1.7–7.7)
Neutrophils Relative %: 60 %
Platelets: 112 K/uL — ABNORMAL LOW (ref 150–400)
RBC: 3.6 MIL/uL — ABNORMAL LOW (ref 4.22–5.81)
RDW: 13.2 % (ref 11.5–15.5)
WBC: 1.6 K/uL — ABNORMAL LOW (ref 4.0–10.5)
nRBC: 0 % (ref 0.0–0.2)

## 2024-01-11 LAB — I-STAT CG4 LACTIC ACID, ED
Lactic Acid, Venous: 4.2 mmol/L (ref 0.5–1.9)
Lactic Acid, Venous: 7.5 mmol/L (ref 0.5–1.9)

## 2024-01-11 LAB — PROTIME-INR
INR: 1 (ref 0.8–1.2)
Prothrombin Time: 14.2 s (ref 11.4–15.2)

## 2024-01-11 MED ORDER — SODIUM CHLORIDE 0.9 % IV SOLN
1.0000 g | INTRAVENOUS | Status: DC
  Administered 2024-01-11: 1 g via INTRAVENOUS
  Filled 2024-01-11: qty 10

## 2024-01-11 MED ORDER — HEPARIN SODIUM (PORCINE) 5000 UNIT/ML IJ SOLN
5000.0000 [IU] | Freq: Three times a day (TID) | INTRAMUSCULAR | Status: DC
  Administered 2024-01-12 – 2024-01-13 (×3): 5000 [IU] via SUBCUTANEOUS
  Filled 2024-01-11 (×4): qty 1

## 2024-01-11 MED ORDER — ONDANSETRON HCL 4 MG/2ML IJ SOLN: 4.0000 mg | Freq: Four times a day (QID) | INTRAMUSCULAR | Status: DC | PRN

## 2024-01-11 MED ORDER — LACTATED RINGERS IV SOLN: INTRAVENOUS | Status: AC

## 2024-01-11 MED ORDER — ACETAMINOPHEN 650 MG RE SUPP
650.0000 mg | Freq: Once | RECTAL | Status: AC
  Administered 2024-01-11: 650 mg via RECTAL
  Filled 2024-01-11: qty 1

## 2024-01-11 MED ORDER — LACTATED RINGERS IV BOLUS (SEPSIS)
250.0000 mL | Freq: Once | INTRAVENOUS | Status: AC
  Administered 2024-01-11: 250 mL via INTRAVENOUS

## 2024-01-11 MED ORDER — SODIUM CHLORIDE 0.9 % IV SOLN
2.0000 g | Freq: Once | INTRAVENOUS | Status: AC
  Administered 2024-01-11: 2 g via INTRAVENOUS
  Filled 2024-01-11: qty 20

## 2024-01-11 MED ORDER — HALOPERIDOL LACTATE 5 MG/ML IJ SOLN: 2.5000 mg | INTRAMUSCULAR | Status: DC | PRN

## 2024-01-11 MED ORDER — GLYCOPYRROLATE 0.2 MG/ML IJ SOLN: 0.2000 mg | INTRAMUSCULAR | Status: DC | PRN

## 2024-01-11 MED ORDER — POLYVINYL ALCOHOL 1.4 % OP SOLN: 1.0000 [drp] | Freq: Four times a day (QID) | OPHTHALMIC | Status: DC | PRN

## 2024-01-11 MED ORDER — SODIUM CHLORIDE 0.9 % IV SOLN
500.0000 mg | INTRAVENOUS | Status: DC
  Administered 2024-01-11 – 2024-01-13 (×3): 500 mg via INTRAVENOUS
  Filled 2024-01-11 (×3): qty 5

## 2024-01-11 MED ORDER — ACETAMINOPHEN 325 MG PO TABS
650.0000 mg | ORAL_TABLET | Freq: Four times a day (QID) | ORAL | Status: DC | PRN
  Administered 2024-01-12: 650 mg via ORAL
  Filled 2024-01-11 (×2): qty 2

## 2024-01-11 MED ORDER — LACTATED RINGERS IV BOLUS (SEPSIS)
1000.0000 mL | Freq: Once | INTRAVENOUS | Status: AC
  Administered 2024-01-11: 1000 mL via INTRAVENOUS

## 2024-01-11 MED ORDER — MORPHINE SULFATE (PF) 2 MG/ML IV SOLN
2.0000 mg | INTRAVENOUS | Status: DC | PRN
  Administered 2024-01-11: 4 mg via INTRAVENOUS
  Administered 2024-01-11: 2 mg via INTRAVENOUS
  Administered 2024-01-11 – 2024-01-12 (×3): 4 mg via INTRAVENOUS
  Filled 2024-01-11 (×3): qty 2
  Filled 2024-01-11: qty 1
  Filled 2024-01-11 (×2): qty 2

## 2024-01-11 MED ORDER — MORPHINE SULFATE (PF) 4 MG/ML IV SOLN
4.0000 mg | Freq: Once | INTRAVENOUS | Status: AC
  Administered 2024-01-11: 4 mg via INTRAVENOUS
  Filled 2024-01-11: qty 1

## 2024-01-11 MED ORDER — GLYCOPYRROLATE 1 MG PO TABS: 1.0000 mg | ORAL_TABLET | ORAL | Status: DC | PRN

## 2024-01-11 MED ORDER — ONDANSETRON 4 MG PO TBDP: 4.0000 mg | ORAL_TABLET | Freq: Four times a day (QID) | ORAL | Status: DC | PRN

## 2024-01-11 MED ORDER — ESCITALOPRAM OXALATE 20 MG PO TABS
20.0000 mg | ORAL_TABLET | Freq: Every day | ORAL | Status: DC
  Administered 2024-01-12 – 2024-01-14 (×3): 20 mg via ORAL
  Filled 2024-01-11 (×3): qty 1

## 2024-01-11 MED ORDER — ACETAMINOPHEN 325 MG PO TABS
650.0000 mg | ORAL_TABLET | Freq: Once | ORAL | Status: DC
  Filled 2024-01-11: qty 2

## 2024-01-11 MED ORDER — SODIUM CHLORIDE 0.9 % IV SOLN: INTRAVENOUS | Status: DC

## 2024-01-11 MED ORDER — ACETAMINOPHEN 650 MG RE SUPP: 650.0000 mg | Freq: Four times a day (QID) | RECTAL | Status: DC | PRN

## 2024-01-11 MED ORDER — IPRATROPIUM-ALBUTEROL 0.5-2.5 (3) MG/3ML IN SOLN
RESPIRATORY_TRACT | Status: AC
  Filled 2024-01-11: qty 3

## 2024-01-11 MED ORDER — IPRATROPIUM-ALBUTEROL 0.5-2.5 (3) MG/3ML IN SOLN
3.0000 mL | RESPIRATORY_TRACT | Status: DC | PRN
  Administered 2024-01-11 – 2024-01-14 (×10): 3 mL via RESPIRATORY_TRACT
  Filled 2024-01-11 (×10): qty 3

## 2024-01-11 NOTE — ED Provider Notes (Signed)
 [place] EMERGENCY DEPARTMENT AT [facility] Provider Note   CSN: [id number] Arrival date & time: 01/11/24  0150     Patient presents with: Respiratory Distress   William Montgomery is a 83 y.o. male.   The history is provided by the spouse and the EMS personnel. The history is limited by the condition of the patient (Severe respiratory distress).   He has history of hypertension, hyperlipidemia, coronary artery disease, chronic kidney disease, prostate cancer, NASH, Parkinson's disease and comes in following an episode of aspiration at home.  His wife states that he was generally not feeling well today but vomited and aspirated and was in respiratory distress.  He is DNR and did not want to come to the hospital, but she did not feel comfortable managing him at home.  EMS noted initial oxygen saturation of 60% which did not improve with nonrebreather mask.  They placed him on a CPAP mask and oxygen saturation improved to 84%.  Wife reiterates that he does not want to be intubated.    Prior to Admission medications   Medication Sig Start Date End Date Taking? Authorizing Provider  ALPRAZolam  (XANAX ) 1 MG tablet Take 1 tablet (1 mg total) by mouth daily as needed for severe anxiety. 07/23/23   McGowen, Aleene DEL, MD  Dextromethorphan -quiNIDine (NUEDEXTA ) 20-10 MG capsule Take 1 capsule by mouth 2 (two) times daily. 07/09/23   Leigh Venetia CROME, MD  escitalopram  (LEXAPRO ) 20 MG tablet Take 1 tablet (20 mg total) by mouth daily. 11/06/23   McGowen, Aleene DEL, MD  meloxicam  (MOBIC ) 15 MG tablet Take 1 tablet (15 mg total) by mouth daily  for achy legs 11/06/23   McGowen, Aleene DEL, MD  oxyCODONE  (OXY IR/ROXICODONE ) 5 MG immediate release tablet Take 1-2 tablets (5-10 mg total) by mouth 3 (three) times daily as needed. 12/25/23   McGowen, Aleene DEL, MD  prednisoLONE  acetate (PRED FORTE ) 1 % ophthalmic suspension Place 1 drop into both eyes 3 (three) times daily for 1 week, then as needed. 10/23/23        Allergies: Ace inhibitors and Imdur  [isosorbide  dinitrate]    Review of Systems  Unable to perform ROS: Severe respiratory distress    Updated Vital Signs BP 118/74   Pulse (!) 117   Temp (!) 100.6 F (38.1 C) (Axillary)   Resp (!) 21   Ht 5' 9 (1.753 m)   Wt 73.9 kg   SpO2 90%   BMI 24.06 kg/m   Physical Exam Vitals and nursing note reviewed.   83 year old male, resting comfortably and in no acute distress. Vital signs are significant for elevated temperature, pulse rate, respiratory rate. Oxygen saturation is 84%, which is hypoxic.  He was switched over to BiPAP, and oxygen saturation has improved to 90% which is borderline low. Head is normocephalic and atraumatic. PERRLA, EOMI.  Neck is nontender and supple. Lungs have coarse expiratory rhonchi diffusely, and no rales or wheezes. Chest is nontender. Heart has regular rate and rhythm without murmur. Abdomen is soft, flat, nontender. Extremities trace edema. Skin is warm and dry without rash. Neurologic: Awake and alert, moves all extremities equally.  Answers questions appropriately.  (all labs ordered are listed, but only abnormal results are displayed) Labs Reviewed  COMPREHENSIVE METABOLIC PANEL WITH GFR - Abnormal; Notable for the following components:      Result Value   Glucose, Bld 145 (*)    Creatinine, Ser 1.47 (*)    ALT 49 (*)  GFR, Estimated 47 (*)    All other components within normal limits  CBC WITH DIFFERENTIAL/PLATELET - Abnormal; Notable for the following components:   WBC 1.6 (*)    RBC 3.60 (*)    Hemoglobin 12.9 (*)    HCT 38.5 (*)    MCV 106.9 (*)    MCH 35.8 (*)    Platelets 112 (*)    Neutro Abs 1.0 (*)    Lymphs Abs 0.5 (*)    All other components within normal limits  I-STAT CG4 LACTIC ACID, ED - Abnormal; Notable for the following components:   Lactic Acid, Venous 4.2 (*)    All other components within normal limits  CULTURE, BLOOD (ROUTINE X 2)  CULTURE, BLOOD (ROUTINE X  2)  PROTIME-INR  URINALYSIS, W/ REFLEX TO CULTURE (INFECTION SUSPECTED)    EKG: EKG Interpretation Date/Time:  Saturday January 11 2024 02:08:46 EDT Ventricular Rate:  109 PR Interval:  161 QRS Duration:  84 QT Interval:  304 QTC Calculation: 410 R Axis:   54  Text Interpretation: Sinus tachycardia Abnormal R-wave progression, early transition Left ventricular hypertrophy Nonspecific T wave abnormality When compared with ECG of 12/14/2022, Left ventricular hypertrophy is now present HEART RATE has increased Nonspecific T wave abnormality is now present Confirmed by Raford Lenis ([id number]) on 01/11/2024 2:12:42 AM  Radiology: [facility] Chest Port 1 View Result Date: 01/11/2024 EXAM: 1 VIEW(S) XRAY OF THE CHEST 01/11/2024 02:16:00 AM COMPARISON: Comparison with chest radiograph 05/30/2017. CLINICAL HISTORY: Questionable sepsis - evaluate for abnormality. Respiratory distress. FINDINGS: LUNGS AND PLEURA: The lungs are clear. No pleural effusion. No pneumothorax. HEART AND MEDIASTINUM: Stable cardiomediastinal silhouette. BONES AND SOFT TISSUES: Postsurgical changes about the right hilum. Surgical clips project over the left upper chest. IMPRESSION: 1. No acute cardiopulmonary process. Electronically signed by: Norman Gatlin MD 01/11/2024 02:43 AM EDT RP Workstation: [id number]     Procedures  Cardiac monitor shows sinus tachycardia, per my interpretation. Medications Ordered in the ED  cefTRIAXone  (ROCEPHIN ) 2 g in sodium chloride  0.9 % 100 mL IVPB (has no administration in time range)                                    Medical Decision Making Amount and/or Complexity of Data Reviewed Labs: ordered. Radiology: ordered.  Risk OTC drugs. Prescription drug management. Decision regarding hospitalization.   Hypoxia and respiratory distress following episode of aspiration and patient to his DNR and does not wish to be intubated.  He is doing better following being placed on CPAP.  After  transition to BiPAP, oxygen saturation improved.  He is febrile now, I have initiated the evolving sepsis pathway and I have ordered IV antibiotics.  I confirmed with patient and wife that they were amenable to hospital admission for short-term respiratory support and antibiotics.  Chest x-ray shows no acute cardiopulmonary process.  I have independently viewed the image, and agree with the radiologist's interpretation.  However, x-ray findings of aspiration can be delayed, I still feel that aspiration pneumonia is the cause of his fever.  I have reviewed his laboratory tests, and my interpretation is elevated lactic acid level consistent with sepsis and I have been ordered early goal-directed fluids for sepsis.  Metabolic panel is significant for elevated random glucose level, acute kidney injury with creatinine 1.47 compared with baseline right around 1.0, mild elevation of ALT which is not felt to be clinically significant, borderline anemia  which is new, thrombocytopenia not significantly changed from baseline, moderately severe leukopenia but with adequate absolute neutrophil count.  Leukopenia is more severe than prior values and is concerning for sepsis.  I have explained all of this to the patient's wife who states that she now would prefer that BiPAP be taken off and he be maintained on nasal oxygen.  I have ordered this.  He will need to be admitted.  I have discussed case with Dr. Dorinda of [facility], who agrees to admit the patient.  Off BiPAP, oxygen saturation has dropped to 80%.  He is in mild respiratory distress but still awake and alert.  Wife is concerned that he is getting anxious regarding his difficulty breathing.  I have ordered a dose of intravenous morphine .  Also, lactic acid level has increased in spite of IV fluids.  At this point, I believe some of his lactate is related to hypoxia as opposed to ongoing sepsis.  In any case, he is comfort care and I have elected not to give  him additional fluids.  I have discussed the case with Dr. Lucie of [facility], who agrees to admit the patient.  During the night, patient developed some mild to moderate respiratory distress, given a dose of morphine  with clinical improvement.  CRITICAL CARE Performed by: Alm Lias Total critical care time: 60 minutes Critical care time was exclusive of separately billable procedures and treating other patients. Critical care was necessary to treat or prevent imminent or life-threatening deterioration. Critical care was time spent personally by me on the following activities: development of treatment plan with patient and/or surrogate as well as nursing, discussions with consultants, evaluation of patient's response to treatment, examination of patient, obtaining history from patient or surrogate, ordering and performing treatments and interventions, ordering and review of laboratory studies, ordering and review of radiographic studies, pulse oximetry and re-evaluation of patient's condition.     Final diagnoses:  Sepsis due to undetermined organism (HCC)  Aspergillus pneumonia (HCC)  Acute respiratory failure with hypoxemia (HCC)  Leukopenia, unspecified type  Acute kidney injury (nontraumatic) (HCC)  Elevated random blood glucose level    ED Discharge Orders     None          Lias Alm, MD 01/11/24 [phone]

## 2024-01-11 NOTE — ED Notes (Signed)
 The wife states the patient would not want to be wearing BIPAP. Dr. Raford at bedside speaking with wife regarding their wishes regarding BIPAP.

## 2024-01-11 NOTE — ED Triage Notes (Signed)
 PT BIB [facility] from home due to respiratory distress on CPAP.  Per wife emesis episode approx. 6pm on 9/5. Satting around 75%. EMS was called but patient denied to come to hospital. EMS called again and was saturating in 60s. With audible rhonchi at EMS arrival. Patient alert but very lethargic at arrival.   EMS gave: 7.5mg  albuterol  0.5mg  of Atrovent   20g L hand CPAP placed by EMS  DNR at bedside

## 2024-01-11 NOTE — ED Notes (Signed)
 Dr. Raford made aware of patients lactic acid of 4.2 via epic secure chat with acknowledgement. No new orders received.

## 2024-01-11 NOTE — ED Notes (Signed)
 Spouse stated that she does not want NRB mask for patient , prefers O2 [place] high flow and comfort measures . EDP notified .

## 2024-01-11 NOTE — Care Plan (Signed)
 Patient arrived to the unit AAOX 4 on 10L [place] satting 85% / call placed to RT. Skin assessment done with Rocky, RN.  Wife Erminio bedside  [place], [place]

## 2024-01-11 NOTE — H&P (Addendum)
 History and Physical    Patient: William Montgomery FMW:[id number] DOB: 12-Jul-1940 DOA: 01/11/2024 DOS: the patient was seen and examined on 01/11/2024 PCP: Candise Aleene DEL, MD  Patient coming from: Home  Chief Complaint:  Chief Complaint  Patient presents with   Respiratory Distress   HPI: William Montgomery is a 83 y.o. male with medical history significant of CAD s/p stenting of LAD on multiple occasions (pt not on aspirin  and statin by choice), prostate cancer s/p radical prostatectomy in 2017, NASH, HTN, remote C6-C7 fusion and chronic pain p/w fever and AHRF 2/2 aspiration.   The patient presented with respiratory symptoms characterized by a raspy sound, which began following an episode of vomiting in bed the previous afternoon. The patient had been feeling unwell and returned to bed around 11 o'clock in the morning, with vomiting occurring at approximately 3 PM. Since then, the patient experienced a rattling sensation when breathing. When his breathing worsening, the pt presented to the ED for evaluation at the behest of his wife.  In the ED, pt febrile 100.6 x1, tachycardic, and tachypneic with hypoxia (required BiPAP in ED and now on HFNC). Labs showed Cr 1.5 (baseline 1), WBC 0.8, and lactic acid 4.2-->7.5. CXR w/o focal consolidation. EDP treated for CAP/aspiration given HPI and requested medicine admission.  Review of Systems: As mentioned in the history of present illness. All other systems reviewed and are negative. Past Medical History:  Diagnosis Date   Arthritis    joints/  back/  neck   Chronic headaches    due to cervical spondylosis   Chronic renal insufficiency, stage 2 (mild)    borderlines II/III   Coronary artery disease 1999   cardiologist--- dr lavona---  (per cardiology note pt refused to take asa/ statin) hx PCI w/ stenting in 1999 to LAD and 12-23-2007 x2 DES to proximal LAD;  Cath 03/2017-->  non- critical 70 - 80% mid/distal circ stenosis,  chronic small D1 80%  stenosis (stable) and DES to proximal and mid LAD--   Med mgmt rec'd-->nitrates caused HAs, ACE-I caused cough.   Corticobasal syndrome (HCC)    [facility] 04/2023   DDD (degenerative disc disease)    lumbar and cervical neck   Diverticulosis of sigmoid colon 07/25/2011   Severe (endoscopy by Dr. Jakie)   Erectile dysfunction after radical prostatectomy    History of kidney stones    History of prostate cancer 12/2014   urologist-- dr matilda---   s/p Robot assisted radical prostatectomy 07/2015: lymph node involvement//   undetactable PSA since   HTN (hypertension)    ACE-I cough.  Fine on ARB.   Hx of blastomycosis 1978   s/p  right upper lobectomy   Hyperlipidemia    NASH (nonalcoholic steatohepatitis) 06/2011   normal LFTS  /  Hep B and C testing neg.  Mild fatty liver on CT 2020   Nocturia    Parkinsonism (HCC)    2023/24, unknown etiology (Dat scan neg), peripheral nerve bx, and spinal fluid testing all NEG/Norma. [facility] 2nd opinion-->corticobasal syndrome 04/2023)   Pseudobulbar affect    Pulmonary nodules    small, picked up on CT ab/pelv for abd pains/flank pains-->pt quit smoking 40 yrs ago.  Low risk for lung ca so no f/u CT needed.   Recurrent depression (HCC)    S/P drug eluting coronary stent placement 1999   x2 LAD in 1999;  and 08/ 2009;;   x2 LAD proximal and mid in 03-21-2017   Spondylolysis  of cervical spine    severe w/ cervicalgia   Tinnitus    Wears glasses    Wears hearing aid in both ears    Past Surgical History:  Procedure Laterality Date   ANTERIOR CERVICAL DECOMP/DISCECTOMY FUSION  1982   C6--7   APPENDECTOMY  1953   CARDIAC CATHETERIZATION  02/27/2000   @[facility]  by dr hochrein ---  single vessel w/ preserved LVSF   CORONARY ANGIOPLASTY WITH STENT PLACEMENT  12/23/2007   by Dr End--- PCI w/ DES x2 to  pLAD   CORONARY STENT PLACEMENT  1999   PTCA w/ stenting to LAd   CYSTOSCOPY WITH RETROGRADE PYELOGRAM, URETEROSCOPY AND STENT PLACEMENT Left  05/10/2018   Procedure: CYSTOSCOPY WITH LEFT RETROGRADE PYELOGRAM, LEFT URETEROSCOPY AND LEFT URETERAL STENT PLACEMENT;  Surgeon: Alvaro Hummer, MD;  Location: [facility];  Service: Urology;  Laterality: Left;   HOLMIUM LASER APPLICATION Left 05/10/2018   Procedure: HOLMIUM LASER APPLICATION;  Surgeon: Alvaro Hummer, MD;  Location: [facility];  Service: Urology;  Laterality: Left;   INCISIONAL HERNIA REPAIR N/A 03/19/2019   Procedure: LAPAROSCOPIC INCISIONAL HERNIA REPAIR WITH MESH, RECURRENT UMBILICAL HERNIA REPAIR;  Surgeon: Sheldon Standing, MD;  Location: [facility];  Service: General;  Laterality: N/A;   INGUINAL HERNIA REPAIR Right 03/19/2019   Procedure: LAPAROSCOPIC BILATERAL FEMORAL AND INGUINAL HERNIA REPAIR WITH MESH, LYSIS OF ADHESIONS;  Surgeon: Sheldon Standing, MD;  Location: [facility];  Service: General;  Laterality: Right;   LEFT HEART CATH AND CORONARY ANGIOGRAPHY N/A 03/21/2017   Stents patent; mod circ dz, nothing for intervention--imdur  added.  Procedure: LEFT HEART CATH AND CORONARY ANGIOGRAPHY;  Surgeon: Mady Bruckner, MD;  Location: [facility];  Service: Cardiovascular;  Laterality: N/A;   LUMBAR SPINE SURGERY  1978   L5--S1   LUNG LOBECTOMY Right 1978   right upper lobectomy for blastomycosis   LYMPHADENECTOMY Bilateral 07/14/2015   Procedure: LYMPHADENECTOMY;  Surgeon: Gretel Ferrara, MD;  Location: [facility];  Service: Urology;  Laterality: Bilateral;   ROBOT ASSISTED LAPAROSCOPIC RADICAL PROSTATECTOMY N/A 07/14/2015   One positive pelvic LN.  Procedure: XI ROBOTIC ASSISTED LAPAROSCOPIC RADICAL PROSTATECTOMY LEVEL 2;  Surgeon: Gretel Ferrara, MD;  Location: [facility];  Service: Urology;  Laterality: N/A;   ROTATOR CUFF REPAIR  2007   UMBILICAL HERNIA REPAIR  07/14/2015   Procedure: HERNIA REPAIR UMBILICAL ADULT;  Surgeon: Gretel Ferrara, MD;  Location: [facility];  Service: Urology;;   Social History:  reports that he quit smoking about 47 years ago. His smoking use included cigarettes.  He started smoking about 67 years ago. He has a 10 pack-year smoking history. He quit smokeless tobacco use about 25 years ago.  His smokeless tobacco use included chew. He reports current alcohol  use. He reports that he does not use drugs.  Allergies  Allergen Reactions   Ace Inhibitors Cough   Imdur  [Isosorbide  Dinitrate] Other (See Comments)    Headache    Family History  Problem Relation Age of Onset   Cervical cancer Mother        deceased   Cirrhosis Father    Colon cancer Neg Hx     Prior to Admission medications   Medication Sig Start Date End Date Taking? Authorizing Provider  acetaminophen  (TYLENOL ) 650 MG CR tablet Take 1,300 mg by mouth in the morning, at noon, and at bedtime.   Yes [provider]  ALPRAZolam  (XANAX ) 1 MG tablet Take 1 tablet (1 mg total) by mouth daily as needed for severe anxiety.  Patient taking differently: Take 1 mg by mouth daily as needed for anxiety. 07/23/23  Yes McGowen, Aleene DEL, MD  Carboxymethylcellulose Sodium (THERATEARS) 0.25 % SOLN Place 2 drops into both eyes as needed (Dry eyes).   Yes [provider]  Cholecalciferol  50 MCG (2000 UT) CAPS Take 2,000 Units by mouth daily with breakfast.   Yes [provider]  Dextromethorphan -quiNIDine (NUEDEXTA ) 20-10 MG capsule Take 1 capsule by mouth 2 (two) times daily. 07/09/23  Yes Leigh Venetia CROME, MD  escitalopram  (LEXAPRO ) 20 MG tablet Take 1 tablet (20 mg total) by mouth daily. 11/06/23  Yes McGowen, Aleene DEL, MD  lidocaine  4 % Place 3 patches onto the skin daily as needed (arthritic pain in shoulders, neck.).   Yes [provider]  meloxicam  (MOBIC ) 15 MG tablet Take 1 tablet (15 mg total) by mouth daily  for achy legs 11/06/23  Yes McGowen, Aleene DEL, MD  oxyCODONE  (OXY IR/ROXICODONE ) 5 MG immediate release tablet Take 1-2 tablets (5-10 mg total) by mouth 3 (three) times daily as needed. 12/25/23  Yes Candise Aleene DEL, MD    Physical Exam: Vitals:   01/11/24 0545  01/11/24 0555 01/11/24 0600 01/11/24 0700  BP: 125/69  127/79 129/73  Pulse: 92  93 88  Resp: (!) 22  (!) 21 20  Temp:  98.7 F (37.1 C)    TempSrc:  Temporal    SpO2: (!) 84%  (!) 83% (!) 85%  Weight:      Height:       General: Alert, oriented x3, resting comfortably in no acute distress Respiratory: Lungs clear to auscultation bilaterally with normal respiratory effort; no w/r/r Cardiovascular: Regular rate and rhythm w/o m/r/g   Data Reviewed:  Lab Results  Component Value Date   WBC 0.8 (LL) 01/11/2024   HGB 11.5 (L) 01/11/2024   HCT 34.4 (L) 01/11/2024   MCV 107.5 (H) 01/11/2024   PLT 90 (L) 01/11/2024   Lab Results  Component Value Date   GLUCOSE 145 (H) 01/11/2024   CALCIUM  9.0 01/11/2024   NA 137 01/11/2024   K 4.6 01/11/2024   CO2 24 01/11/2024   CL 100 01/11/2024   BUN 16 01/11/2024   CREATININE 1.50 (H) 01/11/2024   Lab Results  Component Value Date   ALT 49 (H) 01/11/2024   AST 40 01/11/2024   ALKPHOS 53 01/11/2024   BILITOT 0.9 01/11/2024   Lab Results  Component Value Date   INR 1.0 01/11/2024   INR 1.1 03/15/2017   Radiology: DG Chest Port 1 View Result Date: 01/11/2024 EXAM: 1 VIEW(S) XRAY OF THE CHEST 01/11/2024 02:16:00 AM COMPARISON: Comparison with chest radiograph 05/30/2017. CLINICAL HISTORY: Questionable sepsis - evaluate for abnormality. Respiratory distress. FINDINGS: LUNGS AND PLEURA: The lungs are clear. No pleural effusion. No pneumothorax. HEART AND MEDIASTINUM: Stable cardiomediastinal silhouette. BONES AND SOFT TISSUES: Postsurgical changes about the right hilum. Surgical clips project over the left upper chest. IMPRESSION: 1. No acute cardiopulmonary process. Electronically signed by: Norman Gatlin MD 01/11/2024 02:43 AM EDT RP Workstation: [id number]    Assessment and Plan: 68M h/o CAD s/p stenting of LAD on multiple occasions (pt not on aspirin  and statin by choice), prostate cancer s/p radical prostatectomy in 2017, NASH, HTN,  remote C6-C7 fusion and chronic pain p/w fever and AHRF 2/2 aspiration.   Fever AHRF Presumed aspiration PNA Possible CAP Possible acute cystitis -IV CTX 1g daily to complete 5 day CAP course -PO azithromycin  500mg  daily to complete 3 day CAP  course -Duonebs prn -Wean O2 as tolerated -F/u CT chest -F/u urine and blood cultures; titrate abx prn -Foley placed in ED for accurate I/Os; consider voiding trial prior to d/c  Comfort Care  -Palliative care consulted (no call back received); apprec eval/recs -Comfort Care order set placed (IV morphine  prn; IV haldol  prn; IV glycopyrrolate  prn)   Advance Care Planning:   Code Status: Limited: Do not attempt resuscitation (DNR) -DNR-LIMITED -Do Not Intubate/DNI    Consults: N/A  Family Communication: Wife  Severity of Illness: The appropriate patient status for this patient is INPATIENT. Inpatient status is judged to be reasonable and necessary in order to provide the required intensity of service to ensure the patient's safety. The patient's presenting symptoms, physical exam findings, and initial radiographic and laboratory data in the context of their chronic comorbidities is felt to place them at high risk for further clinical deterioration. Furthermore, it is not anticipated that the patient will be medically stable for discharge from the hospital within 2 midnights of admission.   * I certify that at the point of admission it is my clinical judgment that the patient will require inpatient hospital care spanning beyond 2 midnights from the point of admission due to high intensity of service, high risk for further deterioration and high frequency of surveillance required.*   ------- I spent 60 minutes reviewing previous notes, at the bedside counseling/discussing the treatment plan, and performing clinical documentation.  Author: Marsha Ada, MD 01/11/2024 7:35 AM  For on call review [email or web address].

## 2024-01-11 NOTE — ED Notes (Addendum)
 Pt's wife at bedside requesting that I not attach the pt to the cardiac monitor. CCMD called for pt to removed. Wife also requesting something for pain/comfort. Pt suctioned, unable to cough up any sputum. Cleared small amount from the pt's mouth. Provider notified.

## 2024-01-11 NOTE — Sepsis Progress Note (Signed)
 Elink monitoring for the code sepsis protocol.

## 2024-01-11 NOTE — ED Notes (Signed)
Dr. Preston Fleeting at bedside.

## 2024-01-12 ENCOUNTER — Inpatient Hospital Stay (HOSPITAL_COMMUNITY)

## 2024-01-12 DIAGNOSIS — J9601 Acute respiratory failure with hypoxia: Secondary | ICD-10-CM

## 2024-01-12 DIAGNOSIS — J949 Pleural condition, unspecified: Secondary | ICD-10-CM | POA: Diagnosis not present

## 2024-01-12 DIAGNOSIS — I10 Essential (primary) hypertension: Secondary | ICD-10-CM | POA: Diagnosis not present

## 2024-01-12 DIAGNOSIS — R0602 Shortness of breath: Secondary | ICD-10-CM | POA: Diagnosis not present

## 2024-01-12 LAB — CBC WITH DIFFERENTIAL/PLATELET
Abs Immature Granulocytes: 0.02 K/uL (ref 0.00–0.07)
Basophils Absolute: 0 K/uL (ref 0.0–0.1)
Basophils Relative: 1 %
Eosinophils Absolute: 0 K/uL (ref 0.0–0.5)
Eosinophils Relative: 2 %
HCT: 29.5 % — ABNORMAL LOW (ref 39.0–52.0)
Hemoglobin: 10 g/dL — ABNORMAL LOW (ref 13.0–17.0)
Immature Granulocytes: 2 %
Lymphocytes Relative: 25 %
Lymphs Abs: 0.3 K/uL — ABNORMAL LOW (ref 0.7–4.0)
MCH: 36.4 pg — ABNORMAL HIGH (ref 26.0–34.0)
MCHC: 33.9 g/dL (ref 30.0–36.0)
MCV: 107.3 fL — ABNORMAL HIGH (ref 80.0–100.0)
Monocytes Absolute: 0 K/uL — ABNORMAL LOW (ref 0.1–1.0)
Monocytes Relative: 3 %
Neutro Abs: 0.8 K/uL — ABNORMAL LOW (ref 1.7–7.7)
Neutrophils Relative %: 67 %
Platelets: 77 K/uL — ABNORMAL LOW (ref 150–400)
RBC: 2.75 MIL/uL — ABNORMAL LOW (ref 4.22–5.81)
RDW: 13.7 % (ref 11.5–15.5)
Smear Review: NORMAL
WBC: 1.2 K/uL — CL (ref 4.0–10.5)
nRBC: 0 % (ref 0.0–0.2)

## 2024-01-12 LAB — COMPREHENSIVE METABOLIC PANEL WITH GFR
ALT: 38 U/L (ref 0–44)
AST: 45 U/L — ABNORMAL HIGH (ref 15–41)
Albumin: 3.5 g/dL (ref 3.5–5.0)
Alkaline Phosphatase: 37 U/L — ABNORMAL LOW (ref 38–126)
Anion gap: 13 (ref 5–15)
BUN: 23 mg/dL (ref 8–23)
CO2: 24 mmol/L (ref 22–32)
Calcium: 9 mg/dL (ref 8.9–10.3)
Chloride: 101 mmol/L (ref 98–111)
Creatinine, Ser: 0.86 mg/dL (ref 0.61–1.24)
GFR, Estimated: >60 mL/min (ref >60)
Glucose, Bld: 127 mg/dL — ABNORMAL HIGH (ref 70–99)
Potassium: 3.9 mmol/L (ref 3.5–5.1)
Sodium: 138 mmol/L (ref 135–145)
Total Bilirubin: 1 mg/dL (ref 0.0–1.2)
Total Protein: 6.3 g/dL — ABNORMAL LOW (ref 6.5–8.1)

## 2024-01-12 LAB — SARS CORONAVIRUS 2 BY RT PCR: SARS Coronavirus 2 by RT PCR: NEGATIVE

## 2024-01-12 LAB — RESPIRATORY PANEL BY PCR

## 2024-01-12 LAB — MAGNESIUM: Magnesium: 1.6 mg/dL — ABNORMAL LOW (ref 1.7–2.4)

## 2024-01-12 LAB — RETICULOCYTES
Immature Retic Fract: 14.4 % (ref 2.3–15.9)
RBC.: 2.8 MIL/uL — ABNORMAL LOW (ref 4.22–5.81)
Retic Count, Absolute: 58.2 K/uL (ref 19.0–186.0)
Retic Ct Pct: 2.1 % (ref 0.4–3.1)

## 2024-01-12 LAB — TYPE AND SCREEN
ABO/RH(D): O POS
Antibody Screen: NEGATIVE

## 2024-01-12 LAB — TECHNOLOGIST SMEAR REVIEW: Plt Morphology: DECREASED

## 2024-01-12 LAB — LACTIC ACID, PLASMA
Lactic Acid, Venous: 2.3 mmol/L (ref 0.5–1.9)
Lactic Acid, Venous: 2.2 mmol/L (ref 0.5–1.9)

## 2024-01-12 LAB — DIC (DISSEMINATED INTRAVASCULAR COAGULATION)PANEL
D-Dimer, Quant: 3.42 ug{FEU}/mL — ABNORMAL HIGH (ref 0.00–0.50)
Fibrinogen: 641 mg/dL — ABNORMAL HIGH (ref 210–475)
INR: 1.2 (ref 0.8–1.2)
Platelets: 77 K/uL — ABNORMAL LOW (ref 150–400)
Prothrombin Time: 16.3 s — ABNORMAL HIGH (ref 11.4–15.2)
Smear Review: NONE SEEN
aPTT: 33 s (ref 24–36)

## 2024-01-12 LAB — IRON AND TIBC
Iron: 32 ug/dL — ABNORMAL LOW (ref 45–182)
Saturation Ratios: 14 % — ABNORMAL LOW (ref 17.9–39.5)
TIBC: 227 ug/dL — ABNORMAL LOW (ref 250–450)
UIBC: 195 ug/dL

## 2024-01-12 LAB — FERRITIN: Ferritin: 330 ng/mL (ref 24–336)

## 2024-01-12 LAB — C-REACTIVE PROTEIN: CRP: 26.9 mg/dL — ABNORMAL HIGH (ref <1.0)

## 2024-01-12 LAB — PROCALCITONIN: Procalcitonin: 6.16 ng/mL

## 2024-01-12 LAB — PHOSPHORUS: Phosphorus: 1.9 mg/dL — ABNORMAL LOW (ref 2.5–4.6)

## 2024-01-12 LAB — LACTATE DEHYDROGENASE: LDH: 171 U/L (ref 98–192)

## 2024-01-12 LAB — VITAMIN B12: Vitamin B-12: 947 pg/mL — ABNORMAL HIGH (ref 180–914)

## 2024-01-12 LAB — FOLATE: Folate: 5.9 ng/mL — ABNORMAL LOW (ref >5.9)

## 2024-01-12 MED ORDER — MAGNESIUM SULFATE 4 GM/100ML IV SOLN
4.0000 g | Freq: Once | INTRAVENOUS | Status: AC
  Administered 2024-01-12: 4 g via INTRAVENOUS
  Filled 2024-01-12: qty 100

## 2024-01-12 MED ORDER — PIPERACILLIN-TAZOBACTAM 3.375 G IVPB
3.3750 g | Freq: Three times a day (TID) | INTRAVENOUS | Status: DC
  Administered 2024-01-12 – 2024-01-13 (×3): 3.375 g via INTRAVENOUS
  Filled 2024-01-12 (×3): qty 50

## 2024-01-12 MED ORDER — MORPHINE SULFATE (PF) 2 MG/ML IV SOLN
1.0000 mg | INTRAVENOUS | Status: DC | PRN
  Administered 2024-01-12 – 2024-01-13 (×4): 1 mg via INTRAVENOUS
  Filled 2024-01-12 (×4): qty 1

## 2024-01-12 MED ORDER — POTASSIUM PHOSPHATES 15 MMOLE/5ML IV SOLN
30.0000 mmol | Freq: Once | INTRAVENOUS | Status: AC
  Administered 2024-01-12: 30 mmol via INTRAVENOUS
  Filled 2024-01-12 (×2): qty 10

## 2024-01-12 MED ORDER — LACTATED RINGERS IV SOLN: INTRAVENOUS | Status: DC

## 2024-01-12 MED ORDER — PANTOPRAZOLE SODIUM 40 MG PO TBEC
40.0000 mg | DELAYED_RELEASE_TABLET | Freq: Every day | ORAL | Status: DC
  Administered 2024-01-12 – 2024-01-14 (×3): 40 mg via ORAL
  Filled 2024-01-12 (×3): qty 1

## 2024-01-12 NOTE — TOC Initial Note (Signed)
 Transition of Care [facility]) - Initial/Assessment Note    Patient Details  Name: William Montgomery MRN: [id number] Date of Birth: 05-12-1940  [facility]) CM/SW Contact:    Marval Gell, RN Phone Number: 01/12/2024, 1:22 PM  Clinical Narrative:                  Patient admitted from home, lives w spouse. Aspiration PNA, hypoxia currently on HHFNC 12L 100%FiO2 per flowsheet.  TOC will continue to follow   Barriers to Discharge: Continued Medical Work up   Patient Goals and CMS Choice            Expected Discharge Plan and Services       Living arrangements for the past 2 months: Single Family Home                                      Prior Living Arrangements/Services Living arrangements for the past 2 months: Single Family Home Lives with:: Spouse                   Activities of Daily Living   ADL Screening (condition at time of admission) Independently performs ADLs?: No Does the patient have a NEW difficulty with bathing/dressing/toileting/self-feeding that is expected to last >3 days?: Yes (Initiates electronic notice to provider for possible OT consult) Does the patient have a NEW difficulty with getting in/out of bed, walking, or climbing stairs that is expected to last >3 days?: No Does the patient have a NEW difficulty with communication that is expected to last >3 days?: No Is the patient deaf or have difficulty hearing?: Yes Does the patient have difficulty seeing, even when wearing glasses/contacts?: No Does the patient have difficulty concentrating, remembering, or making decisions?: Yes  Permission Sought/Granted                  Emotional Assessment              Admission diagnosis:  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] Acute kidney injury (nontraumatic) (HCC) [N17.9] Elevated random blood glucose level [R73.9] Sepsis due to undetermined organism (HCC) [A41.9] Acute respiratory failure with hypoxemia (HCC)  [J96.01] Acute hypoxemic respiratory failure (HCC) [J96.01] Leukopenia, unspecified type [D72.819] Hypoxic respiratory failure (HCC) [J96.91] Patient Active Problem List   Diagnosis Date Noted   Hypoxic respiratory failure (HCC) 01/11/2024   Acute hypoxemic respiratory failure (HCC) 01/11/2024   Generalized weakness 09/03/2022   Word finding difficulty 09/03/2022   Worsened handwriting 09/03/2022   Proximal muscle weakness 09/03/2022   Incisional hernia s/p lap repair with mesh 03/19/2019 03/19/2019   Recurrent umbilical hernia s/p lap repair with mesh 03/19/2019 03/19/2019   Bilateral inguinal hernia s/p lap repair with mesh 03/19/2019 03/19/2019   Bilateral femoral hernia s/p lap repair with mesh 03/19/2019 03/19/2019   Overweight (BMI 25.0-29.9) 11/28/2018   Accelerating angina (HCC) 03/21/2017   Elevated blood pressure reading without diagnosis of hypertension 09/23/2015   Prostate cancer (HCC) 07/14/2015   Preventative health care 03/19/2014   Insomnia 03/19/2014   Benign prostatic hyperplasia 09/15/2012   Chronic low back pain 10/15/2011   Gallbladder polyp 10/15/2011   Need for shingles vaccine 10/15/2011   Special screening for malignant neoplasms, colon 07/25/2011   History of colonic polyps 07/25/2011   Benign neoplasm of colon 07/25/2011   Diverticulosis of colon 07/25/2011   Colon cancer screening 07/12/2011   Hx of colonic polyp  07/12/2011   CAD (coronary artery disease) 07/12/2011   Low back pain 02/13/2011   Prostate cancer screening 02/13/2011   HTN (hypertension) 01/03/2011   Health maintenance examination 10/13/2010   Transaminasemia 10/13/2010   Hyperlipidemia 11/06/2008   Coronary atherosclerosis 11/06/2008   DEGENERATIVE DISC DISEASE 11/06/2008   PCP:  Candise Aleene DEL, MD Pharmacy:   [facility] [place] [facility] [address] [place] [address] Phone: [phone] Fax: [phone]  [facility] [address] [place] [address] Phone: [phone] Fax: [phone]     Social Drivers of Health (SDOH) Social History: SDOH Screenings   Food Insecurity: Patient Unable To Answer (01/11/2024)  Housing: Unknown (01/11/2024)  Transportation Needs: Patient Unable To Answer (01/11/2024)  Utilities: Patient Unable To Answer (01/11/2024)  Alcohol  Screen: Low Risk  (09/18/2023)  Depression (PHQ2-9): High Risk (09/18/2023)  Financial Resource Strain: Low Risk  (11/05/2023)  Physical Activity: Inactive (11/05/2023)  Social Connections: Patient Unable To Answer (01/11/2024)  Stress: Stress Concern Present (11/05/2023)  Tobacco Use: Medium Risk (01/11/2024)  Health Literacy: Adequate Health Literacy (09/18/2023)   SDOH Interventions:     Readmission Risk Interventions     No data to display

## 2024-01-12 NOTE — Progress Notes (Signed)
 PROGRESS NOTE                                                                                                                                                                                                             Patient Demographics:    William Montgomery, is a 83 y.o. male, DOB - [birth year], FMW:[id number]  Outpatient Primary MD for the patient is McGowen, Aleene DEL, MD    LOS - 1  Admit date - 01/11/2024    Chief Complaint  Patient presents with   Respiratory Distress       Brief Narrative (HPI from H&P)   83 y.o. male with medical history significant of CAD s/p stenting of LAD on multiple occasions (pt not on aspirin  and statin by choice), prostate cancer s/p radical prostatectomy in 2017, NASH, HTN, remote C6-C7 fusion and chronic pain p/w fever and AHRF 2/2 aspiration.    Subjective:    William Montgomery today has, No headache, No chest pain, No abdominal pain - No Nausea, No new weakness tingling or numbness, no SOB   Assessment  & Plan :   Sepsis due to aspiration pneumonia with acute hypoxic respiratory failure.  This is due to nausea vomiting and possibly ongoing aspiration, has been placed on empiric antibiotics, speech therapy to evaluate, hydrate, clinically improving, follow cultures.  Family agreeable for general medical treatment, if significant decline then full comfort measures.  Remains DNR.  CAD s/p stenting in the past.  Not on aspirin  or statin per his personal choice, continue to monitor with supportive care  Hypomagnesemia, hypophosphatemia.  Replaced.  Pancytopenia.  Acute on chronic, likely due to sepsis, DIC panel inconclusive, monitor peripheral smear and levels, treat underlying sepsis,      Condition - Extremely Guarded  Family Communication  : Wife bedside on 01/12/2024  Code Status : DNR  Consults  : Pall care  PUD Prophylaxis : PPI   Procedures  :     CT - 1. Bronchial wall  thickening bilaterally with mild multifocal mucous plugging in the lower lobes and atelectasis or infiltrate in the left lower lobe. 2. Scattered pulmonary nodules bilaterally measuring up to 1 cm. Consider one of the following in 3 months for both low-risk and high-risk individuals: (a) repeat chest CT, (b) follow-up PET-CT, or (c) tissue sampling. This recommendation follows the consensus  statement: Guidelines for Management of Incidental Pulmonary Nodules Detected on CT Images: From the Fleischner Society 2017; Radiology 2017; 284:228-243. 3. Hepatic steatosis. 4. Coronary artery calcifications. 5. Aortic atherosclerosis.       Disposition Plan  :    Status is: Inpatient   DVT Prophylaxis  :    heparin  injection 5,000 Units Start: 01/11/24 0645    Lab Results  Component Value Date   PLT 77 (L) 01/12/2024    Diet :  Diet Order             Diet regular Room service appropriate? Yes; Fluid consistency: Thin  Diet effective now                    Inpatient Medications  Scheduled Meds:  escitalopram   20 mg Oral Daily   heparin   5,000 Units Subcutaneous Q8H   Continuous Infusions:  azithromycin  500 mg (01/12/24 9166)   cefTRIAXone  (ROCEPHIN )  IV 1 g (01/11/24 2119)   lactated ringers      magnesium  sulfate bolus IVPB     potassium PHOSPHATE  IVPB (in mmol)     PRN Meds:.acetaminophen  **OR** acetaminophen , artificial tears, haloperidol  lactate, ipratropium-albuterol , morphine  injection, ondansetron  **OR** ondansetron  (ZOFRAN ) IV  Antibiotics  :    Anti-infectives (From admission, onward)    Start     Dose/Rate Route Frequency Ordered Stop   01/11/24 2200  cefTRIAXone  (ROCEPHIN ) 1 g in sodium chloride  0.9 % 100 mL IVPB        1 g 200 mL/hr over 30 Minutes Intravenous Every 24 hours 01/11/24 0630     01/11/24 1000  azithromycin  (ZITHROMAX ) 500 mg in sodium chloride  0.9 % 250 mL IVPB        500 mg 250 mL/hr over 60 Minutes Intravenous Every 24 hours 01/11/24 0630      01/11/24 0215  cefTRIAXone  (ROCEPHIN ) 2 g in sodium chloride  0.9 % 100 mL IVPB        2 g 200 mL/hr over 30 Minutes Intravenous  Once 01/11/24 0205 01/11/24 0324         Objective:   Vitals:   01/11/24 1100 01/11/24 1246 01/11/24 1345 01/11/24 1934  BP: (!) 143/81 139/82  129/79  Pulse: 92 81 86 85  Resp: 16 (!) 23 18 20   Temp:  98.3 F (36.8 C) 99.5 F (37.5 C) 99 F (37.2 C)  TempSrc:  Oral  Oral  SpO2: 92% 93% 93% (!) 88%  Weight:      Height:        Wt Readings from Last 3 Encounters:  01/11/24 73.9 kg  09/04/23 73.9 kg  08/05/23 75 kg     Intake/Output Summary (Last 24 hours) at 01/12/2024 0935 Last data filed at 01/12/2024 9375 Gross per 24 hour  Intake --  Output 700 ml  Net -700 ml     Physical Exam  Awake, No new F.N deficits, Normal affect William Montgomery.AT,PERRAL Supple Neck, No JVD,   Symmetrical Chest wall movement, Good air movement bilaterally, CTAB RRR,No Gallops,Rubs or new Murmurs,  +ve B.Sounds, Abd Soft, No tenderness,   No Cyanosis, Clubbing or edema       Data Review:    Recent Labs  Lab 01/11/24 0205 01/11/24 0631 01/12/24 0823  WBC 1.6* 0.8*  --   HGB 12.9* 11.5*  --   HCT 38.5* 34.4*  --   PLT 112* 90* 77*  MCV 106.9* 107.5*  --   MCH 35.8* 35.9*  --   MCHC 33.5  33.4  --   RDW 13.2 13.5  --   LYMPHSABS 0.5*  --   --   MONOABS 0.1  --   --   EOSABS 0.0  --   --   BASOSABS 0.0  --   --     Recent Labs  Lab 01/11/24 0205 01/11/24 0215 01/11/24 0435 01/11/24 0631 01/12/24 0823  NA 137  --   --   --  138  K 4.6  --   --   --  3.9  CL 100  --   --   --  101  CO2 24  --   --   --  24  ANIONGAP 13  --   --   --  13  GLUCOSE 145*  --   --   --  127*  BUN 16  --   --   --  23  CREATININE 1.47*  --   --  1.50* 0.86  AST 40  --   --   --  45*  ALT 49*  --   --   --  38  ALKPHOS 53  --   --   --  37*  BILITOT 0.9  --   --   --  1.0  ALBUMIN 4.2  --   --   --  3.5  CRP  --   --   --   --  26.9*  DDIMER  --   --   --   --  3.42*   LATICACIDVEN  --  4.2* 7.5*  --  2.3*  INR 1.0  --   --   --  1.2  MG  --   --   --   --  1.6*  PHOS  --   --   --   --  1.9*  CALCIUM  9.0  --   --   --  9.0      Recent Labs  Lab 01/11/24 0205 01/11/24 0215 01/11/24 0435 01/12/24 0823  CRP  --   --   --  26.9*  DDIMER  --   --   --  3.42*  LATICACIDVEN  --  4.2* 7.5* 2.3*  INR 1.0  --   --  1.2  MG  --   --   --  1.6*  CALCIUM  9.0  --   --  9.0    --------------------------------------------------------------------------------------------------------------- Lab Results  Component Value Date   CHOL 163 09/03/2022   HDL 50 09/03/2022   LDLCALC 92 09/03/2022   LDLDIRECT 113.0 12/30/2020   TRIG 104 09/03/2022   CHOLHDL 3.3 09/03/2022    Lab Results  Component Value Date   HGBA1C 5.2 09/04/2022   No results for input(s): TSH, T4TOTAL, FREET4, T3FREE, THYROIDAB in the last 72 hours. Recent Labs    01/12/24 0823  RETICCTPCT 2.1   ------------------------------------------------------------------------------------------------------------------ Cardiac Enzymes No results for input(s): CKMB, TROPONINI, MYOGLOBIN in the last 168 hours.  Invalid input(s): CK  Micro Results Recent Results (from the past 240 hours)  Blood Culture (routine x 2)     Status: None (Preliminary result)   Collection Time: 01/11/24  2:05 AM   Specimen: BLOOD  Result Value Ref Range Status   Specimen Description BLOOD RIGHT ANTECUBITAL  Final   Special Requests   Final    BOTTLES DRAWN AEROBIC AND ANAEROBIC Blood Culture adequate volume   Culture   Final    NO GROWTH < 12 HOURS Performed at [facility]  Lab, [address] [place], [place] [address]    Report Status PENDING  Incomplete  Blood Culture (routine x 2)     Status: None (Preliminary result)   Collection Time: 01/11/24  2:25 AM   Specimen: BLOOD  Result Value Ref Range Status   Specimen Description BLOOD BLOOD RIGHT HAND  Final   Special Requests   Final     BOTTLES DRAWN AEROBIC AND ANAEROBIC Blood Culture adequate volume   Culture   Final    NO GROWTH < 12 HOURS Performed at [facility], [address], [place], [place] [address]    Report Status PENDING  Incomplete  Respiratory (~20 pathogens) panel by PCR     Status: None   Collection Time: 01/12/24  5:49 AM   Specimen: Nasopharyngeal Swab; Respiratory  Result Value Ref Range Status   Adenovirus NOT DETECTED NOT DETECTED Final   Coronavirus 229E NOT DETECTED NOT DETECTED Final    Comment: (NOTE) The Coronavirus on the Respiratory Panel, DOES NOT test for the novel  Coronavirus (2019 nCoV)    Coronavirus HKU1 NOT DETECTED NOT DETECTED Final   Coronavirus NL63 NOT DETECTED NOT DETECTED Final   Coronavirus OC43 NOT DETECTED NOT DETECTED Final   Metapneumovirus NOT DETECTED NOT DETECTED Final   Rhinovirus / Enterovirus NOT DETECTED NOT DETECTED Final   Influenza A NOT DETECTED NOT DETECTED Final   Influenza B NOT DETECTED NOT DETECTED Final   Parainfluenza Virus 1 NOT DETECTED NOT DETECTED Final   Parainfluenza Virus 2 NOT DETECTED NOT DETECTED Final   Parainfluenza Virus 3 NOT DETECTED NOT DETECTED Final   Parainfluenza Virus 4 NOT DETECTED NOT DETECTED Final   Respiratory Syncytial Virus NOT DETECTED NOT DETECTED Final   Bordetella pertussis NOT DETECTED NOT DETECTED Final   Bordetella Parapertussis NOT DETECTED NOT DETECTED Final   Chlamydophila pneumoniae NOT DETECTED NOT DETECTED Final   Mycoplasma pneumoniae NOT DETECTED NOT DETECTED Final    Comment: Performed at [facility] Lab, [address], [place], [place] [address]  SARS Coronavirus 2 by RT PCR (hospital order, performed in [facility] hospital lab) *cepheid single result test* Anterior Nasal Swab     Status: None   Collection Time: 01/12/24  5:50 AM   Specimen: Anterior Nasal Swab  Result Value Ref Range Status   SARS Coronavirus 2 by RT PCR NEGATIVE NEGATIVE Final    Comment: Performed at [facility] Lab, [address] [place], [place] [address]    Radiology Report DG Chest Port 1 View Result Date: 01/12/2024 EXAM: 1 VIEW XRAY OF THE CHEST 01/12/2024 06:08:00 AM COMPARISON: AP radiograph of the chest dated 01/11/2024. CLINICAL HISTORY: [id number] SOB (shortness of breath) [id number]. reason for exam: SOB (shortness of breath) ; Hx of HTN SOB (shortness of breath) ; Hx of HTN FINDINGS: LUNGS AND PLEURA: Mildly prominent interstitial opacities, similar to the prior study. Mild biapical pleural disease. No adverse interval change. HEART AND MEDIASTINUM: The heart is normal in size. BONES AND SOFT TISSUES: Postsurgical deformity of the right fifth rib. Surgical clips are noted in the right hilar region and overlying the apex of the left chest cavity. IMPRESSION: 1. Mildly prominent interstitial opacities, similar to the prior study. 2. Mild biapical pleural disease. Electronically signed by: Evalene Coho MD 01/12/2024 06:30 AM EDT RP Workstation: [id number]   CT CHEST WO CONTRAST Result Date: 01/11/2024 CLINICAL DATA:  Chronic dyspnea a, chest wall or pleural disease suspected. Hypoxic respiratory failure. EXAM: CT  CHEST WITHOUT CONTRAST TECHNIQUE: Multidetector CT imaging of the chest was performed following the standard protocol without IV contrast. RADIATION DOSE REDUCTION: This exam was performed according to the departmental dose-optimization program which includes automated exposure control, adjustment of the mA and/or kV according to patient size and/or use of iterative reconstruction technique. COMPARISON:  None Available. FINDINGS: Cardiovascular: The heart is normal in size and there is no pericardial effusion. Multi-vessel coronary artery calcifications are noted. There is atherosclerotic calcification of the aorta a without evidence of aneurysm. The pulmonary trunk is normal in caliber. Mediastinum/Nodes: No mediastinal or axillary lymphadenopathy. Evaluation of the hila is limited due to lack of  IV contrast. The thyroid  gland, trachea, and esophagus are within normal limits. Surgical clips are noted in the mediastinum on the right and anterior chest wall on the left. Lungs/Pleura: Pleuroparenchymal scarring is noted at the lung apices bilaterally. Atelectasis is present bilaterally. There is bronchial wall thickening bilaterally with a few areas of mucous plugging and mild atelectasis or infiltrate in the left lower lobe. There is a right lower lobe nodule measuring 1.0 x 0.6 cm, axial image 65. There is a 7 mm subpleural nodule in the right lower lobe, axial image 73. A 3 mm nodule is present in the right lower lobe, axial image 82. There is a 4 mm nodule in the right lower lobe, axial image 80 a. there is a 4 mm nodule in the right lower lobe, axial image 102. There is a 5 mm nodule in the left lower lobe, axial image 91. No effusion or pneumothorax is seen. Upper Abdomen: Fatty infiltration of the liver is noted. No acute abnormality. Musculoskeletal: Degenerative changes are present in the thoracic spine. There is bony deformity of the ribs on the right, which may be postsurgical or posttraumatic. No acute osseous abnormality is seen. IMPRESSION: 1. Bronchial wall thickening bilaterally with mild multifocal mucous plugging in the lower lobes and atelectasis or infiltrate in the left lower lobe. 2. Scattered pulmonary nodules bilaterally measuring up to 1 cm. Consider one of the following in 3 months for both low-risk and high-risk individuals: (a) repeat chest CT, (b) follow-up PET-CT, or (c) tissue sampling. This recommendation follows the consensus statement: Guidelines for Management of Incidental Pulmonary Nodules Detected on CT Images: From the Fleischner Society 2017; Radiology 2017; 284:228-243. 3. Hepatic steatosis. 4. Coronary artery calcifications. 5. Aortic atherosclerosis. Electronically Signed   By: Leita Birmingham M.D.   On: 01/11/2024 17:35   DG Chest Port 1 View Result Date:  01/11/2024 EXAM: 1 VIEW(S) XRAY OF THE CHEST 01/11/2024 02:16:00 AM COMPARISON: Comparison with chest radiograph 05/30/2017. CLINICAL HISTORY: Questionable sepsis - evaluate for abnormality. Respiratory distress. FINDINGS: LUNGS AND PLEURA: The lungs are clear. No pleural effusion. No pneumothorax. HEART AND MEDIASTINUM: Stable cardiomediastinal silhouette. BONES AND SOFT TISSUES: Postsurgical changes about the right hilum. Surgical clips project over the left upper chest. IMPRESSION: 1. No acute cardiopulmonary process. Electronically signed by: Norman Gatlin MD 01/11/2024 02:43 AM EDT RP Workstation: [id number]     Signature  -   Lavada Stank M.D on 01/12/2024 at 9:35 AM   -  To page go to [email or web address]

## 2024-01-12 NOTE — Evaluation (Signed)
 Clinical/Bedside Swallow Evaluation Patient Details  Name: William Montgomery MRN: [id number] Date of Birth: Oct 16, 1940  Today's Date: 01/12/2024 Time: SLP Start Time (ACUTE ONLY): 9347 SLP Stop Time (ACUTE ONLY): 0716 SLP Time Calculation (min) (ACUTE ONLY): 24 min  Past Medical History:  Past Medical History:  Diagnosis Date   Arthritis    joints/  back/  neck   Chronic headaches    due to cervical spondylosis   Chronic renal insufficiency, stage 2 (mild)    borderlines II/III   Coronary artery disease 1999   cardiologist--- dr lavona---  (per cardiology note pt refused to take asa/ statin) hx PCI w/ stenting in 1999 to LAD and 12-23-2007 x2 DES to proximal LAD;  Cath 03/2017-->  non- critical 70 - 80% mid/distal circ stenosis,  chronic small D1 80% stenosis (stable) and DES to proximal and mid LAD--   Med mgmt rec'd-->nitrates caused HAs, ACE-I caused cough.   Corticobasal syndrome (HCC)    [facility] neurol 04/2023   DDD (degenerative disc disease)    lumbar and cervical neck   Diverticulosis of sigmoid colon 07/25/2011   Severe (endoscopy by Dr. Jakie)   Erectile dysfunction after radical prostatectomy    History of kidney stones    History of prostate cancer 12/2014   urologist-- dr matilda---   s/p Robot assisted radical prostatectomy 07/2015: lymph node involvement//   undetactable PSA since   HTN (hypertension)    ACE-I cough.  Fine on ARB.   Hx of blastomycosis 1978   s/p  right upper lobectomy   Hyperlipidemia    NASH (nonalcoholic steatohepatitis) 06/2011   normal LFTS  /  Hep B and C testing neg.  Mild fatty liver on CT 2020   Nocturia    Parkinsonism (HCC)    2023/24, unknown etiology (Dat scan neg), peripheral nerve bx, and spinal fluid testing all NEG/Norma. [facility] 2nd opinion-->corticobasal syndrome 04/2023)   Pseudobulbar affect    Pulmonary nodules    small, picked up on CT ab/pelv for abd pains/flank pains-->pt quit smoking 40 yrs ago.  Low risk for lung ca  so no f/u CT needed.   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   Tinnitus    Wears glasses    Wears hearing aid in both ears    Past Surgical History:  Past Surgical History:  Procedure Laterality Date   ANTERIOR CERVICAL DECOMP/DISCECTOMY FUSION  1982   C6--7   APPENDECTOMY  1953   CARDIAC CATHETERIZATION  02/27/2000   @[facility]  by dr hochrein ---  single vessel w/ preserved LVSF   CORONARY ANGIOPLASTY WITH STENT PLACEMENT  12/23/2007   by Dr End--- PCI w/ DES x2 to  pLAD   CORONARY STENT PLACEMENT  1999   PTCA w/ stenting to LAd   CYSTOSCOPY WITH RETROGRADE PYELOGRAM, URETEROSCOPY AND STENT PLACEMENT Left 05/10/2018   Procedure: CYSTOSCOPY WITH LEFT RETROGRADE PYELOGRAM, LEFT URETEROSCOPY AND LEFT URETERAL STENT PLACEMENT;  Surgeon: Alvaro Hummer, MD;  Location: [facility];  Service: Urology;  Laterality: Left;   HOLMIUM LASER APPLICATION Left 05/10/2018   Procedure: HOLMIUM LASER APPLICATION;  Surgeon: Alvaro Hummer, MD;  Location: [facility];  Service: Urology;  Laterality: Left;   INCISIONAL HERNIA REPAIR N/A 03/19/2019   Procedure: LAPAROSCOPIC INCISIONAL HERNIA REPAIR WITH MESH, RECURRENT  UMBILICAL HERNIA REPAIR;  Surgeon: Sheldon Standing, MD;  Location: [facility];  Service: General;  Laterality: N/A;   INGUINAL HERNIA REPAIR Right 03/19/2019   Procedure: LAPAROSCOPIC BILATERAL FEMORAL AND INGUINAL HERNIA REPAIR WITH MESH, LYSIS OF ADHESIONS;  Surgeon: Sheldon Standing, MD;  Location: [facility];  Service: General;  Laterality: Right;   LEFT HEART CATH AND CORONARY ANGIOGRAPHY N/A 03/21/2017   Stents patent; mod circ dz, nothing for intervention--imdur  added.  Procedure: LEFT HEART CATH AND CORONARY ANGIOGRAPHY;  Surgeon: Mady Bruckner, MD;  Location: [facility];  Service: Cardiovascular;  Laterality: N/A;   LUMBAR SPINE  SURGERY  1978   L5--S1   LUNG LOBECTOMY Right 1978   right upper lobectomy for blastomycosis   LYMPHADENECTOMY Bilateral 07/14/2015   Procedure: LYMPHADENECTOMY;  Surgeon: Gretel Ferrara, MD;  Location: [facility];  Service: Urology;  Laterality: Bilateral;   ROBOT ASSISTED LAPAROSCOPIC RADICAL PROSTATECTOMY N/A 07/14/2015   One positive pelvic LN.  Procedure: XI ROBOTIC ASSISTED LAPAROSCOPIC RADICAL PROSTATECTOMY LEVEL 2;  Surgeon: Gretel Ferrara, MD;  Location: [facility];  Service: Urology;  Laterality: N/A;   ROTATOR CUFF REPAIR  2007   UMBILICAL HERNIA REPAIR  07/14/2015   Procedure: HERNIA REPAIR UMBILICAL ADULT;  Surgeon: Gretel Ferrara, MD;  Location: [facility];  Service: Urology;;   HPI:  William Montgomery is a 83 y.o. male with medical history significant of CAD s/p stenting of LAD on multiple occasions (pt not on aspirin  and statin by choice), prostate cancer s/p radical prostatectomy in 2017, NASH, HTN, remote C6-C7 fusion and chronic pain p/w fever and AHRF 2/2 aspiration.   He was diagnosed with presumed aspiration PNA vs CAP and possible acute cystitis.  CT of the chest was showing bronchial wall thickening with mild multifocal mucous plugging in the LL and atelectasis or infiltrate in the LLL.    Assessment / Plan / Recommendation  Clinical Impression  Clinical swallowing evaluation was completed in setting of admission for possible aspiration PNA following vomiting event while laying flat in bed.  Patient's wife was present and reported very intermittent coughing with intake of fluids and that he had no issues with food.  His speech was very hard to understand.  Cranial nerve exam was completed and remarkable for decreased lingual strength bilaterally.  Facial and jaw range of motion and strength appeared adequate.  Facial sensation appeared to be intact and he did not endorse a difference in sensation between the right and left side of his face.   He was presented with ice chips, thin liquids via  spoon, cup and straw, pureed material and dry solids.  He required full assistance for all intake which is baseline.  Wife reported use of a special straw cup at home.  He presented with a likely mild oral dysphagia and there was concern for a possible pharyngeal dysphagia given his known history of Parkinson's disease.  Mastication appeared adequate with mild oral residue noted post swallow.  Swallow trigger was appreciated to palpation and overt s/s of aspiration were not seen.  Suggest beginning a regular textured diet with thin liquids.  MBS will be completed to fully assess swallowing physiology and safety. SLP Visit Diagnosis: Dysphagia, unspecified (R13.10)    Aspiration Risk  Mild aspiration risk    Diet Recommendation Regular;Thin liquid    Liquid Administration via: Cup;Straw Medication Administration: Whole meds with liquid Supervision: Comment (Total assist needed.) Compensations: Minimize environmental distractions;Slow rate Postural Changes: Seated upright at 90 degrees;Remain upright for at least  30 minutes after po intake    Other  Recommendations Oral Care Recommendations: Oral care BID     Assistance Recommended at Discharge  Full Assistance  Functional Status Assessment Patient has had a recent decline in their functional status and demonstrates the ability to make significant improvements in function in a reasonable and predictable amount of time.         Prognosis Prognosis for improved oropharyngeal function: Fair      Swallow Study   General Date of Onset: 01/11/24 HPI: TAMIKA NOU is a 83 y.o. male with medical history significant of CAD s/p stenting of LAD on multiple occasions (pt not on aspirin  and statin by choice), prostate cancer s/p radical prostatectomy in 2017, NASH, HTN, remote C6-C7 fusion and chronic pain p/w fever and AHRF 2/2 aspiration.   He was diagnosed with presumed aspiration PNA vs CAP and possible acute cystitis.  CT of the chest was  showing bronchial wall thickening with mild multifocal mucous plugging in the LL and atelectasis or infiltrate in the LLL. Type of Study: Bedside Swallow Evaluation Previous Swallow Assessment: None noted at [facility]. Diet Prior to this Study: NPO Temperature Spikes Noted: Yes Respiratory Status: Nasal cannula History of Recent Intubation: No Behavior/Cognition: Alert;Cooperative Oral Cavity Assessment: Within Functional Limits Oral Care Completed by SLP: No Oral Cavity - Dentition: Adequate natural dentition Vision: Functional for self-feeding Self-Feeding Abilities: Total assist Patient Positioning: Upright in bed Baseline Vocal Quality: Low vocal intensity Volitional Swallow: Unable to elicit    Oral/Motor/Sensory Function Overall Oral Motor/Sensory Function: Mild impairment Facial ROM: Within Functional Limits Facial Symmetry: Within Functional Limits Facial Strength: Within Functional Limits Facial Sensation: Within Functional Limits Lingual ROM: Within Functional Limits Lingual Symmetry: Within Functional Limits Lingual Strength: Reduced Mandible: Within Functional Limits   Ice Chips Ice chips: Within functional limits Presentation: Spoon   Thin Liquid Thin Liquid: Within functional limits Presentation: Cup;Spoon;Straw    Nectar Thick Nectar Thick Liquid: Not tested   Honey Thick Honey Thick Liquid: Not tested   Puree Puree: Within functional limits Presentation: Spoon   Solid     Solid: Impaired Presentation: Spoon Oral Phase Impairments: Impaired mastication Oral Phase Functional Implications: Oral residue     Eleanor Eagles, MA, CCC-SLP Acute Rehab SLP [phone]  Eleanor LOISE Eagles 01/12/2024,7:36 AM

## 2024-01-12 NOTE — Progress Notes (Signed)
 Pharmacy Antibiotic Note  William Montgomery is a 83 y.o. male admitted on 01/11/2024 with aspiration pneumonia.  Pharmacy has been consulted for Zosyn  dosing.  Plan: Zosyn  3.375g IV q8h (4 hour infusion).  Scr of 0.83 (BL ~1) and down from 1.5. Continue to monitor renal function.  Height: 5' 9 (175.3 cm) Weight: 73.9 kg (162 lb 14.7 oz) IBW/kg (Calculated) : 70.7  Temp (24hrs), Avg:98.9 F (37.2 C), Min:98.3 F (36.8 C), Max:99.5 F (37.5 C)  Recent Labs  Lab 01/11/24 0205 01/11/24 0215 01/11/24 0435 01/11/24 0631 01/12/24 0823  WBC 1.6*  --   --  0.8*  --   CREATININE 1.47*  --   --  1.50* 0.86  LATICACIDVEN  --  4.2* 7.5*  --  2.3*    Estimated Creatinine Clearance: 66.2 mL/min (by C-G formula based on SCr of 0.86 mg/dL).    Allergies  Allergen Reactions   Ace Inhibitors Cough   Imdur  [Isosorbide  Dinitrate] Other (See Comments)    Headache    Antimicrobials this admission: 9/6 Ceftriaxone  >> 9/7 9/6 Azithromycin  >> Current 9/7 Zosyn  >> Current  Dose adjustments this admission: None  Microbiology results: 9/6 BCx: 4/4 NGTD < 12 hours   Thank you for allowing pharmacy to be a part of this patient's care.  Prentice DOROTHA Favors, PharmD PGY1 Health-System Pharmacy Administration and Leadership Resident [facility]  01/12/2024 9:51 AM

## 2024-01-13 ENCOUNTER — Inpatient Hospital Stay (HOSPITAL_COMMUNITY)

## 2024-01-13 DIAGNOSIS — Z66 Do not resuscitate: Secondary | ICD-10-CM

## 2024-01-13 DIAGNOSIS — Z515 Encounter for palliative care: Secondary | ICD-10-CM

## 2024-01-13 DIAGNOSIS — J9601 Acute respiratory failure with hypoxia: Secondary | ICD-10-CM

## 2024-01-13 LAB — COMPREHENSIVE METABOLIC PANEL WITH GFR
ALT: 35 U/L (ref 0–44)
AST: 37 U/L (ref 15–41)
Albumin: 2.9 g/dL — ABNORMAL LOW (ref 3.5–5.0)
Alkaline Phosphatase: 35 U/L — ABNORMAL LOW (ref 38–126)
Anion gap: 11 (ref 5–15)
BUN: 11 mg/dL (ref 8–23)
CO2: 25 mmol/L (ref 22–32)
Calcium: 8.1 mg/dL — ABNORMAL LOW (ref 8.9–10.3)
Chloride: 99 mmol/L (ref 98–111)
Creatinine, Ser: 0.82 mg/dL (ref 0.61–1.24)
GFR, Estimated: >60 mL/min (ref >60)
Glucose, Bld: 135 mg/dL — ABNORMAL HIGH (ref 70–99)
Potassium: 3.6 mmol/L (ref 3.5–5.1)
Sodium: 135 mmol/L (ref 135–145)
Total Bilirubin: 0.8 mg/dL (ref 0.0–1.2)
Total Protein: 5.6 g/dL — ABNORMAL LOW (ref 6.5–8.1)

## 2024-01-13 LAB — CBC WITH DIFFERENTIAL/PLATELET
Basophils Absolute: 0 K/uL (ref 0.0–0.1)
Basophils Relative: 0 %
Eosinophils Absolute: 0.1 K/uL (ref 0.0–0.5)
Eosinophils Relative: 5 %
HCT: 25.5 % — ABNORMAL LOW (ref 39.0–52.0)
Hemoglobin: 8.7 g/dL — ABNORMAL LOW (ref 13.0–17.0)
Lymphocytes Relative: 30 %
Lymphs Abs: 0.4 K/uL — ABNORMAL LOW (ref 0.7–4.0)
MCH: 36.3 pg — ABNORMAL HIGH (ref 26.0–34.0)
MCHC: 34.1 g/dL (ref 30.0–36.0)
MCV: 106.3 fL — ABNORMAL HIGH (ref 80.0–100.0)
Monocytes Absolute: 0 K/uL — ABNORMAL LOW (ref 0.1–1.0)
Monocytes Relative: 2 %
Neutro Abs: 0.9 K/uL — ABNORMAL LOW (ref 1.7–7.7)
Neutrophils Relative %: 63 %
Platelets: 70 K/uL — ABNORMAL LOW (ref 150–400)
RBC: 2.4 MIL/uL — ABNORMAL LOW (ref 4.22–5.81)
RDW: 13.5 % (ref 11.5–15.5)
WBC: 1.5 K/uL — ABNORMAL LOW (ref 4.0–10.5)
nRBC: 0 % (ref 0.0–0.2)

## 2024-01-13 LAB — PROCALCITONIN: Procalcitonin: 3.9 ng/mL

## 2024-01-13 LAB — C-REACTIVE PROTEIN: CRP: 21.8 mg/dL — ABNORMAL HIGH (ref <1.0)

## 2024-01-13 LAB — MAGNESIUM: Magnesium: 1.8 mg/dL (ref 1.7–2.4)

## 2024-01-13 LAB — HAPTOGLOBIN: Haptoglobin: 192 mg/dL (ref 38–329)

## 2024-01-13 LAB — PHOSPHORUS: Phosphorus: 2.9 mg/dL (ref 2.5–4.6)

## 2024-01-13 MED ORDER — MORPHINE SULFATE (PF) 2 MG/ML IV SOLN
1.0000 mg | INTRAVENOUS | Status: DC | PRN
  Administered 2024-01-13 – 2024-01-14 (×4): 1 mg via INTRAVENOUS
  Filled 2024-01-13 (×4): qty 1

## 2024-01-13 MED ORDER — LORAZEPAM 2 MG/ML PO CONC: 1.0000 mg | ORAL | Status: DC | PRN

## 2024-01-13 MED ORDER — LORAZEPAM 2 MG/ML PO CONC
1.0000 mg | ORAL | Status: DC | PRN
  Administered 2024-01-13 (×2): 1 mg via SUBLINGUAL
  Filled 2024-01-13 (×2): qty 1

## 2024-01-13 MED ORDER — LORAZEPAM 2 MG/ML IJ SOLN: 1.0000 mg | INTRAMUSCULAR | Status: DC

## 2024-01-13 MED ORDER — ARTIFICIAL TEARS OPHTHALMIC OINT
TOPICAL_OINTMENT | Freq: Every evening | OPHTHALMIC | Status: DC | PRN
  Filled 2024-01-13 (×2): qty 3.5

## 2024-01-13 NOTE — Progress Notes (Signed)
 PROGRESS NOTE                                                                                                                                                                                                             Patient Demographics:    William Montgomery, is a 83 y.o. male, DOB - 1940/07/25, FMW:[id number]  Outpatient Primary MD for the patient is McGowen, Aleene DEL, MD    LOS - 2  Admit date - 01/11/2024    Chief Complaint  Patient presents with   Respiratory Distress       Brief Narrative (HPI from H&P)   83 y.o. male with medical history significant of corticobasal syndrome with baseline dysarthria, dysphagia, extremely poor quality of life at baseline, CAD s/p stenting of LAD on multiple occasions (pt not on aspirin  and statin by choice), prostate cancer s/p radical prostatectomy in 2017, NASH, HTN, remote C6-C7 fusion and chronic pain p/w fever and AHRF 2/2 aspiration.    Subjective:   Patient in bed, denies any headache or chest pain, baseline dysarthria and dysphagia   Assessment  & Plan :   Sepsis due to aspiration pneumonia with acute hypoxic respiratory failure.  This is due to nausea vomiting and possibly ongoing aspiration, has been placed on empiric antibiotics, speech therapy to evaluate, hydrate, clinically improving, follow cultures.  Family agreeable for general medical treatment, if significant decline then full comfort measures.  Remains DNR.  Await palliative care input today.  CAD s/p stenting in the past.  Not on aspirin  or statin per his personal choice, continue to monitor with supportive care  Hypomagnesemia, hypophosphatemia.  Replaced.  Pancytopenia.  Acute on chronic, likely due to sepsis, DIC panel inconclusive, monitor peripheral smear and levels, treat underlying sepsis.  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  Supportive care.      Condition - Extremely Guarded  Family Communication  : Wife bedside on 01/12/2024  Code Status : DNR  Consults  : Pall care  PUD Prophylaxis : PPI   Procedures  :     CT - 1. Bronchial wall thickening bilaterally with mild multifocal mucous plugging in the lower lobes and atelectasis or infiltrate in the left lower lobe. 2. Scattered pulmonary nodules bilaterally measuring up to 1 cm. Consider one of the  following in 3 months for both low-risk and high-risk individuals: (a) repeat chest CT, (b) follow-up PET-CT, or (c) tissue sampling. This recommendation follows the consensus statement: Guidelines for Management of Incidental Pulmonary Nodules Detected on CT Images: From the Fleischner Society 2017; Radiology 2017; 284:228-243. 3. Hepatic steatosis. 4. Coronary artery calcifications. 5. Aortic atherosclerosis.       Disposition Plan  :    Status is: Inpatient   DVT Prophylaxis  :    heparin  injection 5,000 Units Start: 01/11/24 0645    Lab Results  Component Value Date   PLT 70 (L) 01/13/2024    Diet :  Diet Order             Diet regular Room service appropriate? Yes; Fluid consistency: Thin  Diet effective now                    Inpatient Medications  Scheduled Meds:  escitalopram   20 mg Oral Daily   heparin   5,000 Units Subcutaneous Q8H   pantoprazole   40 mg Oral Daily   Continuous Infusions:  azithromycin  Stopped (01/12/24 1007)   piperacillin -tazobactam (ZOSYN )  IV 3.375 g (01/13/24 0458)   PRN Meds:.acetaminophen  **OR** acetaminophen , artificial tears, haloperidol  lactate, ipratropium-albuterol , morphine  injection, ondansetron  **OR** ondansetron  (ZOFRAN ) IV  Antibiotics  :    Anti-infectives (From admission, onward)    Start     Dose/Rate Route Frequency Ordered Stop   01/12/24 1400  piperacillin -tazobactam (ZOSYN ) IVPB 3.375 g        3.375 g 12.5 mL/hr over 240 Minutes Intravenous Every 8 hours 01/12/24 0948      01/11/24 2200  cefTRIAXone  (ROCEPHIN ) 1 g in sodium chloride  0.9 % 100 mL IVPB  Status:  Discontinued        1 g 200 mL/hr over 30 Minutes Intravenous Every 24 hours 01/11/24 0630 01/12/24 0936   01/11/24 1000  azithromycin  (ZITHROMAX ) 500 mg in sodium chloride  0.9 % 250 mL IVPB        500 mg 250 mL/hr over 60 Minutes Intravenous Every 24 hours 01/11/24 0630     01/11/24 0215  cefTRIAXone  (ROCEPHIN ) 2 g in sodium chloride  0.9 % 100 mL IVPB        2 g 200 mL/hr over 30 Minutes Intravenous  Once 01/11/24 0205 01/11/24 0324         Objective:   Vitals:   01/12/24 1618 01/12/24 2230 01/12/24 2249 01/13/24 0000  BP: 131/69 (!) 146/78    Pulse: 84     Resp:      Temp:  98.3 F (36.8 C)  98.3 F (36.8 C)  TempSrc:  Oral  Oral  SpO2: 93%  95%   Weight:      Height:        Wt Readings from Last 3 Encounters:  01/11/24 73.9 kg  09/04/23 73.9 kg  08/05/23 75 kg     Intake/Output Summary (Last 24 hours) at 01/13/2024 0738 Last data filed at 01/13/2024 0600 Gross per 24 hour  Intake 1995.5 ml  Output 1100 ml  Net 895.5 ml     Physical Exam  Awake, No new F.N deficits, baseline dysarthria, [place].AT,PERRAL Supple Neck, No JVD,   Symmetrical Chest wall movement, Good air movement bilaterally, bilateral breath sounds RRR,No Gallops,Rubs or new Murmurs,  +ve B.Sounds, Abd Soft, No tenderness,   No Cyanosis, Clubbing or edema       Data Review:    Recent Labs  Lab 01/11/24 0205 01/11/24 0631 01/12/24  9176 01/13/24 0624  WBC 1.6* 0.8* 1.2* 1.5*  HGB 12.9* 11.5* 10.0* 8.7*  HCT 38.5* 34.4* 29.5* 25.5*  PLT 112* 90* 77*  77* 70*  MCV 106.9* 107.5* 107.3* 106.3*  MCH 35.8* 35.9* 36.4* 36.3*  MCHC 33.5 33.4 33.9 34.1  RDW 13.2 13.5 13.7 13.5  LYMPHSABS 0.5*  --  0.3* PENDING  MONOABS 0.1  --  0.0* PENDING  EOSABS 0.0  --  0.0 PENDING  BASOSABS 0.0  --  0.0 PENDING    Recent Labs  Lab 01/11/24 0205 01/11/24 0215 01/11/24 0435 01/11/24 0631 01/12/24 0823  01/12/24 0958 01/13/24 0624  NA 137  --   --   --  138  --  135  K 4.6  --   --   --  3.9  --  3.6  CL 100  --   --   --  101  --  99  CO2 24  --   --   --  24  --  25  ANIONGAP 13  --   --   --  13  --  11  GLUCOSE 145*  --   --   --  127*  --  135*  BUN 16  --   --   --  23  --  11  CREATININE 1.47*  --   --  1.50* 0.86  --  0.82  AST 40  --   --   --  45*  --  37  ALT 49*  --   --   --  38  --  35  ALKPHOS 53  --   --   --  37*  --  35*  BILITOT 0.9  --   --   --  1.0  --  0.8  ALBUMIN 4.2  --   --   --  3.5  --  2.9*  CRP  --   --   --   --  26.9*  --  21.8*  DDIMER  --   --   --   --  3.42*  --   --   PROCALCITON  --   --   --   --  6.16  --   --   LATICACIDVEN  --  4.2* 7.5*  --  2.3* 2.2*  --   INR 1.0  --   --   --  1.2  --   --   MG  --   --   --   --  1.6*  --  1.8  PHOS  --   --   --   --  1.9*  --  2.9  CALCIUM  9.0  --   --   --  9.0  --  8.1*      Recent Labs  Lab 01/11/24 0205 01/11/24 0215 01/11/24 0435 01/12/24 0823 01/12/24 0958 01/13/24 0624  CRP  --   --   --  26.9*  --  21.8*  DDIMER  --   --   --  3.42*  --   --   PROCALCITON  --   --   --  6.16  --   --   LATICACIDVEN  --  4.2* 7.5* 2.3* 2.2*  --   INR 1.0  --   --  1.2  --   --   MG  --   --   --  1.6*  --  1.8  CALCIUM  9.0  --   --  9.0  --  8.1*    --------------------------------------------------------------------------------------------------------------- Lab Results  Component Value Date   CHOL 163 09/03/2022   HDL 50 09/03/2022   LDLCALC 92 09/03/2022   LDLDIRECT 113.0 12/30/2020   TRIG 104 09/03/2022   CHOLHDL 3.3 09/03/2022    Lab Results  Component Value Date   HGBA1C 5.2 09/04/2022   No results for input(s): TSH, T4TOTAL, FREET4, T3FREE, THYROIDAB in the last 72 hours. Recent Labs    01/12/24 0823  VITAMINB12 947*  FOLATE 5.9*  FERRITIN 330  TIBC 227*  IRON 32*  RETICCTPCT 2.1    ------------------------------------------------------------------------------------------------------------------ Cardiac Enzymes No results for input(s): CKMB, TROPONINI, MYOGLOBIN in the last 168 hours.  Invalid input(s): CK  Micro Results Recent Results (from the past 240 hours)  Blood Culture (routine x 2)     Status: None (Preliminary result)   Collection Time: 01/11/24  2:05 AM   Specimen: BLOOD  Result Value Ref Range Status   Specimen Description BLOOD RIGHT ANTECUBITAL  Final   Special Requests   Final    BOTTLES DRAWN AEROBIC AND ANAEROBIC Blood Culture adequate volume   Culture   Final    NO GROWTH 1 DAY Performed at [facility], [address] [place], [place] [address]    Report Status PENDING  Incomplete  Blood Culture (routine x 2)     Status: None (Preliminary result)   Collection Time: 01/11/24  2:25 AM   Specimen: BLOOD  Result Value Ref Range Status   Specimen Description BLOOD BLOOD RIGHT HAND  Final   Special Requests   Final    BOTTLES DRAWN AEROBIC AND ANAEROBIC Blood Culture adequate volume   Culture   Final    NO GROWTH 1 DAY Performed at [facility], [address] [place], [place] [address]    Report Status PENDING  Incomplete  Respiratory (~20 pathogens) panel by PCR     Status: None   Collection Time: 01/12/24  5:49 AM   Specimen: Nasopharyngeal Swab; Respiratory  Result Value Ref Range Status   Adenovirus NOT DETECTED NOT DETECTED Final   Coronavirus 229E NOT DETECTED NOT DETECTED Final    Comment: (NOTE) The Coronavirus on the Respiratory Panel, DOES NOT test for the novel  Coronavirus (2019 nCoV)    Coronavirus HKU1 NOT DETECTED NOT DETECTED Final   Coronavirus NL63 NOT DETECTED NOT DETECTED Final   Coronavirus OC43 NOT DETECTED NOT DETECTED Final   Metapneumovirus NOT DETECTED NOT DETECTED Final   Rhinovirus / Enterovirus NOT DETECTED NOT DETECTED Final   Influenza A NOT DETECTED NOT DETECTED Final    Influenza B NOT DETECTED NOT DETECTED Final   Parainfluenza Virus 1 NOT DETECTED NOT DETECTED Final   Parainfluenza Virus 2 NOT DETECTED NOT DETECTED Final   Parainfluenza Virus 3 NOT DETECTED NOT DETECTED Final   Parainfluenza Virus 4 NOT DETECTED NOT DETECTED Final   Respiratory Syncytial Virus NOT DETECTED NOT DETECTED Final   Bordetella pertussis NOT DETECTED NOT DETECTED Final   Bordetella Parapertussis NOT DETECTED NOT DETECTED Final   Chlamydophila pneumoniae NOT DETECTED NOT DETECTED Final   Mycoplasma pneumoniae NOT DETECTED NOT DETECTED Final    Comment: Performed at  [facility], [address] [place], [place] [address]  SARS Coronavirus 2 by RT PCR (hospital order, performed in [facility] hospital lab) *cepheid single result test* Anterior Nasal Swab     Status: None   Collection Time: 01/12/24  5:50 AM   Specimen: Anterior  Nasal Swab  Result Value Ref Range Status   SARS Coronavirus 2 by RT PCR NEGATIVE NEGATIVE Final    Comment: Performed at [facility], [address] [place], [place] [address]    Radiology Report DG Chest Port 1 View Result Date: 01/12/2024 EXAM: 1 VIEW XRAY OF THE CHEST 01/12/2024 06:08:00 AM COMPARISON: AP radiograph of the chest dated 01/11/2024. CLINICAL HISTORY: [id number] SOB (shortness of breath) [id number]. reason for exam: SOB (shortness of breath) ; Hx of HTN SOB (shortness of breath) ; Hx of HTN FINDINGS: LUNGS AND PLEURA: Mildly prominent interstitial opacities, similar to the prior study. Mild biapical pleural disease. No adverse interval change. HEART AND MEDIASTINUM: The heart is normal in size. BONES AND SOFT TISSUES: Postsurgical deformity of the right fifth rib. Surgical clips are noted in the right hilar region and overlying the apex of the left chest cavity. IMPRESSION: 1. Mildly prominent interstitial opacities, similar to the prior study. 2. Mild biapical pleural disease. Electronically signed by: Evalene Coho MD 01/12/2024 06:30  AM EDT RP Workstation: [id number]   CT CHEST WO CONTRAST Result Date: 01/11/2024 CLINICAL DATA:  Chronic dyspnea a, chest wall or pleural disease suspected. Hypoxic respiratory failure. EXAM: CT CHEST WITHOUT CONTRAST TECHNIQUE: Multidetector CT imaging of the chest was performed following the standard protocol without IV contrast. RADIATION DOSE REDUCTION: This exam was performed according to the departmental dose-optimization program which includes automated exposure control, adjustment of the mA and/or kV according to patient size and/or use of iterative reconstruction technique. COMPARISON:  None Available. FINDINGS: Cardiovascular: The heart is normal in size and there is no pericardial effusion. Multi-vessel coronary artery calcifications are noted. There is atherosclerotic calcification of the aorta a without evidence of aneurysm. The pulmonary trunk is normal in caliber. Mediastinum/Nodes: No mediastinal or axillary lymphadenopathy. Evaluation of the hila is limited due to lack of IV contrast. The thyroid  gland, trachea, and esophagus are within normal limits. Surgical clips are noted in the mediastinum on the right and anterior chest wall on the left. Lungs/Pleura: Pleuroparenchymal scarring is noted at the lung apices bilaterally. Atelectasis is present bilaterally. There is bronchial wall thickening bilaterally with a few areas of mucous plugging and mild atelectasis or infiltrate in the left lower lobe. There is a right lower lobe nodule measuring 1.0 x 0.6 cm, axial image 65. There is a 7 mm subpleural nodule in the right lower lobe, axial image 73. A 3 mm nodule is present in the right lower lobe, axial image 82. There is a 4 mm nodule in the right lower lobe, axial image 80 a. there is a 4 mm nodule in the right lower lobe, axial image 102. There is a 5 mm nodule in the left lower lobe, axial image 91. No effusion or pneumothorax is seen. Upper Abdomen: Fatty infiltration of the liver is noted. No  acute abnormality. Musculoskeletal: Degenerative changes are present in the thoracic spine. There is bony deformity of the ribs on the right, which may be postsurgical or posttraumatic. No acute osseous abnormality is seen. IMPRESSION: 1. Bronchial wall thickening bilaterally with mild multifocal mucous plugging in the lower lobes and atelectasis or infiltrate in the left lower lobe. 2. Scattered pulmonary nodules bilaterally measuring up to 1 cm. Consider one of the following in 3 months for both low-risk and high-risk individuals: (a) repeat chest CT, (b) follow-up PET-CT, or (c) tissue sampling. This recommendation follows the consensus statement: Guidelines for Management of Incidental Pulmonary Nodules Detected on CT Images:  From the Fleischner Society 2017; Radiology 2017; [id number]. 3. Hepatic steatosis. 4. Coronary artery calcifications. 5. Aortic atherosclerosis. Electronically Signed   By: Leita Birmingham M.D.   On: 01/11/2024 17:35     Signature  -   Lavada Stank M.D on 01/13/2024 at 7:38 AM   -  To page go to [email or web address]

## 2024-01-13 NOTE — Consult Note (Signed)
 Palliative Care Consult Note                                  Date: 01/13/2024   Patient Name: William Montgomery  DOB:11-05-1940  FMW:[id number]  Age / Sex:83 y.o., male  PCP: Candise Aleene DEL, MD Referring Physician: Dennise Lavada POUR, MD  Reason for Consultation: Establishing goals of care, Hospice Evaluation, and Non pain symptom management  Past Medical History:  Diagnosis Date   Arthritis    joints/  back/  neck   Chronic headaches    due to cervical spondylosis   Chronic renal insufficiency, stage 2 (mild)    borderlines II/III   Coronary artery disease 1999   cardiologist--- dr lavona---  (per cardiology note pt refused to take asa/ statin) hx PCI w/ stenting in 1999 to LAD and 12-23-2007 x2 DES to proximal LAD;  Cath 03/2017-->  non- critical 70 - 80% mid/distal circ stenosis,  chronic small D1 80% stenosis (stable) and DES to proximal and mid LAD--   Med mgmt rec'd-->nitrates caused HAs, ACE-I caused cough.   Corticobasal syndrome (HCC)    [facility] neurol 04/2023   DDD (degenerative disc disease)    lumbar and cervical neck   Diverticulosis of sigmoid colon 07/25/2011   Severe (endoscopy by Dr. Jakie)   Erectile dysfunction after radical prostatectomy    History of kidney stones    History of prostate cancer 12/2014   urologist-- dr matilda---   s/p Robot assisted radical prostatectomy 07/2015: lymph node involvement//   undetactable PSA since   HTN (hypertension)    ACE-I cough.  Fine on ARB.   Hx of blastomycosis 1978   s/p  right upper lobectomy   Hyperlipidemia    NASH (nonalcoholic steatohepatitis) 06/2011   normal LFTS  /  Hep B and C testing neg.  Mild fatty liver on CT 2020   Nocturia    Parkinsonism (HCC)    2023/24, unknown etiology (Dat scan neg), peripheral nerve bx, and spinal fluid testing all NEG/Norma. [facility] 2nd opinion-->corticobasal syndrome 04/2023)   Pseudobulbar affect    Pulmonary nodules     small, picked up on CT ab/pelv for abd pains/flank pains-->pt quit smoking 40 yrs ago.  Low risk for lung ca so no f/u CT needed.   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   Tinnitus    Wears glasses    Wears hearing aid in both ears     Assessment & Plan:   HPI/Patient Profile: 83 y.o. male  with past medical history of corticobasal syndrome w/ baseline dysarthria, dysphagia, CAD s/p stenting of LAD, prostate cancer s/p prostatectomy, NASH admitted on 01/11/2024 with fever and acute hypoxic respiratory failure 2/2 aspiration. Palliative consulted for goals of care and family was requesting possible hospice placement.    SUMMARY OF RECOMMENDATIONS   DNR - Comfort Limit disturbances Wean oxygen off or lowest flow for respiratory comfort Discontinue MBS order Continue inhaled medications for wheezing and congestion to help with comfort Continue to monitor next 24-48 hours to assess planning for inpatient vs home hospice  Symptom Management:  Morphine  1 mg IV Q1 PRN Ativan  1 mg SL Q4 PRN  Code Status: DNR -  Comfort  Prognosis:  < 6 months  Discharge Planning:  To Be Determined   Discussed with: Dennise MD, Murray PEAK, Cherisse NP   Subjective:   Reviewed medical records, received report from team, assessed the patient and then meet at the patient's bedside to discuss diagnosis, prognosis, GOC, EOL wishes disposition and options.  Before meeting with the patient/family, I spent time reviewing the chart notes. I also reviewed vital signs, nursing flowsheets, medication administrations record, labs, and imaging. Labs/imaging reviewed include CBC, CMP, CT Chest.   I met with: Patient Wife William Montgomery)  Patient with baseline dysarthria making conversation difficult, but wife was able to provide most of the information and  patient goals. Patient remained alert the entire visit.    We meet to discuss diagnosis prognosis, GOC, EOL wishes, disposition and options. Concept of Palliative Care was introduced as specialized medical care for people and their families living with serious illness.  If focuses on providing relief from the symptoms and stress of a serious illness.  The goal is to improve quality of life for both the patient and the family. Values and goals of care important to patient and family were attempted to be elicited.  Created space and opportunity for patient  and family to explore thoughts and feelings regarding current medical situation   Natural trajectory and current clinical status were discussed. Questions and concerns addressed. Patient encouraged to call with questions or concerns.    Patient/Family Understanding of Illness: - Patient and family have a good understanding the overall comorbid diseases including CAD, corticobasal syndrome, and recent aspiration 2/2 dysphagia have resulted in the patient's current state.  Life Review: - Retired Art gallery manager for [employer] - Married for 25 years - Wife is a Engineer, civil (consulting) at [employer], but has been caring for him for many years - Enjoyed traveling with his wife  Patient Values: - Wants to be comfortable, was initially declining to be brought to the hospital and wanted to pass at home before this current admission  Baseline Status: - Before current decline patient was able to move around the house in a powered wheelchair and had some independence during the day when his wife was at work  - 2 months ago wife had to take off from work to care for him 24/7 and is at the limit of her ability to care for him  Today's Discussion: - Educated patient and wife on hospice and full comfort and the expectations and trajectory when full comfort measures are in place and both were accepting of transitioning to shift focus to comfort - Educated that some more  observation will need to be done to help guide placement for patient whether it will be home hospice or if patient is eligible for inpatient hospice  Review of Systems  Unable to perform ROS Respiratory:  Positive for shortness of breath and wheezing.   Genitourinary:  Positive for difficulty urinating.    Objective:   Primary Diagnoses: Present on Admission:  Hypoxic respiratory failure (HCC)  Acute hypoxemic respiratory failure (HCC)   Vital Signs:  BP 133/68 (BP Location: Left Arm)   Pulse 84   Temp 97.8 F (36.6 C) (Tympanic)   Resp 18   Ht 5' 9 (1.753 m)   Wt 73.9 kg   SpO2 95%   BMI 24.06 kg/m   Physical Exam HENT:     Head: Normocephalic.  Eyes:     Extraocular Movements: Extraocular movements intact.  Pulmonary:     Breath sounds:  Wheezing and rhonchi present.  Abdominal:     Palpations: Abdomen is soft.  Neurological:     Mental Status: He is alert. Mental status is at baseline.     Palliative Assessment/Data: 40%   Advanced Care Planning:   Existing Vynca/ACP Documentation: HCPOA, AD, MOST form   Primary Decision Maker: PATIENT, has documented HCPOA - Erminio (wife)  Pertinent diagnosis: Corticobasal syndrome w/ baseline dysarthria, dysphagia, CAD s/p stenting of LAD, prostate cancer s/p prostatectomy, NASH, aspiration pneumonia  The patient and family consented to a voluntary Advance Care Planning Conversation in person. Individuals present for the conversation: Patient, Wife William Montgomery), Cherisse NP, Fairy Kemps PA  Summary of the conversation: Discussed MOST form and possible options  Outcome of the conversations and/or documents completed: MOST form completed  I spent 20 minutes providing separately identifiable ACP services with the patient and/or surrogate decision maker in a voluntary, in-person conversation discussing the patient's wishes and goals as detailed in the above note.  Thank you for allowing us  to participate in the care of SAHID BORBA PMT will continue to support holistically.  Time Total: 75 mins Billing based on MDM: High  Problems Addressed: One acute or chronic illness or injury that poses a threat to life or bodily function  Amount and/or Complexity of Data: Category 1:Review of prior external note(s) from each unique source, Review of the result(s) of each unique test, and Assessment requiring an independent historian(s) and Category 3:Discussion of management or test interpretation with external physician/other qualified health care professional/appropriate source (not separately reported)  Risks: Decision not to resuscitate or to de-escalate care because of poor prognosis  Detailed review of medical records (labs, imaging, vital signs), medically appropriate exam, discussed with treatment team, counseling and education to patient, family, & staff, documenting clinical information, medication management, coordination of care.  Signed by: Fairy FORBES Kemps DEVONNA Palliative Medicine Team  Team Phone # [phone] (Nights/Weekends)  01/13/2024, 10:35 AM

## 2024-01-13 NOTE — Progress Notes (Addendum)
 SLP Cancellation Note  Patient Details Name: William Montgomery MRN: [id number] DOB: [birth year]   Cancelled treatment:        MBSS planned for this morning has been canceled. Pt unable to transport to radiology today.  On further chart review.  Pt is transitioning to comfort care.  SLP orders d/c'd please reconsult if pt has any further ST needs.   Anette FORBES Grippe, MA, CCC-SLP Acute Rehabilitation Services Office: [phone] 01/13/2024, 10:13 AM

## 2024-01-13 NOTE — Plan of Care (Signed)

## 2024-01-14 DIAGNOSIS — J9601 Acute respiratory failure with hypoxia: Secondary | ICD-10-CM | POA: Diagnosis not present

## 2024-01-14 MED ORDER — MORPHINE SULFATE (PF) 2 MG/ML IV SOLN
2.0000 mg | INTRAVENOUS | Status: DC | PRN
  Administered 2024-01-14 (×2): 2 mg via INTRAVENOUS
  Filled 2024-01-14 (×2): qty 1

## 2024-01-14 MED ORDER — LORAZEPAM 2 MG/ML PO CONC: 1.0000 mg | ORAL | Status: DC | PRN

## 2024-01-14 MED ORDER — IPRATROPIUM-ALBUTEROL 0.5-2.5 (3) MG/3ML IN SOLN: 3.0000 mL | RESPIRATORY_TRACT | Status: DC | PRN

## 2024-01-14 MED ORDER — ACETAMINOPHEN 325 MG PO TABS: 650.0000 mg | ORAL_TABLET | Freq: Four times a day (QID) | ORAL | Status: DC | PRN

## 2024-01-14 MED ORDER — GUAIFENESIN-CODEINE 100-10 MG/5ML PO SOLN: 10.0000 mL | Freq: Four times a day (QID) | ORAL | Status: DC | PRN

## 2024-01-14 MED ORDER — IPRATROPIUM-ALBUTEROL 0.5-2.5 (3) MG/3ML IN SOLN
3.0000 mL | Freq: Four times a day (QID) | RESPIRATORY_TRACT | Status: DC
  Administered 2024-01-14 (×2): 3 mL via RESPIRATORY_TRACT
  Filled 2024-01-14 (×2): qty 3

## 2024-01-14 NOTE — Discharge Summary (Signed)
 William Montgomery FMW:[id number] DOB: 10/22/1940 DOA: 01/11/2024  PCP: Candise Aleene DEL, MD  Admit date: 01/11/2024  Discharge date: 01/14/2024  Admitted From: Home   Disposition:  Residential Hospice   Recommendations for Outpatient Follow-up:   Follow up with PCP in 1-2 weeks  PCP Please obtain BMP/CBC, 2 view CXR in 1week,  (see Discharge instructions)   PCP Please follow up on the following pending results:    Disposition.  Residential hospice Condition.  Guarded CODE STATUS.  DNR Activity.  With assistance as tolerated, full fall precautions. Diet.  Soft with feeding assistance and aspiration precautions. Goal of care.  Comfort.    Chief Complaint  Patient presents with   Respiratory Distress     Brief history of present illness  and Hospital Course   83 y.o. male with medical history significant of corticobasal syndrome with baseline dysarthria, dysphagia, extremely poor quality of life at baseline, CAD s/p stenting of LAD on multiple occasions (pt not on aspirin  and statin by choice), prostate cancer s/p radical prostatectomy in 2017, NASH, HTN, remote C6-C7 fusion and chronic pain p/w fever and AHRF 2/2 aspiration. After DW family, pall.care team it was decided that patient would be full comfort care.   This patient is now transition to full comfort care.     Orther issues addressed earlier this admission - now only comfort Meds and o2 for comfort.  Sepsis due to aspiration pneumonia with acute hypoxic respiratory failure.  Now C.care   CAD s/p stenting in the past.  Not on aspirin  or statin per his personal choice, C care now   \Pancytopenia.  Acute on chronic, likely due to sepsis, DIC panel inconclusive, monitor peripheral smear and levels, treat underlying sepsis.   Underlying corticobasal  syndrome, neuro degenerative disease with poor baseline functional status, dysarthria and dysphagia.  Supportive care.   Discharge diagnosis     Principal Problem:   Hypoxic respiratory failure (HCC) Active Problems:   Acute hypoxemic respiratory failure (HCC)   DNR (do not resuscitate)   Palliative care by specialist   Acute respiratory failure with hypoxemia [facility])    Discharge instructions    Discharge Instructions     Discharge instructions   Complete by: As directed    Disposition.  Residential hospice Condition.  Guarded CODE STATUS.  DNR Activity.  With assistance as tolerated, full fall precautions. Diet.  Soft with feeding assistance and aspiration precautions. Goal of care.  Comfort.       Discharge Medications   Allergies as of 01/14/2024       Reactions   Ace Inhibitors Cough   Imdur  [isosorbide  Dinitrate] Other (See Comments)   Headache        Medication List     STOP taking these medications    acetaminophen  650 MG CR tablet Commonly known as: TYLENOL  Replaced by: acetaminophen  325 MG tablet   ALPRAZolam  1 MG tablet Commonly known as: XANAX    Cholecalciferol  50 MCG (2000 UT) Caps   escitalopram  20 MG tablet Commonly known  as: Lexapro    lidocaine  4 %   meloxicam  15 MG tablet Commonly known as: MOBIC    Nuedexta  20-10 MG capsule Generic drug: Dextromethorphan -quiNIDine   oxyCODONE  5 MG immediate release tablet Commonly known as: Oxy IR/ROXICODONE    Theratears 0.25 % Soln Generic drug: Carboxymethylcellulose Sodium       TAKE these medications    acetaminophen  325 MG tablet Commonly known as: TYLENOL  Take 2 tablets (650 mg total) by mouth every 6 (six) hours as needed for fever (Fever >/= 101). Replaces: acetaminophen  650 MG CR tablet   ipratropium-albuterol  0.5-2.5 (3) MG/3ML Soln Commonly known as: DUONEB Take 3 mLs by nebulization every 4 (four) hours as needed.   LORazepam  2 MG/ML concentrated solution Commonly known  as: ATIVAN  Place 0.5 mLs (1 mg total) under the tongue every 4 (four) hours as needed for anxiety.          Major procedures and Radiology Reports - PLEASE review detailed and final reports thoroughly  -     DG Chest Port 1 View Result Date: 01/12/2024 EXAM: 1 VIEW XRAY OF THE CHEST 01/12/2024 06:08:00 AM COMPARISON: AP radiograph of the chest dated 01/11/2024. CLINICAL HISTORY: 141880 SOB (shortness of breath) 141880. reason for exam: SOB (shortness of breath) ; Hx of HTN SOB (shortness of breath) ; Hx of HTN FINDINGS: LUNGS AND PLEURA: Mildly prominent interstitial opacities, similar to the prior study. Mild biapical pleural disease. No adverse interval change. HEART AND MEDIASTINUM: The heart is normal in size. BONES AND SOFT TISSUES: Postsurgical deformity of the right fifth rib. Surgical clips are noted in the right hilar region and overlying the apex of the left chest cavity. IMPRESSION: 1. Mildly prominent interstitial opacities, similar to the prior study. 2. Mild biapical pleural disease. Electronically signed by: Evalene Coho MD 01/12/2024 06:30 AM EDT RP Workstation: [id number]   CT CHEST WO CONTRAST Result Date: 01/11/2024 CLINICAL DATA:  Chronic dyspnea a, chest wall or pleural disease suspected. Hypoxic respiratory failure. EXAM: CT CHEST WITHOUT CONTRAST TECHNIQUE: Multidetector CT imaging of the chest was performed following the standard protocol without IV contrast. RADIATION DOSE REDUCTION: This exam was performed according to the departmental dose-optimization program which includes automated exposure control, adjustment of the mA and/or kV according to patient size and/or use of iterative reconstruction technique. COMPARISON:  None Available. FINDINGS: Cardiovascular: The heart is normal in size and there is no pericardial effusion. Multi-vessel coronary artery calcifications are noted. There is atherosclerotic calcification of the aorta a without evidence of aneurysm. The  pulmonary trunk is normal in caliber. Mediastinum/Nodes: No mediastinal or axillary lymphadenopathy. Evaluation of the hila is limited due to lack of IV contrast. The thyroid  gland, trachea, and esophagus are within normal limits. Surgical clips are noted in the mediastinum on the right and anterior chest wall on the left. Lungs/Pleura: Pleuroparenchymal scarring is noted at the lung apices bilaterally. Atelectasis is present bilaterally. There is bronchial wall thickening bilaterally with a few areas of mucous plugging and mild atelectasis or infiltrate in the left lower lobe. There is a right lower lobe nodule measuring 1.0 x 0.6 cm, axial image 65. There is a 7 mm subpleural nodule in the right lower lobe, axial image 73. A 3 mm nodule is present in the right lower lobe, axial image 82. There is a 4 mm nodule in the right lower lobe, axial image 80 a. there is a 4 mm nodule in the right lower lobe, axial image 102. There is a 5 mm nodule in the  left lower lobe, axial image 91. No effusion or pneumothorax is seen. Upper Abdomen: Fatty infiltration of the liver is noted. No acute abnormality. Musculoskeletal: Degenerative changes are present in the thoracic spine. There is bony deformity of the ribs on the right, which may be postsurgical or posttraumatic. No acute osseous abnormality is seen. IMPRESSION: 1. Bronchial wall thickening bilaterally with mild multifocal mucous plugging in the lower lobes and atelectasis or infiltrate in the left lower lobe. 2. Scattered pulmonary nodules bilaterally measuring up to 1 cm. Consider one of the following in 3 months for both low-risk and high-risk individuals: (a) repeat chest CT, (b) follow-up PET-CT, or (c) tissue sampling. This recommendation follows the consensus statement: Guidelines for Management of Incidental Pulmonary Nodules Detected on CT Images: From the Fleischner Society 2017; Radiology 2017; 284:228-243. 3. Hepatic steatosis. 4. Coronary artery  calcifications. 5. Aortic atherosclerosis. Electronically Signed   By: Leita Birmingham M.D.   On: 01/11/2024 17:35   DG Chest Port 1 View Result Date: 01/11/2024 EXAM: 1 VIEW(S) XRAY OF THE CHEST 01/11/2024 02:16:00 AM COMPARISON: Comparison with chest radiograph 05/30/2017. CLINICAL HISTORY: Questionable sepsis - evaluate for abnormality. Respiratory distress. FINDINGS: LUNGS AND PLEURA: The lungs are clear. No pleural effusion. No pneumothorax. HEART AND MEDIASTINUM: Stable cardiomediastinal silhouette. BONES AND SOFT TISSUES: Postsurgical changes about the right hilum. Surgical clips project over the left upper chest. IMPRESSION: 1. No acute cardiopulmonary process. Electronically signed by: Norman Gatlin MD 01/11/2024 02:43 AM EDT RP Workstation: [id number]    Micro Results    Recent Results (from the past 240 hours)  Blood Culture (routine x 2)     Status: None (Preliminary result)   Collection Time: 01/11/24  2:05 AM   Specimen: BLOOD  Result Value Ref Range Status   Specimen Description BLOOD RIGHT ANTECUBITAL  Final   Special Requests   Final    BOTTLES DRAWN AEROBIC AND ANAEROBIC Blood Culture adequate volume   Culture   Final    NO GROWTH 3 DAYS Performed at [facility], [address], [place], [place] [address]    Report Status PENDING  Incomplete  Blood Culture (routine x 2)     Status: None (Preliminary result)   Collection Time: 01/11/24  2:25 AM   Specimen: BLOOD  Result Value Ref Range Status   Specimen Description BLOOD BLOOD RIGHT HAND  Final   Special Requests   Final    BOTTLES DRAWN AEROBIC AND ANAEROBIC Blood Culture adequate volume   Culture   Final    NO GROWTH 3 DAYS Performed at [facility], [address], [place], [place] [address]    Report Status PENDING  Incomplete  Respiratory (~20 pathogens) panel by PCR     Status: None   Collection Time: 01/12/24  5:49 AM   Specimen: Nasopharyngeal Swab; Respiratory  Result Value Ref Range Status    Adenovirus NOT DETECTED NOT DETECTED Final   Coronavirus 229E NOT DETECTED NOT DETECTED Final    Comment: (NOTE) The Coronavirus on the Respiratory Panel, DOES NOT test for the novel  Coronavirus (2019 nCoV)    Coronavirus HKU1 NOT DETECTED NOT DETECTED Final   Coronavirus NL63 NOT DETECTED NOT DETECTED Final   Coronavirus OC43 NOT DETECTED NOT DETECTED Final   Metapneumovirus NOT DETECTED NOT DETECTED Final   Rhinovirus / Enterovirus NOT DETECTED NOT DETECTED Final   Influenza A NOT DETECTED NOT DETECTED Final   Influenza B NOT DETECTED NOT DETECTED Final   Parainfluenza Virus  1 NOT DETECTED NOT DETECTED Final   Parainfluenza Virus 2 NOT DETECTED NOT DETECTED Final   Parainfluenza Virus 3 NOT DETECTED NOT DETECTED Final   Parainfluenza Virus 4 NOT DETECTED NOT DETECTED Final   Respiratory Syncytial Virus NOT DETECTED NOT DETECTED Final   Bordetella pertussis NOT DETECTED NOT DETECTED Final   Bordetella Parapertussis NOT DETECTED NOT DETECTED Final   Chlamydophila pneumoniae NOT DETECTED NOT DETECTED Final   Mycoplasma pneumoniae NOT DETECTED NOT DETECTED Final    Comment: Performed at [facility], [address], [place], [place] [address]  SARS Coronavirus 2 by RT PCR (hospital order, performed in [facility] hospital lab) *cepheid single result test* Anterior Nasal Swab     Status: None   Collection Time: 01/12/24  5:50 AM   Specimen: Anterior Nasal Swab  Result Value Ref Range Status   SARS Coronavirus 2 by RT PCR NEGATIVE NEGATIVE Final    Comment: Performed at [facility], [address], [place], [place] [address]    Today   Subjective    William Montgomery today has no headache,no chest abdominal pain,no new weakness tingling or numbness, feels much better wants to go home today.    Objective   Blood pressure 128/71, pulse 68, temperature 97.9 F (36.6 C), temperature source Oral, resp. rate 20, height 5' 9 (1.753 m), weight 73.9 kg, SpO2  97%.   Intake/Output Summary (Last 24 hours) at 01/14/2024 1430 Last data filed at 01/14/2024 1220 Gross per 24 hour  Intake --  Output 2700 ml  Net -2700 ml    Exam  Awake Alert, No new F.N deficits,baseline dysarthria  [place].AT,PERRAL Supple Neck,   Symmetrical Chest wall movement, Good air movement bilaterally, Coarse b sounds RRR,No Gallops,   +ve B.Sounds, Abd Soft, Non tender,  No Cyanosis, Clubbing or edema    Data Review   Recent Labs  Lab 01/11/24 0205 01/11/24 0631 01/12/24 0823 01/13/24 0624  WBC 1.6* 0.8* 1.2* 1.5*  HGB 12.9* 11.5* 10.0* 8.7*  HCT 38.5* 34.4* 29.5* 25.5*  PLT 112* 90* 77*  77* 70*  MCV 106.9* 107.5* 107.3* 106.3*  MCH 35.8* 35.9* 36.4* 36.3*  MCHC 33.5 33.4 33.9 34.1  RDW 13.2 13.5 13.7 13.5  LYMPHSABS 0.5*  --  0.3* 0.4*  MONOABS 0.1  --  0.0* 0.0*  EOSABS 0.0  --  0.0 0.1  BASOSABS 0.0  --  0.0 0.0    Recent Labs  Lab 01/11/24 0205 01/11/24 0215 01/11/24 0435 01/11/24 0631 01/12/24 0823 01/12/24 0958 01/13/24 0624  NA 137  --   --   --  138  --  135  K 4.6  --   --   --  3.9  --  3.6  CL 100  --   --   --  101  --  99  CO2 24  --   --   --  24  --  25  ANIONGAP 13  --   --   --  13  --  11  GLUCOSE 145*  --   --   --  127*  --  135*  BUN 16  --   --   --  23  --  11  CREATININE 1.47*  --   --  1.50* 0.86  --  0.82  AST 40  --   --   --  45*  --  37  ALT 49*  --   --   --  38  --  35  ALKPHOS 53  --   --   --  37*  --  35*  BILITOT 0.9  --   --   --  1.0  --  0.8  ALBUMIN 4.2  --   --   --  3.5  --  2.9*  CRP  --   --   --   --  26.9*  --  21.8*  DDIMER  --   --   --   --  3.42*  --   --   PROCALCITON  --   --   --   --  6.16  --  3.90  LATICACIDVEN  --  4.2* 7.5*  --  2.3* 2.2*  --   INR 1.0  --   --   --  1.2  --   --   MG  --   --   --   --  1.6*  --  1.8  PHOS  --   --   --   --  1.9*  --  2.9  CALCIUM  9.0  --   --   --  9.0  --  8.1*    Total Time in preparing paper work, data evaluation and todays exam - 35  minutes  Signature  -    Lavada Stank M.D on 01/14/2024 at 2:30 PM   -  To page go to [email or web address]

## 2024-01-14 NOTE — Progress Notes (Signed)
 Daily Progress Note   Date: 01/14/2024   Patient Name: KONGMENG SANTORO  DOB: 1940/11/25  MRN: [id number]  Age / Sex: 83 y.o., male  Attending Physician: Dennise Lavada POUR, MD Primary Care Physician: Candise Aleene DEL, MD Admit Date: 01/11/2024 Length of Stay: 3 days  Reason for Follow-up: Establishing goals of care, Hospice Evaluation, and Non pain symptom management  Past Medical History:  Diagnosis Date   Arthritis    joints/  back/  neck   Chronic headaches    due to cervical spondylosis   Chronic renal insufficiency, stage 2 (mild)    borderlines II/III   Coronary artery disease 1999   cardiologist--- dr lavona---  (per cardiology note pt refused to take asa/ statin) hx PCI w/ stenting in 1999 to LAD and 12-23-2007 x2 DES to proximal LAD;  Cath 03/2017-->  non- critical 70 - 80% mid/distal circ stenosis,  chronic small D1 80% stenosis (stable) and DES to proximal and mid LAD--   Med mgmt rec'd-->nitrates caused HAs, ACE-I caused cough.   Corticobasal syndrome (HCC)    [facility] neurol 04/2023   DDD (degenerative disc disease)    lumbar and cervical neck   Diverticulosis of sigmoid colon 07/25/2011   Severe (endoscopy by Dr. Jakie)   Erectile dysfunction after radical prostatectomy    History of kidney stones    History of prostate cancer 12/2014   urologist-- dr matilda---   s/p Robot assisted radical prostatectomy 07/2015: lymph node involvement//   undetactable PSA since   HTN (hypertension)    ACE-I cough.  Fine on ARB.   Hx of blastomycosis 1978   s/p  right upper lobectomy   Hyperlipidemia    NASH (nonalcoholic steatohepatitis) 06/2011   normal LFTS  /  Hep B and C testing neg.  Mild fatty liver on CT 2020   Nocturia    Parkinsonism (HCC)    2023/24, unknown etiology (Dat scan neg), peripheral nerve bx, and spinal fluid testing all NEG/Norma. [facility] 2nd opinion-->corticobasal syndrome 04/2023)   Pseudobulbar affect    Pulmonary nodules    small, picked up on CT  ab/pelv for abd pains/flank pains-->pt quit smoking 40 yrs ago.  Low risk for lung ca so no f/u CT needed.   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   Tinnitus    Wears glasses    Wears hearing aid in both ears     Assessment & Plan:   HPI/Patient Profile:  83 y.o. male  with past medical history of corticobasal syndrome w/ baseline dysarthria, dysphagia, CAD s/p stenting of LAD, prostate cancer s/p prostatectomy, NASH admitted on 01/11/2024 with fever and acute hypoxic respiratory failure 2/2 aspiration. Palliative consulted for goals of care and family was requesting possible hospice placement.    SUMMARY OF RECOMMENDATIONS  DNR - comfort Limit disturbances Wean oxygen off or lowest flow for respiratory comfort Await inpatient hospice bed  Symptom Management:  Morphine  1 mg IV Q1 PRN Ativan  1 mg SL Q4 PRN  Code Status: DNR - Comfort  Prognosis: < 6 weeks  Discharge Planning: Hospice facility  Discussed with: Dennise MD, Sherlynn PEAK, Erminio (wife)   Subjective:   Subjective: Chart Reviewed. Updates received. Patient Assessed. Created space and opportunity for patient  and family to explore thoughts  and feelings regarding current medical situation.  Today's Discussion: Today before meeting with the patient/family, I reviewed the chart notes. I also reviewed vital signs, nursing flowsheets, medication administrations record, labs, and imaging. Labs reviewed include CBC, CMP. - Patient and wife were comfortable when visited. Was able to wean patient down to 4 L/min oxygen, and encouraged RN to continue to wean as able, but okay to continue with some oxygen for comfort as patient was complaining of shortness of breath, especially when laying down to be cleaned  Review of Systems  Unable to perform ROS   Objective:    Primary Diagnoses: Present on Admission:  Hypoxic respiratory failure (HCC)  Acute hypoxemic respiratory failure (HCC)   Vital Signs:  BP 129/67 (BP Location: Right Arm)   Pulse 68   Temp 98.8 F (37.1 C) (Oral)   Resp 18   Ht 5' 9 (1.753 m)   Wt 73.9 kg   SpO2 97%   BMI 24.06 kg/m   Physical Exam HENT:     Nose: Nose normal.  Eyes:     Extraocular Movements: Extraocular movements intact.  Pulmonary:     Breath sounds: Wheezing and rhonchi present.  Abdominal:     Palpations: Abdomen is soft.  Neurological:     Mental Status: He is alert. Mental status is at baseline.  Psychiatric:        Mood and Affect: Mood normal.     Palliative Assessment/Data: 30%   Existing Vynca/ACP Documentation: HCPOA, AD, MOST form  Thank you for allowing us  to participate in the care of ANGEL WEEDON PMT will continue to support holistically.  Time Total: 30 minutes Billing based on MDM: Moderate  Problems Addressed: One acute or chronic illness or injury that poses a threat to life or bodily function  Amount and/or Complexity of Data: Category 1:Review of prior external note(s) from each unique source, Review of the result(s) of each unique test, and Assessment requiring an independent historian(s) and Category 3:Discussion of management or test interpretation with external physician/other qualified health care professional/appropriate source (not separately reported)  Risks: Parenteral controlled substances and Decision not to resuscitate or to de-escalate care because of poor prognosis   Detailed review of medical records (labs, imaging, vital signs), medically appropriate exam, discussed with treatment team, counseling and education to patient, family, & staff, documenting clinical information, medication management, coordination of care  Kayona Foor E Hillari Zumwalt, PA-C  Palliative Medicine Team  Team Phone # [phone] (Nights/Weekends) 01/14/2024 4:42 PM

## 2024-01-14 NOTE — Plan of Care (Signed)
  Problem: Coping: Goal: Ability to identify and develop effective coping behavior will improve Outcome: Progressing   Problem: Clinical Measurements: Goal: Quality of life will improve Outcome: Progressing

## 2024-01-14 NOTE — Discharge Instructions (Signed)
Disposition.  Residential hospice °Condition.  Guarded °CODE STATUS.  DNR °Activity.  With assistance as tolerated, full fall precautions. °Diet.  Soft with feeding assistance and aspiration precautions. °Goal of care.  Comfort. ° °

## 2024-01-14 NOTE — Progress Notes (Signed)
 PROGRESS NOTE                                                                                                                                                                                                             Patient Demographics:    William Montgomery, is a 83 y.o. male, DOB - [birth year], FMW:[id number]  Outpatient Primary MD for the patient is McGowen, Aleene DEL, MD    LOS - 3  Admit date - 01/11/2024    Chief Complaint  Patient presents with   Respiratory Distress       Brief Narrative (HPI from H&P)   83 y.o. male with medical history significant of corticobasal syndrome with baseline dysarthria, dysphagia, extremely poor quality of life at baseline, CAD s/p stenting of LAD on multiple occasions (pt not on aspirin  and statin by choice), prostate cancer s/p radical prostatectomy in 2017, NASH, HTN, remote C6-C7 fusion and chronic pain p/w fever and AHRF 2/2 aspiration.    Subjective:   Patient in bed, denies any headache or chest pain, baseline dysarthria and dysphagia   Assessment  & Plan :   This patient is now transition to full comfort care.    Sepsis due to aspiration pneumonia with acute hypoxic respiratory failure.  This is due to nausea vomiting and possibly ongoing aspiration, has been placed on empiric antibiotics, speech therapy to evaluate, hydrate, clinically improving, follow cultures.  Now followed by palliative care, full comfort care, looking for possible hospice placement.  CAD s/p stenting in the past.  Not on aspirin  or statin per his personal choice, continue to monitor with supportive care  Hypomagnesemia, hypophosphatemia.  Replaced.  Pancytopenia.  Acute on chronic, likely due to sepsis, DIC panel inconclusive, monitor peripheral smear and levels, treat underlying sepsis.  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  Supportive care.      Condition - Extremely Guarded  Family Communication  : Wife bedside on 01/12/2024  Code Status : DNR  Consults  : Pall care  PUD Prophylaxis : PPI   Procedures  :     CT - 1. Bronchial wall thickening bilaterally with mild multifocal mucous plugging in the lower lobes and atelectasis or infiltrate in the left lower lobe. 2. Scattered pulmonary nodules bilaterally measuring up to 1 cm. Consider  one of the following in 3 months for both low-risk and high-risk individuals: (a) repeat chest CT, (b) follow-up PET-CT, or (c) tissue sampling. This recommendation follows the consensus statement: Guidelines for Management of Incidental Pulmonary Nodules Detected on CT Images: From the Fleischner Society 2017; Radiology 2017; 284:228-243. 3. Hepatic steatosis. 4. Coronary artery calcifications. 5. Aortic atherosclerosis.       Disposition Plan  :    Status is: Inpatient   DVT Prophylaxis  :        Lab Results  Component Value Date   PLT 70 (L) 01/13/2024    Diet :  Diet Order             Diet regular Room service appropriate? Yes; Fluid consistency: Thin  Diet effective now                    Inpatient Medications  Scheduled Meds:  escitalopram   20 mg Oral Daily   ipratropium-albuterol   3 mL Nebulization Q6H   pantoprazole   40 mg Oral Daily   Continuous Infusions:   PRN Meds:.acetaminophen  **OR** acetaminophen , artificial tears, artificial tears, haloperidol  lactate, ipratropium-albuterol , LORazepam , morphine  injection, [DISCONTINUED] ondansetron  **OR** ondansetron  (ZOFRAN ) IV  Antibiotics  :    Anti-infectives (From admission, onward)    Start     Dose/Rate Route Frequency Ordered Stop   01/12/24 1400  piperacillin -tazobactam (ZOSYN ) IVPB 3.375 g  Status:  Discontinued        3.375 g 12.5 mL/hr over 240 Minutes Intravenous Every 8 hours 01/12/24 0948 01/13/24 1014   01/11/24 2200  cefTRIAXone  (ROCEPHIN ) 1 g in sodium chloride   0.9 % 100 mL IVPB  Status:  Discontinued        1 g 200 mL/hr over 30 Minutes Intravenous Every 24 hours 01/11/24 0630 01/12/24 0936   01/11/24 1000  azithromycin  (ZITHROMAX ) 500 mg in sodium chloride  0.9 % 250 mL IVPB  Status:  Discontinued        500 mg 250 mL/hr over 60 Minutes Intravenous Every 24 hours 01/11/24 0630 01/13/24 1014   01/11/24 0215  cefTRIAXone  (ROCEPHIN ) 2 g in sodium chloride  0.9 % 100 mL IVPB        2 g 200 mL/hr over 30 Minutes Intravenous  Once 01/11/24 0205 01/11/24 0324         Objective:   Vitals:   01/13/24 1522 01/13/24 2034 01/14/24 0807 01/14/24 0813  BP: 132/71 (!) 151/80  137/71  Pulse:  79 67   Resp: 18 20 20 20   Temp: 97.9 F (36.6 C) 98.9 F (37.2 C)  98.9 F (37.2 C)  TempSrc: Oral Oral  Axillary  SpO2:  95% 93%   Weight:      Height:        Wt Readings from Last 3 Encounters:  01/11/24 73.9 kg  09/04/23 73.9 kg  08/05/23 75 kg     Intake/Output Summary (Last 24 hours) at 01/14/2024 0904 Last data filed at 01/14/2024 0708 Gross per 24 hour  Intake --  Output 2800 ml  Net -2800 ml     Physical Exam  Awake, No new F.N deficits, baseline dysarthria, [place].AT,PERRAL Supple Neck, No JVD,   Symmetrical Chest wall movement, Good air movement bilaterally, bilateral breath sounds RRR,No Gallops,Rubs or new Murmurs,  +ve B.Sounds, Abd Soft, No tenderness,   No Cyanosis, Clubbing or edema       Data Review:    Recent Labs  Lab 01/11/24 0205 01/11/24 0631 01/12/24 9176 01/13/24 9375  WBC 1.6* 0.8* 1.2* 1.5*  HGB 12.9* 11.5* 10.0* 8.7*  HCT 38.5* 34.4* 29.5* 25.5*  PLT 112* 90* 77*  77* 70*  MCV 106.9* 107.5* 107.3* 106.3*  MCH 35.8* 35.9* 36.4* 36.3*  MCHC 33.5 33.4 33.9 34.1  RDW 13.2 13.5 13.7 13.5  LYMPHSABS 0.5*  --  0.3* 0.4*  MONOABS 0.1  --  0.0* 0.0*  EOSABS 0.0  --  0.0 0.1  BASOSABS 0.0  --  0.0 0.0    Recent Labs  Lab 01/11/24 0205 01/11/24 0215 01/11/24 0435 01/11/24 0631 01/12/24 0823 01/12/24 0958  01/13/24 0624  NA 137  --   --   --  138  --  135  K 4.6  --   --   --  3.9  --  3.6  CL 100  --   --   --  101  --  99  CO2 24  --   --   --  24  --  25  ANIONGAP 13  --   --   --  13  --  11  GLUCOSE 145*  --   --   --  127*  --  135*  BUN 16  --   --   --  23  --  11  CREATININE 1.47*  --   --  1.50* 0.86  --  0.82  AST 40  --   --   --  45*  --  37  ALT 49*  --   --   --  38  --  35  ALKPHOS 53  --   --   --  37*  --  35*  BILITOT 0.9  --   --   --  1.0  --  0.8  ALBUMIN 4.2  --   --   --  3.5  --  2.9*  CRP  --   --   --   --  26.9*  --  21.8*  DDIMER  --   --   --   --  3.42*  --   --   PROCALCITON  --   --   --   --  6.16  --  3.90  LATICACIDVEN  --  4.2* 7.5*  --  2.3* 2.2*  --   INR 1.0  --   --   --  1.2  --   --   MG  --   --   --   --  1.6*  --  1.8  PHOS  --   --   --   --  1.9*  --  2.9  CALCIUM  9.0  --   --   --  9.0  --  8.1*      Recent Labs  Lab 01/11/24 0205 01/11/24 0215 01/11/24 0435 01/12/24 0823 01/12/24 0958 01/13/24 0624  CRP  --   --   --  26.9*  --  21.8*  DDIMER  --   --   --  3.42*  --   --   PROCALCITON  --   --   --  6.16  --  3.90  LATICACIDVEN  --  4.2* 7.5* 2.3* 2.2*  --   INR 1.0  --   --  1.2  --   --   MG  --   --   --  1.6*  --  1.8  CALCIUM  9.0  --   --  9.0  --  8.1*    ---------------------------------------------------------------------------------------------------------------  Lab Results  Component Value Date   CHOL 163 09/03/2022   HDL 50 09/03/2022   LDLCALC 92 09/03/2022   LDLDIRECT 113.0 12/30/2020   TRIG 104 09/03/2022   CHOLHDL 3.3 09/03/2022    Lab Results  Component Value Date   HGBA1C 5.2 09/04/2022   No results for input(s): TSH, T4TOTAL, FREET4, T3FREE, THYROIDAB in the last 72 hours. Recent Labs    01/12/24 0823  VITAMINB12 947*  FOLATE 5.9*  FERRITIN 330  TIBC 227*  IRON 32*  RETICCTPCT 2.1    ------------------------------------------------------------------------------------------------------------------ Cardiac Enzymes No results for input(s): CKMB, TROPONINI, MYOGLOBIN in the last 168 hours.  Invalid input(s): CK  Micro Results Recent Results (from the past 240 hours)  Blood Culture (routine x 2)     Status: None (Preliminary result)   Collection Time: 01/11/24  2:05 AM   Specimen: BLOOD  Result Value Ref Range Status   Specimen Description BLOOD RIGHT ANTECUBITAL  Final   Special Requests   Final    BOTTLES DRAWN AEROBIC AND ANAEROBIC Blood Culture adequate volume   Culture   Final    NO GROWTH 2 DAYS Performed at [facility], [address] [place], [place] [address]    Report Status PENDING  Incomplete  Blood Culture (routine x 2)     Status: None (Preliminary result)   Collection Time: 01/11/24  2:25 AM   Specimen: BLOOD  Result Value Ref Range Status   Specimen Description BLOOD BLOOD RIGHT HAND  Final   Special Requests   Final    BOTTLES DRAWN AEROBIC AND ANAEROBIC Blood Culture adequate volume   Culture   Final    NO GROWTH 2 DAYS Performed at [facility], [address] [place], [place] [address]    Report Status PENDING  Incomplete  Respiratory (~20 pathogens) panel by PCR     Status: None   Collection Time: 01/12/24  5:49 AM   Specimen: Nasopharyngeal Swab; Respiratory  Result Value Ref Range Status   Adenovirus NOT DETECTED NOT DETECTED Final   Coronavirus 229E NOT DETECTED NOT DETECTED Final    Comment: (NOTE) The Coronavirus on the Respiratory Panel, DOES NOT test for the novel  Coronavirus (2019 nCoV)    Coronavirus HKU1 NOT DETECTED NOT DETECTED Final   Coronavirus NL63 NOT DETECTED NOT DETECTED Final   Coronavirus OC43 NOT DETECTED NOT DETECTED Final   Metapneumovirus NOT DETECTED NOT DETECTED Final   Rhinovirus / Enterovirus NOT DETECTED NOT DETECTED Final   Influenza A NOT DETECTED NOT DETECTED Final    Influenza B NOT DETECTED NOT DETECTED Final   Parainfluenza Virus 1 NOT DETECTED NOT DETECTED Final   Parainfluenza Virus 2 NOT DETECTED NOT DETECTED Final   Parainfluenza Virus 3 NOT DETECTED NOT DETECTED Final   Parainfluenza Virus 4 NOT DETECTED NOT DETECTED Final   Respiratory Syncytial Virus NOT DETECTED NOT DETECTED Final   Bordetella pertussis NOT DETECTED NOT DETECTED Final   Bordetella Parapertussis NOT DETECTED NOT DETECTED Final   Chlamydophila pneumoniae NOT DETECTED NOT DETECTED Final   Mycoplasma pneumoniae NOT DETECTED NOT DETECTED Final    Comment: Performed at [facility], [address] [place], [place] [address]  SARS Coronavirus 2 by RT PCR (hospital order, performed in [facility] hospital lab) *cepheid single result test* Anterior Nasal Swab     Status: None   Collection Time: 01/12/24  5:50 AM   Specimen: Anterior Nasal Swab  Result Value Ref Range Status  SARS Coronavirus 2 by RT PCR NEGATIVE NEGATIVE Final    Comment: Performed at [facility], [address], [place], [place] [address]    Radiology Report No results found.    Signature  -   Lavada Stank M.D on 01/14/2024 at 9:04 AM   -  To page go to [email or web address]

## 2024-01-14 NOTE — TOC Progression Note (Addendum)
 Transition of Care [facility]) - Progression Note    Patient Details  Name: William Montgomery MRN: [id number] Date of Birth: March 27, 1941  Transition [facility]) CM/SW Contact  Inocente GORMAN Kindle, LCSW Phone Number: 01/14/2024, 10:28 AM  Clinical Narrative:    10:28am-CSW received consult for Hospice placement. CSW spoke with patient's wife and discussed choice. She is requesting [facility] as first choice but is open to other facilities if they are unable to accept patient. CSW sent referral to [facility] with [facility] for review. Spouse to return to the hospital around 12:30pm.   2:12 PM-[facility] has a bed for patient today and spouse is completing consents. Updated MD.    Expected Discharge Plan: Hospice Medical Facility Barriers to Discharge: Hospice Bed not available               Expected Discharge Plan and Services In-house Referral: Clinical Social Work, Hospice / Palliative Care   Post Acute Care Choice: Hospice Living arrangements for the past 2 months: Single Family Home                                       Social Drivers of Health (SDOH) Interventions SDOH Screenings   Food Insecurity: Patient Unable To Answer (01/11/2024)  Housing: Unknown (01/11/2024)  Transportation Needs: Patient Unable To Answer (01/11/2024)  Utilities: Patient Unable To Answer (01/11/2024)  Alcohol  Screen: Low Risk  (09/18/2023)  Depression (PHQ2-9): High Risk (09/18/2023)  Financial Resource Strain: Low Risk  (11/05/2023)  Physical Activity: Inactive (11/05/2023)  Social Connections: Patient Unable To Answer (01/11/2024)  Stress: Stress Concern Present (11/05/2023)  Tobacco Use: Medium Risk (01/11/2024)  Health Literacy: Adequate Health Literacy (09/18/2023)    Readmission Risk Interventions     No data to display

## 2024-01-14 NOTE — TOC Transition Note (Signed)
 Transition of Care [facility]) - Discharge Note   Patient Details  Name: William Montgomery MRN: [id number] Date of Birth: 06/22/1940  Transition of [facility]) CM/SW Contact:  Inocente GORMAN Kindle, LCSW Phone Number: 01/14/2024, 3:39 PM   Clinical Narrative:    Patient will DC to: [facility] Anticipated DC date: 01/14/24 Family notified: Spouse Transport by: [facility]   Per MD patient ready for DC to [facility]. RN to call report prior to discharge ([phone]). RN, patient, patient's family, and facility notified of DC. Discharge Summary sent to facility. DC packet on chart including signed DNR. Ambulance transport requested for patient.   CSW will sign off for now as social work intervention is no longer needed. Please consult us  again if new needs arise.     Final next level of care: Hospice Medical Facility Barriers to Discharge: Barriers Resolved   Patient Goals and CMS Choice Patient states their goals for this hospitalization and ongoing recovery are:: Comfort CMS Medicare.gov Compare Post Acute Care list provided to:: Patient Choice offered to / list presented to : Patient, Spouse [place] ownership interest in [facility].provided to:: Spouse    Discharge Placement              Patient chooses bed at:  [facility]) Patient to be transferred to facility by: [facility] Name of family member notified: Spouse Patient and family notified of of transfer: 01/14/24  Discharge Plan and Services Additional resources added to the After Visit Summary for   In-house Referral: Clinical Social Work, Hospice / Palliative Care   Post Acute Care Choice: Hospice                               Social Drivers of Health (SDOH) Interventions SDOH Screenings   Food Insecurity: Patient Unable To Answer (01/11/2024)  Housing: Unknown (01/11/2024)  Transportation Needs: Patient Unable To Answer (01/11/2024)  Utilities: Patient Unable To Answer (01/11/2024)  Alcohol  Screen: Low  Risk  (09/18/2023)  Depression (PHQ2-9): High Risk (09/18/2023)  Financial Resource Strain: Low Risk  (11/05/2023)  Physical Activity: Inactive (11/05/2023)  Social Connections: Patient Unable To Answer (01/11/2024)  Stress: Stress Concern Present (11/05/2023)  Tobacco Use: Medium Risk (01/11/2024)  Health Literacy: Adequate Health Literacy (09/18/2023)     Readmission Risk Interventions     No data to display

## 2024-01-14 NOTE — Progress Notes (Signed)
Patient discharged to hospice.

## 2024-01-14 NOTE — Plan of Care (Signed)

## 2024-01-14 NOTE — Progress Notes (Signed)
 Report called to Kirkpatrick at hospice.

## 2024-01-16 LAB — CULTURE, BLOOD (ROUTINE X 2)
Culture: NO GROWTH
Culture: NO GROWTH
Special Requests: ADEQUATE
Special Requests: ADEQUATE

## 2024-01-21 ENCOUNTER — Telehealth: Payer: Self-pay

## 2024-01-21 NOTE — Telephone Encounter (Signed)
 FYI  Please see below

## 2024-01-21 NOTE — Telephone Encounter (Signed)
 noted

## 2024-01-21 NOTE — Telephone Encounter (Signed)
 Patient comments: Hi! Please cancel 10/22 appointment.  [name] moved into inpatient hospice at [facility] on 9/9. [name] thought so much of Dr. Candise and he was the only doctor he wanted to see at the end. I will notify you when [name] passes.Thank you!Erminio, [name]'s wife.

## 2024-01-27 NOTE — Telephone Encounter (Signed)
 FYI  Please see below

## 2024-01-27 NOTE — Telephone Encounter (Signed)
 Pls start sympathy card if you haven't already

## 2024-01-28 NOTE — Telephone Encounter (Signed)
Card started

## 2024-02-05 DEATH — deceased

## 2024-02-26 ENCOUNTER — Ambulatory Visit: Admitting: Family Medicine

## 2024-09-23 ENCOUNTER — Encounter
